# Patient Record
Sex: Male | Born: 1950
Health system: Southern US, Community
[De-identification: ages and names within clinical notes are randomized; demographics above are authoritative.]

## PROBLEM LIST (undated history)

## (undated) DIAGNOSIS — M1990 Unspecified osteoarthritis, unspecified site: Secondary | ICD-10-CM

## (undated) DIAGNOSIS — I471 Supraventricular tachycardia, unspecified: Secondary | ICD-10-CM

## (undated) DIAGNOSIS — L570 Actinic keratosis: Secondary | ICD-10-CM

## (undated) DIAGNOSIS — K219 Gastro-esophageal reflux disease without esophagitis: Secondary | ICD-10-CM

## (undated) DIAGNOSIS — E119 Type 2 diabetes mellitus without complications: Secondary | ICD-10-CM

## (undated) DIAGNOSIS — I2510 Atherosclerotic heart disease of native coronary artery without angina pectoris: Secondary | ICD-10-CM

## (undated) DIAGNOSIS — I499 Cardiac arrhythmia, unspecified: Secondary | ICD-10-CM

## (undated) DIAGNOSIS — D649 Anemia, unspecified: Secondary | ICD-10-CM

## (undated) DIAGNOSIS — E042 Nontoxic multinodular goiter: Secondary | ICD-10-CM

## (undated) DIAGNOSIS — G4730 Sleep apnea, unspecified: Secondary | ICD-10-CM

## (undated) DIAGNOSIS — I00 Rheumatic fever without heart involvement: Secondary | ICD-10-CM

## (undated) DIAGNOSIS — E041 Nontoxic single thyroid nodule: Secondary | ICD-10-CM

## (undated) DIAGNOSIS — T7840XA Allergy, unspecified, initial encounter: Secondary | ICD-10-CM

## (undated) DIAGNOSIS — C4491 Basal cell carcinoma of skin, unspecified: Secondary | ICD-10-CM

## (undated) DIAGNOSIS — T8859XA Other complications of anesthesia, initial encounter: Secondary | ICD-10-CM

## (undated) DIAGNOSIS — K760 Fatty (change of) liver, not elsewhere classified: Secondary | ICD-10-CM

## (undated) DIAGNOSIS — E785 Hyperlipidemia, unspecified: Secondary | ICD-10-CM

## (undated) DIAGNOSIS — T4145XA Adverse effect of unspecified anesthetic, initial encounter: Secondary | ICD-10-CM

## (undated) DIAGNOSIS — K819 Cholecystitis, unspecified: Secondary | ICD-10-CM

## (undated) DIAGNOSIS — C801 Malignant (primary) neoplasm, unspecified: Secondary | ICD-10-CM

## (undated) HISTORY — PX: CHOLECYSTECTOMY: SHX55

## (undated) HISTORY — PX: HERNIA REPAIR: SHX51

## (undated) HISTORY — DX: Actinic keratosis: L57.0

## (undated) HISTORY — DX: Allergy, unspecified, initial encounter: T78.40XA

## (undated) HISTORY — DX: Fatty (change of) liver, not elsewhere classified: K76.0

## (undated) HISTORY — DX: Type 2 diabetes mellitus without complications: E11.9

## (undated) HISTORY — DX: Cholecystitis, unspecified: K81.9

## (undated) HISTORY — DX: Rheumatic fever without heart involvement: I00

## (undated) HISTORY — DX: Atherosclerotic heart disease of native coronary artery without angina pectoris: I25.10

## (undated) HISTORY — DX: Sleep apnea, unspecified: G47.30

## (undated) HISTORY — DX: Gastro-esophageal reflux disease without esophagitis: K21.9

## (undated) HISTORY — DX: Hyperlipidemia, unspecified: E78.5

## (undated) HISTORY — PX: SHOULDER SURGERY: SHX246

## (undated) HISTORY — PX: SPINE SURGERY: SHX786

## (undated) HISTORY — DX: Basal cell carcinoma of skin, unspecified: C44.91

## (undated) HISTORY — DX: Nontoxic multinodular goiter: E04.2

---
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
## 1979-10-27 HISTORY — PX: VASECTOMY: SHX75

## 1995-10-27 DIAGNOSIS — G4730 Sleep apnea, unspecified: Secondary | ICD-10-CM

## 1995-10-27 HISTORY — PX: RHINOPLASTY: SUR1284

## 1995-10-27 HISTORY — DX: Sleep apnea, unspecified: G47.30

## 2013-10-26 HISTORY — PX: LUMBAR FUSION: SHX111

## 2014-10-26 HISTORY — PX: COLONOSCOPY: SHX174

## 2015-05-27 HISTORY — PX: BICEPS TENDON REPAIR: SHX566

## 2015-10-17 LAB — HM COLONOSCOPY

## 2016-05-26 ENCOUNTER — Ambulatory Visit (INDEPENDENT_AMBULATORY_CARE_PROVIDER_SITE_OTHER): Payer: BLUE CROSS/BLUE SHIELD | Admitting: Family Medicine

## 2016-05-26 ENCOUNTER — Encounter: Payer: Self-pay | Admitting: Family Medicine

## 2016-05-26 VITALS — BP 132/79 | HR 64 | Temp 98.7°F | Ht 73.0 in | Wt 227.8 lb

## 2016-05-26 DIAGNOSIS — R7989 Other specified abnormal findings of blood chemistry: Secondary | ICD-10-CM | POA: Diagnosis not present

## 2016-05-26 DIAGNOSIS — Z125 Encounter for screening for malignant neoplasm of prostate: Secondary | ICD-10-CM

## 2016-05-26 DIAGNOSIS — G4733 Obstructive sleep apnea (adult) (pediatric): Secondary | ICD-10-CM

## 2016-05-26 DIAGNOSIS — Z130 Encounter for screening for diseases of the blood and blood-forming organs and certain disorders involving the immune mechanism: Secondary | ICD-10-CM | POA: Diagnosis not present

## 2016-05-26 DIAGNOSIS — E1142 Type 2 diabetes mellitus with diabetic polyneuropathy: Secondary | ICD-10-CM | POA: Diagnosis not present

## 2016-05-26 DIAGNOSIS — E785 Hyperlipidemia, unspecified: Secondary | ICD-10-CM

## 2016-05-26 DIAGNOSIS — I471 Supraventricular tachycardia: Secondary | ICD-10-CM

## 2016-05-26 DIAGNOSIS — K219 Gastro-esophageal reflux disease without esophagitis: Secondary | ICD-10-CM

## 2016-05-26 DIAGNOSIS — K429 Umbilical hernia without obstruction or gangrene: Secondary | ICD-10-CM

## 2016-05-26 NOTE — Patient Instructions (Signed)
Continue your current medications.  Follow up in 6 months.  Take care  Dr. Lacinda Axon  Health Maintenance, Male A healthy lifestyle and preventative care can promote health and wellness.  Maintain regular health, dental, and eye exams.  Eat a healthy diet. Foods like vegetables, fruits, whole grains, low-fat dairy products, and lean protein foods contain the nutrients you need and are low in calories. Decrease your intake of foods high in solid fats, added sugars, and salt. Get information about a proper diet from your health care provider, if necessary.  Regular physical exercise is one of the most important things you can do for your health. Most adults should get at least 150 minutes of moderate-intensity exercise (any activity that increases your heart rate and causes you to sweat) each week. In addition, most adults need muscle-strengthening exercises on 2 or more days a week.   Maintain a healthy weight. The body mass index (BMI) is a screening tool to identify possible weight problems. It provides an estimate of body fat based on height and weight. Your health care provider can find your BMI and can help you achieve or maintain a healthy weight. For males 20 years and older:  A BMI below 18.5 is considered underweight.  A BMI of 18.5 to 24.9 is normal.  A BMI of 25 to 29.9 is considered overweight.  A BMI of 30 and above is considered obese.  Maintain normal blood lipids and cholesterol by exercising and minimizing your intake of saturated fat. Eat a balanced diet with plenty of fruits and vegetables. Blood tests for lipids and cholesterol should begin at age 22 and be repeated every 5 years. If your lipid or cholesterol levels are high, you are over age 87, or you are at high risk for heart disease, you may need your cholesterol levels checked more frequently.Ongoing high lipid and cholesterol levels should be treated with medicines if diet and exercise are not working.  If you  smoke, find out from your health care provider how to quit. If you do not use tobacco, do not start.  Lung cancer screening is recommended for adults aged 72-80 years who are at high risk for developing lung cancer because of a history of smoking. A yearly low-dose CT scan of the lungs is recommended for people who have at least a 30-pack-year history of smoking and are current smokers or have quit within the past 15 years. A pack year of smoking is smoking an average of 1 pack of cigarettes a day for 1 year (for example, a 30-pack-year history of smoking could mean smoking 1 pack a day for 30 years or 2 packs a day for 15 years). Yearly screening should continue until the smoker has stopped smoking for at least 15 years. Yearly screening should be stopped for people who develop a health problem that would prevent them from having lung cancer treatment.  If you choose to drink alcohol, do not have more than 2 drinks per day. One drink is considered to be 12 oz (360 mL) of beer, 5 oz (150 mL) of wine, or 1.5 oz (45 mL) of liquor.  Avoid the use of street drugs. Do not share needles with anyone. Ask for help if you need support or instructions about stopping the use of drugs.  High blood pressure causes heart disease and increases the risk of stroke. High blood pressure is more likely to develop in:  People who have blood pressure in the end of the normal range (  100-139/85-89 mm Hg).  People who are overweight or obese.  People who are African American.  If you are 76-82 years of age, have your blood pressure checked every 3-5 years. If you are 30 years of age or older, have your blood pressure checked every year. You should have your blood pressure measured twice--once when you are at a hospital or clinic, and once when you are not at a hospital or clinic. Record the average of the two measurements. To check your blood pressure when you are not at a hospital or clinic, you can use:  An automated  blood pressure machine at a pharmacy.  A home blood pressure monitor.  If you are 49-84 years old, ask your health care provider if you should take aspirin to prevent heart disease.  Diabetes screening involves taking a blood sample to check your fasting blood sugar level. This should be done once every 3 years after age 42 if you are at a normal weight and without risk factors for diabetes. Testing should be considered at a younger age or be carried out more frequently if you are overweight and have at least 1 risk factor for diabetes.  Colorectal cancer can be detected and often prevented. Most routine colorectal cancer screening begins at the age of 63 and continues through age 37. However, your health care provider may recommend screening at an earlier age if you have risk factors for colon cancer. On a yearly basis, your health care provider may provide home test kits to check for hidden blood in the stool. A small camera at the end of a tube may be used to directly examine the colon (sigmoidoscopy or colonoscopy) to detect the earliest forms of colorectal cancer. Talk to your health care provider about this at age 65 when routine screening begins. A direct exam of the colon should be repeated every 5-10 years through age 63, unless early forms of precancerous polyps or small growths are found.  People who are at an increased risk for hepatitis B should be screened for this virus. You are considered at high risk for hepatitis B if:  You were born in a country where hepatitis B occurs often. Talk with your health care provider about which countries are considered high risk.  Your parents were born in a high-risk country and you have not received a shot to protect against hepatitis B (hepatitis B vaccine).  You have HIV or AIDS.  You use needles to inject street drugs.  You live with, or have sex with, someone who has hepatitis B.  You are a man who has sex with other men (MSM).  You get  hemodialysis treatment.  You take certain medicines for conditions like cancer, organ transplantation, and autoimmune conditions.  Hepatitis C blood testing is recommended for all people born from [birth year] through 1964 and any individual with known risk factors for hepatitis C.  Healthy men should no longer receive prostate-specific antigen (PSA) blood tests as part of routine cancer screening. Talk to your health care provider about prostate cancer screening.  Testicular cancer screening is not recommended for adolescents or adult males who have no symptoms. Screening includes self-exam, a health care provider exam, and other screening tests. Consult with your health care provider about any symptoms you have or any concerns you have about testicular cancer.  Practice safe sex. Use condoms and avoid high-risk sexual practices to reduce the spread of sexually transmitted infections (STIs).  You should be screened for STIs, including  gonorrhea and chlamydia if:  You are sexually active and are younger than 24 years.  You are older than 24 years, and your health care provider tells you that you are at risk for this type of infection.  Your sexual activity has changed since you were last screened, and you are at an increased risk for chlamydia or gonorrhea. Ask your health care provider if you are at risk.  If you are at risk of being infected with HIV, it is recommended that you take a prescription medicine daily to prevent HIV infection. This is called pre-exposure prophylaxis (PrEP). You are considered at risk if:  You are a man who has sex with other men (MSM).  You are a heterosexual man who is sexually active with multiple partners.  You take drugs by injection.  You are sexually active with a partner who has HIV.  Talk with your health care provider about whether you are at high risk of being infected with HIV. If you choose to begin PrEP, you should first be tested for HIV. You should  then be tested every 3 months for as long as you are taking PrEP.  Use sunscreen. Apply sunscreen liberally and repeatedly throughout the day. You should seek shade when your shadow is shorter than you. Protect yourself by wearing long sleeves, pants, a wide-brimmed hat, and sunglasses year round whenever you are outdoors.  Tell your health care provider of new moles or changes in moles, especially if there is a change in shape or color. Also, tell your health care provider if a mole is larger than the size of a pencil eraser.  A one-time screening for abdominal aortic aneurysm (AAA) and surgical repair of large AAAs by ultrasound is recommended for men aged 15-75 years who are current or former smokers.  Stay current with your vaccines (immunizations).   This information is not intended to replace advice given to you by your health care provider. Make sure you discuss any questions you have with your health care provider.   Document Released: 04/09/2008 Document Revised: 11/02/2014 Document Reviewed: 03/09/2011 Elsevier Interactive Patient Education [employer].

## 2016-05-26 NOTE — Progress Notes (Signed)
Pre visit review using our clinic review tool, if applicable. No additional management support is needed unless otherwise documented below in the visit note. 

## 2016-05-27 ENCOUNTER — Encounter: Payer: Self-pay | Admitting: Family Medicine

## 2016-05-27 DIAGNOSIS — G4733 Obstructive sleep apnea (adult) (pediatric): Secondary | ICD-10-CM | POA: Insufficient documentation

## 2016-05-27 DIAGNOSIS — I471 Supraventricular tachycardia, unspecified: Secondary | ICD-10-CM

## 2016-05-27 DIAGNOSIS — K219 Gastro-esophageal reflux disease without esophagitis: Secondary | ICD-10-CM | POA: Insufficient documentation

## 2016-05-27 DIAGNOSIS — E1142 Type 2 diabetes mellitus with diabetic polyneuropathy: Secondary | ICD-10-CM | POA: Insufficient documentation

## 2016-05-27 DIAGNOSIS — K429 Umbilical hernia without obstruction or gangrene: Secondary | ICD-10-CM | POA: Insufficient documentation

## 2016-05-27 HISTORY — DX: Supraventricular tachycardia, unspecified: I47.10

## 2016-05-27 LAB — LIPID PANEL
Cholesterol: 206 mg/dL — ABNORMAL HIGH (ref 0–200)
HDL: 30.5 mg/dL — ABNORMAL LOW (ref >39.00)
NonHDL: 175.95
Total CHOL/HDL Ratio: 7
Triglycerides: 280 mg/dL — ABNORMAL HIGH (ref 0.0–149.0)
VLDL: 56 mg/dL — ABNORMAL HIGH (ref 0.0–40.0)

## 2016-05-27 LAB — COMPREHENSIVE METABOLIC PANEL
ALT: 26 U/L (ref 0–53)
AST: 20 U/L (ref 0–37)
Albumin: 4.3 g/dL (ref 3.5–5.2)
Alkaline Phosphatase: 46 U/L (ref 39–117)
BUN: 15 mg/dL (ref 6–23)
CO2: 28 mEq/L (ref 19–32)
Calcium: 9.7 mg/dL (ref 8.4–10.5)
Chloride: 101 mEq/L (ref 96–112)
Creatinine, Ser: 1.05 mg/dL (ref 0.40–1.50)
GFR: 75.63 mL/min (ref >60.00)
Glucose, Bld: 161 mg/dL — ABNORMAL HIGH (ref 70–99)
Potassium: 4.1 mEq/L (ref 3.5–5.1)
Sodium: 137 mEq/L (ref 135–145)
Total Bilirubin: 0.6 mg/dL (ref 0.2–1.2)
Total Protein: 6.8 g/dL (ref 6.0–8.3)

## 2016-05-27 LAB — CBC
HCT: 44.3 % (ref 39.0–52.0)
Hemoglobin: 14.9 g/dL (ref 13.0–17.0)
MCHC: 33.7 g/dL (ref 30.0–36.0)
MCV: 88.7 fl (ref 78.0–100.0)
Platelets: 209 10*3/uL (ref 150.0–400.0)
RBC: 4.99 Mil/uL (ref 4.22–5.81)
RDW: 14.1 % (ref 11.5–15.5)
WBC: 8.3 10*3/uL (ref 4.0–10.5)

## 2016-05-27 LAB — HEMOGLOBIN A1C: Hgb A1c MFr Bld: 6.7 % — ABNORMAL HIGH (ref 4.6–6.5)

## 2016-05-27 LAB — LDL CHOLESTEROL, DIRECT: Direct LDL: 159 mg/dL

## 2016-05-27 LAB — PSA: PSA: 3.41 ng/mL (ref 0.10–4.00)

## 2016-05-27 NOTE — Assessment & Plan Note (Signed)
Currently not having any issues. Stable on metoprolol. Continue.

## 2016-05-27 NOTE — Assessment & Plan Note (Signed)
Ventral and Umbilical hernias noted on exam. Offered referral to gen surgery for consideration for repair. Patient to discuss with wife and let me know.

## 2016-05-27 NOTE — Assessment & Plan Note (Signed)
Stable per report. A1c today. Continue metformin.

## 2016-05-27 NOTE — Assessment & Plan Note (Signed)
Stable on Nexium.  Continue. 

## 2016-05-27 NOTE — Assessment & Plan Note (Signed)
Stable on Provigil. Continue.

## 2016-05-27 NOTE — Progress Notes (Signed)
Subjective:  Patient ID: Jackson [name], male    DOB: 03-22-1951  Age: 65 y.o. MRN: [id number]  CC: Establish care  HPI Jackson [name] is a 65 y.o. male presents to the clinic today to establish care. Concerns/issues are below.  DM-2  Stable. Most recent A1C (4 months ago) was 7.0.  Hypoglycemia - No.  Medications - Metformin 500 mg BID.  Adverse effects - None.  Compliance - Yes.  Preventative care  Eye exam - Up to date (02/2016).  Foot exam - In need of today.  Last A1C - See above.  Urine microalbumin - Need records.   Hx of SVT  Stable on Metoprolol.   GERD  Stable on Nexium.  OSA  Did not tolerate CPAP.  Currently on Provigil and doing well.  Hernia  Patient reports ventral/umbilical hernia.   Would like me to take a look today.  Considering elective repair.  PMH, Surgical Hx, Family Hx, Social History reviewed and updated as below.  Past Medical History:  Diagnosis Date  . Allergy   . Diabetes mellitus without complication ([place])   . GERD (gastroesophageal reflux disease)   . Hyperlipidemia   . Rheumatic fever    Past Surgical History:  Procedure Laterality Date  . APPENDECTOMY  1965  . BICEPS TENDON REPAIR Right 05/2015  . LUMBAR FUSION  2015   L4/L5  . RHINOPLASTY  1997  . TONSILLECTOMY  1956  . VASECTOMY  1981   Family History  Problem Relation Age of Onset  . Arthritis Mother   . Lung cancer Mother   . Hyperlipidemia Mother   . Diabetes Mother   . Arthritis Father   . Diabetes Father   . Lung disease Father   . Hyperlipidemia Maternal Grandmother   . Alcohol abuse Maternal Grandfather   . Hyperlipidemia Maternal Grandfather   . Drug abuse Cousin    Social History  Substance Use Topics  . Smoking status: Never Smoker  . Smokeless tobacco: Former Systems developer  . Alcohol use Yes   Review of Systems  Respiratory: Positive for cough.   Gastrointestinal:       Hernia.  All other systems reviewed and are negative.  Objective:    Today's Vitals: BP 132/79 (BP Location: Left Arm, Patient Position: Sitting, Cuff Size: Normal)   Pulse 64   Temp 98.7 F (37.1 C) (Oral)   Ht '6\' 1"'  (1.854 m)   Wt 227 lb 12 oz (103.3 kg)   SpO2 95%   BMI 30.05 kg/m   Physical Exam  Constitutional: He is oriented to person, place, and time. He appears well-developed and well-nourished. No distress.  HENT:  Head: Normocephalic and atraumatic.  Nose: Nose normal.  Mouth/Throat: Oropharynx is clear and moist. No oropharyngeal exudate.  Normal TM's bilaterally.   Eyes: Conjunctivae are normal. No scleral icterus.  Neck: Neck supple.  Cardiovascular: Normal rate and regular rhythm.   No murmur heard. Pulmonary/Chest: Effort normal and breath sounds normal. He has no wheezes. He has no rales.  Abdominal: Soft. He exhibits no distension. There is no tenderness.  Ventral and umbilical hernias noted.  Musculoskeletal: Normal range of motion. He exhibits no edema.  Lymphadenopathy:    He has no cervical adenopathy.  Neurological: He is alert and oriented to person, place, and time.  Skin: Skin is warm and dry. No rash noted.  Psychiatric: He has a normal mood and affect.  Vitals reviewed.  Assessment & Plan:   Problem List Items Addressed This Visit  DM type 2 with diabetic peripheral neuropathy (HCC) - Primary    Stable per report. A1c today. Continue metformin.      Relevant Medications   metFORMIN (GLUCOPHAGE) 500 MG tablet   modafinil (PROVIGIL) 200 MG tablet   Other Relevant Orders   Comp Met (CMET)   HgB A1c   Paroxysmal SVT (supraventricular tachycardia) (HCC)    Currently not having any issues. Stable on metoprolol. Continue.      Relevant Medications   metoprolol succinate (TOPROL-XL) 50 MG 24 hr tablet   GERD (gastroesophageal reflux disease)    Stable on Nexium. Continue.       Relevant Medications   esomeprazole (NEXIUM) 40 MG capsule   OSA (obstructive sleep apnea)    Stable on Provigil.  Continue.      Umbilical hernia    Ventral and Umbilical hernias noted on exam. Offered referral to gen surgery for consideration for repair. Patient to discuss with wife and let me know.       Other Visit Diagnoses    Screening for deficiency anemia       Relevant Orders   CBC   Hyperlipidemia       Relevant Medications   metoprolol succinate (TOPROL-XL) 50 MG 24 hr tablet   Other Relevant Orders   Lipid Profile   Screening for prostate cancer       Relevant Orders   PSA      Outpatient Encounter Prescriptions as of 05/26/2016  Medication Sig  . AZELASTINE & FLUTICASONE NA Place 137 mcg into the nose as needed.  . cetirizine (ZYRTEC) 10 MG tablet Take 10 mg by mouth daily.  . Coenzyme Q10 (COQ10) 200 MG CAPS Take 200 mg by mouth.  . esomeprazole (NEXIUM) 40 MG capsule Take by mouth.  . metFORMIN (GLUCOPHAGE) 500 MG tablet Take by mouth.  . metoprolol succinate (TOPROL-XL) 50 MG 24 hr tablet Take by mouth.  . modafinil (PROVIGIL) 200 MG tablet Take 200 mg by mouth as needed.  . Multiple Vitamins-Minerals (MULTIVITAMIN ADULT PO) Take by mouth.  Marland Kitchen OVER THE COUNTER MEDICATION Osteo biflex  . vitamin C (ASCORBIC ACID) 500 MG tablet Take 500 mg by mouth daily.  . vitamin E 100 UNIT capsule Take 500 Units by mouth daily.  . [DISCONTINUED] AZELASTINE HCL OP Apply 137 mcg to eye.   No facility-administered encounter medications on file as of 05/26/2016.     Follow-up: TBD based on Lab results.  [place]

## 2016-06-19 ENCOUNTER — Encounter: Payer: Self-pay | Admitting: Family Medicine

## 2016-06-20 ENCOUNTER — Other Ambulatory Visit: Payer: Self-pay | Admitting: Family Medicine

## 2016-06-20 DIAGNOSIS — K429 Umbilical hernia without obstruction or gangrene: Secondary | ICD-10-CM

## 2016-06-22 ENCOUNTER — Encounter: Payer: Self-pay | Admitting: General Surgery

## 2016-07-06 ENCOUNTER — Other Ambulatory Visit: Payer: Self-pay | Admitting: Family Medicine

## 2016-07-06 ENCOUNTER — Ambulatory Visit (INDEPENDENT_AMBULATORY_CARE_PROVIDER_SITE_OTHER): Payer: BLUE CROSS/BLUE SHIELD | Admitting: General Surgery

## 2016-07-06 ENCOUNTER — Encounter: Payer: Self-pay | Admitting: General Surgery

## 2016-07-06 ENCOUNTER — Encounter: Payer: Self-pay | Admitting: Family Medicine

## 2016-07-06 VITALS — BP 130/72 | HR 74 | Resp 12 | Ht 73.0 in | Wt 234.0 lb

## 2016-07-06 DIAGNOSIS — M6208 Separation of muscle (nontraumatic), other site: Secondary | ICD-10-CM | POA: Insufficient documentation

## 2016-07-06 DIAGNOSIS — K439 Ventral hernia without obstruction or gangrene: Secondary | ICD-10-CM | POA: Insufficient documentation

## 2016-07-06 DIAGNOSIS — K429 Umbilical hernia without obstruction or gangrene: Secondary | ICD-10-CM

## 2016-07-06 MED ORDER — MODAFINIL 200 MG PO TABS: 200.0000 mg | ORAL_TABLET | Freq: Every day | ORAL | 0 refills | Status: DC

## 2016-07-06 NOTE — Patient Instructions (Addendum)
Hernia, Adult A hernia is the bulging of an organ or tissue through a weak spot in the muscles of the abdomen (abdominal wall). Hernias develop most often near the navel or groin. There are many kinds of hernias. Common kinds include:  Femoral hernia. This kind of hernia develops under the groin in the upper thigh area.  Inguinal hernia. This kind of hernia develops in the groin or scrotum.  Umbilical hernia. This kind of hernia develops near the navel.  Hiatal hernia. This kind of hernia causes part of the stomach to be pushed up into the chest.  Incisional hernia. This kind of hernia bulges through a scar from an abdominal surgery. CAUSES This condition may be caused by:  Heavy lifting.  Coughing over a long period of time.  Straining to have a bowel movement.  An incision made during an abdominal surgery.  A birth defect (congenital defect).  Excess weight or obesity.  Smoking.  Poor nutrition.  Cystic fibrosis.  Excess fluid in the abdomen.  Undescended testicles. SYMPTOMS Symptoms of a hernia include:  A lump on the abdomen. This is the first sign of a hernia. The lump may become more obvious with standing, straining, or coughing. It may get bigger over time if it is not treated or if the condition causing it is not treated.  Pain. A hernia is usually painless, but it may become painful over time if treatment is delayed. The pain is usually dull and may get worse with standing or lifting heavy objects. Sometimes a hernia gets tightly squeezed in the weak spot (strangulated) or stuck there (incarcerated) and causes additional symptoms. These symptoms may include:  Vomiting.  Nausea.  Constipation.  Irritability. DIAGNOSIS A hernia may be diagnosed with:  A physical exam. During the exam your health care provider may ask you to cough or to make a specific movement, because a hernia is usually more visible when you move.  Imaging tests. These can  include:  X-rays.  Ultrasound.  CT scan. TREATMENT A hernia that is small and painless may not need to be treated. A hernia that is large or painful may be treated with surgery. Inguinal hernias may be treated with surgery to prevent incarceration or strangulation. Strangulated hernias are always treated with surgery, because lack of blood to the trapped organ or tissue can cause it to die. Surgery to treat a hernia involves pushing the bulge back into place and repairing the weak part of the abdomen. HOME CARE INSTRUCTIONS  Avoid straining.  Do not lift anything heavier than 10 lb (4.5 kg).  Lift with your leg muscles, not your back muscles. This helps avoid strain.  When coughing, try to cough gently.  Prevent constipation. Constipation leads to straining with bowel movements, which can make a hernia worse or cause a hernia repair to break down. You can prevent constipation by:  Eating a high-fiber diet that includes plenty of fruits and vegetables.  Drinking enough fluids to keep your urine clear or pale yellow. Aim to drink 6-8 glasses of water per day.  Using a stool softener as directed by your health care provider.  Lose weight, if you are overweight.  Do not use any tobacco products, including cigarettes, chewing tobacco, or electronic cigarettes. If you need help quitting, ask your health care provider.  Keep all follow-up visits as directed by your health care provider. This is important. Your health care provider may need to monitor your condition. SEEK MEDICAL CARE IF:  You have   swelling, redness, and pain in the affected area.  Your bowel habits change. SEEK IMMEDIATE MEDICAL CARE IF:  You have a fever.  You have abdominal pain that is getting worse.  You feel nauseous or you vomit.  You cannot push the hernia back in place by gently pressing on it while you are lying down.  The hernia:  Changes in shape or size.  Is stuck outside the  abdomen.  Becomes discolored.  Feels hard or tender.   This information is not intended to replace advice given to you by your health care provider. Make sure you discuss any questions you have with your health care provider.   Document Released: 10/12/2005 Document Revised: 11/02/2014 Document Reviewed: 08/22/2014 Elsevier Interactive Patient Education [employer].  The patient is scheduled for surgery at [facility] on 07/30/16. He will pre admit by phone. The patient is aware of date and instructions.

## 2016-07-06 NOTE — Telephone Encounter (Signed)
Historical medication. Pt last seen 05/26/16. please advise?

## 2016-07-06 NOTE — Progress Notes (Signed)
Patient ID: Jackson [name], male   DOB: 1950/10/28, 65 y.o.   MRN: [id number]  Chief Complaint  Patient presents with  . Umbilical Hernia    HPI Jackson [name] is a 65 y.o. male here today for a evaluation of a umbilical hernia. Patient states he noticed this area about five or more years. No pain or change in size. Moves his bowels daily. Wife Elsie Amis present.  HPI  Past Medical History:  Diagnosis Date  . Allergy   . Diabetes mellitus without complication ([place])   . GERD (gastroesophageal reflux disease)   . Hyperlipidemia   . Rheumatic fever   . Sleep apnea 1997   Uses C-Pap machine    Past Surgical History:  Procedure Laterality Date  . APPENDECTOMY  1965  . BICEPS TENDON REPAIR Right 05/2015  . COLONOSCOPY  2016  . LUMBAR FUSION  2015   L4/L5  . RHINOPLASTY  1997  . TONSILLECTOMY  1956  . VASECTOMY  1981    Family History  Problem Relation Age of Onset  . Arthritis Mother   . Lung cancer Mother   . Hyperlipidemia Mother   . Diabetes Mother   . Arthritis Father   . Diabetes Father   . Lung disease Father   . Hyperlipidemia Maternal Grandmother   . Alcohol abuse Maternal Grandfather   . Hyperlipidemia Maternal Grandfather   . Drug abuse Cousin     Social History Social History  Substance Use Topics  . Smoking status: Never Smoker  . Smokeless tobacco: Former Systems developer  . Alcohol use Yes    No Known Allergies  Current Outpatient Prescriptions  Medication Sig Dispense Refill  . AZELASTINE & FLUTICASONE NA Place 137 mcg into the nose as needed.    . cetirizine (ZYRTEC) 10 MG tablet Take 10 mg by mouth daily.    . Coenzyme Q10 (COQ10) 200 MG CAPS Take 200 mg by mouth.    . esomeprazole (NEXIUM) 40 MG capsule Take by mouth.    . metFORMIN (GLUCOPHAGE) 500 MG tablet Take by mouth.    . metoprolol succinate (TOPROL-XL) 50 MG 24 hr tablet Take by mouth.    . modafinil (PROVIGIL) 200 MG tablet Take 200 mg by mouth as needed.    . Multiple Vitamins-Minerals  (MULTIVITAMIN ADULT PO) Take by mouth.    Marland Kitchen OVER THE COUNTER MEDICATION Osteo biflex    . vitamin C (ASCORBIC ACID) 500 MG tablet Take 500 mg by mouth daily.    . vitamin E 100 UNIT capsule Take 500 Units by mouth daily.     No current facility-administered medications for this visit.     Review of Systems Review of Systems  Constitutional: Negative.   Respiratory: Negative.   Cardiovascular: Negative.     Blood pressure 130/72, pulse 74, resp. rate 12, height 6\' 1"  (1.854 m), weight 234 lb (106.1 kg).  Physical Exam Physical Exam  Constitutional: He is oriented to person, place, and time. He appears well-developed and well-nourished.  Eyes: Conjunctivae are normal. No scleral icterus.  Neck: Neck supple.  Cardiovascular: Normal rate, regular rhythm and normal heart sounds.   Pulmonary/Chest: Effort normal and breath sounds normal.  Abdominal: Soft. Normal appearance and bowel sounds are normal. There is no hepatomegaly. There is no tenderness. A hernia ( ) is present.    3 cm soft tissue mass located 10 cm above the umbilicus.   Lymphadenopathy:    He has no cervical adenopathy.  Neurological: He is alert and  oriented to person, place, and time.  Skin: Skin is warm and dry.    Data Reviewed PCP notes of 05/26/2016.  Assessment    Umbilical hernia.  Epigastric hernia versus lipoma.  Diastases recti.    Plan    No indication for repair of diastases.   Hernia precautions and incarceration were discussed with the patient. If they develop symptoms of an incarcerated hernia, they were encouraged to seek prompt medical attention. I have recommended repair of the hernias on an outpatient basis in the near future. The risk of infection was reviewed. The role of prosthetic mesh to minimize the risk of recurrence was reviewed, anticipated only if a significantly larger defect is identified.   The patient is scheduled for surgery at [facility] on 07/30/16. He will pre admit by  phone. The patient is aware of date and instructions.   This information has been scribed by Gaspar Cola CMA.   Robert Bellow 07/06/2016, 4:52 PM

## 2016-07-07 NOTE — Telephone Encounter (Signed)
Faxed

## 2016-07-23 ENCOUNTER — Encounter
Admission: RE | Admit: 2016-07-23 | Discharge: 2016-07-23 | Disposition: A | Discharge status: Home / Self Care | Payer: BLUE CROSS/BLUE SHIELD | Source: Ambulatory Visit | Attending: General Surgery | Admitting: General Surgery

## 2016-07-23 HISTORY — DX: Other complications of anesthesia, initial encounter: T88.59XA

## 2016-07-23 HISTORY — DX: Anemia, unspecified: D64.9

## 2016-07-23 HISTORY — DX: Supraventricular tachycardia: I47.1

## 2016-07-23 HISTORY — DX: Adverse effect of unspecified anesthetic, initial encounter: T41.45XA

## 2016-07-23 HISTORY — DX: Supraventricular tachycardia, unspecified: I47.10

## 2016-07-23 HISTORY — DX: Nontoxic single thyroid nodule: E04.1

## 2016-07-23 HISTORY — DX: Malignant (primary) neoplasm, unspecified: C80.1

## 2016-07-23 HISTORY — DX: Cardiac arrhythmia, unspecified: I49.9

## 2016-07-23 HISTORY — DX: Unspecified osteoarthritis, unspecified site: M19.90

## 2016-07-23 NOTE — Patient Instructions (Signed)
  Your procedure is scheduled on: 07-30-16 (THURSDAY) Report to Same Day Surgery 2nd floor medical mall To find out your arrival time please call [phone] between 1PM - 3PM on 07-29-16 [facility])  Remember: Instructions that are not followed completely may result in serious medical risk, up to and including death, or upon the discretion of your surgeon and anesthesiologist your surgery may need to be rescheduled.    _x___ 1. Do not eat food or drink liquids after midnight. No gum chewing or hard candies.     __x__ 2. No Alcohol for 24 hours before or after surgery.   __x__3. No Smoking for 24 prior to surgery.   ____  4. Bring all medications with you on the day of surgery if instructed.    __x__ 5. Notify your doctor if there is any change in your medical condition     (cold, fever, infections).     Do not wear jewelry, make-up, hairpins, clips or nail polish.  Do not wear lotions, powders, or perfumes. You may wear deodorant.  Do not shave 48 hours prior to surgery. Men may shave face and neck.  Do not bring valuables to the hospital.    [facility] is not responsible for any belongings or valuables.               Contacts, dentures or bridgework may not be worn into surgery.  Leave your suitcase in the car. After surgery it may be brought to your room.  For patients admitted to the hospital, discharge time is determined by your treatment team.   Patients discharged the day of surgery will not be allowed to drive home.    Please read over the following fact sheets that you were given:   [facility] Preparing for Surgery and or MRSA Information   _x___ Take these medicines the morning of surgery with A SIP OF WATER:    1. TOPROL  2. [place]  3.  4.  5.  6.  ____Fleets enema or Magnesium Citrate as directed.   _x___ Use CHG Soap or sage wipes as directed on instruction sheet   ____ Use inhalers on the day of surgery and bring to hospital day of surgery  _X___ Stop  metformin 2 days prior to surgery-LAST DOSE ON Monday, October 2ND    ____ Take 1/2 of usual insulin dose the night before surgery and none on the morning of surgery.   ____ Stop aspirin or coumadin, or plavix  __ Stop Anti-inflammatories such as Advil, Aleve, Ibuprofen, Motrin, Naproxen,          Naprosyn, Goodies powders or aspirin products.Ok to take Tylenol.   _X___ Stop supplements until after surgery-STOP FISH OIL, CINNAMON, OSTEO-BI FLEX, VITAMIN C, E, AND CO Q 10 NOW  _X___ Bring C-Pap to the hospital.

## 2016-07-24 ENCOUNTER — Ambulatory Visit
Admission: RE | Admit: 2016-07-24 | Discharge: 2016-07-24 | Disposition: A | Discharge status: Home / Self Care | Payer: BLUE CROSS/BLUE SHIELD | Source: Ambulatory Visit | Attending: General Surgery | Admitting: General Surgery

## 2016-07-24 DIAGNOSIS — R001 Bradycardia, unspecified: Secondary | ICD-10-CM | POA: Insufficient documentation

## 2016-07-24 DIAGNOSIS — I471 Supraventricular tachycardia: Secondary | ICD-10-CM | POA: Diagnosis not present

## 2016-07-24 DIAGNOSIS — R9431 Abnormal electrocardiogram [ECG] [EKG]: Secondary | ICD-10-CM | POA: Insufficient documentation

## 2016-07-24 DIAGNOSIS — Z01818 Encounter for other preprocedural examination: Secondary | ICD-10-CM | POA: Insufficient documentation

## 2016-07-24 NOTE — Pre-Procedure Instructions (Signed)
CALLED DR Kayleen Memos REGARDING PTS ABNORMAL EKG-INFORMED DR Kayleen Memos THAT PT JUST MOVED FROM [place] AND WAS SEEING CARDIOLOGIST IN [place] FOR HIS SVT-PT HAS NOT ESTABLISHED CARE WITH ANYONE HERE YET AND NOW PT HAS ABNORMAL EKG-INFORMED DR Kayleen Memos THAT PT STATES HE ALWAYS HAS ABNORMAL EKG WITH  INVERTED T WAVES. DR Park Meo PT TO CARDIAC CLEARANCE. CALLED CARYL-LYN AT DR BYRNETT'S OFFICE AND INFORMED HER OF THIS. I TOLD HER THAT SHE WILL HAVE TO SET CLEARANCE UP ON THIS PT SINCE HE HAS NEVER SEEN A CARDIOLOGIST BEFORE.  CARYL-LYN VERBALIZED SHE WOULD.FAXED THIS OVER TO DR BYRNETTS OFFICE.

## 2016-07-24 NOTE — Pre-Procedure Instructions (Signed)
RECEIVED FAX CONFIRMATION THAT CLEARANCE AND EKG WENT THRU TO DR Dwyane Luo OFFICE

## 2016-07-27 ENCOUNTER — Telehealth: Payer: Self-pay | Admitting: General Surgery

## 2016-07-27 NOTE — Telephone Encounter (Signed)
PT CAME INTO [facility] TODAY & STATED HE WANTED TO CANCEL HIS HERNIA SX [facility] FOR Thursday 07-30-16 WITH DR BYRNETT & DIDN'T WANT TO RESCHEDULE AT THIS TIME.

## 2016-07-27 NOTE — Telephone Encounter (Signed)
Patient is getting set up with cardiology as he has a history of SVT. He may reschedule once he has had all his testing and is clear for surgery.

## 2016-07-30 ENCOUNTER — Ambulatory Visit
Admission: RE | Admit: 2016-07-30 | Payer: BLUE CROSS/BLUE SHIELD | Source: Ambulatory Visit | Admitting: General Surgery

## 2016-07-30 ENCOUNTER — Encounter: Admission: RE | Payer: Self-pay | Source: Ambulatory Visit

## 2016-07-30 SURGERY — REPAIR, HERNIA, UMBILICAL, ADULT
Anesthesia: Choice

## 2016-08-03 ENCOUNTER — Encounter: Payer: Self-pay | Admitting: Family Medicine

## 2016-08-04 ENCOUNTER — Encounter: Payer: Self-pay | Admitting: Family Medicine

## 2016-08-04 LAB — HM DIABETES EYE EXAM

## 2016-09-03 ENCOUNTER — Encounter: Payer: Self-pay | Admitting: Family Medicine

## 2016-09-04 ENCOUNTER — Other Ambulatory Visit: Payer: Self-pay | Admitting: Family Medicine

## 2016-09-04 DIAGNOSIS — L821 Other seborrheic keratosis: Secondary | ICD-10-CM

## 2016-09-06 ENCOUNTER — Encounter: Payer: Self-pay | Admitting: Family Medicine

## 2016-09-07 ENCOUNTER — Encounter: Payer: Self-pay | Admitting: Family Medicine

## 2016-09-07 ENCOUNTER — Ambulatory Visit (INDEPENDENT_AMBULATORY_CARE_PROVIDER_SITE_OTHER): Payer: BLUE CROSS/BLUE SHIELD | Admitting: Family Medicine

## 2016-09-07 VITALS — BP 134/88 | HR 62 | Temp 98.3°F | Resp 16 | Wt 236.4 lb

## 2016-09-07 DIAGNOSIS — B9789 Other viral agents as the cause of diseases classified elsewhere: Secondary | ICD-10-CM

## 2016-09-07 DIAGNOSIS — J069 Acute upper respiratory infection, unspecified: Secondary | ICD-10-CM | POA: Insufficient documentation

## 2016-09-07 LAB — POCT RAPID STREP A (OFFICE): Rapid Strep A Screen: NEGATIVE

## 2016-09-07 NOTE — Progress Notes (Signed)
Pre visit review using our clinic review tool, if applicable. No additional management support is needed unless otherwise documented below in the visit note. 

## 2016-09-07 NOTE — Progress Notes (Signed)
   Subjective:  Patient ID: Jackson [name], male    DOB: 09/23/1951  Age: 65 y.o. MRN: [id number]  CC: Cough, congestion, Sore Throat  HPI:  65 year old male presents for an acute visit with the above complaints.  Patient reports that he's not been feeling well since Saturday. He's been experiencing cough, congestion, sore throat, and right ear pain. He took some Mucinex recently without improvement. No associated fevers or chills. No known exacerbating or relieving factors. No other associated symptoms. No other complaints at this time.  Social Hx   Social History   Social History  . Marital status: Married    Spouse name: N/A  . Number of children: N/A  . Years of education: N/A   Social History Main Topics  . Smoking status: Never Smoker  . Smokeless tobacco: Former Systems developer  . Alcohol use Yes     Comment: OCC  . Drug use: No  . Sexual activity: Not Currently   Other Topics Concern  . None   Social History Narrative  . None    Review of Systems  HENT: Positive for congestion, ear pain and sore throat.   Respiratory: Positive for cough.    Objective:  BP 134/88 (BP Location: Right Arm, Patient Position: Sitting, Cuff Size: Normal)   Pulse 62   Temp 98.3 F (36.8 C) (Oral)   Resp 16   Wt 236 lb 6 oz (107.2 kg)   SpO2 96%   BMI 31.19 kg/m   BP/Weight 09/07/2016 07/23/2016 0000000  Systolic BP Q000111Q - [id number]  Diastolic BP 88 - 72  Wt. (Lbs) 236.38 225 234  BMI 31.19 29.69 30.87   Physical Exam  Constitutional: He is oriented to person, place, and time. He appears well-developed. No distress.  HENT:  Oropharynx with mild erythema. Normal TMs bilaterally.  Cardiovascular: Normal rate and regular rhythm.   Pulmonary/Chest: Effort normal and breath sounds normal.  Neurological: He is alert and oriented to person, place, and time.  Psychiatric: He has a normal mood and affect.  Vitals reviewed.  Lab Results  Component Value Date   WBC 8.3 05/26/2016   HGB 14.9  05/26/2016   HCT 44.3 05/26/2016   PLT 209.0 05/26/2016   GLUCOSE 161 (H) 05/26/2016   CHOL 206 (H) 05/26/2016   TRIG 280.0 (H) 05/26/2016   HDL 30.50 (L) 05/26/2016   LDLDIRECT 159.0 05/26/2016   ALT 26 05/26/2016   AST 20 05/26/2016   NA 137 05/26/2016   K 4.1 05/26/2016   CL 101 05/26/2016   CREATININE 1.05 05/26/2016   BUN 15 05/26/2016   CO2 28 05/26/2016   PSA 3.41 05/26/2016   HGBA1C 6.7 (H) 05/26/2016   Assessment & Plan:   Problem List Items Addressed This Visit    Viral URI with cough - Primary    New acute problem. Appears to be viral in origin. No indication for antibiotics at this time. Advised supportive care and over-the-counter Mucinex, Tylenol, Flonase, Delsym.      Relevant Orders   Culture, Group A Strep   POCT rapid strep A (Completed)     Follow-up: PRN  [place]

## 2016-09-07 NOTE — Assessment & Plan Note (Signed)
New acute problem. Appears to be viral in origin. No indication for antibiotics at this time. Advised supportive care and over-the-counter Mucinex, Tylenol, Flonase, Delsym.

## 2016-09-07 NOTE — Patient Instructions (Signed)
This is viral.   Use mucinex, tylenol, flonase.  Rest and hydrate.  Follow up if you fail to improve or worsen.  Take care  Dr. Lacinda Axon

## 2016-09-09 LAB — CULTURE, GROUP A STREP: Organism ID, Bacteria: NORMAL

## 2016-11-26 ENCOUNTER — Encounter: Payer: Self-pay | Admitting: Family Medicine

## 2016-11-26 ENCOUNTER — Ambulatory Visit (INDEPENDENT_AMBULATORY_CARE_PROVIDER_SITE_OTHER): Admitting: Family Medicine

## 2016-11-26 VITALS — BP 126/83 | HR 65 | Temp 98.5°F | Wt 237.8 lb

## 2016-11-26 DIAGNOSIS — E1142 Type 2 diabetes mellitus with diabetic polyneuropathy: Secondary | ICD-10-CM

## 2016-11-26 DIAGNOSIS — E1169 Type 2 diabetes mellitus with other specified complication: Secondary | ICD-10-CM | POA: Insufficient documentation

## 2016-11-26 DIAGNOSIS — G4733 Obstructive sleep apnea (adult) (pediatric): Secondary | ICD-10-CM

## 2016-11-26 DIAGNOSIS — E785 Hyperlipidemia, unspecified: Secondary | ICD-10-CM

## 2016-11-26 LAB — LIPID PANEL
Cholesterol: 210 mg/dL — ABNORMAL HIGH (ref 0–200)
HDL: 27.4 mg/dL — ABNORMAL LOW (ref >39.00)
LDL Cholesterol: 148 mg/dL — ABNORMAL HIGH (ref 0–99)
NonHDL: 182.98
Total CHOL/HDL Ratio: 8
Triglycerides: 173 mg/dL — ABNORMAL HIGH (ref 0.0–149.0)
VLDL: 34.6 mg/dL (ref 0.0–40.0)

## 2016-11-26 LAB — HEMOGLOBIN A1C: Hgb A1c MFr Bld: 7.1 % — ABNORMAL HIGH (ref 4.6–6.5)

## 2016-11-26 MED ORDER — METFORMIN HCL 500 MG PO TABS: 500.0000 mg | ORAL_TABLET | Freq: Two times a day (BID) | ORAL | 3 refills | Status: DC

## 2016-11-26 MED ORDER — MODAFINIL 200 MG PO TABS: 200.0000 mg | ORAL_TABLET | Freq: Every day | ORAL | 0 refills | Status: DC

## 2016-11-26 MED ORDER — AZELASTINE-FLUTICASONE 137-50 MCG/ACT NA SUSP: 1.0000 | Freq: Two times a day (BID) | NASAL | 1 refills | Status: DC

## 2016-11-26 MED ORDER — ESOMEPRAZOLE MAGNESIUM 40 MG PO CPDR: 40.0000 mg | DELAYED_RELEASE_CAPSULE | Freq: Every evening | ORAL | 3 refills | Status: DC

## 2016-11-26 MED ORDER — METOPROLOL SUCCINATE ER 50 MG PO TB24: 50.0000 mg | ORAL_TABLET | Freq: Two times a day (BID) | ORAL | 3 refills | Status: DC

## 2016-11-26 NOTE — Assessment & Plan Note (Signed)
Stable. A1C today. Continue metformin. 

## 2016-11-26 NOTE — Assessment & Plan Note (Signed)
Stable on Provigil. Refilled today.

## 2016-11-26 NOTE — Assessment & Plan Note (Signed)
Uncontrolled. Patient has not tolerate statin. Patient states that he was on Zetia which I prescribed and that he had a repeat lipid panel. I am not aware of this repeat lab study. Will reassess today with lipid panel.

## 2016-11-26 NOTE — Progress Notes (Signed)
Subjective:  Patient ID: Jackson [name], male    DOB: [birth year]  Age: 66 y.o. MRN: [id number]  CC: Follow up  HPI:  66 year old male with DM 2, paroxysmal SVT, GERD, OSA, hyperlipidemia presents for follow-up.  DM-2  Stable.   Sugars well controlled on Metformin.  Hyperlipidemia  Uncontrolled and untreated.   Has not tolerated statin.  Was on Zetia and no longer taking.  OSA  Stable on Provigil.  Social Hx   Social History   Social History  . Marital status: Married    Spouse name: N/A  . Number of children: N/A  . Years of education: N/A   Social History Main Topics  . Smoking status: Never Smoker  . Smokeless tobacco: Former Systems developer  . Alcohol use Yes     Comment: OCC  . Drug use: No  . Sexual activity: Not Currently   Other Topics Concern  . None   Social History Narrative  . None   Review of Systems  Constitutional: Negative.   Respiratory: Negative.   Cardiovascular: Negative.    Objective:  BP 126/83   Pulse 65   Temp 98.5 F (36.9 C) (Oral)   Wt 237 lb 12.8 oz (107.9 kg)   SpO2 93%   BMI 31.37 kg/m   BP/Weight 11/26/2016 09/07/2016 [id number]  Systolic BP 123XX123 Q000111Q -  Diastolic BP 83 88 -  Wt. (Lbs) 237.8 236.38 225  BMI 31.37 31.19 29.69   Physical Exam  Constitutional: He is oriented to person, place, and time. He appears well-developed. No distress.  Cardiovascular: Normal rate and regular rhythm.   Pulmonary/Chest: Effort normal and breath sounds normal.  Neurological: He is alert and oriented to person, place, and time.  Psychiatric: He has a normal mood and affect.  Vitals reviewed.  Lab Results  Component Value Date   WBC 8.3 05/26/2016   HGB 14.9 05/26/2016   HCT 44.3 05/26/2016   PLT 209.0 05/26/2016   GLUCOSE 161 (H) 05/26/2016   CHOL 206 (H) 05/26/2016   TRIG 280.0 (H) 05/26/2016   HDL 30.50 (L) 05/26/2016   LDLDIRECT 159.0 05/26/2016   ALT 26 05/26/2016   AST 20 05/26/2016   NA 137 05/26/2016   K 4.1  05/26/2016   CL 101 05/26/2016   CREATININE 1.05 05/26/2016   BUN 15 05/26/2016   CO2 28 05/26/2016   PSA 3.41 05/26/2016   HGBA1C 6.7 (H) 05/26/2016    Assessment & Plan:   Problem List Items Addressed This Visit    DM type 2 with diabetic peripheral neuropathy ([place]) - Primary    Stable. A1C today. Continue metformin.      Relevant Medications   metFORMIN (GLUCOPHAGE) 500 MG tablet   modafinil (PROVIGIL) 200 MG tablet   Other Relevant Orders   Hemoglobin A1c   OSA (obstructive sleep apnea)    Stable on Provigil. Refilled today.      Hyperlipidemia    Uncontrolled. Patient has not tolerate statin. Patient states that he was on Zetia which I prescribed and that he had a repeat lipid panel. I am not aware of this repeat lab study. Will reassess today with lipid panel.      Relevant Medications   metoprolol succinate (TOPROL-XL) 50 MG 24 hr tablet   Other Relevant Orders   Lipid panel      Meds ordered this encounter  Medications  . Azelastine-Fluticasone 137-50 MCG/ACT SUSP    Sig: Place 1 spray into the nose 2 (two) times daily.  Dispense:  23 g    Refill:  1  . esomeprazole (NEXIUM) 40 MG capsule    Sig: Take 1 capsule (40 mg total) by mouth every evening.    Dispense:  90 capsule    Refill:  3  . metFORMIN (GLUCOPHAGE) 500 MG tablet    Sig: Take 1 tablet (500 mg total) by mouth 2 (two) times daily with a meal.    Dispense:  180 tablet    Refill:  3  . metoprolol succinate (TOPROL-XL) 50 MG 24 hr tablet    Sig: Take 1 tablet (50 mg total) by mouth 2 (two) times daily.    Dispense:  180 tablet    Refill:  3  . modafinil (PROVIGIL) 200 MG tablet    Sig: Take 1 tablet (200 mg total) by mouth daily.    Dispense:  90 tablet    Refill:  0    Follow-up: Return in about 6 months (around 05/26/2017).  [place]

## 2016-11-26 NOTE — Patient Instructions (Signed)
Continue your meds.  Follow up in 6 months.  Take care  Dr. Krystan Northrop  

## 2016-12-03 ENCOUNTER — Telehealth: Payer: Self-pay | Admitting: Family Medicine

## 2016-12-03 NOTE — Telephone Encounter (Signed)
A PA was done for pts nexium and dymista.

## 2016-12-03 NOTE — Telephone Encounter (Signed)
nexium was approved until 10/25/2098.

## 2016-12-06 ENCOUNTER — Encounter: Payer: Self-pay | Admitting: Family Medicine

## 2016-12-09 ENCOUNTER — Encounter: Payer: Self-pay | Admitting: Family Medicine

## 2016-12-09 NOTE — Telephone Encounter (Signed)
Dymista will not be approved via Pa paper request

## 2016-12-10 ENCOUNTER — Telehealth: Payer: Self-pay | Admitting: Family Medicine

## 2016-12-10 ENCOUNTER — Other Ambulatory Visit: Payer: Self-pay | Admitting: Family Medicine

## 2016-12-10 MED ORDER — METFORMIN HCL 1000 MG PO TABS: 1000.0000 mg | ORAL_TABLET | Freq: Two times a day (BID) | ORAL | 0 refills | Status: DC

## 2016-12-10 MED ORDER — METFORMIN HCL 1000 MG PO TABS: 1000.0000 mg | ORAL_TABLET | Freq: Two times a day (BID) | ORAL | 2 refills | Status: DC

## 2016-12-10 NOTE — Telephone Encounter (Signed)
Pt called and medication had to be resent to the corrected pharmacy.

## 2016-12-10 NOTE — Telephone Encounter (Signed)
Pt sent a MyChart message stating that he was okay with increase to 1000 mg BID of metformin. New rx will be sent to reflect this.

## 2016-12-10 NOTE — Telephone Encounter (Signed)
Pt called stating he needs a short order of the metFORMIN (GLUCOPHAGE) 1000 MG tablet until he gets the medication that was sent in today. Pt is out of the medication.  Pharmacy is [id number] - [place], [place]  Call pt @ [phone]. Thank you!

## 2016-12-10 NOTE — Addendum Note (Signed)
Addended by: Carmin Muskrat on: 12/10/2016 01:10 PM   Modules accepted: Orders

## 2016-12-30 ENCOUNTER — Encounter: Payer: Self-pay | Admitting: Family Medicine

## 2016-12-31 ENCOUNTER — Other Ambulatory Visit: Payer: Self-pay | Admitting: Family Medicine

## 2016-12-31 MED ORDER — AZELASTINE HCL 0.1 % NA SOLN: 1.0000 | Freq: Two times a day (BID) | NASAL | 12 refills | Status: DC

## 2016-12-31 MED ORDER — OMEPRAZOLE 40 MG PO CPDR: 40.0000 mg | DELAYED_RELEASE_CAPSULE | Freq: Every day | ORAL | 1 refills | Status: DC

## 2017-01-05 ENCOUNTER — Other Ambulatory Visit: Payer: Self-pay | Admitting: Family Medicine

## 2017-01-05 MED ORDER — OSELTAMIVIR PHOSPHATE 75 MG PO CAPS: 75.0000 mg | ORAL_CAPSULE | Freq: Every day | ORAL | 0 refills | Status: DC

## 2017-05-26 ENCOUNTER — Ambulatory Visit (INDEPENDENT_AMBULATORY_CARE_PROVIDER_SITE_OTHER): Admitting: Family Medicine

## 2017-05-26 ENCOUNTER — Encounter: Payer: Self-pay | Admitting: Family Medicine

## 2017-05-26 VITALS — BP 112/70 | HR 53 | Temp 98.9°F | Resp 12 | Wt 232.1 lb

## 2017-05-26 DIAGNOSIS — Z130 Encounter for screening for diseases of the blood and blood-forming organs and certain disorders involving the immune mechanism: Secondary | ICD-10-CM

## 2017-05-26 DIAGNOSIS — E1142 Type 2 diabetes mellitus with diabetic polyneuropathy: Secondary | ICD-10-CM | POA: Diagnosis not present

## 2017-05-26 DIAGNOSIS — K219 Gastro-esophageal reflux disease without esophagitis: Secondary | ICD-10-CM | POA: Diagnosis not present

## 2017-05-26 DIAGNOSIS — E785 Hyperlipidemia, unspecified: Secondary | ICD-10-CM

## 2017-05-26 DIAGNOSIS — Z125 Encounter for screening for malignant neoplasm of prostate: Secondary | ICD-10-CM | POA: Diagnosis not present

## 2017-05-26 LAB — PSA: PSA: 3.01 ng/mL (ref 0.10–4.00)

## 2017-05-26 LAB — CBC
HCT: 46.6 % (ref 39.0–52.0)
Hemoglobin: 15.6 g/dL (ref 13.0–17.0)
MCHC: 33.5 g/dL (ref 30.0–36.0)
MCV: 91.9 fl (ref 78.0–100.0)
Platelets: 217 10*3/uL (ref 150.0–400.0)
RBC: 5.07 Mil/uL (ref 4.22–5.81)
RDW: 13.6 % (ref 11.5–15.5)
WBC: 7.9 10*3/uL (ref 4.0–10.5)

## 2017-05-26 LAB — COMPREHENSIVE METABOLIC PANEL
ALT: 34 U/L (ref 0–53)
AST: 23 U/L (ref 0–37)
Albumin: 4.3 g/dL (ref 3.5–5.2)
Alkaline Phosphatase: 44 U/L (ref 39–117)
BUN: 16 mg/dL (ref 6–23)
CO2: 29 mEq/L (ref 19–32)
Calcium: 9.4 mg/dL (ref 8.4–10.5)
Chloride: 102 mEq/L (ref 96–112)
Creatinine, Ser: 1.12 mg/dL (ref 0.40–1.50)
GFR: 69.98 mL/min (ref >60.00)
Glucose, Bld: 132 mg/dL — ABNORMAL HIGH (ref 70–99)
Potassium: 4.8 mEq/L (ref 3.5–5.1)
Sodium: 138 mEq/L (ref 135–145)
Total Bilirubin: 0.7 mg/dL (ref 0.2–1.2)
Total Protein: 6.5 g/dL (ref 6.0–8.3)

## 2017-05-26 LAB — POCT URINALYSIS DIPSTICK
Bilirubin, UA: NEGATIVE
Blood, UA: NEGATIVE
Glucose, UA: NEGATIVE
Ketones, UA: NEGATIVE
Leukocytes, UA: NEGATIVE
Nitrite, UA: NEGATIVE
Protein, UA: NEGATIVE
Spec Grav, UA: 1.02 (ref 1.010–1.025)
Urobilinogen, UA: 0.2 E.U./dL
pH, UA: 5.5 (ref 5.0–8.0)

## 2017-05-26 LAB — MICROALBUMIN / CREATININE URINE RATIO
Creatinine,U: 137.5 mg/dL
Microalb Creat Ratio: 0.4 mg/g (ref 0.0–30.0)
Microalb, Ur: 0.6 mg/dL (ref 0.0–1.9)

## 2017-05-26 LAB — HEMOGLOBIN A1C: Hgb A1c MFr Bld: 6.8 % — ABNORMAL HIGH (ref 4.6–6.5)

## 2017-05-26 MED ORDER — OMEPRAZOLE 40 MG PO CPDR: 40.0000 mg | DELAYED_RELEASE_CAPSULE | Freq: Every day | ORAL | 1 refills | Status: DC

## 2017-05-26 MED ORDER — AZELASTINE HCL 0.1 % NA SOLN: 1.0000 | Freq: Two times a day (BID) | NASAL | 12 refills | Status: DC

## 2017-05-26 NOTE — Assessment & Plan Note (Signed)
Stable. Refilling omeprazole.

## 2017-05-26 NOTE — Assessment & Plan Note (Signed)
Uncontrolled. Lipid panel today. Patient cannot tolerate statin. Discussed restarting Zetia. Patient to consider. Patient states that he's not worried about his cholesterol.

## 2017-05-26 NOTE — Progress Notes (Signed)
Subjective:  Patient ID: Jackson [name], male    DOB: Jun 01, 1951  Age: 66 y.o. MRN: [id number]  CC: Follow up  HPI:  66 year old male with OSA, GERD, DM 2, hyperlipidemia, history of paroxysmal SVT presents for follow-up.  DM-2  Stable.  Sugars ranging from 110's - 130, with some outliers.  Compliant with metformin.  No adverse side effects.  Hyperlipidemia  Uncontrolled. Cannot tolerate statin secondary to myalgia.  Needs lipid panel today.  GERD  Stable on PPI. Needs refill on omeprazole.  Social Hx   Social History   Social History  . Marital status: Married    Spouse name: N/A  . Number of children: N/A  . Years of education: N/A   Social History Main Topics  . Smoking status: Never Smoker  . Smokeless tobacco: Former Systems developer  . Alcohol use Yes     Comment: OCC  . Drug use: No  . Sexual activity: Not Currently   Other Topics Concern  . None   Social History Narrative  . None    Review of Systems  Respiratory: Negative.   Cardiovascular: Negative.    Objective:  BP 112/70 (BP Location: Left Arm, Patient Position: Sitting, Cuff Size: Normal)   Pulse (!) 53   Temp 98.9 F (37.2 C) (Oral)   Resp 12   Wt 232 lb 2 oz (105.3 kg)   SpO2 95%   BMI 30.63 kg/m   BP/Weight 05/26/2017 11/26/2016 54/27/0623  Systolic BP 762 831 517  Diastolic BP 70 83 88  Wt. (Lbs) 232.13 237.8 236.38  BMI 30.63 31.37 31.19    Physical Exam  Constitutional: He is oriented to person, place, and time. He appears well-developed. No distress.  Cardiovascular: Normal rate and regular rhythm.   Pulmonary/Chest: Effort normal and breath sounds normal. He has no wheezes. He has no rales.  Abdominal: Soft. He exhibits no distension. There is no tenderness.  Neurological: He is alert and oriented to person, place, and time.  Psychiatric: He has a normal mood and affect.  Vitals reviewed.   Lab Results  Component Value Date   WBC 8.3 05/26/2016   HGB 14.9 05/26/2016   HCT 44.3 05/26/2016   PLT 209.0 05/26/2016   GLUCOSE 161 (H) 05/26/2016   CHOL 210 (H) 11/26/2016   TRIG 173.0 (H) 11/26/2016   HDL 27.40 (L) 11/26/2016   LDLDIRECT 159.0 05/26/2016   LDLCALC 148 (H) 11/26/2016   ALT 26 05/26/2016   AST 20 05/26/2016   NA 137 05/26/2016   K 4.1 05/26/2016   CL 101 05/26/2016   CREATININE 1.05 05/26/2016   BUN 15 05/26/2016   CO2 28 05/26/2016   PSA 3.41 05/26/2016   HGBA1C 7.1 (H) 11/26/2016    Assessment & Plan:   Problem List Items Addressed This Visit      Digestive   GERD (gastroesophageal reflux disease)    Stable. Refilling omeprazole.      Relevant Medications   omeprazole (PRILOSEC) 40 MG capsule     Endocrine   DM type 2 with diabetic peripheral neuropathy ([place]) - Primary    Stable. A1c today. Continue metformin.      Relevant Orders   Hemoglobin A1c   Comprehensive metabolic panel   Microalbumin / creatinine urine ratio   POCT Urinalysis Dipstick (Completed)     Other   Hyperlipidemia    Uncontrolled. Lipid panel today. Patient cannot tolerate statin. Discussed restarting Zetia. Patient to consider. Patient states that he's not worried about his  cholesterol.       Other Visit Diagnoses    Screening for deficiency anemia       Relevant Orders   CBC   Prostate cancer screening       Relevant Orders   PSA      Meds ordered this encounter  Medications  . azelastine (ASTELIN) 0.1 % nasal spray    Sig: Place 1 spray into both nostrils 2 (two) times daily. Use in each nostril as directed    Dispense:  30 mL    Refill:  12  . omeprazole (PRILOSEC) 40 MG capsule    Sig: Take 1 capsule (40 mg total) by mouth daily.    Dispense:  90 capsule    Refill:  1     Follow-up: 6 months  [place]

## 2017-05-26 NOTE — Patient Instructions (Signed)
Continue your meds.  Follow up in 6 months.  Take care  Dr. Murl Zogg  

## 2017-05-26 NOTE — Assessment & Plan Note (Signed)
Stable. A1c today. Continue metformin.

## 2017-05-27 ENCOUNTER — Ambulatory Visit: Admitting: Family Medicine

## 2017-08-20 ENCOUNTER — Other Ambulatory Visit: Payer: Self-pay | Admitting: Family Medicine

## 2017-10-20 LAB — HM DIABETES EYE EXAM

## 2017-10-22 ENCOUNTER — Encounter: Payer: Self-pay | Admitting: Family Medicine

## 2018-01-07 ENCOUNTER — Other Ambulatory Visit: Payer: Self-pay | Admitting: Family Medicine

## 2018-01-10 NOTE — Telephone Encounter (Signed)
23-month refill sent to pharmacy.  Please contact him and get him set up to establish care with Dr. Terese Door if he is willing as I am no longer accepting transfers from Dr. Lacinda Axon.

## 2018-01-10 NOTE — Telephone Encounter (Signed)
Last seen in August by Dr. Lacinda Axon. No future has been made to transfer care since.

## 2018-01-11 ENCOUNTER — Encounter: Payer: Self-pay | Admitting: *Deleted

## 2018-01-11 NOTE — Telephone Encounter (Signed)
Pt has appt on 01/20/18 with Dr. Terese Door

## 2018-01-20 ENCOUNTER — Encounter: Payer: Self-pay | Admitting: Internal Medicine

## 2018-01-20 ENCOUNTER — Ambulatory Visit (INDEPENDENT_AMBULATORY_CARE_PROVIDER_SITE_OTHER): Payer: Medicare Other | Admitting: Internal Medicine

## 2018-01-20 VITALS — BP 126/70 | HR 59 | Temp 98.2°F | Ht 73.0 in | Wt 236.4 lb

## 2018-01-20 DIAGNOSIS — E559 Vitamin D deficiency, unspecified: Secondary | ICD-10-CM

## 2018-01-20 DIAGNOSIS — Z23 Encounter for immunization: Secondary | ICD-10-CM

## 2018-01-20 DIAGNOSIS — G4733 Obstructive sleep apnea (adult) (pediatric): Secondary | ICD-10-CM | POA: Diagnosis not present

## 2018-01-20 DIAGNOSIS — Z136 Encounter for screening for cardiovascular disorders: Secondary | ICD-10-CM | POA: Diagnosis not present

## 2018-01-20 DIAGNOSIS — E119 Type 2 diabetes mellitus without complications: Secondary | ICD-10-CM

## 2018-01-20 DIAGNOSIS — Z13818 Encounter for screening for other digestive system disorders: Secondary | ICD-10-CM | POA: Diagnosis not present

## 2018-01-20 DIAGNOSIS — E041 Nontoxic single thyroid nodule: Secondary | ICD-10-CM | POA: Insufficient documentation

## 2018-01-20 DIAGNOSIS — I471 Supraventricular tachycardia: Secondary | ICD-10-CM

## 2018-01-20 DIAGNOSIS — N400 Enlarged prostate without lower urinary tract symptoms: Secondary | ICD-10-CM | POA: Diagnosis not present

## 2018-01-20 DIAGNOSIS — Z1159 Encounter for screening for other viral diseases: Secondary | ICD-10-CM

## 2018-01-20 DIAGNOSIS — R5383 Other fatigue: Secondary | ICD-10-CM

## 2018-01-20 DIAGNOSIS — E1142 Type 2 diabetes mellitus with diabetic polyneuropathy: Secondary | ICD-10-CM

## 2018-01-20 DIAGNOSIS — R972 Elevated prostate specific antigen [PSA]: Secondary | ICD-10-CM

## 2018-01-20 DIAGNOSIS — Z0184 Encounter for antibody response examination: Secondary | ICD-10-CM | POA: Diagnosis not present

## 2018-01-20 LAB — COMPREHENSIVE METABOLIC PANEL
ALT: 40 U/L (ref 0–53)
AST: 25 U/L (ref 0–37)
Albumin: 4.2 g/dL (ref 3.5–5.2)
Alkaline Phosphatase: 44 U/L (ref 39–117)
BUN: 13 mg/dL (ref 6–23)
CO2: 31 mEq/L (ref 19–32)
Calcium: 9.6 mg/dL (ref 8.4–10.5)
Chloride: 101 mEq/L (ref 96–112)
Creatinine, Ser: 1.1 mg/dL (ref 0.40–1.50)
GFR: 71.31 mL/min (ref >60.00)
Glucose, Bld: 170 mg/dL — ABNORMAL HIGH (ref 70–99)
Potassium: 4.6 mEq/L (ref 3.5–5.1)
Sodium: 139 mEq/L (ref 135–145)
Total Bilirubin: 0.5 mg/dL (ref 0.2–1.2)
Total Protein: 7.2 g/dL (ref 6.0–8.3)

## 2018-01-20 LAB — CBC WITH DIFFERENTIAL/PLATELET
Basophils Absolute: 0 10*3/uL (ref 0.0–0.1)
Basophils Relative: 0.6 % (ref 0.0–3.0)
Eosinophils Absolute: 0.2 10*3/uL (ref 0.0–0.7)
Eosinophils Relative: 2.6 % (ref 0.0–5.0)
HCT: 43.5 % (ref 39.0–52.0)
Hemoglobin: 14.5 g/dL (ref 13.0–17.0)
Lymphocytes Relative: 33.6 % (ref 12.0–46.0)
Lymphs Abs: 2.6 10*3/uL (ref 0.7–4.0)
MCHC: 33.4 g/dL (ref 30.0–36.0)
MCV: 84.8 fl (ref 78.0–100.0)
Monocytes Absolute: 0.5 10*3/uL (ref 0.1–1.0)
Monocytes Relative: 6.7 % (ref 3.0–12.0)
Neutro Abs: 4.3 10*3/uL (ref 1.4–7.7)
Neutrophils Relative %: 56.5 % (ref 43.0–77.0)
Platelets: 228 10*3/uL (ref 150.0–400.0)
RBC: 5.13 Mil/uL (ref 4.22–5.81)
RDW: 13.7 % (ref 11.5–15.5)
WBC: 7.6 10*3/uL (ref 4.0–10.5)

## 2018-01-20 LAB — TSH: TSH: 1.13 u[IU]/mL (ref 0.35–4.50)

## 2018-01-20 LAB — VITAMIN D 25 HYDROXY (VIT D DEFICIENCY, FRACTURES): VITD: 31.33 ng/mL (ref 30.00–100.00)

## 2018-01-20 LAB — T4, FREE: Free T4: 0.66 ng/dL (ref 0.60–1.60)

## 2018-01-20 LAB — HEMOGLOBIN A1C: Hgb A1c MFr Bld: 7.1 % — ABNORMAL HIGH (ref 4.6–6.5)

## 2018-01-20 MED ORDER — METOPROLOL SUCCINATE ER 25 MG PO TB24: 25.0000 mg | ORAL_TABLET | Freq: Every day | ORAL | 3 refills | Status: DC

## 2018-01-20 NOTE — Progress Notes (Signed)
Pre visit review using our clinic review tool, if applicable. No additional management support is needed unless otherwise documented below in the visit note. 

## 2018-01-20 NOTE — Patient Instructions (Addendum)
We will refer you to Urology  Ultrasound of abdomen  Labs today  F/u in 6 weeks  Reduce Metoprolol to 25 mg daily   CT ab/pelvis 2008   Prostatic enlargement with a low density lesion in the right lobe of  the prostate and ultrasound is recommended.There is a probable  hemostasis clip at the tip of the cecum.There is fatty infiltration  of the liver and a questionable small cyst in the right kidney.The  pericardium may be slightly thickened.   Report was faxed to the Emergency Room at the time of interpretation  09/30/2007 at 1835 hours. Fatigue Fatigue is feeling tired all of the time, a lack of energy, or a lack of motivation. Occasional or mild fatigue is often a normal response to activity or life in general. However, long-lasting (chronic) or extreme fatigue may indicate an underlying medical condition. Follow these instructions at home: Watch your fatigue for any changes. The following actions may help to lessen any discomfort you are feeling:  Talk to your health care provider about how much sleep you need each night. Try to get the required amount every night.  Take medicines only as directed by your health care provider.  Eat a healthy and nutritious diet. Ask your health care provider if you need help changing your diet.  Drink enough fluid to keep your urine clear or pale yellow.  Practice ways of relaxing, such as yoga, meditation, massage therapy, or acupuncture.  Exercise regularly.  Change situations that cause you stress. Try to keep your work and personal routine reasonable.  Do not abuse illegal drugs.  Limit alcohol intake to no more than 1 drink per day for nonpregnant women and 2 drinks per day for men. One drink equals 12 ounces of beer, 5 ounces of wine, or 1 ounces of hard liquor.  Take a multivitamin, if directed by your health care provider.  Contact a health care provider if:  Your fatigue does not get better.  You have a fever.  You  have unintentional weight loss or gain.  You have headaches.  You have difficulty: ? Falling asleep. ? Sleeping throughout the night.  You feel angry, guilty, anxious, or sad.  You are unable to have a bowel movement (constipation).  You skin is dry.  Your legs or another part of your body is swollen. Get help right away if:  You feel confused.  Your vision is blurry.  You feel faint or pass out.  You have a severe headache.  You have severe abdominal, pelvic, or back pain.  You have chest pain, shortness of breath, or an irregular or fast heartbeat.  You are unable to urinate or you urinate less than normal.  You develop abnormal bleeding, such as bleeding from the rectum, vagina, nose, lungs, or nipples.  You vomit blood.  You have thoughts about harming yourself or committing suicide.  You are worried that you might harm someone else. This information is not intended to replace advice given to you by your health care provider. Make sure you discuss any questions you have with your health care provider. Document Released: 08/09/2007 Document Revised: 03/19/2016 Document Reviewed: 02/13/2014 Elsevier Interactive Patient Education  [employer].

## 2018-01-21 LAB — HEPATITIS C ANTIBODY
Hepatitis C Ab: NONREACTIVE
SIGNAL TO CUT-OFF: 0.02 (ref <1.00)

## 2018-01-21 LAB — HEPATITIS B SURFACE ANTIBODY, QUANTITATIVE: Hepatitis B-Post: <5 m[IU]/mL — ABNORMAL LOW (ref >=10)

## 2018-01-21 LAB — HEPATITIS B SURFACE ANTIGEN: Hepatitis B Surface Ag: NONREACTIVE

## 2018-01-23 ENCOUNTER — Encounter: Payer: Self-pay | Admitting: Internal Medicine

## 2018-01-23 DIAGNOSIS — E119 Type 2 diabetes mellitus without complications: Secondary | ICD-10-CM | POA: Insufficient documentation

## 2018-01-23 DIAGNOSIS — N400 Enlarged prostate without lower urinary tract symptoms: Secondary | ICD-10-CM | POA: Insufficient documentation

## 2018-01-23 DIAGNOSIS — E118 Type 2 diabetes mellitus with unspecified complications: Secondary | ICD-10-CM | POA: Insufficient documentation

## 2018-01-23 DIAGNOSIS — R5383 Other fatigue: Secondary | ICD-10-CM | POA: Insufficient documentation

## 2018-01-23 NOTE — Progress Notes (Signed)
Chief Complaint  Patient presents with  . Follow-up   Follow up  1. C/o fatigue-"running out of gas". He is sleeing 8-10 hrs at night, he is using bipap x 5 years for OSA, he does report h/o thyroid nodules.  He was on provigil for narcolepsy but is not taking. At one time he was on Adderrall for narcolepsy but not taking b/c wife states if makes him mean and h/o SVT.  Pulse is 59 today on Metoprolol XL 50 mg qd  He also reports one if his siblings had fatigue and had his gallbladder out and several family members have had to have GB removed but not for gallstones per pt   Of note reviewed CT 09/2007 in care everywhere. Prostate enlarged with low density lesion in right low pt unaware of this report   FINDINGS: Scans through the lung bases show the lung bases are clear.The heart may be mildly enlarged and there is slight thickening of the  pericardium.There is fatty infiltration of the liver.The  gallbladder is unremarkable.The spleen is within normal limits and  the pancreas is unremarkable.Adrenal glands are within normal  limits.There is a small low density lesion in the mid left kidney  which is probably a small cyst and ultrasound is recommended for  confirmation.The left kidney is unremarkable.The abdominal aorta  and periaortic structures are within normal limits.The stomach is  distended with fluid.There is no free intraperitoneal air.Small  bowel loops are unremarkable.There is a density near the cecum which may be hemostasis clip from previous surgery.The appendix is not  identified, but there are no inflammatory changes seen.The colon is  unremarkable.   The urinary bladder is unremarkable.The prostate is slightly  enlarged and there are small calcifications in the prostate and there  is a low density lesion in the right side of the prostate which  measures 13.0 mm in diameter.Prostatic ultrasound may be helpful in  further  evaluation.Rectosigmoid is unremarkable.Vascular  structures are unremarkable.   CONCLUSION:  Prostatic enlargement with a low density lesion in the right lobe of  the prostate and ultrasound is recommended.There is a probable  hemostasis clip at the tip of the cecum.There is fatty infiltration  of the liver and a questionable small cyst in the right kidney.The  pericardium may be slightly thickened.   2.h/o SVT since 2008 had episode while riding a bike   3. H/o thyroid nodules per pt  4. DM 2 on Metformin 1000 bid   Review of Systems  Constitutional: Positive for malaise/fatigue. Negative for weight loss.  HENT: Negative for hearing loss.   Eyes: Negative for blurred vision.  Respiratory: Negative for shortness of breath.   Cardiovascular: Negative for chest pain and palpitations.  Gastrointestinal: Negative for abdominal pain.  Genitourinary:       No BPH sx's   Musculoskeletal: Negative for falls.  Skin: Negative for rash.  Neurological: Negative for headaches.  Psychiatric/Behavioral: Negative for depression.   Past Medical History:  Diagnosis Date  . Allergy   . Anemia   . Arthritis   . Cancer (HCC)    BASAL CELL-FACE  . Complication of anesthesia    PT IS A RED HEAD AND STATES IT TAKES ALOT MORE ANESTHESIA TO SEDATE HIM  . Diabetes mellitus without complication ([place])   . Dysrhythmia    SVT  . GERD (gastroesophageal reflux disease)   . Hyperlipidemia   . Rheumatic fever   . Sleep apnea 1997   Uses C-Pap machine  . SVT (  supraventricular tachycardia) ([place])   . Thyroid nodule    Past Surgical History:  Procedure Laterality Date  . APPENDECTOMY  1965  . BICEPS TENDON REPAIR Right 05/2015  . COLONOSCOPY  2016  . LUMBAR FUSION  2015   L4/L5  . RHINOPLASTY  1997  . TONSILLECTOMY  1956  . VASECTOMY  1981   Family History  Problem Relation Age of Onset  . Arthritis Mother   . Lung cancer Mother   . Hyperlipidemia Mother   . Diabetes Mother   .  Arthritis Father   . Diabetes Father   . Lung disease Father   . Hyperlipidemia Maternal Grandmother   . Alcohol abuse Maternal Grandfather   . Hyperlipidemia Maternal Grandfather   . Drug abuse Cousin    Social History   Socioeconomic History  . Marital status: Married    Spouse name: Not on file  . Number of children: Not on file  . Years of education: Not on file  . Highest education level: Not on file  Occupational History  . Not on file  Social Needs  . Financial resource strain: Not on file  . Food insecurity:    Worry: Not on file    Inability: Not on file  . Transportation needs:    Medical: Not on file    Non-medical: Not on file  Tobacco Use  . Smoking status: Never Smoker  . Smokeless tobacco: Former Network engineer and Sexual Activity  . Alcohol use: Yes    Comment: OCC  . Drug use: No  . Sexual activity: Not Currently  Lifestyle  . Physical activity:    Days per week: Not on file    Minutes per session: Not on file  . Stress: Not on file  Relationships  . Social connections:    Talks on phone: Not on file    Gets together: Not on file    Attends religious service: Not on file    Active member of club or organization: Not on file    Attends meetings of clubs or organizations: Not on file    Relationship status: Not on file  . Intimate partner violence:    Fear of current or ex partner: Not on file    Emotionally abused: Not on file    Physically abused: Not on file    Forced sexual activity: Not on file  Other Topics Concern  . Not on file  Social History Narrative   Married    Former Nature conservation officer    Current Meds  Medication Sig  . azelastine (ASTELIN) 0.1 % nasal spray Place 1 spray into both nostrils 2 (two) times daily. Use in each nostril as directed  . cetirizine (ZYRTEC) 10 MG tablet Take 10 mg by mouth at bedtime.   . Cinnamon 500 MG capsule Take 500 mg by mouth daily.  . Coenzyme Q10 (COQ10) 200 MG CAPS Take 200 mg by mouth.  Marland Kitchen  guaiFENesin-codeine 100-10 MG/5ML syrup Take by mouth.  . metFORMIN (GLUCOPHAGE) 1000 MG tablet Take 1 tablet (1,000 mg total) by mouth 2 (two) times daily with a meal. .  . metoprolol succinate (TOPROL-XL) 25 MG 24 hr tablet Take 1 tablet (25 mg total) by mouth daily. Take with or immediately following a meal.  . Multiple Vitamins-Minerals (MULTIVITAMIN ADULT PO) Take by mouth.  . Omega-3 Fatty Acids (FISH OIL) 1200 MG CPDR Take 1 tablet by mouth daily.  Marland Kitchen omeprazole (PRILOSEC) 40 MG capsule Take 1 capsule (40 mg total)  by mouth daily.  Marland Kitchen OVER THE COUNTER MEDICATION Osteo biflex  . vitamin C (ASCORBIC ACID) 500 MG tablet Take 500 mg by mouth daily.  . vitamin E 100 UNIT capsule Take 500 Units by mouth daily.  . [DISCONTINUED] metoprolol succinate (TOPROL-XL) 50 MG 24 hr tablet TAKE 1 TABLET TWICE A DAY  . [DISCONTINUED] modafinil (PROVIGIL) 200 MG tablet Take 1 tablet (200 mg total) by mouth daily.   Allergies  Allergen Reactions  . Statins Other (See Comments)    Myalgias   Recent Results (from the past 2160 hour(s))  Comprehensive metabolic panel     Status: Abnormal   Collection Time: 01/20/18  2:36 PM  Result Value Ref Range   Sodium 139 135 - 145 mEq/L   Potassium 4.6 3.5 - 5.1 mEq/L   Chloride 101 96 - 112 mEq/L   CO2 31 19 - 32 mEq/L   Glucose, Bld 170 (H) 70 - 99 mg/dL   BUN 13 6 - 23 mg/dL   Creatinine, Ser 1.10 0.40 - 1.50 mg/dL   Total Bilirubin 0.5 0.2 - 1.2 mg/dL   Alkaline Phosphatase 44 39 - 117 U/L   AST 25 0 - 37 U/L   ALT 40 0 - 53 U/L   Total Protein 7.2 6.0 - 8.3 g/dL   Albumin 4.2 3.5 - 5.2 g/dL   Calcium 9.6 8.4 - 10.5 mg/dL   GFR 71.31 >60.00 mL/min  CBC with Differential/Platelet     Status: None   Collection Time: 01/20/18  2:36 PM  Result Value Ref Range   WBC 7.6 4.0 - 10.5 K/uL   RBC 5.13 4.22 - 5.81 Mil/uL   Hemoglobin 14.5 13.0 - 17.0 g/dL   HCT 43.5 39.0 - 52.0 %   MCV 84.8 78.0 - 100.0 fl   MCHC 33.4 30.0 - 36.0 g/dL   RDW 13.7 11.5 - 15.5  %   Platelets 228.0 150.0 - 400.0 K/uL   Neutrophils Relative % 56.5 43.0 - 77.0 %   Lymphocytes Relative 33.6 12.0 - 46.0 %   Monocytes Relative 6.7 3.0 - 12.0 %   Eosinophils Relative 2.6 0.0 - 5.0 %   Basophils Relative 0.6 0.0 - 3.0 %   Neutro Abs 4.3 1.4 - 7.7 K/uL   Lymphs Abs 2.6 0.7 - 4.0 K/uL   Monocytes Absolute 0.5 0.1 - 1.0 K/uL   Eosinophils Absolute 0.2 0.0 - 0.7 K/uL   Basophils Absolute 0.0 0.0 - 0.1 K/uL  T4, free     Status: None   Collection Time: 01/20/18  2:36 PM  Result Value Ref Range   Free T4 0.66 0.60 - 1.60 ng/dL    Comment: Specimens from patients who are undergoing biotin therapy and /or ingesting biotin supplements may contain high levels of biotin.  The higher biotin concentration in these specimens interferes with this Free T4 assay.  Specimens that contain high levels  of biotin may cause false high results for this Free T4 assay.  Please interpret results in light of the total clinical presentation of the patient.    TSH     Status: None   Collection Time: 01/20/18  2:36 PM  Result Value Ref Range   TSH 1.13 0.35 - 4.50 uIU/mL  Vitamin D (25 hydroxy)     Status: None   Collection Time: 01/20/18  2:36 PM  Result Value Ref Range   VITD 31.33 30.00 - 100.00 ng/mL  Hemoglobin A1c     Status: Abnormal   Collection  Time: 01/20/18  2:36 PM  Result Value Ref Range   Hgb A1c MFr Bld 7.1 (H) 4.6 - 6.5 %    Comment: Glycemic Control Guidelines for People with Diabetes:Non Diabetic:  <6%Goal of Therapy: <7%Additional Action Suggested:  >8%   Hepatitis C antibody     Status: None   Collection Time: 01/20/18  2:36 PM  Result Value Ref Range   Hepatitis C Ab NON-REACTIVE NON-REACTI   SIGNAL TO CUT-OFF 0.02 <1.00    Comment: . HCV antibody was non-reactive. There is no laboratory  evidence of HCV infection. . In most cases, no further action is required. However, if recent HCV exposure is suspected, a test for HCV RNA (test code [phone]) is  suggested. . For additional information please refer to http://education.[facility].com/faq/FAQ22v1 (This link is being provided for informational/ educational purposes only.) .   Hepatitis B surface antibody     Status: Abnormal   Collection Time: 01/20/18  2:36 PM  Result Value Ref Range   Hepatitis B-Post <5 (L) > OR = 10 mIU/mL    Comment: . Patient does not have immunity to hepatitis B virus. . For additional information, please refer to http://education.[facility].com/faq/FAQ105 (This link is being provided for informational/ educational purposes only).   Hepatitis B surface antigen     Status: None   Collection Time: 01/20/18  2:36 PM  Result Value Ref Range   Hepatitis B Surface Ag NON-REACTIVE NON-REACTI   Objective  Body mass index is 31.19 kg/m. Wt Readings from Last 3 Encounters:  01/20/18 236 lb 6.4 oz (107.2 kg)  05/26/17 232 lb 2 oz (105.3 kg)  11/26/16 237 lb 12.8 oz (107.9 kg)   Temp Readings from Last 3 Encounters:  01/20/18 98.2 F (36.8 C) (Oral)  05/26/17 98.9 F (37.2 C) (Oral)  11/26/16 98.5 F (36.9 C) (Oral)   BP Readings from Last 3 Encounters:  01/20/18 126/70  05/26/17 112/70  11/26/16 126/83   Pulse Readings from Last 3 Encounters:  01/20/18 (!) 59  05/26/17 (!) 53  11/26/16 65    Physical Exam  Constitutional: He is oriented to person, place, and time and well-developed, well-nourished, and in no distress. Vital signs are normal.  HENT:  Head: Normocephalic and atraumatic.  Mouth/Throat: Oropharynx is clear and moist and mucous membranes are normal.  Eyes: Pupils are equal, round, and reactive to light. Conjunctivae are normal.  Cardiovascular: Regular rhythm and normal heart sounds. Bradycardia present.  Pulmonary/Chest: Effort normal and breath sounds normal.  Abdominal: Soft. Bowel sounds are normal. There is no tenderness.  Neurological: He is alert and oriented to person, place, and time. Gait normal. Gait  normal.  Skin: Skin is warm, dry and intact.  Psychiatric: Mood, memory, affect and judgment normal.  Nursing note and vitals reviewed.   Assessment   1. Fatigue  2. BPH with low density right lobe lesion in prostate noted 09/2007 CT report. PSA had 3.01 05/26/17  3. H/o thyroid nodules  4. H/o SVT now bradycardic  5. OSA on bipap ,h/o narcolepsy  6. DM 2 A1C 7.1 01/20/18  7. HM Plan  1.  F/u in 4-6 weeks  Check CMET, CBC, TSH, T4, A1C, vit D, hep B/C today  Check US abdomen consider repeat CT chest in future  Referred to urology to w/u prostate cancer see #2  2. See HPI this had never been worked up  Referred to urology to further w/u pt not aware of report form 09/2017 reviewed with him today  3. Check TSH, T4 today  Consider thyroid US in future  4. Reduce toprol XL from 50 to 25 qd  5. Off Adderral and Provigil currently for narcolepsy  Cont bipap on x 5 years consider repeat sleep study in future  6. On metformin 1000 mg bid  Check lipid, UA, urine protein in future (urine labs due 05/2018)  Had eye exam AE 11/2017 need to get records  Need to do foot exam in future  Not on statin, allergic to statin  Not on ACEI/ARB consider in future per DM guidelines  7.  Had flu shot 07/2017  Consider Tdap in future  Given prevnar today will need pna 23 in 1 year  Consider shingrix in future   Check hep B (rec hep B vaccine/hep C neg) PSA 05/26/17 3.01  Need to get colonoscopy records fro m2016 last PCP Dr. [facility] [place] per pt no h/o polyps.   US abdomen screen AAA as well as eval GB FH GB dz not for gallstones  ?etiology   Provider: Dr. Olivia Mackie [name]

## 2018-01-24 ENCOUNTER — Other Ambulatory Visit: Payer: Self-pay | Admitting: Internal Medicine

## 2018-01-24 DIAGNOSIS — E119 Type 2 diabetes mellitus without complications: Secondary | ICD-10-CM

## 2018-01-24 MED ORDER — SITAGLIPTIN PHOSPHATE 50 MG PO TABS: 50.0000 mg | ORAL_TABLET | Freq: Every day | ORAL | 3 refills | Status: DC

## 2018-01-27 ENCOUNTER — Ambulatory Visit
Admission: RE | Admit: 2018-01-27 | Discharge: 2018-01-27 | Disposition: A | Discharge status: Home / Self Care | Payer: Medicare Other | Source: Ambulatory Visit | Attending: Internal Medicine | Admitting: Internal Medicine

## 2018-01-27 DIAGNOSIS — N281 Cyst of kidney, acquired: Secondary | ICD-10-CM | POA: Diagnosis not present

## 2018-01-27 DIAGNOSIS — K760 Fatty (change of) liver, not elsewhere classified: Secondary | ICD-10-CM | POA: Diagnosis not present

## 2018-01-27 DIAGNOSIS — Z136 Encounter for screening for cardiovascular disorders: Secondary | ICD-10-CM | POA: Insufficient documentation

## 2018-01-27 DIAGNOSIS — R5383 Other fatigue: Secondary | ICD-10-CM | POA: Diagnosis not present

## 2018-01-27 IMAGING — US US ABDOMEN COMPLETE
1 series · 13 of 25 positions shown · non-contrast
Comparison: No prior.

CLINICAL DATA: Fatigue.  Diabetes.  Abdominal pain.

EXAM:
ABDOMEN ULTRASOUND COMPLETE

[Series 1: us abdomen complete · 13 of 106 slices shown]
[im 1/106]
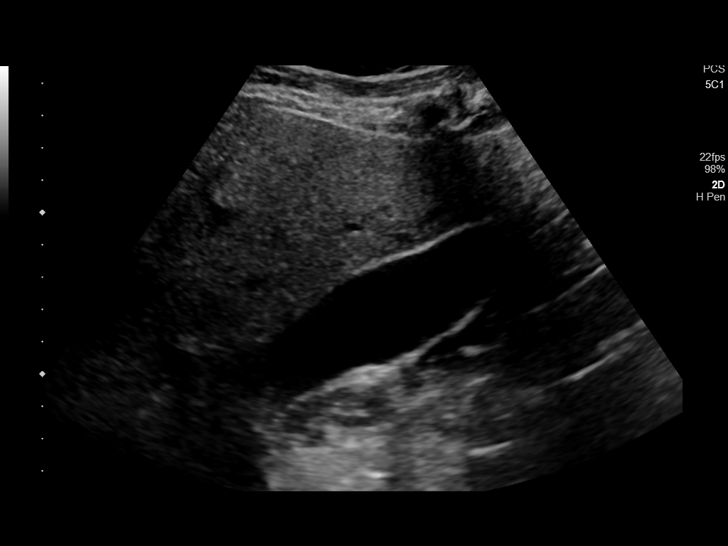
[im 9/106]
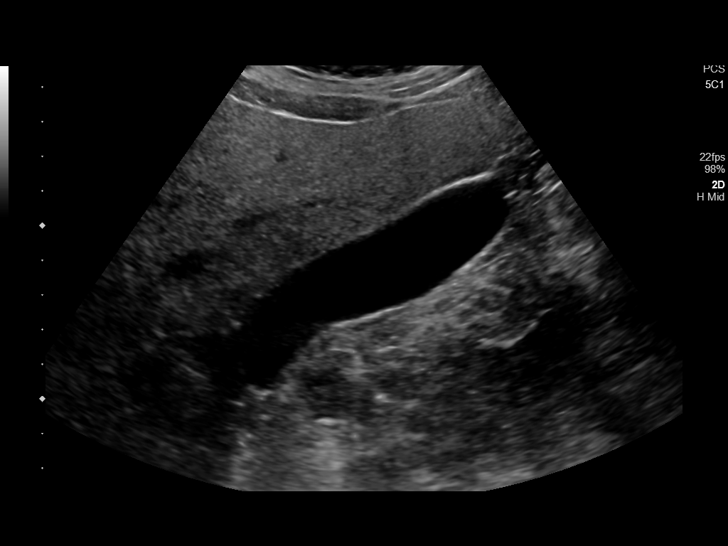
[im 18/106]
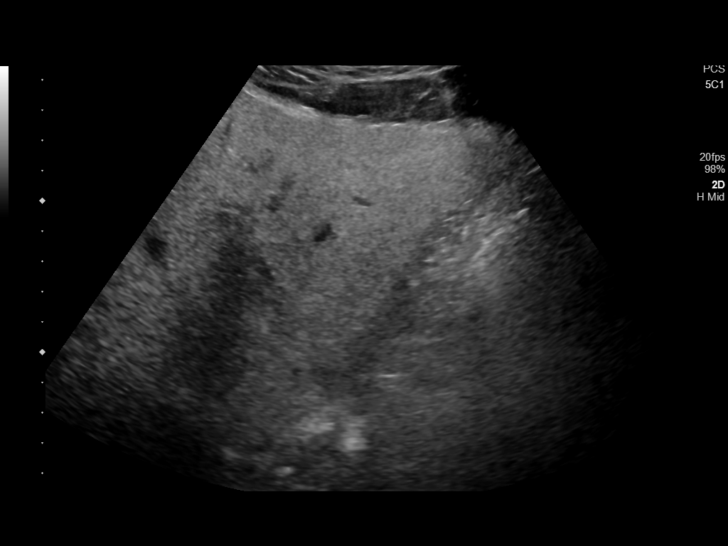
[im 27/106]
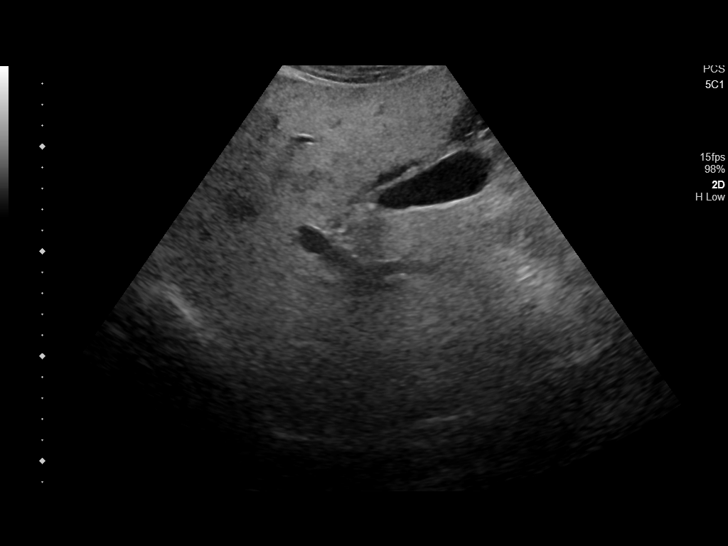
[im 36/106]
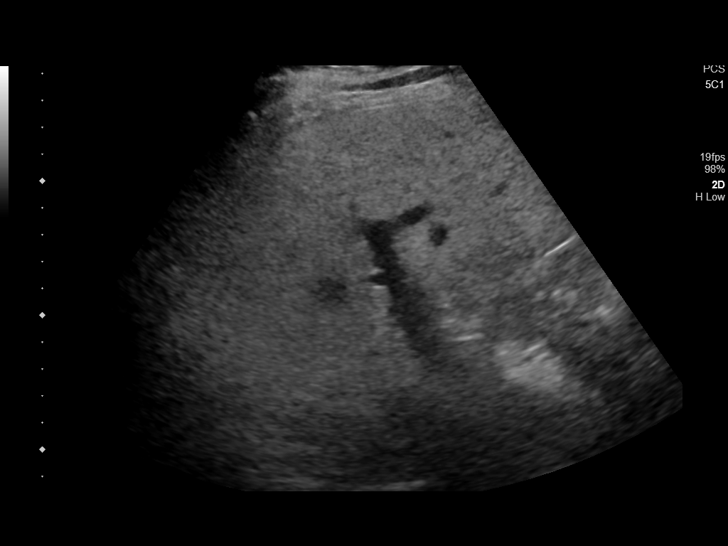
[im 44/106]
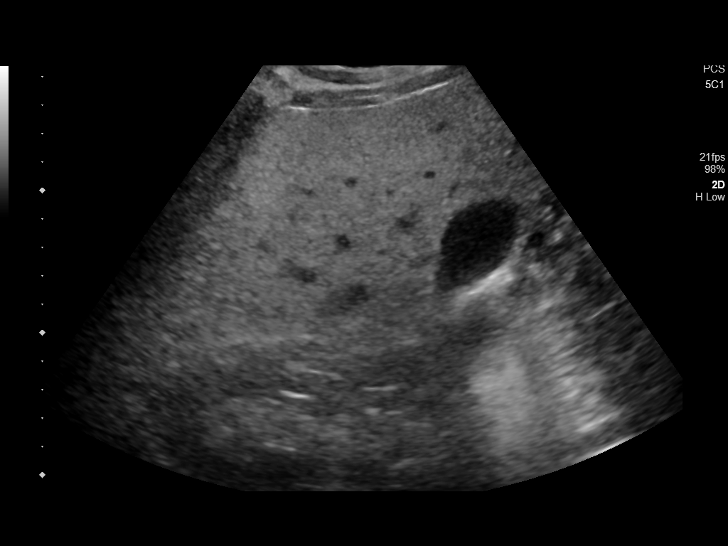
[im 53/106]
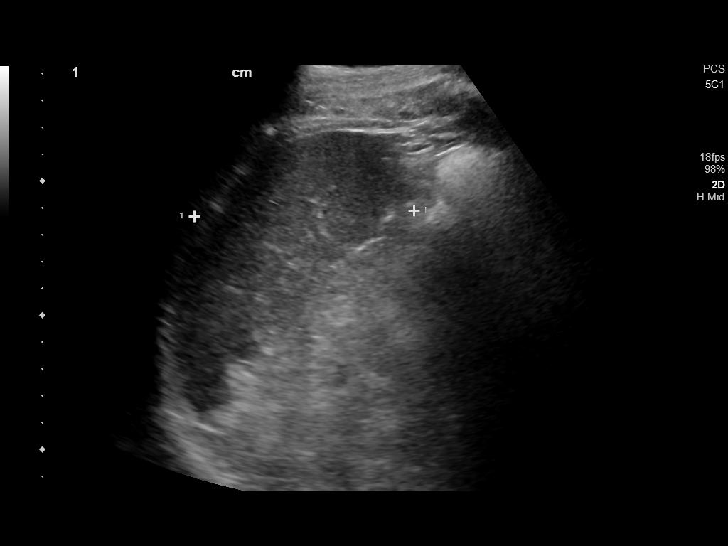
[im 62/106]
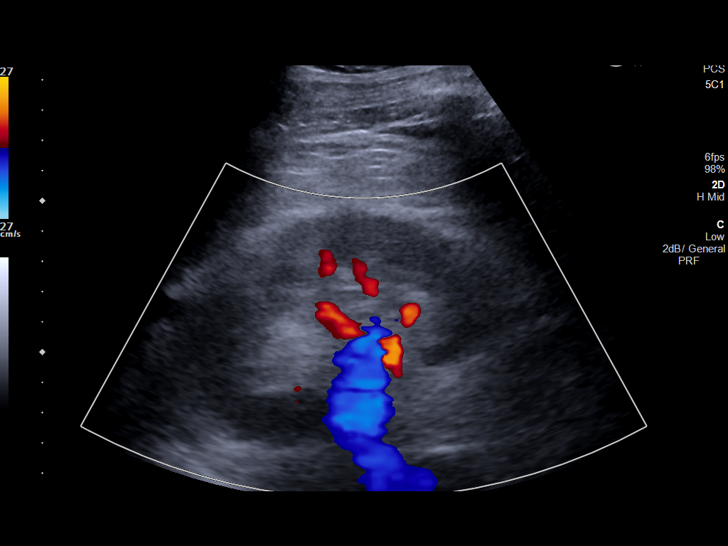
[im 71/106]
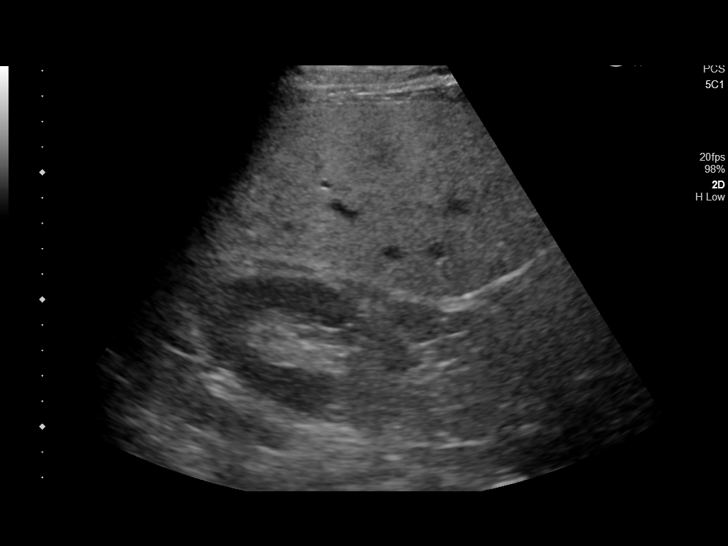
[im 79/106]
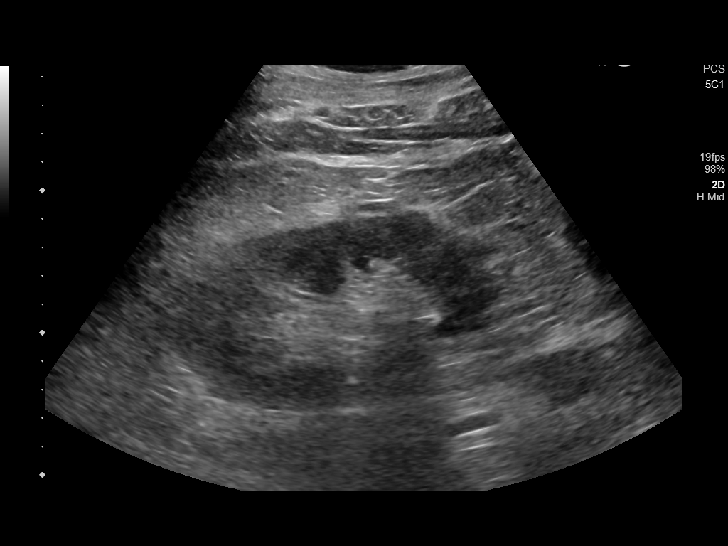
[im 88/106]
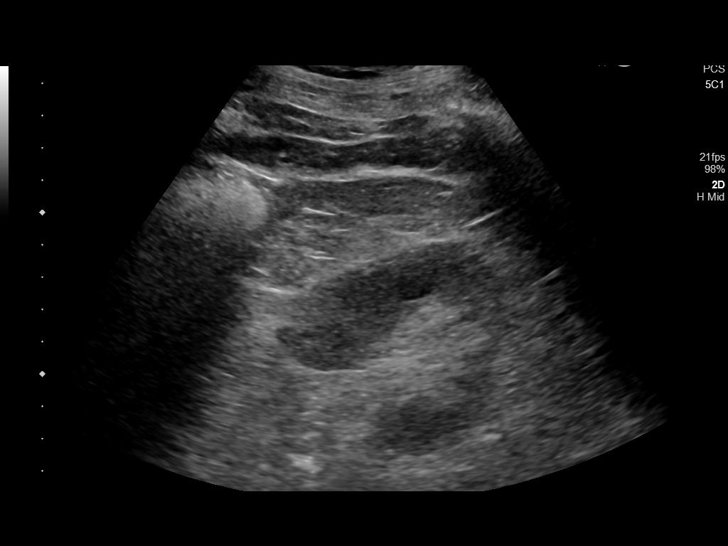
[im 97/106]
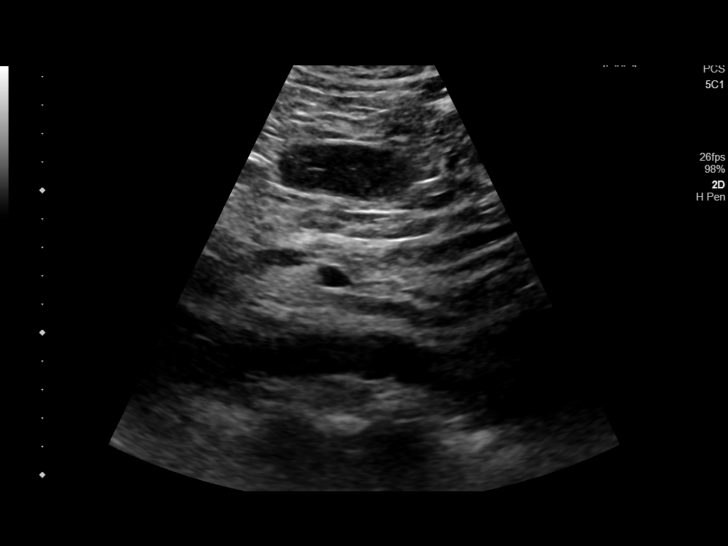
[im 106/106]
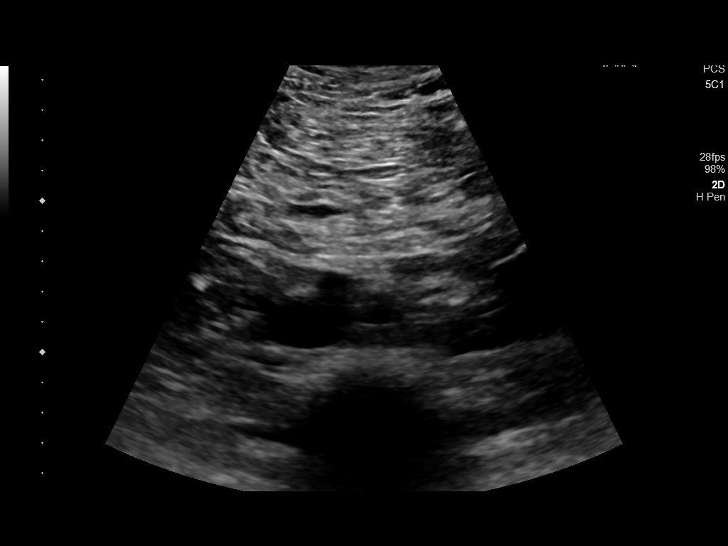

[13 of 25 positions shown; findings below may reference images not displayed]

FINDINGS: Gallbladder: No gallstones or wall thickening visualized. No
sonographic Murphy sign noted by sonographer.

Common bile duct: Diameter: 2.7 mm

Liver: Increased echogenicity consistent fatty infiltration and/or
hepatocellular disease. Area decrease attenuation noted adjacent to
the gallbladder fossa most likely focal fatty sparing. Portal vein
is patent on color Doppler imaging with normal direction of blood
flow towards the liver.

IVC: No abnormality visualized.

Pancreas: Poorly visualized due to overlying bowel gas.

Spleen: Size and appearance within normal limits.

Right Kidney: Length: 11.5 cm. Echogenicity within normal limits.
2.4 cm simple cyst noted in the right kidney. No hydronephrosis
visualized.

Left Kidney: Length: 12.9 cm. Echogenicity within normal limits.
cm simple cyst noted in the left kidney. No hydronephrosis
visualized.

Abdominal aorta: No aneurysm visualized.

Other findings: None.
IMPRESSION: 1. No acute abnormality identified. No gallstones or biliary
distention.

2. Fatty infiltration of the liver with focal fatty sparing in the
gallbladder fossa.

2.  Simple renal cysts both kidneys.

## 2018-02-06 IMAGING — US US THYROID
1 series · 12 of 25 positions shown · non-contrast
Comparison: CT from same day

CLINICAL DATA: Nodule noted on CT chest

EXAM:
THYROID ULTRASOUND
TECHNIQUE: Ultrasound examination of the thyroid gland and adjacent soft
tissues was performed.

[Series 1: us thyroid · 0.07mm/px · 12 of 62 slices shown]
[im 3/62]
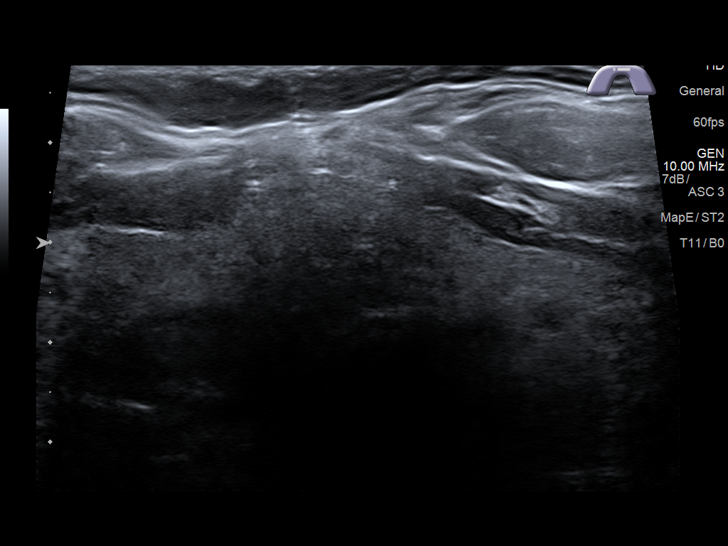
[im 8/62]
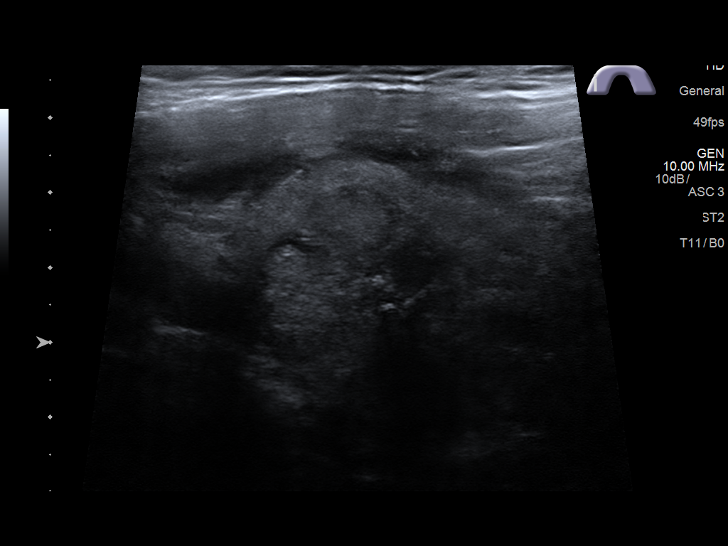
[im 13/62]
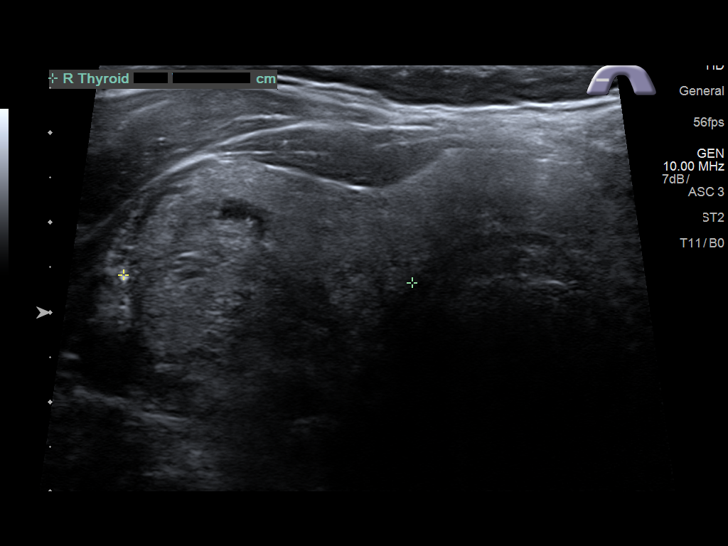
[im 18/62]
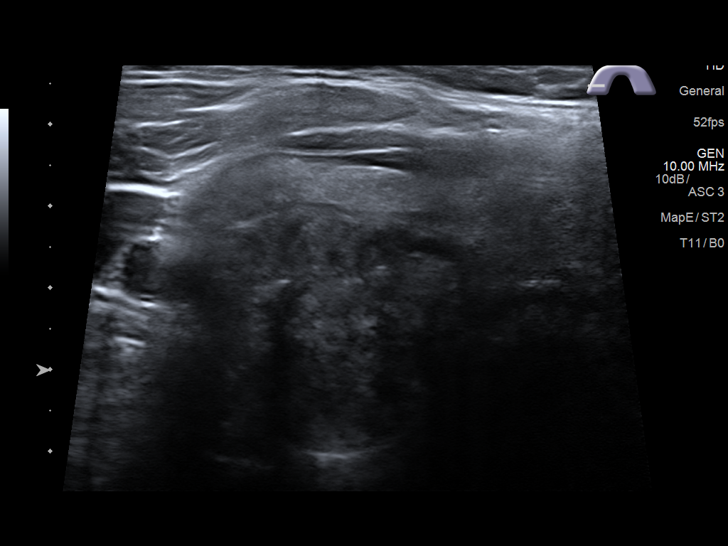
[im 23/62]
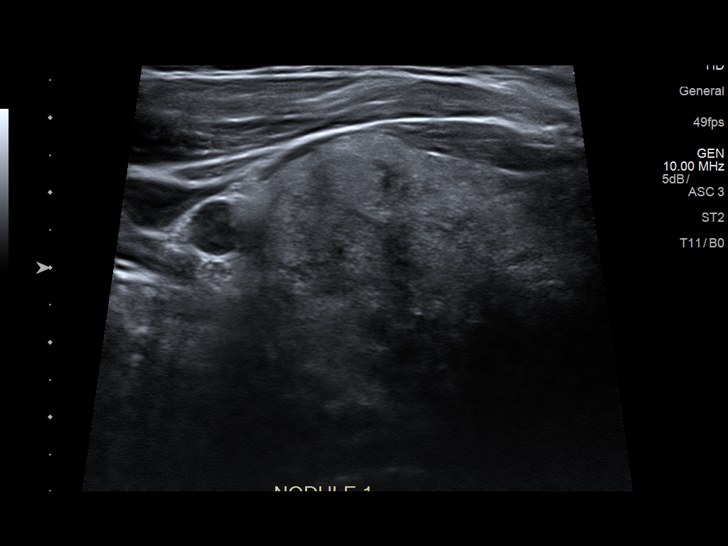
[im 28/62]
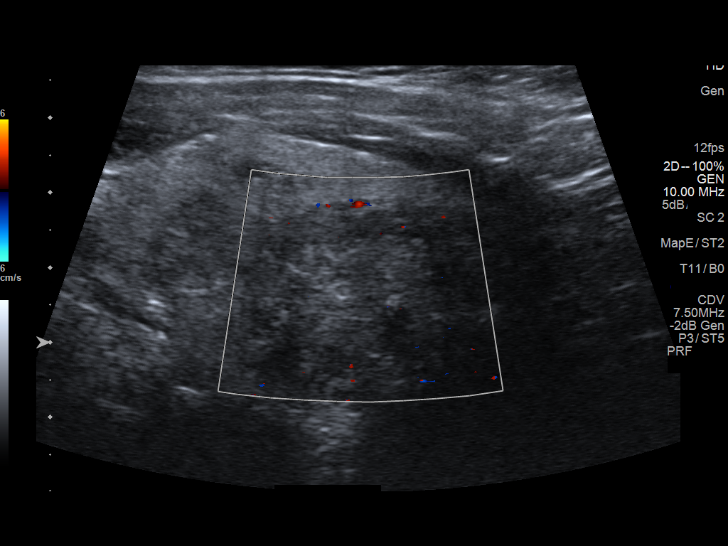
[im 34/62]
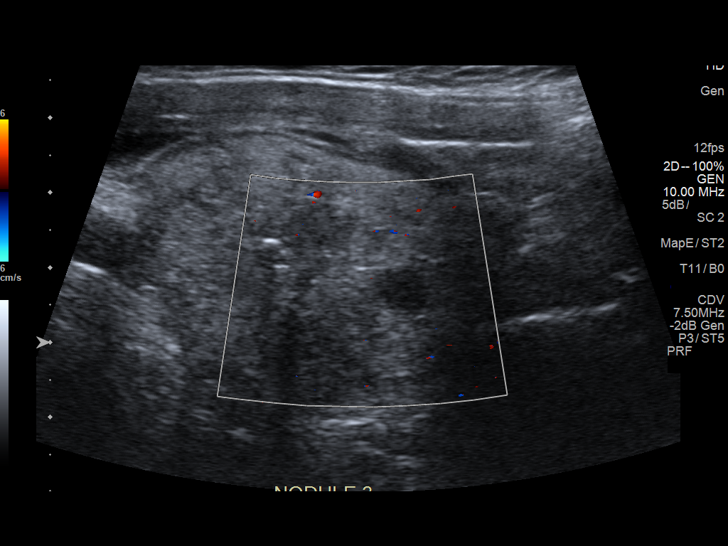
[im 39/62]
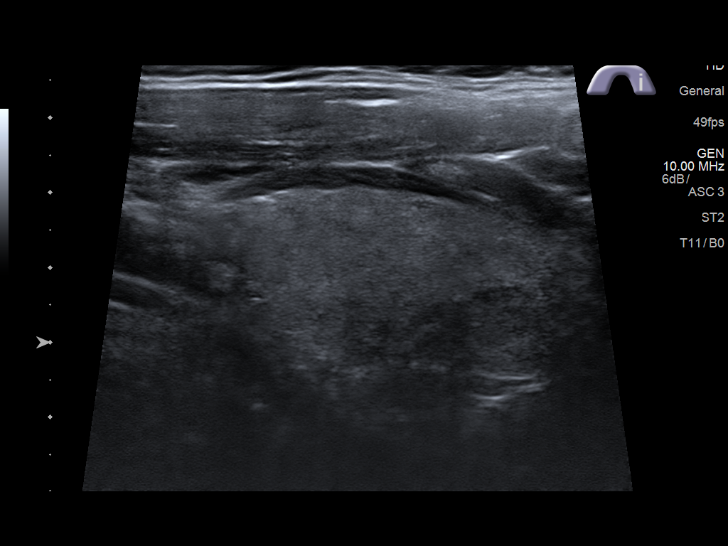
[im 44/62]
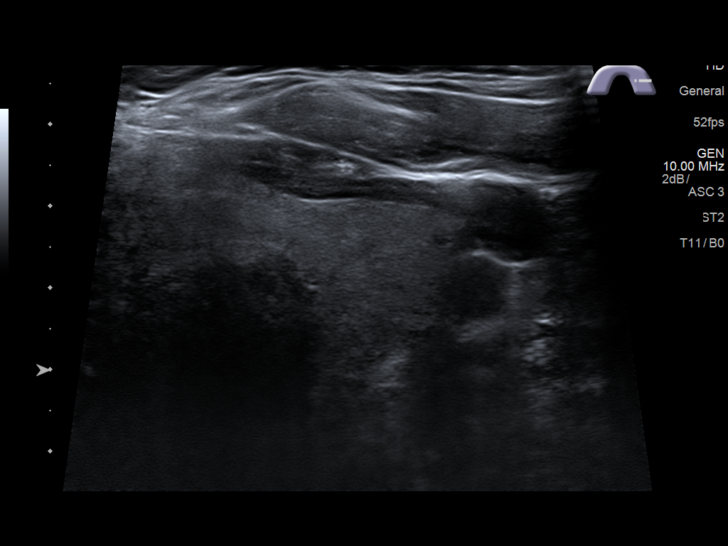
[im 49/62]
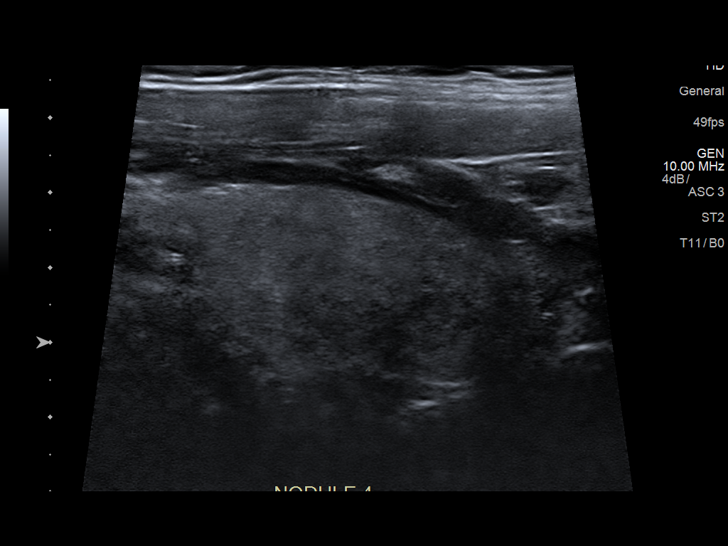
[im 54/62]
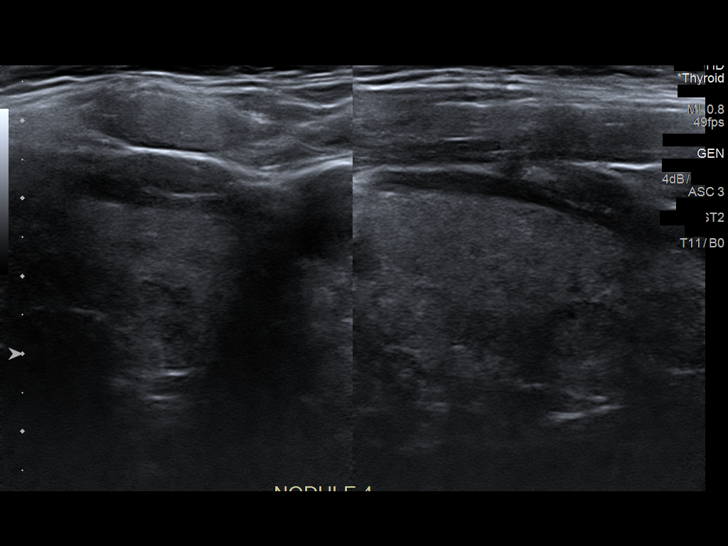
[im 59/62]
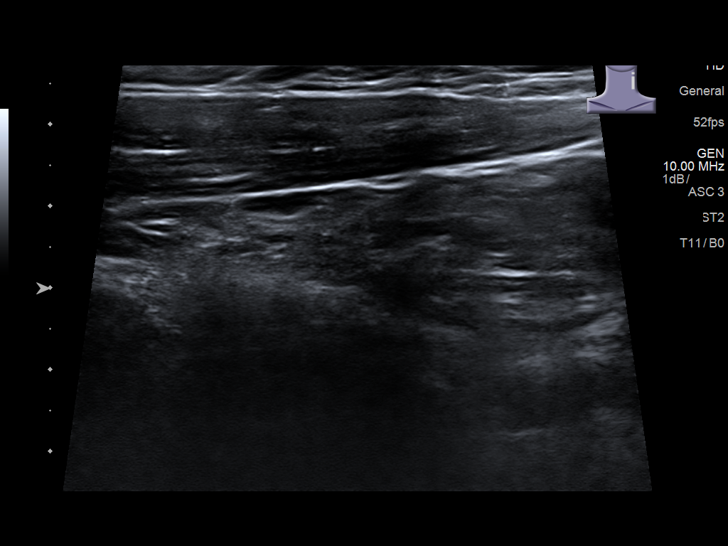

[12 of 25 positions shown; findings below may reference images not displayed]

FINDINGS: Parenchymal Echotexture: Mildly heterogenous

Isthmus: 0.6 cm thickness

Right lobe: 5.6 x 3.5 x 3.2 cm

Left lobe: 4.6 x 2.9 x 2.2 cm

_________________________________________________________

Estimated total number of nodules >/= 1 cm: 4

Number of spongiform nodules >/=  2 cm not described below (TR1): 0

Number of mixed cystic and solid nodules >/= 1.5 cm not described
below (TR2): 0

_________________________________________________________

Nodule # 1:

Location: Right; Superior

Maximum size: 2.2 cm; Other 2 dimensions: 2 x 1.9 cm

Composition: solid/almost completely solid (2)

Echogenicity: isoechoic (1)

Shape: not taller-than-wide (0)

Margins: ill-defined (0)

Echogenic foci: none (0)

ACR TI-RADS total points: 3.

ACR TI-RADS risk category: TR3 (3 points).

ACR TI-RADS recommendations:

*Given size (>/= 1.5 - 2.4 cm) and appearance, a follow-up
ultrasound in 1 year should be considered based on TI-RADS criteria.

_________________________________________________________

Nodule # 2:

Location: Right; Mid

Maximum size: 2.8 cm; Other 2 dimensions: 2.8 x 2.3 cm

Composition: solid/almost completely solid (2)

Echogenicity: hypoechoic (2)

Shape: not taller-than-wide (0)

Margins: ill-defined (0)

Echogenic foci: none (0)

ACR TI-RADS total points: 4.

ACR TI-RADS risk category: TR4 (4-6 points).

ACR TI-RADS recommendations:

**Given size (>/= 1.5 cm) and appearance, fine needle aspiration of
this moderately suspicious nodule should be considered based on
TI-RADS criteria.

_________________________________________________________

Nodule # 3:

Location: Right; Inferior

Maximum size: 2.5 cm; Other 2 dimensions: 2.5 x 2.2 cm

Composition: solid/almost completely solid (2)

Echogenicity: isoechoic (1)

Shape: not taller-than-wide (0)

Margins: ill-defined (0)

Echogenic foci: none (0)

ACR TI-RADS total points: 3.

ACR TI-RADS risk category: TR3 (3 points).

ACR TI-RADS recommendations:

**Given size (>/= 2.5 cm) and appearance, fine needle aspiration of
this mildly suspicious nodule should be considered based on TI-RADS
criteria.

_________________________________________________________

Nodule # 4:

Location: Left; Inferior

Maximum size: 1.4 cm; Other 2 dimensions: 1.2 x 1.2 cm

Composition: solid/almost completely solid (2)

Echogenicity: hypoechoic (2)

Shape: not taller-than-wide (0)

Margins: ill-defined (0)

Echogenic foci: none (0)

ACR TI-RADS total points: 4.

ACR TI-RADS risk category: TR4 (4-6 points).

ACR TI-RADS recommendations:

*Given size (>/= 1 - 1.4 cm) and appearance, a follow-up ultrasound
in 1 year should be considered based on TI-RADS criteria.

_________________________________________________________
IMPRESSION: 1. Mild thyromegaly with bilateral nodules.
2. Recommend FNA biopsy of suspicious 2.5 cm inferior right AND
moderately suspicious 2.8 cm mid right nodules.
3. Recommend annual/biennial ultrasound follow-up of additional
lesions as above until stability x5 years confirmed.

The above is in keeping with the ACR TI-RADS recommendations - [HOSPITAL] [76];[DATE].

## 2018-02-17 ENCOUNTER — Encounter: Payer: Self-pay | Admitting: Urology

## 2018-02-17 ENCOUNTER — Ambulatory Visit (INDEPENDENT_AMBULATORY_CARE_PROVIDER_SITE_OTHER): Payer: Medicare Other | Admitting: Urology

## 2018-02-17 VITALS — BP 112/69 | HR 65 | Resp 16 | Ht 73.0 in | Wt 232.7 lb

## 2018-02-17 DIAGNOSIS — N400 Enlarged prostate without lower urinary tract symptoms: Secondary | ICD-10-CM | POA: Diagnosis not present

## 2018-02-17 DIAGNOSIS — R972 Elevated prostate specific antigen [PSA]: Secondary | ICD-10-CM | POA: Diagnosis not present

## 2018-02-17 NOTE — Progress Notes (Signed)
02/17/2018 2:08 PM   Jackson [name] Jun 10, 1951 [id number]  Referring provider: [name], [name], MD [place], [place] [address]  Chief Complaint  Patient presents with  . Benign Prostatic Hypertrophy    HPI: Jackson [name] is a 67 year old male referred for evaluation of prostate enlargement with an abnormality noted on a CT of the abdomen pelvis in 2008.  This scan was performed for abdominal pain.  The prostate was noted to be slightly enlarged with small calcifications and a low-density lesion in the right prostate measuring 13 mm.  The radiologist recommended a prostate ultrasound for further evaluation.  He has occasional urgency and nocturia x1.  His voiding symptoms are not bothersome.  Denies dysuria or gross hematuria.  A PSA performed August 2018 was 3.01.  A previous PSA in 2017 was 3.41.  An abdominal ultrasound performed in early April 2019 showed no hydronephrosis, renal masses and small bilateral simple renal cyst were noted.   PMH: Past Medical History:  Diagnosis Date  . Allergy   . Anemia   . Arthritis   . Cancer (HCC)    BASAL CELL-FACE  . Complication of anesthesia    PT IS A RED HEAD AND STATES IT TAKES ALOT MORE ANESTHESIA TO SEDATE HIM  . Diabetes mellitus without complication ([place])   . Dysrhythmia    SVT  . GERD (gastroesophageal reflux disease)   . Hyperlipidemia   . Rheumatic fever   . Sleep apnea 1997   Uses C-Pap machine  . SVT (supraventricular tachycardia) ([place])   . Thyroid nodule     Surgical History: Past Surgical History:  Procedure Laterality Date  . APPENDECTOMY  1965  . BICEPS TENDON REPAIR Right 05/2015  . COLONOSCOPY  2016  . LUMBAR FUSION  2015   L4/L5  . RHINOPLASTY  1997  . TONSILLECTOMY  1956  . VASECTOMY  1981    Home Medications:  Allergies as of 02/17/2018      Reactions   Statins Other (See Comments)   Myalgias      Medication List        Accurate as of 02/17/18  2:08 PM. Always use  your most recent med list.          azelastine 0.1 % nasal spray Commonly known as:  ASTELIN Place 1 spray into both nostrils 2 (two) times daily. Use in each nostril as directed   cetirizine 10 MG tablet Commonly known as:  ZYRTEC Take 10 mg by mouth at bedtime.   Cinnamon 500 MG capsule Take 500 mg by mouth daily.   CoQ10 200 MG Caps Take 200 mg by mouth.   Fish Oil 1200 MG Cpdr Take 1 tablet by mouth daily.   metFORMIN 1000 MG tablet Commonly known as:  GLUCOPHAGE Take 1 tablet (1,000 mg total) by mouth 2 (two) times daily with a meal. .   metoprolol succinate 25 MG 24 hr tablet Commonly known as:  TOPROL-XL Take 1 tablet (25 mg total) by mouth daily. Take with or immediately following a meal.   MULTIVITAMIN ADULT PO Take by mouth.   omeprazole 40 MG capsule Commonly known as:  PRILOSEC Take 1 capsule (40 mg total) by mouth daily.   OVER THE COUNTER MEDICATION Osteo biflex   sitaGLIPtin 50 MG tablet Commonly known as:  JANUVIA Take 1 tablet (50 mg total) by mouth daily.   vitamin C 500 MG tablet Commonly known as:  ASCORBIC ACID Take 500 mg by mouth daily.  vitamin E 100 UNIT capsule Take 500 Units by mouth daily.       Allergies:  Allergies  Allergen Reactions  . Statins Other (See Comments)    Myalgias    Family History: Family History  Problem Relation Age of Onset  . Arthritis Mother   . Lung cancer Mother   . Hyperlipidemia Mother   . Diabetes Mother   . Arthritis Father   . Diabetes Father   . Lung disease Father   . Hyperlipidemia Maternal Grandmother   . Alcohol abuse Maternal Grandfather   . Hyperlipidemia Maternal Grandfather   . Drug abuse Cousin     Social History:  reports that he has never smoked. He has quit using smokeless tobacco. He reports that he drinks alcohol. He reports that he does not use drugs.  ROS: UROLOGY Frequent Urination?: No Hard to postpone urination?: Yes Burning/pain with urination?: No Get up  at night to urinate?: Yes Leakage of urine?: No Urine stream starts and stops?: No Trouble starting stream?: No Do you have to strain to urinate?: No Blood in urine?: No Urinary tract infection?: No Sexually transmitted disease?: No Injury to kidneys or bladder?: No Painful intercourse?: No Weak stream?: No Erection problems?: No Penile pain?: No  Gastrointestinal Nausea?: No Vomiting?: No Indigestion/heartburn?: No Diarrhea?: No Constipation?: No  Constitutional Fever: No Night sweats?: No Weight loss?: No Fatigue?: Yes  Skin Skin rash/lesions?: No Itching?: No  Eyes Blurred vision?: No Double vision?: No  Ears/Nose/Throat Sore throat?: No Sinus problems?: Yes  Hematologic/Lymphatic Swollen glands?: No Easy bruising?: No  Cardiovascular Leg swelling?: No Chest pain?: No  Respiratory Cough?: Yes Shortness of breath?: No  Endocrine Excessive thirst?: No  Musculoskeletal Back pain?: Yes Joint pain?: Yes  Neurological Headaches?: No Dizziness?: No  Psychologic Depression?: No Anxiety?: No  Physical Exam: BP 112/69   Pulse 65   Resp 16   Ht 6\' 1"  (1.854 m)   Wt 232 lb 11.2 oz (105.6 kg)   SpO2 98%   BMI 30.70 kg/m   Constitutional:  Alert and oriented, No acute distress. HEENT: [place] AT, moist mucus membranes.  Trachea midline, no masses. Cardiovascular: No clubbing, cyanosis, or edema. Respiratory: Normal respiratory effort, no increased work of breathing. GI: Abdomen is soft, nontender, nondistended, no abdominal masses GU: No CVA tenderness.  Prostate 50 g, smooth without nodules Lymph: No cervical or inguinal lymphadenopathy. Skin: No rashes, bruises or suspicious lesions. Neurologic: Grossly intact, no focal deficits, moving all 4 extremities. Psychiatric: Normal mood and affect.  Laboratory Data: Lab Results  Component Value Date   WBC 7.6 01/20/2018   HGB 14.5 01/20/2018   HCT 43.5 01/20/2018   MCV 84.8 01/20/2018   PLT  228.0 01/20/2018    Lab Results  Component Value Date   CREATININE 1.10 01/20/2018    Lab Results  Component Value Date   PSA 3.01 05/26/2017   PSA 3.41 05/26/2016    Lab Results  Component Value Date   HGBA1C 7.1 (H) 01/20/2018    Urinalysis    Component Value Date/Time   BILIRUBINUR negative 05/26/2017 1427   PROTEINUR negative 05/26/2017 1427   UROBILINOGEN 0.2 05/26/2017 1427   NITRITE negative 05/26/2017 1427   LEUKOCYTESUR Negative 05/26/2017 1427     Assessment & Plan:   67 year old male with a benign DRE and normal/stable PSA.  The CT finding is nonspecific.  An ultrasound of the prostate would not provide any additional information.  No further evaluation of this finding is  indicated as he has a benign DRE and normal PSA.  Recommend continuing annual PSA and follow-up as needed for any significant change.  Return if symptoms worsen or fail to improve.    Abbie Sons, [place] [address] [place], [place] [address] [phone]

## 2018-02-18 LAB — URINALYSIS, COMPLETE
Bilirubin, UA: NEGATIVE
Glucose, UA: NEGATIVE
Ketones, UA: NEGATIVE
Leukocytes, UA: NEGATIVE
Nitrite, UA: NEGATIVE
Protein, UA: NEGATIVE
RBC, UA: NEGATIVE
Specific Gravity, UA: 1.02 (ref 1.005–1.030)
Urobilinogen, Ur: 0.2 mg/dL (ref 0.2–1.0)
pH, UA: 6.5 (ref 5.0–7.5)

## 2018-03-03 ENCOUNTER — Ambulatory Visit (INDEPENDENT_AMBULATORY_CARE_PROVIDER_SITE_OTHER): Payer: Medicare Other | Admitting: Internal Medicine

## 2018-03-03 ENCOUNTER — Encounter: Payer: Self-pay | Admitting: Internal Medicine

## 2018-03-03 VITALS — BP 120/70 | HR 67 | Temp 98.3°F | Ht 73.0 in | Wt 232.4 lb

## 2018-03-03 DIAGNOSIS — N400 Enlarged prostate without lower urinary tract symptoms: Secondary | ICD-10-CM

## 2018-03-03 DIAGNOSIS — E041 Nontoxic single thyroid nodule: Secondary | ICD-10-CM | POA: Diagnosis not present

## 2018-03-03 DIAGNOSIS — R5383 Other fatigue: Secondary | ICD-10-CM | POA: Diagnosis not present

## 2018-03-03 DIAGNOSIS — G4733 Obstructive sleep apnea (adult) (pediatric): Secondary | ICD-10-CM

## 2018-03-03 DIAGNOSIS — R001 Bradycardia, unspecified: Secondary | ICD-10-CM

## 2018-03-03 DIAGNOSIS — Z0184 Encounter for antibody response examination: Secondary | ICD-10-CM

## 2018-03-03 DIAGNOSIS — Z23 Encounter for immunization: Secondary | ICD-10-CM | POA: Diagnosis not present

## 2018-03-03 DIAGNOSIS — K760 Fatty (change of) liver, not elsewhere classified: Secondary | ICD-10-CM

## 2018-03-03 DIAGNOSIS — R0789 Other chest pain: Secondary | ICD-10-CM

## 2018-03-03 DIAGNOSIS — Z125 Encounter for screening for malignant neoplasm of prostate: Secondary | ICD-10-CM

## 2018-03-03 DIAGNOSIS — I471 Supraventricular tachycardia: Secondary | ICD-10-CM

## 2018-03-03 DIAGNOSIS — Z1159 Encounter for screening for other viral diseases: Secondary | ICD-10-CM | POA: Diagnosis not present

## 2018-03-03 DIAGNOSIS — E119 Type 2 diabetes mellitus without complications: Secondary | ICD-10-CM

## 2018-03-03 HISTORY — DX: Bradycardia, unspecified: R00.1

## 2018-03-03 NOTE — Progress Notes (Signed)
Pre visit review using our clinic review tool, if applicable. No additional management support is needed unless otherwise documented below in the visit note. 

## 2018-03-03 NOTE — Progress Notes (Addendum)
Chief Complaint  Patient presents with  . Follow-up   F/u  1. Fatigue somewhat improved since reduced BB but still present. He did see urology Dr. Bernardo Heater who rec f/u PSA and f/u prn  2. [place] + fatty liver  3. H/o thyroid nodules will f/u on this  4. Bradycardia HR improved since reduced BB toprol 50 to 25 mg qd max HR with exercise biking has been 130    Review of Systems  Constitutional: Positive for malaise/fatigue.  HENT: Negative for hearing loss.   Eyes: Negative for blurred vision.  Respiratory: Negative for shortness of breath.   Cardiovascular: Negative for chest pain.       Loletha Grayer improved   Gastrointestinal: Negative for abdominal pain.  Musculoskeletal: Negative for falls.  Skin: Negative for rash.  Neurological: Negative for headaches.  Psychiatric/Behavioral: Negative for depression.   Past Medical History:  Diagnosis Date  . Allergy   . Anemia   . Arthritis   . Cancer (HCC)    BASAL CELL-FACE  . Complication of anesthesia    PT IS A RED HEAD AND STATES IT TAKES ALOT MORE ANESTHESIA TO SEDATE HIM  . Diabetes mellitus without complication ([place])   . Dysrhythmia    SVT  . Fatty liver   . GERD (gastroesophageal reflux disease)   . Hyperlipidemia   . Multiple thyroid nodules   . Rheumatic fever   . Sleep apnea 1997   Uses C-Pap machine  . SVT (supraventricular tachycardia) ([place])   . Thyroid nodule    Past Surgical History:  Procedure Laterality Date  . APPENDECTOMY  1965  . BICEPS TENDON REPAIR Right 05/2015  . COLONOSCOPY  2016  . LUMBAR FUSION  2015   L4/L5  . RHINOPLASTY  1997  . TONSILLECTOMY  1956  . VASECTOMY  1981   Family History  Problem Relation Age of Onset  . Arthritis Mother   . Lung cancer Mother   . Hyperlipidemia Mother   . Diabetes Mother   . Arthritis Father   . Diabetes Father   . Lung disease Father   . Hyperlipidemia Maternal Grandmother   . Alcohol abuse Maternal Grandfather   . Hyperlipidemia Maternal Grandfather   .  Drug abuse Cousin    Social History   Socioeconomic History  . Marital status: Married    Spouse name: Not on file  . Number of children: Not on file  . Years of education: Not on file  . Highest education level: Not on file  Occupational History  . Not on file  Social Needs  . Financial resource strain: Not on file  . Food insecurity:    Worry: Not on file    Inability: Not on file  . Transportation needs:    Medical: Not on file    Non-medical: Not on file  Tobacco Use  . Smoking status: Never Smoker  . Smokeless tobacco: Former Network engineer and Sexual Activity  . Alcohol use: Yes    Comment: OCC  . Drug use: No  . Sexual activity: Not Currently  Lifestyle  . Physical activity:    Days per week: Not on file    Minutes per session: Not on file  . Stress: Not on file  Relationships  . Social connections:    Talks on phone: Not on file    Gets together: Not on file    Attends religious service: Not on file    Active member of club or organization: Not on file  Attends meetings of clubs or organizations: Not on file    Relationship status: Not on file  . Intimate partner violence:    Fear of current or ex partner: Not on file    Emotionally abused: Not on file    Physically abused: Not on file    Forced sexual activity: Not on file  Other Topics Concern  . Not on file  Social History Narrative   Married    Former Nature conservation officer    Current Meds  Medication Sig  . azelastine (ASTELIN) 0.1 % nasal spray Place 1 spray into both nostrils 2 (two) times daily. Use in each nostril as directed  . cetirizine (ZYRTEC) 10 MG tablet Take 10 mg by mouth at bedtime.   . Cinnamon 500 MG capsule Take 500 mg by mouth daily.  . Coenzyme Q10 (COQ10) 200 MG CAPS Take 200 mg by mouth.  . metFORMIN (GLUCOPHAGE) 1000 MG tablet Take 1 tablet (1,000 mg total) by mouth 2 (two) times daily with a meal. .  . metoprolol succinate (TOPROL-XL) 25 MG 24 hr tablet Take 1 tablet (25 mg total) by  mouth daily. Take with or immediately following a meal.  . Multiple Vitamins-Minerals (MULTIVITAMIN ADULT PO) Take by mouth.  . Omega-3 Fatty Acids (FISH OIL) 1200 MG CPDR Take 1 tablet by mouth daily.  Marland Kitchen omeprazole (PRILOSEC) 40 MG capsule Take 1 capsule (40 mg total) by mouth daily.  Marland Kitchen OVER THE COUNTER MEDICATION Osteo biflex  . sitaGLIPtin (JANUVIA) 50 MG tablet Take 1 tablet (50 mg total) by mouth daily.  . vitamin C (ASCORBIC ACID) 500 MG tablet Take 500 mg by mouth daily.  . vitamin E 100 UNIT capsule Take 500 Units by mouth daily.   Allergies  Allergen Reactions  . Statins Other (See Comments)    Myalgias   Recent Results (from the past 2160 hour(s))  Comprehensive metabolic panel     Status: Abnormal   Collection Time: 01/20/18  2:36 PM  Result Value Ref Range   Sodium 139 135 - 145 mEq/L   Potassium 4.6 3.5 - 5.1 mEq/L   Chloride 101 96 - 112 mEq/L   CO2 31 19 - 32 mEq/L   Glucose, Bld 170 (H) 70 - 99 mg/dL   BUN 13 6 - 23 mg/dL   Creatinine, Ser 1.10 0.40 - 1.50 mg/dL   Total Bilirubin 0.5 0.2 - 1.2 mg/dL   Alkaline Phosphatase 44 39 - 117 U/L   AST 25 0 - 37 U/L   ALT 40 0 - 53 U/L   Total Protein 7.2 6.0 - 8.3 g/dL   Albumin 4.2 3.5 - 5.2 g/dL   Calcium 9.6 8.4 - 10.5 mg/dL   GFR 71.31 >60.00 mL/min  CBC with Differential/Platelet     Status: None   Collection Time: 01/20/18  2:36 PM  Result Value Ref Range   WBC 7.6 4.0 - 10.5 K/uL   RBC 5.13 4.22 - 5.81 Mil/uL   Hemoglobin 14.5 13.0 - 17.0 g/dL   HCT 43.5 39.0 - 52.0 %   MCV 84.8 78.0 - 100.0 fl   MCHC 33.4 30.0 - 36.0 g/dL   RDW 13.7 11.5 - 15.5 %   Platelets 228.0 150.0 - 400.0 K/uL   Neutrophils Relative % 56.5 43.0 - 77.0 %   Lymphocytes Relative 33.6 12.0 - 46.0 %   Monocytes Relative 6.7 3.0 - 12.0 %   Eosinophils Relative 2.6 0.0 - 5.0 %   Basophils Relative 0.6 0.0 - 3.0 %  Neutro Abs 4.3 1.4 - 7.7 K/uL   Lymphs Abs 2.6 0.7 - 4.0 K/uL   Monocytes Absolute 0.5 0.1 - 1.0 K/uL   Eosinophils  Absolute 0.2 0.0 - 0.7 K/uL   Basophils Absolute 0.0 0.0 - 0.1 K/uL  T4, free     Status: None   Collection Time: 01/20/18  2:36 PM  Result Value Ref Range   Free T4 0.66 0.60 - 1.60 ng/dL    Comment: Specimens from patients who are undergoing biotin therapy and /or ingesting biotin supplements may contain high levels of biotin.  The higher biotin concentration in these specimens interferes with this Free T4 assay.  Specimens that contain high levels  of biotin may cause false high results for this Free T4 assay.  Please interpret results in light of the total clinical presentation of the patient.    TSH     Status: None   Collection Time: 01/20/18  2:36 PM  Result Value Ref Range   TSH 1.13 0.35 - 4.50 uIU/mL  Vitamin D (25 hydroxy)     Status: None   Collection Time: 01/20/18  2:36 PM  Result Value Ref Range   VITD 31.33 30.00 - 100.00 ng/mL  Hemoglobin A1c     Status: Abnormal   Collection Time: 01/20/18  2:36 PM  Result Value Ref Range   Hgb A1c MFr Bld 7.1 (H) 4.6 - 6.5 %    Comment: Glycemic Control Guidelines for People with Diabetes:Non Diabetic:  <6%Goal of Therapy: <7%Additional Action Suggested:  >8%   Hepatitis C antibody     Status: None   Collection Time: 01/20/18  2:36 PM  Result Value Ref Range   Hepatitis C Ab NON-REACTIVE NON-REACTI   SIGNAL TO CUT-OFF 0.02 <1.00    Comment: . HCV antibody was non-reactive. There is no laboratory  evidence of HCV infection. . In most cases, no further action is required. However, if recent HCV exposure is suspected, a test for HCV RNA (test code [id number]) is suggested. . For additional information please refer to http://education.[facility].com/faq/FAQ22v1 (This link is being provided for informational/ educational purposes only.) .   Hepatitis B surface antibody     Status: Abnormal   Collection Time: 01/20/18  2:36 PM  Result Value Ref Range   Hepatitis B-Post <5 (L) > OR = 10 mIU/mL    Comment: . Patient does not  have immunity to hepatitis B virus. . For additional information, please refer to http://education.[facility].com/faq/FAQ105 (This link is being provided for informational/ educational purposes only).   Hepatitis B surface antigen     Status: None   Collection Time: 01/20/18  2:36 PM  Result Value Ref Range   Hepatitis B Surface Ag NON-REACTIVE NON-REACTI  Urinalysis, Complete     Status: None   Collection Time: 02/17/18  1:27 PM  Result Value Ref Range   Specific Gravity, UA 1.020 1.005 - 1.030   pH, UA 6.5 5.0 - 7.5   Color, UA Yellow Yellow   Appearance Ur Clear Clear   Leukocytes, UA Negative Negative   Protein, UA Negative Negative/Trace   Glucose, UA Negative Negative   Ketones, UA Negative Negative   RBC, UA Negative Negative   Bilirubin, UA Negative Negative   Urobilinogen, Ur 0.2 0.2 - 1.0 mg/dL   Nitrite, UA Negative Negative   Objective  Body mass index is 30.66 kg/m. Wt Readings from Last 3 Encounters:  03/03/18 232 lb 6.4 oz (105.4 kg)  02/17/18 232 lb 11.2 oz (105.6 kg)  01/20/18 236 lb 6.4 oz (107.2 kg)   Temp Readings from Last 3 Encounters:  03/03/18 98.3 F (36.8 C) (Oral)  01/20/18 98.2 F (36.8 C) (Oral)  05/26/17 98.9 F (37.2 C) (Oral)   BP Readings from Last 3 Encounters:  03/03/18 120/70  02/17/18 112/69  01/20/18 126/70   Pulse Readings from Last 3 Encounters:  03/03/18 67  02/17/18 65  01/20/18 (!) 59    Physical Exam  Constitutional: He is oriented to person, place, and time. Vital signs are normal. He appears well-developed and well-nourished. He is cooperative.  HENT:  Head: Normocephalic and atraumatic.  Mouth/Throat: Oropharynx is clear and moist and mucous membranes are normal.  Eyes: Pupils are equal, round, and reactive to light. Conjunctivae are normal.  Cardiovascular: Normal rate, regular rhythm and normal heart sounds.  Pulmonary/Chest: Effort normal and breath sounds normal.  Neurological: He is alert and  oriented to person, place, and time. Gait normal.  Skin: Skin is warm, dry and intact.  Psychiatric: He has a normal mood and affect. His speech is normal and behavior is normal. Judgment and thought content normal. Cognition and memory are normal.  Nursing note and vitals reviewed.   Assessment   1. Fatigue  2. Fatty liver  3. DM 2  4. H/o thyroid nodules  5. Bradycardia improved, h/o SVT   6. HM Plan   1.  Consider repeat sleep study in future  Repeat labs upcoming 05/2018   Further w/u with CT chest and thyroid US  Saw urology Dr. Bernardo Heater who rec f/u prn and noted BPH 2.  Given twinrix today will need another in 1 and 6 months  3.  Cont meds  Repeat labs 06/20/18 fasting  Cant tolerate statin 4. Thyroid US to f/u  5. Tolerating reduced BB 25 XL toprol  6.  Had flu shot 07/2017  Consider Tdap in future given Rx today  Had prevnar will need pna 23 in 1 year  Consider shingrix in future  Given twinrix today will need another dose in 1 month then 6 months   rec hep A/ hep B (rec hep B vaccine/hep C neg) PSA 05/26/17 3.01 repeat in 05/2018  Need to get colonoscopy records fro m2016 last PCP Dr. [facility] [place] per pt no h/o polyps.  Derm f/u next week on  [facility]   Reviewed records Dr. Eliezer Champagne  pna 23 given 2013  Zoster 03/13/15  Colonoscopy 10/17/15  Normal fair prep  CXR 06/21/15 negative  H/o low ferritin 10.2 12/30/15 and iron def anemia  PSA 3.75 12/30/15, 3.60 09/23/15      Provider: Dr. Olivia Mackie [name]-Internal Medicine

## 2018-03-03 NOTE — Patient Instructions (Addendum)
Schedule Hep A/B vaccine in 1 month and 6 months from today   Fatty Liver Fatty liver, also called hepatic steatosis or steatohepatitis, is a condition in which too much fat has built up in your liver cells. The liver removes harmful substances from your bloodstream. It produces fluids your body needs. It also helps your body use and store energy from the food you eat. In many cases, fatty liver does not cause symptoms or problems. It is often diagnosed when tests are being done for other reasons. However, over time, fatty liver can cause inflammation that may lead to more serious liver problems, such as scarring of the liver (cirrhosis). What are the causes? Causes of fatty liver may include:  Drinking too much alcohol.  Poor nutrition.  Obesity.  Cushing syndrome.  Diabetes.  Hyperlipidemia.  Pregnancy.  Certain drugs.  Poisons.  Some viral infections.  What increases the risk? You may be more likely to develop fatty liver if you:  Abuse alcohol.  Are pregnant.  Are overweight.  Have diabetes.  Have hepatitis.  Have a high triglyceride level.  What are the signs or symptoms? Fatty liver often does not cause any symptoms. In cases where symptoms develop, they can include:  Fatigue.  Weakness.  Weight loss.  Confusion.  Abdominal pain.  Yellowing of your skin and the white parts of your eyes (jaundice).  Nausea and vomiting.  How is this diagnosed? Fatty liver may be diagnosed by:  Physical exam and medical history.  Blood tests.  Imaging tests, such as an ultrasound, CT scan, or MRI.  Liver biopsy. A small sample of liver tissue is removed using a needle. The sample is then looked at under a microscope.  How is this treated? Fatty liver is often caused by other health conditions. Treatment for fatty liver may involve medicines and lifestyle changes to manage conditions such as:  Alcoholism.  High cholesterol.  Diabetes.  Being  overweight or obese.  Follow these instructions at home:  Eat a healthy diet as directed by your health care provider.  Exercise regularly. This can help you lose weight and control your cholesterol and diabetes. Talk to your health care provider about an exercise plan and which activities are best for you.  Do not drink alcohol.  Take medicines only as directed by your health care provider. Contact a health care provider if: You have difficulty controlling your:  Blood sugar.  Cholesterol.  Alcohol consumption.  Get help right away if:  You have abdominal pain.  You have jaundice.  You have nausea and vomiting. This information is not intended to replace advice given to you by your health care provider. Make sure you discuss any questions you have with your health care provider. Document Released: 11/27/2005 Document Revised: 03/19/2016 Document Reviewed: 02/21/2014 Elsevier Interactive Patient Education  2018 Elsevier Inc.   Benign Prostatic Hyperplasia Benign prostatic hyperplasia (BPH) is an enlarged prostate gland that is caused by the normal aging process and not by cancer. The prostate is a walnut-sized gland that is involved in the production of semen. It is located in front of the rectum and below the bladder. The bladder stores urine and the urethra is the tube that carries the urine out of the body. The prostate may get bigger as a man gets older. An enlarged prostate can press on the urethra. This can make it harder to pass urine. The build-up of urine in the bladder can cause infection. Back pressure and infection may progress  to bladder damage and kidney (renal) failure. What are the causes? This condition is part of a normal aging process. However, not all men develop problems from this condition. If the prostate enlarges away from the urethra, urine flow will not be blocked. If it enlarges toward the urethra and compresses it, there will be problems passing  urine. What increases the risk? This condition is more likely to develop in men over the age of 19 years. What are the signs or symptoms? Symptoms of this condition include:  Getting up often during the night to urinate.  Needing to urinate frequently during the day.  Difficulty starting urine flow.  Decrease in size and strength of your urine stream.  Leaking (dribbling) after urinating.  Inability to pass urine. This needs immediate treatment.  Inability to completely empty your bladder.  Pain when you pass urine. This is more common if there is also an infection.  Urinary tract infection (UTI).  How is this diagnosed? This condition is diagnosed based on your medical history, a physical exam, and your symptoms. Tests will also be done, such as:  A post-void bladder scan. This measures any amount of urine that may remain in your bladder after you finish urinating.  A digital rectal exam. In a rectal exam, your health care provider checks your prostate by putting a lubricated, gloved finger into your rectum to feel the back of your prostate gland. This exam detects the size of your gland and any abnormal lumps or growths.  An exam of your urine (urinalysis).  A prostate specific antigen (PSA) screening. This is a blood test used to screen for prostate cancer.  An ultrasound. This test uses sound waves to electronically produce a picture of your prostate gland.  Your health care provider may refer you to a specialist in kidney and prostate diseases (urologist). How is this treated? Once symptoms begin, your health care provider will monitor your condition (active surveillance or watchful waiting). Treatment for this condition will depend on the severity of your condition. Treatment may include:  Observation and yearly exams. This may be the only treatment needed if your condition and symptoms are mild.  Medicines to relieve your symptoms, including: ? Medicines to shrink  the prostate. ? Medicines to relax the muscle of the prostate.  Surgery in severe cases. Surgery may include: ? Prostatectomy. In this procedure, the prostate tissue is removed completely through an open incision or with a laparascope or robotics. ? Transurethral resection of the prostate (TURP). In this procedure, a tool is inserted through the opening at the tip of the penis (urethra). It is used to cut away tissue of the inner core of the prostate. The pieces are removed through the same opening of the penis. This removes the blockage. ? Transurethral incision (TUIP). In this procedure, small cuts are made in the prostate. This lessens the prostate's pressure on the urethra. ? Transurethral microwave thermotherapy (TUMT). This procedure uses microwaves to create heat. The heat destroys and removes a small amount of prostate tissue. ? Transurethral needle ablation (TUNA). This procedure uses radio frequencies to destroy and remove a small amount of prostate tissue. ? Interstitial laser coagulation ([place]). This procedure uses a laser to destroy and remove a small amount of prostate tissue. ? Transurethral electrovaporization (TUVP). This procedure uses electrodes to destroy and remove a small amount of prostate tissue. ? Prostatic urethral lift. This procedure inserts an implant to push the lobes of the prostate away from the urethra.  Follow these instructions at home:  Take over-the-counter and prescription medicines only as told by your health care provider.  Monitor your symptoms for any changes. Contact your health care provider with any changes.  Avoid drinking large amounts of liquid before going to bed or out in public.  Avoid or reduce how much caffeine or alcohol you drink.  Give yourself time when you urinate.  Keep all follow-up visits as told by your health care provider. This is important. Contact a health care provider if:  You have unexplained back pain.  Your symptoms  do not get better with treatment.  You develop side effects from the medicine you are taking.  Your urine becomes very dark or has a bad smell.  Your lower abdomen becomes distended and you have trouble passing your urine. Get help right away if:  You have a fever or chills.  You suddenly cannot urinate.  You feel lightheaded, or very dizzy, or you faint.  There are large amounts of blood or clots in the urine.  Your urinary problems become hard to manage.  You develop moderate to severe low back or flank pain. The flank is the side of your body between the ribs and the hip. These symptoms may represent a serious problem that is an emergency. Do not wait to see if the symptoms will go away. Get medical help right away. Call your local emergency services (911 in the [place] Do not drive yourself to the hospital. Summary  Benign prostatic hyperplasia (BPH) is an enlarged prostate that is caused by the normal aging process and not by cancer.  An enlarged prostate can press on the urethra. This can make it hard to pass urine.  This condition is part of a normal aging process and is more likely to develop in men over the age of 16 years.  Get help right away if you suddenly cannot urinate. This information is not intended to replace advice given to you by your health care provider. Make sure you discuss any questions you have with your health care provider. Document Released: 10/12/2005 Document Revised: 11/16/2016 Document Reviewed: 11/16/2016 Elsevier Interactive Patient Education  2018 [place] Vaccine (Diphtheria, Tetanus, and Pertussis): What You Need to Know 1. Why get vaccinated? Diphtheria, tetanus, and pertussis are serious diseases caused by bacteria. Diphtheria and pertussis are spread from person to person. Tetanus enters the body through cuts or wounds. DIPHTHERIA causes a thick covering in the back of the throat.  It can lead to breathing problems,  paralysis, heart failure, and even death.  TETANUS (Lockjaw) causes painful tightening of the muscles, usually all over the body.  It can lead to "locking" of the jaw so the victim cannot open his mouth or swallow. Tetanus leads to death in up to 2 out of 10 cases.  PERTUSSIS (Whooping Cough) causes coughing spells so bad that it is hard for infants to eat, drink, or breathe. These spells can last for weeks.  It can lead to pneumonia, seizures (jerking and staring spells), brain damage, and death.  Diphtheria, tetanus, and pertussis vaccine (DTaP) can help prevent these diseases. Most children who are vaccinated with DTaP will be protected throughout childhood. Many more children would get these diseases if we stopped vaccinating. DTaP is a safer version of an older vaccine called DTP. DTP is no longer used in the [place]. 2. Who should get DTaP vaccine and when? Children should get 5 doses of DTaP vaccine, one dose at  each of the following ages:  2 months  4 months  6 months  15-18 months  4-6 years  DTaP may be given at the same time as other vaccines. 3. Some children should not get DTaP vaccine or should wait  Children with minor illnesses, such as a cold, may be vaccinated. But children who are moderately or severely ill should usually wait until they recover before getting DTaP vaccine.  Any child who had a life-threatening allergic reaction after a dose of DTaP should not get another dose.  Any child who suffered a brain or nervous system disease within 7 days after a dose of DTaP should not get another dose.  Talk with your doctor if your child: ? had a seizure or collapsed after a dose of DTaP, ? cried non-stop for 3 hours or more after a dose of DTaP, ? had a fever over 105F after a dose of DTaP. Ask your doctor for more information. Some of these children should not get another dose of pertussis vaccine, but may get a vaccine without pertussis, called DT. 4.  Older children and adults DTaP is not licensed for adolescents, adults, or children 92 years of age and older. But older people still need protection. A vaccine called Tdap is similar to DTaP. A single dose of Tdap is recommended for people 11 through 66 years of age. Another vaccine, called Td, protects against tetanus and diphtheria, but not pertussis. It is recommended every 10 years. There are separate Vaccine Information Statements for these vaccines. 5. What are the risks from DTaP vaccine? Getting diphtheria, tetanus, or pertussis disease is much riskier than getting DTaP vaccine. However, a vaccine, like any medicine, is capable of causing serious problems, such as severe allergic reactions. The risk of DTaP vaccine causing serious harm, or death, is extremely small. Mild problems (common)  Fever (up to about 1 child in 4)  Redness or swelling where the shot was given (up to about 1 child in 4)  Soreness or tenderness where the shot was given (up to about 1 child in 4) These problems occur more often after the 4th and 5th doses of the DTaP series than after earlier doses. Sometimes the 4th or 5th dose of DTaP vaccine is followed by swelling of the entire arm or leg in which the shot was given, lasting 1-7 days (up to about 1 child in 29). Other mild problems include:  Fussiness (up to about 1 child in 3)  Tiredness or poor appetite (up to about 1 child in 10)  Vomiting (up to about 1 child in 71) These problems generally occur 1-3 days after the shot. Moderate problems (uncommon)  Seizure (jerking or staring) (about 1 child out of 14,000)  Non-stop crying, for 3 hours or more (up to about 1 child out of 1,000)  High fever, over 105F (about 1 child out of 16,000) Severe problems (very rare)  Serious allergic reaction (less than 1 out of a million doses)  Several other severe problems have been reported after DTaP vaccine. These include: ? Long-term seizures, coma, or lowered  consciousness ? Permanent brain damage. These are so rare it is hard to tell if they are caused by the vaccine. Controlling fever is especially important for children who have had seizures, for any reason. It is also important if another family member has had seizures. You can reduce fever and pain by giving your child an aspirin-free pain reliever when the shot is given, and for the  next 24 hours, following the package instructions. 6. What if there is a serious reaction? What should I look for? Look for anything that concerns you, such as signs of a severe allergic reaction, very high fever, or behavior changes. Signs of a severe allergic reaction can include hives, swelling of the face and throat, difficulty breathing, a fast heartbeat, dizziness, and weakness. These would start a few minutes to a few hours after the vaccination. What should I do?  If you think it is a severe allergic reaction or other emergency that can't wait, call 9-1-1 or get the person to the nearest hospital. Otherwise, call your doctor.  Afterward, the reaction should be reported to the Vaccine Adverse Event Reporting System (VAERS). Your doctor might file this report, or you can do it yourself through the VAERS web site at www.vaers.SamedayNews.es, or by calling 407-568-5800. ? VAERS is only for reporting reactions. They do not give medical advice. 7. The National Vaccine Injury Compensation Program The [employer] Vaccine Injury Compensation Program (VICP) is a federal program that was created to compensate people who may have been injured by certain vaccines. Persons who believe they may have been injured by a vaccine can learn about the program and about filing a claim by calling 858 083 2255 or visiting the [place] website at GoldCloset.com.ee. 8. How can I learn more?  Ask your doctor.  Call your local or state health department.  Contact the Centers for Disease Control and Prevention (CDC): ? Call  (801)272-8697 (1-800-CDC-INFO) or ? Visit CDC's website at http://hunter.com/ CDC DTaP Vaccine (Diphtheria, Tetanus, and Pertussis) VIS (03/11/06) This information is not intended to replace advice given to you by your health care provider. Make sure you discuss any questions you have with your health care provider. Document Released: 08/09/2006 Document Revised: 07/02/2016 Document Reviewed: 07/02/2016 Elsevier Interactive Patient Education  2017 [employer].  Hepatitis A; Hepatitis B Vaccine injection What is this medicine? HEPATITIS A VACCINE; HEPATITIS B VACCINE (hep uh TAHY tis A vak SEEN; hep uh TAHY tis B vak SEEN) is a vaccine to protect from an infection with the hepatitis A and B virus. This vaccine does not contain the live viruses. It will not cause a hepatitis infection. This medicine may be used for other purposes; ask your health care provider or pharmacist if you have questions. COMMON BRAND NAME(S): Twinrix What should I tell my health care provider before I take this medicine? They need to know if you have any of these conditions: -bleeding disorder -fever or infection -heart disease -immune system problems -an unusual or allergic reaction to hepatitis A or B vaccine, neomycin, yeast, thimerosal, other medicines, foods, dyes, or preservatives -pregnant or trying to get pregnant -breast-feeding How should I use this medicine? This vaccine is for injection into a muscle. It is given by a health care professional. A copy of Vaccine Information Statements will be given before each vaccination. Read this sheet carefully each time. The sheet may change frequently. Talk to your pediatrician regarding the use of this medicine in children. Special care may be needed. Overdosage: If you think you have taken too much of this medicine contact a poison control center or emergency room at once. NOTE: This medicine is only for you. Do not share this medicine with others. What if I  miss a dose? It is important not to miss your dose. Call your doctor or health care professional if you are unable to keep an appointment. What may interact with this medicine? -medicines that  suppress your immune function like adalimumab, anakinra, infliximab -medicines to treat cancer -steroid medicines like prednisone or cortisone This list may not describe all possible interactions. Give your health care provider a list of all the medicines, herbs, non-prescription drugs, or dietary supplements you use. Also tell them if you smoke, drink alcohol, or use illegal drugs. Some items may interact with your medicine. What should I watch for while using this medicine? See your health care provider for all shots of this vaccine as directed. You must have 3 to 4 shots of this vaccine for protection from hepatitis A and B infection. Tell your doctor right away if you have any serious or unusual side effects after getting this vaccine. What side effects may I notice from receiving this medicine? Side effects that you should report to your doctor or health care professional as soon as possible: -allergic reactions like skin rash, itching or hives, swelling of the face, lips, or tongue -breathing problems -confused, irritated -fast, irregular heartbeat -flu-like syndrome -numb, tingling pain -seizures Side effects that usually do not require medical attention (report to your doctor or health care professional if they continue or are bothersome): -diarrhea -fever -headache -loss of appetite -muscle pain -nausea -pain, redness, swelling, or irritation at site where injected -tiredness This list may not describe all possible side effects. Call your doctor for medical advice about side effects. You may report side effects to FDA at 1-800-FDA-1088. Where should I keep my medicine? This drug is given in a hospital or clinic and will not be stored at home. NOTE: This sheet is a summary. It may not  cover all possible information. If you have questions about this medicine, talk to your doctor, pharmacist, or health care provider.  2018 Elsevier/Gold Standard (2008-02-24 15:21:37)

## 2018-03-08 ENCOUNTER — Encounter: Payer: Self-pay | Admitting: Internal Medicine

## 2018-03-08 ENCOUNTER — Encounter (INDEPENDENT_AMBULATORY_CARE_PROVIDER_SITE_OTHER): Payer: Self-pay

## 2018-03-16 DIAGNOSIS — D229 Melanocytic nevi, unspecified: Secondary | ICD-10-CM | POA: Diagnosis not present

## 2018-03-16 DIAGNOSIS — D225 Melanocytic nevi of trunk: Secondary | ICD-10-CM | POA: Diagnosis not present

## 2018-03-16 DIAGNOSIS — Z1283 Encounter for screening for malignant neoplasm of skin: Secondary | ICD-10-CM | POA: Diagnosis not present

## 2018-03-16 DIAGNOSIS — L812 Freckles: Secondary | ICD-10-CM | POA: Diagnosis not present

## 2018-03-16 DIAGNOSIS — L304 Erythema intertrigo: Secondary | ICD-10-CM | POA: Diagnosis not present

## 2018-03-16 DIAGNOSIS — D1800 Hemangioma unspecified site: Secondary | ICD-10-CM | POA: Diagnosis not present

## 2018-03-16 DIAGNOSIS — L820 Inflamed seborrheic keratosis: Secondary | ICD-10-CM | POA: Diagnosis not present

## 2018-03-16 DIAGNOSIS — D485 Neoplasm of uncertain behavior of skin: Secondary | ICD-10-CM | POA: Diagnosis not present

## 2018-03-16 DIAGNOSIS — L570 Actinic keratosis: Secondary | ICD-10-CM | POA: Diagnosis not present

## 2018-03-16 DIAGNOSIS — D219 Benign neoplasm of connective and other soft tissue, unspecified: Secondary | ICD-10-CM | POA: Diagnosis not present

## 2018-03-16 DIAGNOSIS — L821 Other seborrheic keratosis: Secondary | ICD-10-CM | POA: Diagnosis not present

## 2018-03-16 DIAGNOSIS — L219 Seborrheic dermatitis, unspecified: Secondary | ICD-10-CM | POA: Diagnosis not present

## 2018-03-23 ENCOUNTER — Other Ambulatory Visit: Payer: Self-pay | Admitting: Family Medicine

## 2018-03-24 NOTE — Telephone Encounter (Signed)
Patient was last seen by Dr. Aundra Dubin on 03-03-18 this was last ordered on 06-12-17 by Dr. Lacinda Axon. Can this be refilled

## 2018-03-30 ENCOUNTER — Other Ambulatory Visit: Payer: Self-pay | Admitting: Internal Medicine

## 2018-03-30 DIAGNOSIS — K219 Gastro-esophageal reflux disease without esophagitis: Secondary | ICD-10-CM

## 2018-03-30 MED ORDER — OMEPRAZOLE 40 MG PO CPDR: 40.0000 mg | DELAYED_RELEASE_CAPSULE | Freq: Every day | ORAL | 3 refills | Status: DC

## 2018-03-31 ENCOUNTER — Ambulatory Visit (INDEPENDENT_AMBULATORY_CARE_PROVIDER_SITE_OTHER): Payer: Medicare Other

## 2018-03-31 DIAGNOSIS — Z23 Encounter for immunization: Secondary | ICD-10-CM | POA: Diagnosis not present

## 2018-03-31 NOTE — Progress Notes (Signed)
Twinrix injection given in left deltoid. Patient tolerated well.

## 2018-04-02 ENCOUNTER — Encounter: Payer: Self-pay | Admitting: Internal Medicine

## 2018-04-06 ENCOUNTER — Ambulatory Visit
Admission: RE | Admit: 2018-04-06 | Discharge: 2018-04-06 | Disposition: A | Discharge status: Home / Self Care | Payer: Medicare Other | Source: Ambulatory Visit | Attending: Internal Medicine | Admitting: Internal Medicine

## 2018-04-06 DIAGNOSIS — R5383 Other fatigue: Secondary | ICD-10-CM | POA: Diagnosis not present

## 2018-04-06 DIAGNOSIS — R0789 Other chest pain: Secondary | ICD-10-CM | POA: Insufficient documentation

## 2018-04-06 DIAGNOSIS — E041 Nontoxic single thyroid nodule: Secondary | ICD-10-CM | POA: Insufficient documentation

## 2018-04-06 DIAGNOSIS — E042 Nontoxic multinodular goiter: Secondary | ICD-10-CM | POA: Diagnosis not present

## 2018-04-06 IMAGING — CT CT CHEST W/O CM
2 of 4 series · 15 of 36 positions shown, 18 images · non-contrast
Comparison: None.

CLINICAL DATA: Worsening fatigue for the past 2-3 months. Chest
wall pain.

EXAM:
CT CHEST WITHOUT CONTRAST
TECHNIQUE: Multidetector CT imaging of the chest was performed following the
standard protocol without IV contrast.

[Series 2: chest · axial · 0.73mm/px · z∈[-1240,-938]mm · 12 of 179 slices shown, 15 images (1 of 2)]
[im 14/179  mediastinal]
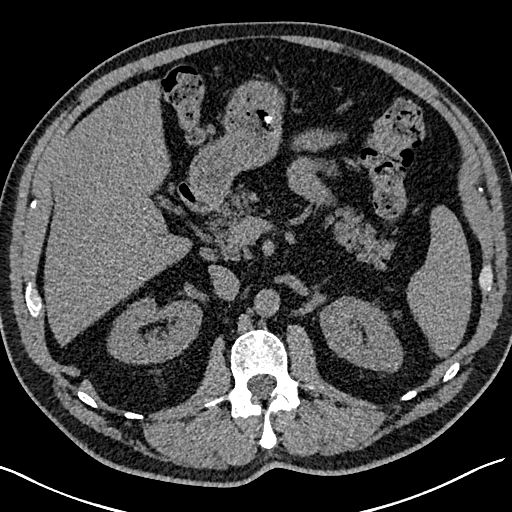
[im 14/179  lung]
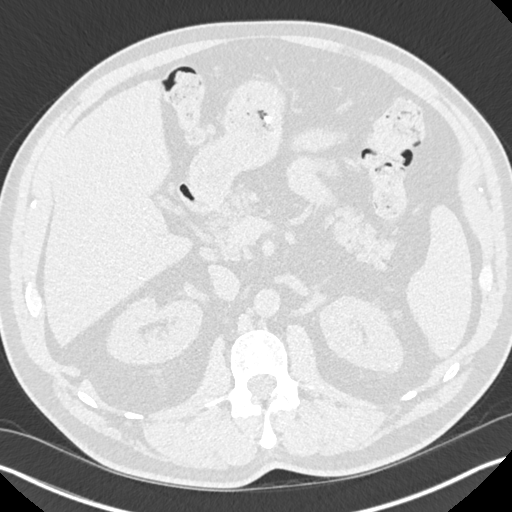
[im 28/179  lung]
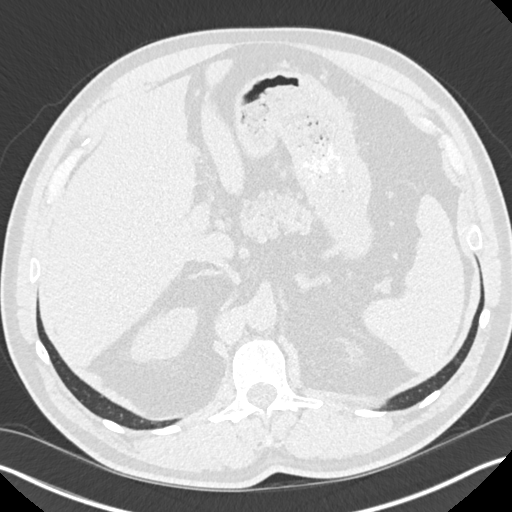
[im 42/179  lung]
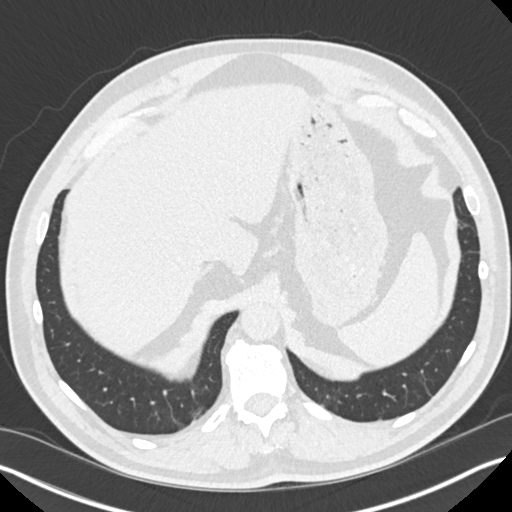
[im 55/179  lung]
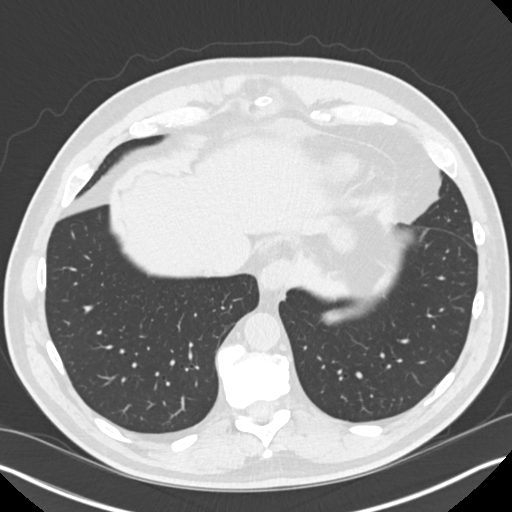
[im 69/179  mediastinal]
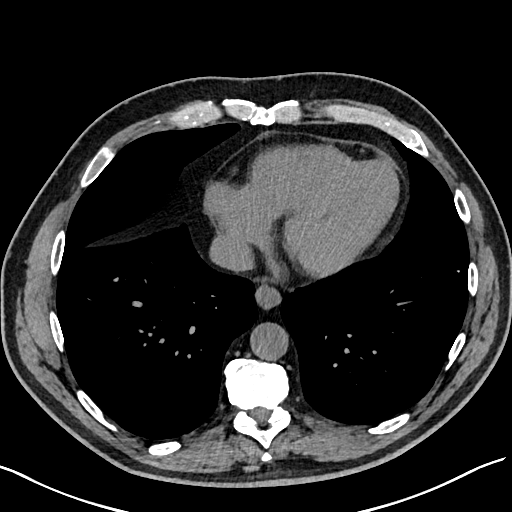
[im 69/179  lung]
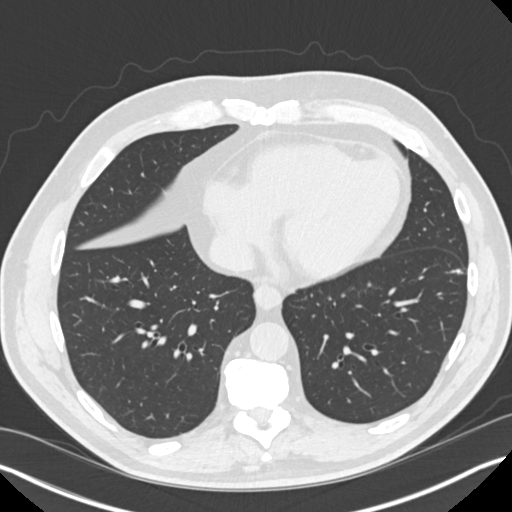
[im 83/179  lung]
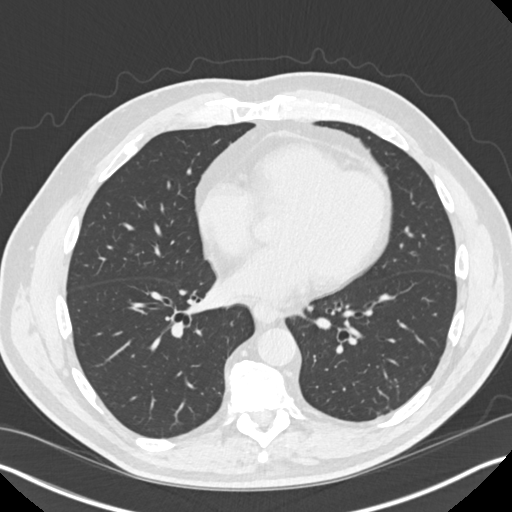
[im 96/179  lung]
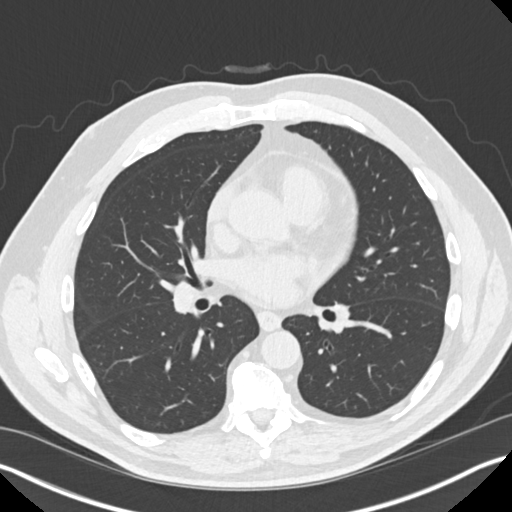
[im 110/179  lung]
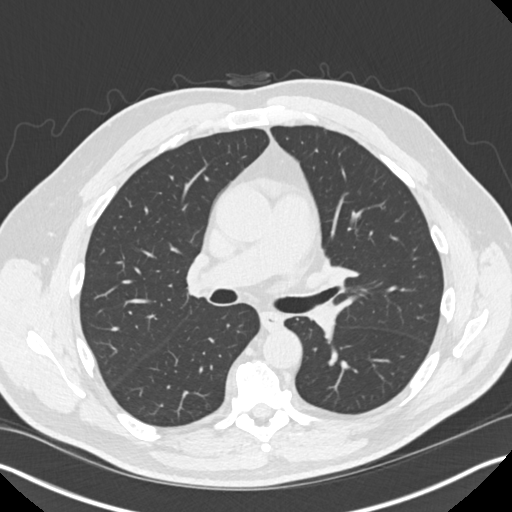
[im 124/179  mediastinal]
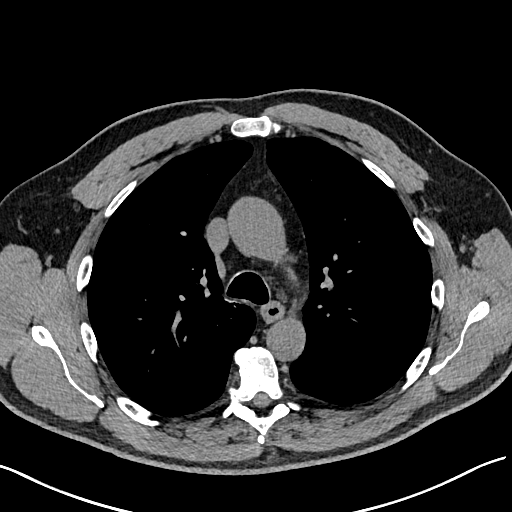
[im 124/179  lung]
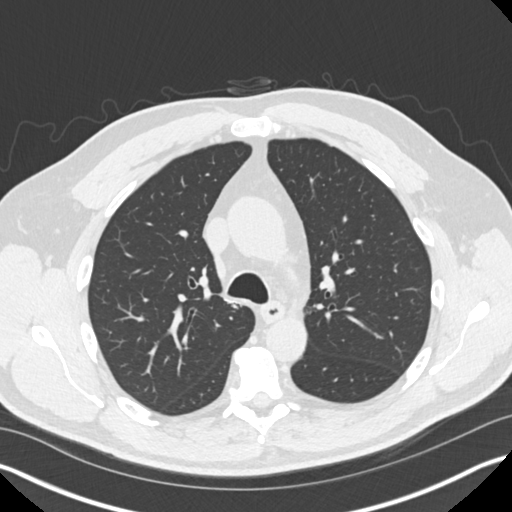
[im 137/179  lung]
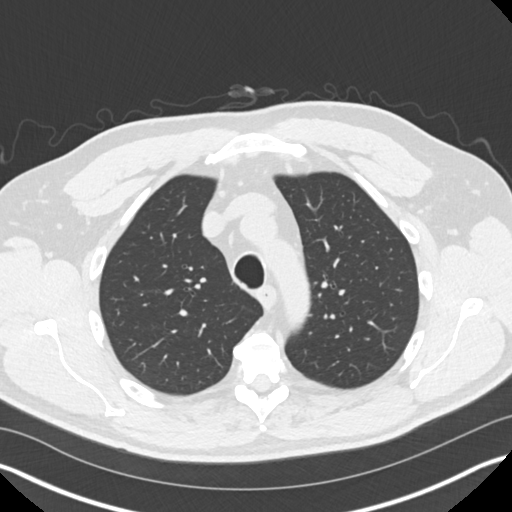
[im 151/179  lung]
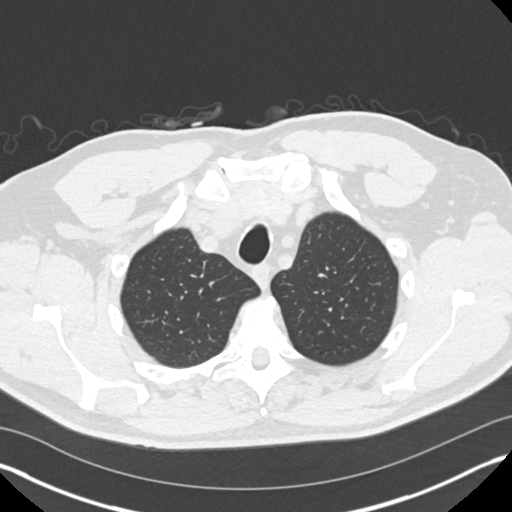
[im 165/179  lung]
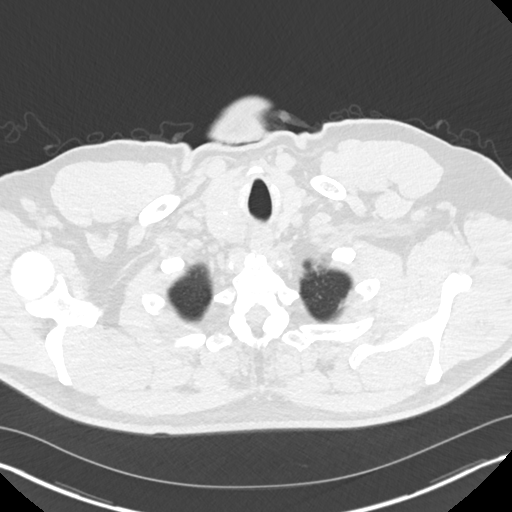

[Series 5: chest · coronal · 0.70mm/px · 3 of 175 slices shown (2 of 2)]
[im 35/175  lung]
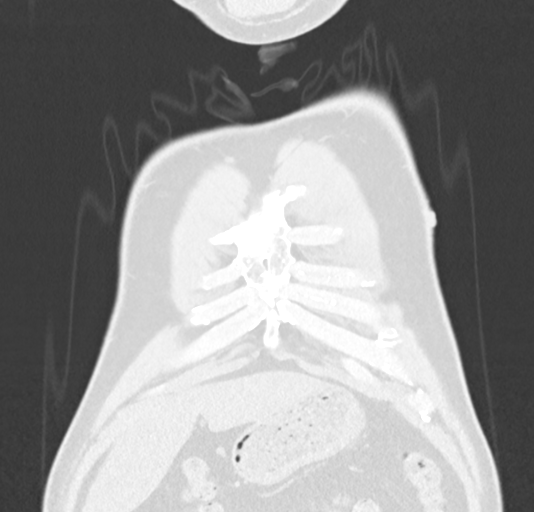
[im 70/175  lung]
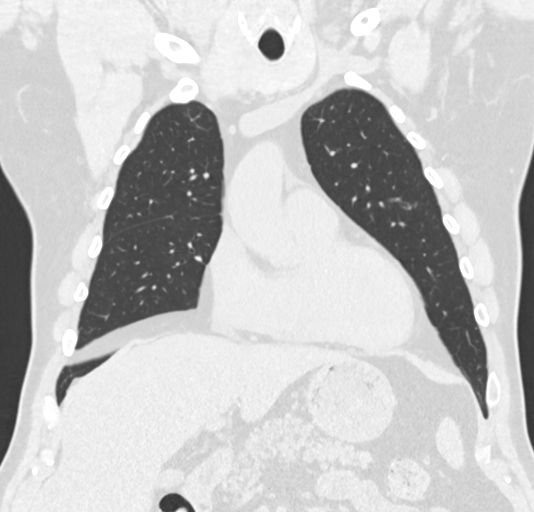
[im 105/175  lung]
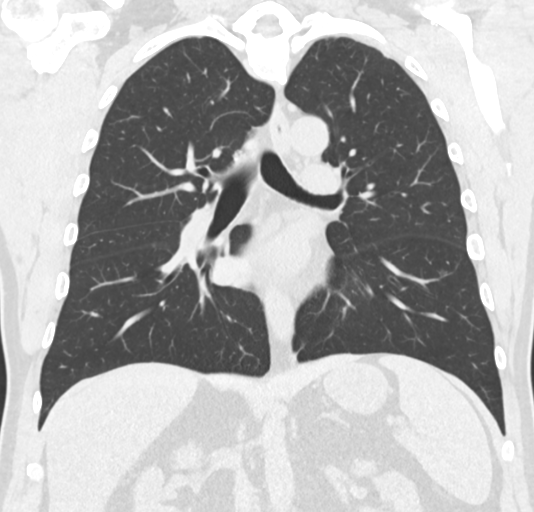

[15 of 36 positions shown; findings below may reference images not displayed]

FINDINGS: Cardiovascular: Normal heart size. Trace pericardial
thickening/fluid. Normal caliber thoracic aorta. Minimal
atherosclerotic calcification of the aortic valve.

Mediastinum/Nodes: No enlarged mediastinal or axillary lymph nodes.
Enlarged, nodular right thyroid lobe with coarse calcifications. The
trachea and esophagus demonstrate no significant findings.

Lungs/Pleura: No focal consolidation, pleural effusion, or
pneumothorax. Small focal areas of scarring in the lingula and left
lower lobe. No suspicious pulmonary nodule.

Upper Abdomen: No acute abnormality. 2.0 cm simple cyst in the
midpole of the right kidney. Subcentimeter lesions in the upper
poles of both kidneys are too small to characterize.

Musculoskeletal: No chest wall mass or suspicious bone lesions
identified.
IMPRESSION: 1.  No acute intrathoracic process.
2. Enlarged, nodular right thyroid lobe. Please see thyroid
ultrasound from same day for further evaluation.

## 2018-04-07 ENCOUNTER — Telehealth: Payer: Self-pay

## 2018-04-07 NOTE — Telephone Encounter (Signed)
-----   Message from Romana Juniper, RN sent at 04/07/2018  3:53 PM EDT ----- Patient notified of [place] results- he is fine with the referral. He will be out of town next week- but will return the following weekend.

## 2018-04-07 NOTE — Telephone Encounter (Signed)
Patient notified of lab results and would also like the referral to endocrine.

## 2018-04-08 ENCOUNTER — Other Ambulatory Visit: Payer: Self-pay | Admitting: Family Medicine

## 2018-04-08 ENCOUNTER — Other Ambulatory Visit: Payer: Self-pay | Admitting: Internal Medicine

## 2018-04-08 DIAGNOSIS — E041 Nontoxic single thyroid nodule: Secondary | ICD-10-CM

## 2018-04-12 MED ORDER — OMEPRAZOLE 40 MG PO CPDR: 40.0000 mg | DELAYED_RELEASE_CAPSULE | Freq: Every day | ORAL | 3 refills | Status: DC

## 2018-04-12 MED ORDER — METFORMIN HCL 1000 MG PO TABS: 1000.0000 mg | ORAL_TABLET | Freq: Two times a day (BID) | ORAL | 3 refills | Status: DC

## 2018-04-12 NOTE — Telephone Encounter (Signed)
Okay to refill? Last refilled in 2/18 by Dr. Lacinda Axon for #[id number] with no refills. Next appt in July.

## 2018-04-21 DIAGNOSIS — E042 Nontoxic multinodular goiter: Secondary | ICD-10-CM | POA: Insufficient documentation

## 2018-05-17 DIAGNOSIS — E042 Nontoxic multinodular goiter: Secondary | ICD-10-CM | POA: Diagnosis not present

## 2018-06-16 DIAGNOSIS — L821 Other seborrheic keratosis: Secondary | ICD-10-CM | POA: Diagnosis not present

## 2018-06-16 DIAGNOSIS — L304 Erythema intertrigo: Secondary | ICD-10-CM | POA: Diagnosis not present

## 2018-06-16 DIAGNOSIS — L219 Seborrheic dermatitis, unspecified: Secondary | ICD-10-CM | POA: Diagnosis not present

## 2018-06-16 DIAGNOSIS — L739 Follicular disorder, unspecified: Secondary | ICD-10-CM | POA: Diagnosis not present

## 2018-06-16 DIAGNOSIS — L820 Inflamed seborrheic keratosis: Secondary | ICD-10-CM | POA: Diagnosis not present

## 2018-06-16 DIAGNOSIS — L570 Actinic keratosis: Secondary | ICD-10-CM | POA: Diagnosis not present

## 2018-06-20 ENCOUNTER — Other Ambulatory Visit (INDEPENDENT_AMBULATORY_CARE_PROVIDER_SITE_OTHER): Payer: Medicare Other

## 2018-06-20 DIAGNOSIS — E119 Type 2 diabetes mellitus without complications: Secondary | ICD-10-CM | POA: Diagnosis not present

## 2018-06-20 DIAGNOSIS — Z125 Encounter for screening for malignant neoplasm of prostate: Secondary | ICD-10-CM

## 2018-06-20 DIAGNOSIS — Z1159 Encounter for screening for other viral diseases: Secondary | ICD-10-CM

## 2018-06-20 DIAGNOSIS — N400 Enlarged prostate without lower urinary tract symptoms: Secondary | ICD-10-CM

## 2018-06-20 DIAGNOSIS — Z0184 Encounter for antibody response examination: Secondary | ICD-10-CM | POA: Diagnosis not present

## 2018-06-20 DIAGNOSIS — R5383 Other fatigue: Secondary | ICD-10-CM

## 2018-06-20 LAB — CBC WITH DIFFERENTIAL/PLATELET
Basophils Absolute: 0 10*3/uL (ref 0.0–0.1)
Basophils Relative: 0.6 % (ref 0.0–3.0)
Eosinophils Absolute: 0.3 10*3/uL (ref 0.0–0.7)
Eosinophils Relative: 3.2 % (ref 0.0–5.0)
HCT: 40.9 % (ref 39.0–52.0)
Hemoglobin: 13.3 g/dL (ref 13.0–17.0)
Lymphocytes Relative: 30 % (ref 12.0–46.0)
Lymphs Abs: 2.4 10*3/uL (ref 0.7–4.0)
MCHC: 32.4 g/dL (ref 30.0–36.0)
MCV: 80.7 fl (ref 78.0–100.0)
Monocytes Absolute: 0.6 10*3/uL (ref 0.1–1.0)
Monocytes Relative: 7.1 % (ref 3.0–12.0)
Neutro Abs: 4.8 10*3/uL (ref 1.4–7.7)
Neutrophils Relative %: 59.1 % (ref 43.0–77.0)
Platelets: 236 10*3/uL (ref 150.0–400.0)
RBC: 5.07 Mil/uL (ref 4.22–5.81)
RDW: 16.9 % — ABNORMAL HIGH (ref 11.5–15.5)
WBC: 8.1 10*3/uL (ref 4.0–10.5)

## 2018-06-20 LAB — COMPREHENSIVE METABOLIC PANEL
ALT: 24 U/L (ref 0–53)
AST: 18 U/L (ref 0–37)
Albumin: 4.2 g/dL (ref 3.5–5.2)
Alkaline Phosphatase: 43 U/L (ref 39–117)
BUN: 16 mg/dL (ref 6–23)
CO2: 29 mEq/L (ref 19–32)
Calcium: 9.8 mg/dL (ref 8.4–10.5)
Chloride: 102 mEq/L (ref 96–112)
Creatinine, Ser: 1.23 mg/dL (ref 0.40–1.50)
GFR: 62.6 mL/min (ref >60.00)
Glucose, Bld: 121 mg/dL — ABNORMAL HIGH (ref 70–99)
Potassium: 4.6 mEq/L (ref 3.5–5.1)
Sodium: 138 mEq/L (ref 135–145)
Total Bilirubin: 0.4 mg/dL (ref 0.2–1.2)
Total Protein: 6.6 g/dL (ref 6.0–8.3)

## 2018-06-20 LAB — LIPID PANEL
Cholesterol: 208 mg/dL — ABNORMAL HIGH (ref 0–200)
HDL: 29.2 mg/dL — ABNORMAL LOW (ref >39.00)
NonHDL: 178.98
Total CHOL/HDL Ratio: 7
Triglycerides: 258 mg/dL — ABNORMAL HIGH (ref 0.0–149.0)
VLDL: 51.6 mg/dL — ABNORMAL HIGH (ref 0.0–40.0)

## 2018-06-20 LAB — LDL CHOLESTEROL, DIRECT: Direct LDL: 153 mg/dL

## 2018-06-20 LAB — MICROALBUMIN / CREATININE URINE RATIO
Creatinine,U: 82.9 mg/dL
Microalb Creat Ratio: 0.8 mg/g (ref 0.0–30.0)
Microalb, Ur: <0.7 mg/dL (ref 0.0–1.9)

## 2018-06-20 LAB — PSA: PSA: 2.92 ng/mL (ref 0.10–4.00)

## 2018-06-20 LAB — HEMOGLOBIN A1C: Hgb A1c MFr Bld: 7 % — ABNORMAL HIGH (ref 4.6–6.5)

## 2018-06-21 LAB — MEASLES/MUMPS/RUBELLA IMMUNITY
Mumps IgG: >300 AU/mL
Rubella: 17.7 index
Rubeola IgG: >300 AU/mL

## 2018-07-04 ENCOUNTER — Encounter: Payer: Self-pay | Admitting: Internal Medicine

## 2018-07-04 ENCOUNTER — Ambulatory Visit (INDEPENDENT_AMBULATORY_CARE_PROVIDER_SITE_OTHER): Payer: Medicare Other | Admitting: Internal Medicine

## 2018-07-04 VITALS — BP 126/70 | HR 68 | Temp 98.5°F | Ht 73.0 in | Wt 234.0 lb

## 2018-07-04 DIAGNOSIS — Z23 Encounter for immunization: Secondary | ICD-10-CM | POA: Diagnosis not present

## 2018-07-04 DIAGNOSIS — E1142 Type 2 diabetes mellitus with diabetic polyneuropathy: Secondary | ICD-10-CM

## 2018-07-04 DIAGNOSIS — R5383 Other fatigue: Secondary | ICD-10-CM

## 2018-07-04 DIAGNOSIS — I471 Supraventricular tachycardia: Secondary | ICD-10-CM | POA: Diagnosis not present

## 2018-07-04 DIAGNOSIS — J309 Allergic rhinitis, unspecified: Secondary | ICD-10-CM | POA: Diagnosis not present

## 2018-07-04 DIAGNOSIS — E785 Hyperlipidemia, unspecified: Secondary | ICD-10-CM | POA: Diagnosis not present

## 2018-07-04 DIAGNOSIS — E291 Testicular hypofunction: Secondary | ICD-10-CM

## 2018-07-04 MED ORDER — AZELASTINE HCL 0.1 % NA SOLN: 1.0000 | Freq: Two times a day (BID) | NASAL | 3 refills | Status: DC

## 2018-07-04 NOTE — Patient Instructions (Addendum)
Consider shingrix vaccine x 2 doses at your pharmacy Dr. Rockey Situ cardiology to disc. Cholesterol  Twinrix is due 09/01/18  If will sch lab visit testosterone if normal consider Narcolepsy or repeat sleep study  F/u in 3-4 months sooner if needed   Cholesterol Cholesterol is a white, waxy, fat-like substance that is needed by the human body in small amounts. The liver makes all the cholesterol we need. Cholesterol is carried from the liver by the blood through the blood vessels. Deposits of cholesterol (plaques) may build up on blood vessel (artery) walls. Plaques make the arteries narrower and stiffer. Cholesterol plaques increase the risk for heart attack and stroke. You cannot feel your cholesterol level even if it is very high. The only way to know that it is high is to have a blood test. Once you know your cholesterol levels, you should keep a record of the test results. Work with your health care provider to keep your levels in the desired range. What do the results mean?  Total cholesterol is a rough measure of all the cholesterol in your blood.  LDL (low-density lipoprotein) is the "bad" cholesterol. This is the type that causes plaque to build up on the artery walls. You want this level to be low.  HDL (high-density lipoprotein) is the "good" cholesterol because it cleans the arteries and carries the LDL away. You want this level to be high.  Triglycerides are fat that the body can either burn for energy or store. High levels are closely linked to heart disease. What are the desired levels of cholesterol?  Total cholesterol below 200.  LDL below 100 for people who are at risk, below 70 for people at very high risk.  HDL above 40 is good. A level of 60 or higher is considered to be protective against heart disease.  Triglycerides below 150. How can I lower my cholesterol? Diet Follow your diet program as told by your health care provider.  Choose fish or white meat chicken and  [place], roasted or baked. Limit fatty cuts of red meat, fried foods, and processed meats, such as sausage and lunch meats.  Eat lots of fresh fruits and vegetables.  Choose whole grains, beans, pasta, potatoes, and cereals.  Choose olive oil, corn oil, or canola oil, and use only small amounts.  Avoid butter, mayonnaise, shortening, or palm kernel oils.  Avoid foods with trans fats.  Drink skim or nonfat milk and eat low-fat or nonfat yogurt and cheeses. Avoid whole milk, cream, ice cream, egg yolks, and full-fat cheeses.  Healthier desserts include angel food cake, ginger snaps, animal crackers, hard candy, popsicles, and low-fat or nonfat frozen yogurt. Avoid pastries, cakes, pies, and cookies.  Exercise  Follow your exercise program as told by your health care provider. A regular program: ? Helps to decrease LDL and raise HDL. ? Helps with weight control.  Do things that increase your activity level, such as gardening, walking, and taking the stairs.  Ask your health care provider about ways that you can be more active in your daily life.  Medicine  Take over-the-counter and prescription medicines only as told by your health care provider. ? Medicine may be prescribed by your health care provider to help lower cholesterol and decrease the risk for heart disease. This is usually done if diet and exercise have failed to bring down cholesterol levels. ? If you have several risk factors, you may need medicine even if your levels are normal.  This information is  not intended to replace advice given to you by your health care provider. Make sure you discuss any questions you have with your health care provider. Document Released: 07/07/2001 Document Revised: 05/09/2016 Document Reviewed: 04/11/2016 Elsevier Interactive Patient Education  [employer].

## 2018-07-04 NOTE — Progress Notes (Signed)
Pre visit review using our clinic review tool, if applicable. No additional management support is needed unless otherwise documented below in the visit note. 

## 2018-07-04 NOTE — Progress Notes (Addendum)
Chief Complaint  Patient presents with  . Follow-up   F/u  1. Still c/o fatigue his daughter has narcolepsy and he used to be on provigil and adderrall. Also OSA on cpap auto but no sleep study since 1997  2. DM 2 A1C 7.0 on metformin  3. HLD declines statin and was on zetia in the past but not effective will refer to cardiology to disc Repatha 4. H/o tachycardia SVT on BB controlled   Review of Systems  Constitutional: Positive for malaise/fatigue. Negative for weight loss.  HENT: Negative for hearing loss.   Eyes: Negative for blurred vision.  Respiratory: Negative for shortness of breath.   Cardiovascular: Negative for chest pain and palpitations.  Gastrointestinal: Negative for abdominal pain and blood in stool.  Musculoskeletal: Negative for falls.  Skin: Negative for rash.  Neurological: Negative for headaches.  Psychiatric/Behavioral: Negative for depression.   Past Medical History:  Diagnosis Date  . Allergy   . Anemia   . Arthritis   . CAD (coronary artery disease)   . Cancer (HCC)    BASAL CELL-FACE  . Complication of anesthesia    PT IS A RED HEAD AND STATES IT TAKES ALOT MORE ANESTHESIA TO SEDATE HIM  . Diabetes mellitus without complication ([place])   . Dysrhythmia    SVT  . Fatty liver   . GERD (gastroesophageal reflux disease)   . Hyperlipidemia   . Multiple thyroid nodules   . Rheumatic fever   . Sleep apnea 1997   Uses C-Pap machine  . SVT (supraventricular tachycardia) ([place])   . Thyroid nodule    Past Surgical History:  Procedure Laterality Date  . APPENDECTOMY  1965  . BICEPS TENDON REPAIR Right 05/2015  . COLONOSCOPY  2016  . LUMBAR FUSION  2015   L4/L5  . RHINOPLASTY  1997  . TONSILLECTOMY  1956  . VASECTOMY  1981   Family History  Problem Relation Age of Onset  . Arthritis Mother   . Lung cancer Mother   . Hyperlipidemia Mother   . Diabetes Mother   . Arthritis Father   . Diabetes Father   . Lung disease Father   . Hyperlipidemia  Maternal Grandmother   . Alcohol abuse Maternal Grandfather   . Hyperlipidemia Maternal Grandfather   . Drug abuse Cousin    Social History   Socioeconomic History  . Marital status: Married    Spouse name: Not on file  . Number of children: Not on file  . Years of education: Not on file  . Highest education level: Not on file  Occupational History  . Not on file  Social Needs  . Financial resource strain: Not on file  . Food insecurity:    Worry: Not on file    Inability: Not on file  . Transportation needs:    Medical: Not on file    Non-medical: Not on file  Tobacco Use  . Smoking status: Never Smoker  . Smokeless tobacco: Former Network engineer and Sexual Activity  . Alcohol use: Yes    Comment: OCC  . Drug use: No  . Sexual activity: Not Currently  Lifestyle  . Physical activity:    Days per week: Not on file    Minutes per session: Not on file  . Stress: Not on file  Relationships  . Social connections:    Talks on phone: Not on file    Gets together: Not on file    Attends religious service: Not on file  Active member of club or organization: Not on file    Attends meetings of clubs or organizations: Not on file    Relationship status: Not on file  . Intimate partner violence:    Fear of current or ex partner: Not on file    Emotionally abused: Not on file    Physically abused: Not on file    Forced sexual activity: Not on file  Other Topics Concern  . Not on file  Social History Narrative   Married    Former Nature conservation officer    Current Meds  Medication Sig  . azelastine (ASTELIN) 0.1 % nasal spray Place 1 spray into both nostrils 2 (two) times daily. Use in each nostril as directed  . cetirizine (ZYRTEC) 10 MG tablet Take 10 mg by mouth at bedtime.   . Cinnamon 500 MG capsule Take 500 mg by mouth daily.  . Coenzyme Q10 (COQ10) 200 MG CAPS Take 200 mg by mouth.  . metFORMIN (GLUCOPHAGE) 1000 MG tablet Take 1 tablet (1,000 mg total) by mouth 2 (two) times  daily with a meal. .  . metoprolol succinate (TOPROL-XL) 25 MG 24 hr tablet Take 1 tablet (25 mg total) by mouth daily. Take with or immediately following a meal.  . Multiple Vitamins-Minerals (MULTIVITAMIN ADULT PO) Take by mouth.  . Omega-3 Fatty Acids (FISH OIL) 1200 MG CPDR Take 1 tablet by mouth daily.  Marland Kitchen omeprazole (PRILOSEC) 40 MG capsule Take 1 capsule (40 mg total) by mouth daily.  Marland Kitchen OVER THE COUNTER MEDICATION Osteo biflex  . sitaGLIPtin (JANUVIA) 50 MG tablet Take 1 tablet (50 mg total) by mouth daily.  . vitamin C (ASCORBIC ACID) 500 MG tablet Take 500 mg by mouth daily.  . vitamin E 100 UNIT capsule Take 500 Units by mouth daily.  . [DISCONTINUED] azelastine (ASTELIN) 0.1 % nasal spray Place 1 spray into both nostrils 2 (two) times daily. Use in each nostril as directed   Allergies  Allergen Reactions  . Statins Other (See Comments)    Myalgias   Recent Results (from the past 2160 hour(s))  Measles/Mumps/Rubella Immunity     Status: None   Collection Time: 06/20/18  7:57 AM  Result Value Ref Range   [place] IgG >300.00 AU/mL    Comment: AU/mL            Interpretation -----            -------------- <25.00           Negative 25.00-29.99      Equivocal >29.99           Positive . A positive result indicates that the patient has antibody to measles virus. It does not differentiate  between an active or past infection. The clinical  diagnosis must be interpreted in conjunction with  clinical signs and symptoms of the patient.    Mumps IgG >300.00 AU/mL    Comment:  AU/mL           Interpretation -------         ---------------- <9.00             Negative 9.00-10.99        Equivocal >10.99            Positive A positive result indicates that the patient has  antibody to mumps virus. It does not differentiate between an  active or past infection. The clinical diagnosis must be interpreted in conjunction with clinical signs and symptoms of the patient. Marland Kitchen  Rubella 17.70 index    Comment:     Index            Interpretation     -----            --------------       <0.90            Not consistent with Immunity     0.90-0.99        Equivocal     > or = 1.00      Consistent with Immunity  . The presence of rubella IgG antibody suggests  immunization or past or current infection with rubella virus.   PSA     Status: None   Collection Time: 06/20/18  7:57 AM  Result Value Ref Range   PSA 2.92 0.10 - 4.00 ng/mL    Comment: Test performed using Access Hybritech PSA Assay, a parmagnetic partical, chemiluminecent immunoassay.  Urine Microalbumin w/creat. ratio     Status: None   Collection Time: 06/20/18  7:57 AM  Result Value Ref Range   Microalb, Ur <0.7 0.0 - 1.9 mg/dL   Creatinine,U 82.9 mg/dL   Microalb Creat Ratio 0.8 0.0 - 30.0 mg/g  Lipid panel     Status: Abnormal   Collection Time: 06/20/18  7:57 AM  Result Value Ref Range   Cholesterol 208 (H) 0 - 200 mg/dL    Comment: ATP III Classification       Desirable:  < 200 mg/dL               Borderline High:  200 - 239 mg/dL          High:  > = 240 mg/dL   Triglycerides 258.0 (H) 0.0 - 149.0 mg/dL    Comment: Normal:  <150 mg/dLBorderline High:  150 - 199 mg/dL   HDL 29.20 (L) >39.00 mg/dL   VLDL 51.6 (H) 0.0 - 40.0 mg/dL   Total CHOL/HDL Ratio 7     Comment:                Men          Women1/2 Average Risk     3.4          3.3Average Risk          5.0          4.42X Average Risk          9.6          7.13X Average Risk          15.0          11.0                       NonHDL 178.98     Comment: NOTE:  Non-HDL goal should be 30 mg/dL higher than patient's LDL goal (i.e. LDL goal of < 70 mg/dL, would have non-HDL goal of < 100 mg/dL)  Hemoglobin A1c     Status: Abnormal   Collection Time: 06/20/18  7:57 AM  Result Value Ref Range   Hgb A1c MFr Bld 7.0 (H) 4.6 - 6.5 %    Comment: Glycemic Control Guidelines for People with Diabetes:Non Diabetic:  <6%Goal of Therapy: <7%Additional  Action Suggested:  >8%   CBC with Differential/Platelet     Status: Abnormal   Collection Time: 06/20/18  7:57 AM  Result Value Ref Range   WBC 8.1 4.0 - 10.5 K/uL   RBC 5.07 4.22 - 5.81  Mil/uL   Hemoglobin 13.3 13.0 - 17.0 g/dL   HCT 40.9 39.0 - 52.0 %   MCV 80.7 78.0 - 100.0 fl   MCHC 32.4 30.0 - 36.0 g/dL   RDW 16.9 (H) 11.5 - 15.5 %   Platelets 236.0 150.0 - 400.0 K/uL   Neutrophils Relative % 59.1 43.0 - 77.0 %   Lymphocytes Relative 30.0 12.0 - 46.0 %   Monocytes Relative 7.1 3.0 - 12.0 %   Eosinophils Relative 3.2 0.0 - 5.0 %   Basophils Relative 0.6 0.0 - 3.0 %   Neutro Abs 4.8 1.4 - 7.7 K/uL   Lymphs Abs 2.4 0.7 - 4.0 K/uL   Monocytes Absolute 0.6 0.1 - 1.0 K/uL   Eosinophils Absolute 0.3 0.0 - 0.7 K/uL   Basophils Absolute 0.0 0.0 - 0.1 K/uL  Comprehensive metabolic panel     Status: Abnormal   Collection Time: 06/20/18  7:57 AM  Result Value Ref Range   Sodium 138 135 - 145 mEq/L   Potassium 4.6 3.5 - 5.1 mEq/L   Chloride 102 96 - 112 mEq/L   CO2 29 19 - 32 mEq/L   Glucose, Bld 121 (H) 70 - 99 mg/dL   BUN 16 6 - 23 mg/dL   Creatinine, Ser 1.23 0.40 - 1.50 mg/dL   Total Bilirubin 0.4 0.2 - 1.2 mg/dL   Alkaline Phosphatase 43 39 - 117 U/L   AST 18 0 - 37 U/L   ALT 24 0 - 53 U/L   Total Protein 6.6 6.0 - 8.3 g/dL   Albumin 4.2 3.5 - 5.2 g/dL   Calcium 9.8 8.4 - 10.5 mg/dL   GFR 62.60 >60.00 mL/min  LDL cholesterol, direct     Status: None   Collection Time: 06/20/18  7:57 AM  Result Value Ref Range   Direct LDL 153.0 mg/dL    Comment: Optimal:  <100 mg/dLNear or Above Optimal:  100-129 mg/dLBorderline High:  130-159 mg/dLHigh:  160-189 mg/dLVery High:  >190 mg/dL   Objective  Body mass index is 30.87 kg/m. Wt Readings from Last 3 Encounters:  07/04/18 234 lb (106.1 kg)  03/03/18 232 lb 6.4 oz (105.4 kg)  02/17/18 232 lb 11.2 oz (105.6 kg)   Temp Readings from Last 3 Encounters:  07/04/18 98.5 F (36.9 C) (Oral)  03/03/18 98.3 F (36.8 C) (Oral)   01/20/18 98.2 F (36.8 C) (Oral)   BP Readings from Last 3 Encounters:  07/04/18 126/70  03/03/18 120/70  02/17/18 112/69   Pulse Readings from Last 3 Encounters:  07/04/18 68  03/03/18 67  02/17/18 65    Physical Exam  Constitutional: He is oriented to person, place, and time. Vital signs are normal. He appears well-developed and well-nourished. He is cooperative.  HENT:  Head: Normocephalic and atraumatic.  Mouth/Throat: Oropharynx is clear and moist and mucous membranes are normal.  Eyes: Pupils are equal, round, and reactive to light. Conjunctivae are normal.  Cardiovascular: Normal rate, regular rhythm and normal heart sounds.  Pulmonary/Chest: Effort normal and breath sounds normal.  Neurological: He is alert and oriented to person, place, and time. Gait normal.  Skin: Skin is warm, dry and intact.  Psychiatric: He has a normal mood and affect. His speech is normal and behavior is normal. Judgment and thought content normal. Cognition and memory are normal.  Nursing note and vitals reviewed.   Assessment   1. Fatigue ddx narcolepsy, hypogonadism, osa on cpap not correct settings  2. DM 2 A1C 7.0 3. HLD  4. H/o SVT and bradycardia HR normal today 68 h/o mild atherosclerosis aortic valve CT chest 04/06/18  5. HM Plan   1. Check testosterone if low urology f/u  Consider repeat sleep study and provigil  2. Cont meds consider low dose ACEI/ARB in future  Declines statin refer cards repatha consider  Foot exam at f/u  Eye exam had 10/20/17 neg  3. See above  4. Refer to cards Dr. Rockey Situ to f/u appears controlled  5.  Had flu shot today Tdap utd Had prevnar will need pna 23 in 1 year 12/2018 Consider shingrix in future  twinrix 2/3 last due 08/2018  hep C neg PSA 2.92 06/20/18 f/u urology Dr. Bernardo Heater PSA stable  Derm Dr. Laurence Ferrari LN2 arms, scalp f/u in 6 months from 05/2018  Colonoscopy 10/17/15 normal fair prep CT chest due 6/12020  Reviewed records Dr. Eliezer Champagne  pna  23 given 2013  Zoster 03/13/15  Colonoscopy 10/17/15  Normal fair prep  CXR 06/21/15 negative  H/o low ferritin 10.2 12/30/15 and iron def anemia  PSA 3.75 12/30/15, 3.60 09/23/15   Sleep study 09/14/18 sleep med mild OSA total AHI 10 per hr, cpap titration 9 cm h20 but turned down to 8  -rec wt loss , nasal congestion, CPAP at 8 cm h20 he also had high periodic limb movement index with only few arousals ?RLS +/- insomnia   -CPAP 8 cm H20 humidified, [place] Full face mask large measures to maintain airway address nasal congestion   Provider: Dr. Olivia Mackie [name]-Internal Medicine

## 2018-07-05 ENCOUNTER — Other Ambulatory Visit: Payer: Medicare Other

## 2018-07-05 DIAGNOSIS — E291 Testicular hypofunction: Secondary | ICD-10-CM | POA: Diagnosis not present

## 2018-07-06 LAB — TESTOSTERONE TOTAL,FREE,BIO, MALES
Albumin: 4.1 g/dL (ref 3.6–5.1)
Sex Hormone Binding: 33 nmol/L (ref 22–77)
Testosterone, Bioavailable: 108.1 ng/dL — ABNORMAL LOW (ref >=110.0)
Testosterone, Free: 57.4 pg/mL (ref 46.0–224.0)
Testosterone: 424 ng/dL (ref 250–827)

## 2018-07-07 ENCOUNTER — Other Ambulatory Visit: Payer: Self-pay | Admitting: Internal Medicine

## 2018-07-07 DIAGNOSIS — G4733 Obstructive sleep apnea (adult) (pediatric): Secondary | ICD-10-CM

## 2018-07-07 DIAGNOSIS — G4719 Other hypersomnia: Secondary | ICD-10-CM

## 2018-07-07 DIAGNOSIS — Z9989 Dependence on other enabling machines and devices: Secondary | ICD-10-CM

## 2018-08-11 ENCOUNTER — Telehealth: Payer: Self-pay | Admitting: Internal Medicine

## 2018-08-11 NOTE — Telephone Encounter (Signed)
Copied from [facility] [id number]. Topic: Appointment Scheduling - Scheduling Inquiry for Clinic >> Aug 11, 2018 12:34 PM Cecelia Byars, [place] wrote: Reason for CRM: Patient called to schedule his AWV  please call him at  [phone] ,

## 2018-08-22 ENCOUNTER — Encounter: Payer: Self-pay | Admitting: Internal Medicine

## 2018-08-23 NOTE — Telephone Encounter (Signed)
Scheduled pt for 11/7 for AWV with RN.

## 2018-08-24 ENCOUNTER — Ambulatory Visit: Payer: Medicare Other | Attending: Neurology

## 2018-08-24 DIAGNOSIS — Z9989 Dependence on other enabling machines and devices: Secondary | ICD-10-CM | POA: Diagnosis not present

## 2018-08-24 DIAGNOSIS — F5101 Primary insomnia: Secondary | ICD-10-CM | POA: Diagnosis not present

## 2018-08-24 DIAGNOSIS — G4733 Obstructive sleep apnea (adult) (pediatric): Secondary | ICD-10-CM | POA: Insufficient documentation

## 2018-08-24 DIAGNOSIS — G4719 Other hypersomnia: Secondary | ICD-10-CM | POA: Insufficient documentation

## 2018-09-01 ENCOUNTER — Ambulatory Visit: Payer: Medicare Other

## 2018-09-01 ENCOUNTER — Ambulatory Visit (INDEPENDENT_AMBULATORY_CARE_PROVIDER_SITE_OTHER): Payer: Medicare Other

## 2018-09-01 ENCOUNTER — Other Ambulatory Visit: Payer: Self-pay | Admitting: Internal Medicine

## 2018-09-01 VITALS — BP 130/70 | HR 69 | Temp 98.6°F | Resp 15 | Ht 72.5 in | Wt 227.1 lb

## 2018-09-01 DIAGNOSIS — Z0000 Encounter for general adult medical examination without abnormal findings: Secondary | ICD-10-CM | POA: Diagnosis not present

## 2018-09-01 DIAGNOSIS — Z23 Encounter for immunization: Secondary | ICD-10-CM | POA: Diagnosis not present

## 2018-09-01 DIAGNOSIS — E119 Type 2 diabetes mellitus without complications: Secondary | ICD-10-CM

## 2018-09-01 NOTE — Progress Notes (Signed)
Agree with note   tMS 

## 2018-09-01 NOTE — Patient Instructions (Addendum)
  [name] , Thank you for taking time to come for your Medicare Wellness Visit. I appreciate your ongoing commitment to your health goals. Please review the following plan we discussed and let me know if I can assist you in the future.   Follow up as needed.    Bring a copy of your [place] and/or Living Will to be scanned into chart upon completion.  Have a great day!  These are the goals we discussed: Goals    . Increase physical activity     Weight 190lb [employer] senior program       This is a list of the screening recommended for you and due dates:  Health Maintenance  Topic Date Due  . Complete foot exam   05/26/2017  . Eye exam for diabetics  10/20/2018  . Hemoglobin A1C  12/21/2018  . Pneumonia vaccines (2 of 2 - PPSV23) 01/21/2019  . Urine Protein Check  06/21/2019  . Colon Cancer Screening  10/26/2025  . Tetanus Vaccine  03/21/2028  . Flu Shot  Completed

## 2018-09-01 NOTE — Progress Notes (Signed)
Subjective:   Jackson [name] is a 67 y.o. male who presents for an Initial Medicare Annual Wellness Visit.  Review of Systems  No ROS.  Medicare Wellness Visit. Additional risk factors are reflected in the social history.  Cardiac Risk Factors include: advanced age (>77men, >76 women);hypertension;male gender;diabetes mellitus;obesity (BMI >30kg/m2)    Objective:    Today's Vitals   09/01/18 0938  BP: 130/70  Pulse: 69  Resp: 15  Temp: 98.6 F (37 C)  TempSrc: Oral  SpO2: 95%  Weight: 227 lb 1.9 oz (103 kg)  Height: 6' 0.5" (1.842 m)   Body mass index is 30.38 kg/m.  Advanced Directives 09/01/2018  Does Patient Have a Medical Advance Directive? No  Would patient like information on creating a medical advance directive? No - Patient declined    Current Medications (verified) Outpatient Encounter Medications as of 09/01/2018  Medication Sig  . azelastine (ASTELIN) 0.1 % nasal spray Place 1 spray into both nostrils 2 (two) times daily. Use in each nostril as directed  . cetirizine (ZYRTEC) 10 MG tablet Take 10 mg by mouth at bedtime.   . Cinnamon 500 MG capsule Take 500 mg by mouth daily.  . Coenzyme Q10 (COQ10) 200 MG CAPS Take 200 mg by mouth.  . metFORMIN (GLUCOPHAGE) 1000 MG tablet Take 1 tablet (1,000 mg total) by mouth 2 (two) times daily with a meal. .  . metoprolol succinate (TOPROL-XL) 25 MG 24 hr tablet Take 1 tablet (25 mg total) by mouth daily. Take with or immediately following a meal.  . Multiple Vitamins-Minerals (MULTIVITAMIN ADULT PO) Take by mouth.  . Omega-3 Fatty Acids (FISH OIL) 1200 MG CPDR Take 1 tablet by mouth daily.  Marland Kitchen omeprazole (PRILOSEC) 40 MG capsule Take 1 capsule (40 mg total) by mouth daily.  Marland Kitchen OVER THE COUNTER MEDICATION Osteo biflex  . sitaGLIPtin (JANUVIA) 50 MG tablet Take 1 tablet (50 mg total) by mouth daily.  . vitamin C (ASCORBIC ACID) 500 MG tablet Take 500 mg by mouth daily.  . vitamin E 100 UNIT capsule Take 500 Units by mouth  daily.   No facility-administered encounter medications on file as of 09/01/2018.     Allergies (verified) Statins   History: Past Medical History:  Diagnosis Date  . Allergy   . Anemia   . Arthritis   . CAD (coronary artery disease)   . Cancer (HCC)    BASAL CELL-FACE  . Complication of anesthesia    PT IS A RED HEAD AND STATES IT TAKES ALOT MORE ANESTHESIA TO SEDATE HIM  . Diabetes mellitus without complication ([place])   . Dysrhythmia    SVT  . Fatty liver   . GERD (gastroesophageal reflux disease)   . Hyperlipidemia   . Multiple thyroid nodules   . Rheumatic fever   . Sleep apnea 1997   Uses C-Pap machine  . SVT (supraventricular tachycardia) ([place])   . Thyroid nodule    Past Surgical History:  Procedure Laterality Date  . APPENDECTOMY  1965  . BICEPS TENDON REPAIR Right 05/2015  . COLONOSCOPY  2016  . LUMBAR FUSION  2015   L4/L5  . RHINOPLASTY  1997  . TONSILLECTOMY  1956  . VASECTOMY  1981   Family History  Problem Relation Age of Onset  . Arthritis Mother   . Lung cancer Mother   . Hyperlipidemia Mother   . Diabetes Mother   . Arthritis Father   . Diabetes Father   . Lung disease Father   .  Hyperlipidemia Maternal Grandmother   . Alcohol abuse Maternal Grandfather   . Hyperlipidemia Maternal Grandfather   . Drug abuse Cousin    Social History   Socioeconomic History  . Marital status: Married    Spouse name: Not on file  . Number of children: Not on file  . Years of education: Not on file  . Highest education level: Not on file  Occupational History  . Not on file  Social Needs  . Financial resource strain: Not hard at all  . Food insecurity:    Worry: Never true    Inability: Never true  . Transportation needs:    Medical: No    Non-medical: No  Tobacco Use  . Smoking status: Never Smoker  . Smokeless tobacco: Former Network engineer and Sexual Activity  . Alcohol use: Yes    Comment: OCC  . Drug use: No  . Sexual activity: Not  Currently  Lifestyle  . Physical activity:    Days per week: 3 days    Minutes per session: Not on file  . Stress: Not at all  Relationships  . Social connections:    Talks on phone: Not on file    Gets together: Not on file    Attends religious service: Not on file    Active member of club or organization: Not on file    Attends meetings of clubs or organizations: Not on file    Relationship status: Married  Other Topics Concern  . Not on file  Social History Narrative   Married    Former Nature conservation officer    Tobacco Counseling Counseling given: Not Answered   Clinical Intake:  Pre-visit preparation completed: Yes  Pain : No/denies pain     Nutritional Status: BMI > 30  Obese Diabetes: Yes  How often do you need to have someone help you when you read instructions, pamphlets, or other written materials from your doctor or pharmacy?: 1 - Never  Interpreter Needed?: No     Activities of Daily Living In your present state of health, do you have any difficulty performing the following activities: 09/01/2018  Hearing? N  Vision? N  Difficulty concentrating or making decisions? N  Walking or climbing stairs? N  Dressing or bathing? N  Doing errands, shopping? N  Preparing Food and eating ? N  Using the Toilet? N  In the past six months, have you accidently leaked urine? N  Do you have problems with loss of bowel control? N  Managing your Medications? N  Managing your Finances? N  Housekeeping or managing your Housekeeping? N  Some recent data might be hidden     Immunizations and Health Maintenance Immunization History  Administered Date(s) Administered  . Hep A / Hep B 03/03/2018, 03/31/2018, 09/01/2018  . Influenza, High Dose Seasonal PF 07/04/2018  . Influenza,inj,Quad PF,6+ Mos 07/20/2017  . Influenza-Unspecified 07/04/2016  . Pneumococcal Conjugate-13 01/20/2018  . Tdap 03/21/2018   Health Maintenance Due  Topic Date Due  . FOOT EXAM  05/26/2017     Patient Care Team: [name], [name], MD as PCP - General (Internal Medicine) Coral Spikes, DO as Consulting Physician (Family Medicine) Bary Castilla Forest Gleason, MD (General Surgery)  Indicate any recent Medical Services you may have received from other than [facility] providers in the past year (date may be approximate).    Assessment:   This is a routine wellness examination for Marjorie. The goal of the wellness visit is to assist the patient how to  close the gaps in care and create a preventative care plan for the patient.   The roster of all physicians providing medical care to patient is listed in the Snapshot section of the chart.  Osteoporosis risk reviewed.    Safety issues reviewed; Smoke and carbon monoxide detectors in the home. Firearm safety discussed. Wears seatbelts when driving or riding with others. No violence in the home.  They do not have excessive sun exposure.  Discussed the need for sun protection: hats, long sleeves and the use of sunscreen if there is significant sun exposure.  Patient is alert, normal appearance, oriented to person/place/and time. Correctly identified the president of the [place] and recalls of 2/3 words.Performs simple calculations and can read correct time from watch face.  Displays appropriate judgement.  No new identified risk were noted.  No failures at ADL's or IADL's.    BMI- discussed the importance of a healthy diet, water intake and the benefits of aerobic exercise. He tries to have a healthy diet.  Requests referral to diabetic nutritional; deferred to pcp for follow up. Plans to increase his activity, has adequate water intake.   Dental- every 6 months.  Eye- Visual acuity assessed. Wears corrective lenses.  Sleep patterns- Sleeps 8 hours at night.  Wakes feeling rested. CPAP in use.  Hep A/B 3 of 3 administered L deltid, tolerated well.  No verbal complaint during or after administration.  Educational material  provided.  Patient Concerns: Diabetic nutrition referral request.  Deferred to pcp for follow up.  Hearing/Vision screen  Visual Acuity Screening   Right eye Left eye Both eyes  Without correction:   20/25  With correction:   20/20  Comments: Follow[facility] by [facility] Wears corrective lenses   Hearing Screening Comments: Patient is able to hear conversational tones without difficulty.  No issues reported.   Dietary issues and exercise activities discussed: Current Exercise Habits: Home exercise routine, Type of exercise: walking;calisthenics, Intensity: Mild  Goals    . Increase physical activity     Weight 190lb [employer] senior program      Depression Screen PHQ 2/9 Scores 09/01/2018 03/03/2018 01/20/2018 05/26/2016  PHQ - 2 Score 0 0 0 0    Fall Risk Fall Risk  09/01/2018 03/03/2018 01/20/2018 05/26/2016  Falls in the past year? 0 No No Yes  Number falls in past yr: - - - 1  Injury with Fall? - - - Yes   Cognitive Function: MMSE - Mini Mental State Exam 09/01/2018  Orientation to time 5  Orientation to Place 5  Registration 3  Attention/ Calculation 5  Recall 3  Language- name 2 objects 2  Language- repeat 1  Language- follow 3 step command 3  Language- read & follow direction 1  Write a sentence 1  Copy design 1  Total score 30       Screening Tests Health Maintenance  Topic Date Due  . FOOT EXAM  05/26/2017  . OPHTHALMOLOGY EXAM  10/20/2018  . HEMOGLOBIN A1C  12/21/2018  . PNA vac Low Risk Adult (2 of 2 - PPSV23) 01/21/2019  . URINE MICROALBUMIN  06/21/2019  . COLONOSCOPY  10/26/2025  . TETANUS/TDAP  03/21/2028  . INFLUENZA VACCINE  Completed      Plan:    End of life planning; Advance aging; Advanced directives discussed. Copy of current HCPOA/Living Will requested upon completion.    I have personally reviewed and noted the following in the patient's chart:   . Medical and social  history . Use of alcohol, tobacco or illicit drugs  . Current  medications and supplements . Functional ability and status . Nutritional status . Physical activity . Advanced directives . List of other physicians . Hospitalizations, surgeries, and ER visits in previous 12 months . Vitals . Screenings to include cognitive, depression, and falls . Referrals and appointments  In addition, I have reviewed and discussed with patient certain preventive protocols, quality metrics, and best practice recommendations. A written personalized care plan for preventive services as well as general preventive health recommendations were provided to patient.     Varney Biles, LPN   16/10/958

## 2018-09-12 NOTE — Progress Notes (Signed)
Cardiology Office Note  Date:  09/13/2018   ID:  Jackson [name], DOB June 12, 1951, MRN [id number]  PCP:  [name], [name], MD   Chief Complaint  Patient presents with  . New Patient (Initial Visit)    Hyperlipidemia. Medications reviewed verbally.     HPI:  [name] is a 67 year old gentleman with past medical history of Diabetes Hyperlipidemia SVT Who presents by referral from Dr. Olivia Mackie [name] for management of hyperlipidemia, carotid risk factors  He reports having prior history of SVT  last in 2010, several episodes Several episodes while riding a bike Rate 195, would not break, he broke on its own after 1 or 2 hours started on metoprolol 100 Has not had further episodes since that time, recently weaned down to metoprolol 25 Denies any breakthrough   labs discussed with him  LDL 153 Total cholesterol 208 Triglycerides 258 Hemoglobin A1c 7.0  CT scan chest June 2019 no significant aortic atherosclerosis or coronary calcification Images pulled up in the office  lipitor for 8-9 years Joint and muscle weakness  EKG personally reviewed by myself on todays visit Shows normal sinus rhythm rate 61 bpm no significant ST or T wave changes   PMH:   has a past medical history of Allergy, Anemia, Arthritis, CAD (coronary artery disease), Cancer ([place]), Complication of anesthesia, Diabetes mellitus without complication ([place]), Dysrhythmia, Fatty liver, GERD (gastroesophageal reflux disease), Hyperlipidemia, Multiple thyroid nodules, Rheumatic fever, Sleep apnea (1997), SVT (supraventricular tachycardia) ([place]), and Thyroid nodule.  PSH:    Past Surgical History:  Procedure Laterality Date  . APPENDECTOMY  1965  . BICEPS TENDON REPAIR Right 05/2015  . COLONOSCOPY  2016  . LUMBAR FUSION  2015   L4/L5  . RHINOPLASTY  1997  . TONSILLECTOMY  1956  . VASECTOMY  1981    Current Outpatient Medications  Medication Sig Dispense Refill  . azelastine  (ASTELIN) 0.1 % nasal spray Place 1 spray into both nostrils 2 (two) times daily. Use in each nostril as directed 90 mL 3  . cetirizine (ZYRTEC) 10 MG tablet Take 10 mg by mouth at bedtime.     . Cinnamon 500 MG capsule Take 500 mg by mouth daily.    . Coenzyme Q10 (COQ10) 200 MG CAPS Take 200 mg by mouth.    . metFORMIN (GLUCOPHAGE) 1000 MG tablet Take 1 tablet (1,000 mg total) by mouth 2 (two) times daily with a meal. . 180 tablet 3  . metoprolol succinate (TOPROL-XL) 25 MG 24 hr tablet Take 1 tablet (25 mg total) by mouth daily. Take with or immediately following a meal. 90 tablet 3  . Multiple Vitamins-Minerals (MULTIVITAMIN ADULT PO) Take by mouth.    . Omega-3 Fatty Acids (FISH OIL) 1200 MG CPDR Take 1 tablet by mouth daily.    Marland Kitchen omeprazole (PRILOSEC) 40 MG capsule Take 1 capsule (40 mg total) by mouth daily. 90 capsule 3  . OVER THE COUNTER MEDICATION Osteo biflex    . sitaGLIPtin (JANUVIA) 50 MG tablet Take 1 tablet (50 mg total) by mouth daily. 90 tablet 3  . vitamin C (ASCORBIC ACID) 500 MG tablet Take 500 mg by mouth daily.    . vitamin E 100 UNIT capsule Take 500 Units by mouth daily.     No current facility-administered medications for this visit.      Allergies:   Statins   Social History:  The patient  reports that he has never smoked. He has quit using smokeless tobacco. He reports  that he drinks alcohol. He reports that he does not use drugs.   Family History:   family history includes Alcohol abuse in his maternal grandfather; Arthritis in his father and mother; Diabetes in his father and mother; Drug abuse in his cousin; Hyperlipidemia in his maternal grandfather, maternal grandmother, and mother; Lung cancer in his mother; Lung disease in his father.    Review of Systems: Review of Systems  Constitutional: Negative.   Respiratory: Negative.   Cardiovascular: Negative.   Gastrointestinal: Negative.   Musculoskeletal: Negative.   Neurological: Negative.    Psychiatric/Behavioral: Negative.   All other systems reviewed and are negative.    PHYSICAL EXAM: VS:  BP 126/64 (BP Location: Right Arm, Patient Position: Sitting, Cuff Size: Normal)   Pulse 61   Ht 6\' 1"  (1.854 m)   Wt 225 lb (102.1 kg)   BMI 29.69 kg/m  , BMI Body mass index is 29.69 kg/m. GEN: Well nourished, well developed, in no acute distress  HEENT: normal  Neck: no JVD, carotid bruits, or masses Cardiac: RRR; no murmurs, rubs, or gallops,no edema  Respiratory:  clear to auscultation bilaterally, normal work of breathing GI: soft, nontender, nondistended, + BS MS: no deformity or atrophy  Skin: warm and dry, no rash Neuro:  Strength and sensation are intact Psych: euthymic mood, full affect   Recent Labs: 01/20/2018: TSH 1.13 06/20/2018: ALT 24; BUN 16; Creatinine, Ser 1.23; Hemoglobin 13.3; Platelets 236.0; Potassium 4.6; Sodium 138    Lipid Panel Lab Results  Component Value Date   CHOL 208 (H) 06/20/2018   HDL 29.20 (L) 06/20/2018   LDLCALC 148 (H) 11/26/2016   TRIG 258.0 (H) 06/20/2018      Wt Readings from Last 3 Encounters:  09/13/18 225 lb (102.1 kg)  09/01/18 227 lb 1.9 oz (103 kg)  07/04/18 234 lb (106.1 kg)       ASSESSMENT AND PLAN:  Paroxysmal SVT (supraventricular tachycardia) ([place]) - Plan: EKG 12-Lead No recent episodes of SVT, tolerating metoprolol 25 Discussed various maneuvers such as carotid sinus massage, Valsalva Discussed ablation for recurrent episodes Discussed long term monitors if he has recurrent arrhythmia Discussed calling EMTs and adenosine  Type 2 diabetes mellitus without complication, without long-term current use of insulin ([place]) - Plan: EKG 12-Lead We have encouraged continued exercise, careful diet management in an effort to lose weight. We did discuss that this is a risk factor for cardiovascular disease  Mixed hyperlipidemia Numbers are average,  He prefers not to be on medications, previous Lipitor 89 years  but had myalgias Was worried about muscle weakness Options for lipid management include Zetia, perhaps Crestor 5 mg 2 or 3 days/week He will think about this option, again no significant coronary calcification or aortic atherosclerosis noted  Disposition:   F/U as needed   Total encounter time more than 60 minutes  Greater than 50% was spent in counseling and coordination of care with the patient    Orders Placed This Encounter  Procedures  . EKG 12-Lead     Signed, Esmond Plants, M.D., Ph.D. 09/13/2018  [place], [place]

## 2018-09-13 ENCOUNTER — Encounter: Payer: Self-pay | Admitting: Cardiovascular Disease

## 2018-09-13 ENCOUNTER — Ambulatory Visit (INDEPENDENT_AMBULATORY_CARE_PROVIDER_SITE_OTHER): Payer: Medicare Other | Admitting: Cardiovascular Disease

## 2018-09-13 VITALS — BP 126/64 | HR 61 | Ht 73.0 in | Wt 225.0 lb

## 2018-09-13 DIAGNOSIS — E782 Mixed hyperlipidemia: Secondary | ICD-10-CM | POA: Diagnosis not present

## 2018-09-13 DIAGNOSIS — I471 Supraventricular tachycardia: Secondary | ICD-10-CM

## 2018-09-13 DIAGNOSIS — E119 Type 2 diabetes mellitus without complications: Secondary | ICD-10-CM | POA: Diagnosis not present

## 2018-09-13 NOTE — Patient Instructions (Signed)
Medication Instructions:   Consider zetia 10 mg once a day for cholesterol You could add crestor 5 mg twice a week  If you need a refill on your cardiac medications before your next appointment, please call your pharmacy.    Lab work: No new labs needed   If you have labs (blood work) drawn today and your tests are completely normal, you will receive your results only by: Marland Kitchen MyChart Message (if you have MyChart) OR . A paper copy in the mail If you have any lab test that is abnormal or we need to change your treatment, we will call you to review the results.   Testing/Procedures: No new testing needed   Follow-Up: At [facility], you and your health needs are our priority.  As part of our continuing mission to provide you with exceptional heart care, we have created designated Provider Care Teams.  These Care Teams include your primary Cardiologist (physician) and Advanced Practice Providers (APPs -  Physician Assistants and Nurse Practitioners) who all work together to provide you with the care you need, when you need it.  . You will need a follow up appointment as needed  . Providers on your designated Care Team:   . Murray Hodgkins, NP . Christell Faith, PA-C . Marrianne Mood, PA-C  Any Other Special Instructions Will Be Listed Below (If Applicable).  For educational health videos Log in to : [email or web address] Or : [email or web address], password : [email or web address]

## 2018-09-14 ENCOUNTER — Ambulatory Visit: Payer: Medicare Other | Attending: Neurology

## 2018-09-14 DIAGNOSIS — G4733 Obstructive sleep apnea (adult) (pediatric): Secondary | ICD-10-CM | POA: Insufficient documentation

## 2018-09-14 DIAGNOSIS — F5101 Primary insomnia: Secondary | ICD-10-CM | POA: Insufficient documentation

## 2018-09-20 ENCOUNTER — Telehealth: Payer: Self-pay | Admitting: Internal Medicine

## 2018-09-20 NOTE — Telephone Encounter (Signed)
Error   TMS 

## 2018-09-20 NOTE — Telephone Encounter (Signed)
Please order  CPAP 8 cm H20 humidified, [place] Full face mask large  Sleep study 09/14/18 mild sleep apnea  Inform pt

## 2018-09-21 NOTE — Telephone Encounter (Signed)
Patient was informed and information of results via mychart.

## 2018-10-03 ENCOUNTER — Encounter: Payer: Medicare Other | Attending: Internal Medicine | Admitting: *Deleted

## 2018-10-03 ENCOUNTER — Encounter: Payer: Self-pay | Admitting: *Deleted

## 2018-10-03 VITALS — BP 122/74 | Ht 73.0 in | Wt 228.7 lb

## 2018-10-03 DIAGNOSIS — E119 Type 2 diabetes mellitus without complications: Secondary | ICD-10-CM

## 2018-10-03 NOTE — Progress Notes (Signed)
Diabetes Self-Management Education  Visit Type: First/Initial  Appt. Start Time: 1330 Appt. End Time: 1430  10/03/2018  [name], identified by name and date of birth, is a 67 y.o. male with a diagnosis of Diabetes: Type 2.   ASSESSMENT  Blood pressure 122/74, height 6\' 1"  (1.854 m), weight 228 lb 11.2 oz (103.7 kg). Body mass index is 30.17 kg/m.  Diabetes Self-Management Education - 10/03/18 1458      Visit Information   Visit Type  First/Initial      Initial Visit   Diabetes Type  Type 2    Are you currently following a meal plan?  Yes    What type of meal plan do you follow?  "cut out sugar"    Are you taking your medications as prescribed?  Yes    Date Diagnosed  5 years ago      Health Coping   How would you rate your overall health?  Good      Psychosocial Assessment   Patient Belief/Attitude about Diabetes  Motivated to manage diabetes   "not much impact physically"   Self-care barriers  None    Self-management support  Doctor's office;Family    Other persons present  Spouse/SO    Patient Concerns  Nutrition/Meal planning;Glycemic Control;Medication;Monitoring;Weight Control    Special Needs  None    Preferred Learning Style  Visual    Learning Readiness  Change in progress    How often do you need to have someone help you when you read instructions, pamphlets, or other written materials from your doctor or pharmacy?  1 - Never    What is the last grade level you completed in school?  MBA      Pre-Education Assessment   Patient understands the diabetes disease and treatment process.  Needs Review    Patient understands incorporating nutritional management into lifestyle.  Needs Review    Patient undertands incorporating physical activity into lifestyle.  Needs Instruction    Patient understands using medications safely.  Needs Review    Patient understands monitoring blood glucose, interpreting and using results  Needs Review    Patient understands  prevention, detection, and treatment of acute complications.  Needs Instruction    Patient understands prevention, detection, and treatment of chronic complications.  Needs Review    Patient understands how to develop strategies to address psychosocial issues.  Needs Instruction    Patient understands how to develop strategies to promote health/change behavior.  Needs Instruction      Complications   Last HgB A1C per patient/outside source  7 %   06/20/18   Fasting Blood glucose range (mg/dL)  70-129   Pt reports most FBG's 95-115 mg/dL.    Postprandial Blood glucose range (mg/dL)  130-179   Pt reports pp's usually 140's mg/dL with occasional reading as high as 190 mg/dL due to food choices.    Have you had a dilated eye exam in the past 12 months?  Yes    Have you had a dental exam in the past 12 months?  Yes    Are you checking your feet?  Yes    How many days per week are you checking your feet?  7      Dietary Intake   Breakfast  fiber cereal and fruit    Lunch  hog dogs, fast food, chicken sandwich or chicken tenders, [employer], Chipotle    Snack (afternoon)  occasional snack of sausage or cheese stick, chips  Dinner  beef, chicken, fish, pork chops with 1 starch (potatoes or rice or peas or beans) occasional pasta with green beans, brussel sprouts, asparagus, broccoli    Beverage(s)  water, coffee sweeetned with Agave       Exercise   Exercise Type  ADL's      Patient Education   Previous Diabetes Education  No    Disease state   Definition of diabetes, type 1 and 2, and the diagnosis of diabetes;Factors that contribute to the development of diabetes    Nutrition management   Role of diet in the treatment of diabetes and the relationship between the three main macronutrients and blood glucose level;Reviewed blood glucose goals for pre and post meals and how to evaluate the patients' food intake on their blood glucose level.    Physical activity and exercise   Role of exercise on  diabetes management, blood pressure control and cardiac health.    Medications  Reviewed patients medication for diabetes, action, purpose, timing of dose and side effects.    Monitoring  Purpose and frequency of SMBG.;Taught/discussed recording of test results and interpretation of SMBG.;Identified appropriate SMBG and/or A1C goals.    Chronic complications  Relationship between chronic complications and blood glucose control    Psychosocial adjustment  Identified and addressed patients feelings and concerns about diabetes      Individualized Goals (developed by patient)   Reducing Risk  Improve blood sugars Decrease medications Prevent diabetes complications Lose weight Become more fit     Outcomes   Expected Outcomes  Demonstrated interest in learning. Expect positive outcomes    Future DMSE  4-6 wks       Individualized Plan for Diabetes Self-Management Training:   Learning Objective:  Patient will have a greater understanding of diabetes self-management. Patient education plan is to attend individual and/or group sessions per assessed needs and concerns.   Plan:   Patient Instructions  Check blood sugars 1-2 x day before breakfast and 2 hrs after one meal  every day Bring blood sugar records to the next class Exercise: Begin walking/biking  for  15  minutes  3  days a week and gradually increase to 150 minutes/week Eat 3 meals day, 1-2 snacks a day Space meals 4-6 hours apart   Expected Outcomes:  Demonstrated interest in learning. Expect positive outcomes  Education material provided: General Meal Planning Guidelines Simple Meal Plan  If problems or questions, patient to contact team via:  Johny Drilling, [place], [place], CDE [phone]  Future DSME appointment: 4-6 wks  November 10, 2017 for Diabetes Class 1

## 2018-10-03 NOTE — Patient Instructions (Signed)
Check blood sugars 1-2 x day before breakfast and 2 hrs after one meal  every day Bring blood sugar records to the next class  Exercise: Begin walking/biking  for  15  minutes  3  days a week and gradually increase to 150 minutes/week  Eat 3 meals day, 1-2 snacks a day Space meals 4-6 hours apart  Return for classes on:

## 2018-11-03 ENCOUNTER — Ambulatory Visit: Payer: Medicare Other | Admitting: Internal Medicine

## 2018-11-03 DIAGNOSIS — L570 Actinic keratosis: Secondary | ICD-10-CM | POA: Diagnosis not present

## 2018-11-03 DIAGNOSIS — L821 Other seborrheic keratosis: Secondary | ICD-10-CM | POA: Diagnosis not present

## 2018-11-03 DIAGNOSIS — L219 Seborrheic dermatitis, unspecified: Secondary | ICD-10-CM | POA: Diagnosis not present

## 2018-11-03 DIAGNOSIS — L814 Other melanin hyperpigmentation: Secondary | ICD-10-CM | POA: Diagnosis not present

## 2018-11-08 DIAGNOSIS — H6123 Impacted cerumen, bilateral: Secondary | ICD-10-CM | POA: Diagnosis not present

## 2018-11-10 ENCOUNTER — Encounter: Payer: Medicare Other | Attending: Internal Medicine | Admitting: Dietician

## 2018-11-10 ENCOUNTER — Encounter: Payer: Self-pay | Admitting: Dietician

## 2018-11-10 VITALS — Wt 226.4 lb

## 2018-11-10 DIAGNOSIS — E119 Type 2 diabetes mellitus without complications: Secondary | ICD-10-CM

## 2018-11-10 NOTE — Progress Notes (Signed)

## 2018-11-17 ENCOUNTER — Encounter: Payer: Medicare Other | Admitting: *Deleted

## 2018-11-17 ENCOUNTER — Encounter: Payer: Self-pay | Admitting: *Deleted

## 2018-11-17 VITALS — Wt 227.2 lb

## 2018-11-17 DIAGNOSIS — E119 Type 2 diabetes mellitus without complications: Secondary | ICD-10-CM

## 2018-11-17 NOTE — Progress Notes (Signed)

## 2018-11-21 DIAGNOSIS — L570 Actinic keratosis: Secondary | ICD-10-CM | POA: Diagnosis not present

## 2018-11-22 ENCOUNTER — Ambulatory Visit (INDEPENDENT_AMBULATORY_CARE_PROVIDER_SITE_OTHER): Payer: Medicare Other | Admitting: Internal Medicine

## 2018-11-22 VITALS — BP 112/72 | HR 57 | Temp 98.2°F | Ht 73.0 in | Wt 225.8 lb

## 2018-11-22 DIAGNOSIS — E041 Nontoxic single thyroid nodule: Secondary | ICD-10-CM

## 2018-11-22 DIAGNOSIS — E1142 Type 2 diabetes mellitus with diabetic polyneuropathy: Secondary | ICD-10-CM | POA: Diagnosis not present

## 2018-11-22 DIAGNOSIS — E782 Mixed hyperlipidemia: Secondary | ICD-10-CM | POA: Diagnosis not present

## 2018-11-22 DIAGNOSIS — L570 Actinic keratosis: Secondary | ICD-10-CM

## 2018-11-22 DIAGNOSIS — E611 Iron deficiency: Secondary | ICD-10-CM

## 2018-11-22 DIAGNOSIS — Z1159 Encounter for screening for other viral diseases: Secondary | ICD-10-CM | POA: Diagnosis not present

## 2018-11-22 DIAGNOSIS — R42 Dizziness and giddiness: Secondary | ICD-10-CM | POA: Diagnosis not present

## 2018-11-22 DIAGNOSIS — H6690 Otitis media, unspecified, unspecified ear: Secondary | ICD-10-CM | POA: Diagnosis not present

## 2018-11-22 DIAGNOSIS — K760 Fatty (change of) liver, not elsewhere classified: Secondary | ICD-10-CM

## 2018-11-22 DIAGNOSIS — E119 Type 2 diabetes mellitus without complications: Secondary | ICD-10-CM | POA: Diagnosis not present

## 2018-11-22 MED ORDER — CIPROFLOXACIN-DEXAMETHASONE 0.3-0.1 % OT SUSP: 4.0000 [drp] | Freq: Two times a day (BID) | OTIC | 0 refills | Status: DC

## 2018-11-22 MED ORDER — SITAGLIPTIN PHOSPHATE 50 MG PO TABS: 50.0000 mg | ORAL_TABLET | Freq: Every day | ORAL | 3 refills | Status: DC

## 2018-11-22 NOTE — Patient Instructions (Addendum)
Try Meclizine if needed call back or mychart in 1-2 weeks   Dizziness Dizziness is a common problem. It is a feeling of unsteadiness or light-headedness. You may feel like you are about to faint. Dizziness can lead to injury if you stumble or fall. Anyone can become dizzy, but dizziness is more common in older adults. This condition can be caused by a number of things, including medicines, dehydration, or illness. Follow these instructions at home: Eating and drinking  Drink enough fluid to keep your urine clear or pale yellow. This helps to keep you from becoming dehydrated. Try to drink more clear fluids, such as water.  Do not drink alcohol.  Limit your caffeine intake if told to do so by your health care provider. Check ingredients and nutrition facts to see if a food or beverage contains caffeine.  Limit your salt (sodium) intake if told to do so by your health care provider. Check ingredients and nutrition facts to see if a food or beverage contains sodium. Activity  Avoid making quick movements. ? Rise slowly from chairs and steady yourself until you feel okay. ? In the morning, first sit up on the side of the bed. When you feel okay, stand slowly while you hold onto something until you know that your balance is fine.  If you need to stand in one place for a long time, move your legs often. Tighten and relax the muscles in your legs while you are standing.  Do not drive or use heavy machinery if you feel dizzy.  Avoid bending down if you feel dizzy. Place items in your home so that they are easy for you to reach without leaning over. Lifestyle  Do not use any products that contain nicotine or tobacco, such as cigarettes and e-cigarettes. If you need help quitting, ask your health care provider.  Try to reduce your stress level by using methods such as yoga or meditation. Talk with your health care provider if you need help to manage your stress. General instructions  Watch your  dizziness for any changes.  Take over-the-counter and prescription medicines only as told by your health care provider. Talk with your health care provider if you think that your dizziness is caused by a medicine that you are taking.  Tell a friend or a family member that you are feeling dizzy. If he or she notices any changes in your behavior, have this person call your health care provider.  Keep all follow-up visits as told by your health care provider. This is important. Contact a health care provider if:  Your dizziness does not go away.  Your dizziness or light-headedness gets worse.  You feel nauseous.  You have reduced hearing.  You have new symptoms.  You are unsteady on your feet or you feel like the room is spinning. Get help right away if:  You vomit or have diarrhea and are unable to eat or drink anything.  You have problems talking, walking, swallowing, or using your arms, hands, or legs.  You feel generally weak.  You are not thinking clearly or you have trouble forming sentences. It may take a friend or family member to notice this.  You have chest pain, abdominal pain, shortness of breath, or sweating.  Your vision changes.  You have any bleeding.  You have a severe headache.  You have neck pain or a stiff neck.  You have a fever. These symptoms may represent a serious problem that is an emergency. Do  not wait to see if the symptoms will go away. Get medical help right away. Call your local emergency services (911 in the [place] Do not drive yourself to the hospital. Summary  Dizziness is a feeling of unsteadiness or light-headedness. This condition can be caused by a number of things, including medicines, dehydration, or illness.  Anyone can become dizzy, but dizziness is more common in older adults.  Drink enough fluid to keep your urine clear or pale yellow. Do not drink alcohol.  Avoid making quick movements if you feel dizzy. Monitor your  dizziness for any changes. This information is not intended to replace advice given to you by your health care provider. Make sure you discuss any questions you have with your health care provider. Document Released: 04/07/2001 Document Revised: 11/14/2016 Document Reviewed: 11/14/2016 Elsevier Interactive Patient Education  2019 [employer].  Vertigo Vertigo is the feeling that you or your surroundings are moving when they are not. Vertigo can be dangerous if it occurs while you are doing something that could endanger you or others, such as driving. What are the causes? This condition is caused by a disturbance in the signals that are sent by your body's sensory systems to your brain. Different causes of a disturbance can lead to vertigo, including:  Infections, especially in the inner ear.  A bad reaction to a drug, or misuse of alcohol and medicines.  Withdrawal from drugs or alcohol.  Quickly changing positions, as when lying down or rolling over in bed.  Migraine headaches.  Decreased blood flow to the brain.  Decreased blood pressure.  Increased pressure in the brain from a head or neck injury, stroke, infection, tumor, or bleeding.  Central nervous system disorders. What are the signs or symptoms? Symptoms of this condition usually occur when you move your head or your eyes in different directions. Symptoms may start suddenly, and they usually last for less than a minute. Symptoms may include:  Loss of balance and falling.  Feeling like you are spinning or moving.  Feeling like your surroundings are spinning or moving.  Nausea and vomiting.  Blurred vision or double vision.  Difficulty hearing.  Slurred speech.  Dizziness.  Involuntary eye movement (nystagmus). Symptoms can be mild and cause only slight annoyance, or they can be severe and interfere with daily life. Episodes of vertigo may return (recur) over time, and they are often triggered by certain  movements. Symptoms may improve over time. How is this diagnosed? This condition may be diagnosed based on medical history and the quality of your nystagmus. Your health care provider may test your eye movements by asking you to quickly change positions to trigger the nystagmus. This may be called the Dix-Hallpike test, head thrust test, or roll test. You may be referred to a health care provider who specializes in ear, nose, and throat (ENT) problems (otolaryngologist) or a provider who specializes in disorders of the central nervous system (neurologist). You may have additional testing, including:  A physical exam.  Blood tests.  MRI.  A CT scan.  An electrocardiogram (ECG). This records electrical activity in your heart.  An electroencephalogram (EEG). This records electrical activity in your brain.  Hearing tests. How is this treated? Treatment for this condition depends on the cause and the severity of the symptoms. Treatment options include:  Medicines to treat nausea or vertigo. These are usually used for severe cases. Some medicines that are used to treat other conditions may also reduce or eliminate vertigo symptoms.  These include: ? Medicines that control allergies (antihistamines). ? Medicines that control seizures (anticonvulsants). ? Medicines that relieve depression (antidepressants). ? Medicines that relieve anxiety (sedatives).  Head movements to adjust your inner ear back to normal. If your vertigo is caused by an ear problem, your health care provider may recommend certain movements to correct the problem.  Surgery. This is rare. Follow these instructions at home: Safety  Move slowly.Avoid sudden body or head movements.  Avoid driving.  Avoid operating heavy machinery.  Avoid doing any tasks that would cause danger to you or others if you would have a vertigo episode during the task.  If you have trouble walking or keeping your balance, try using a cane for  stability. If you feel dizzy or unstable, sit down right away.  Return to your normal activities as told by your health care provider. Ask your health care provider what activities are safe for you. General instructions  Take over-the-counter and prescription medicines only as told by your health care provider.  Avoid certain positions or movements as told by your health care provider.  Drink enough fluid to keep your urine clear or pale yellow.  Keep all follow-up visits as told by your health care provider. This is important. Contact a health care provider if:  Your medicines do not relieve your vertigo or they make it worse.  You have a fever.  Your condition gets worse or you develop new symptoms.  Your family or friends notice any behavioral changes.  Your nausea or vomiting gets worse.  You have numbness or a "pins and needles" sensation in part of your body. Get help right away if:  You have difficulty moving or speaking.  You are always dizzy.  You faint.  You develop severe headaches.  You have weakness in your hands, arms, or legs.  You have changes in your hearing or vision.  You develop a stiff neck.  You develop sensitivity to light. This information is not intended to replace advice given to you by your health care provider. Make sure you discuss any questions you have with your health care provider. Document Released: 07/22/2005 Document Revised: 03/25/2016 Document Reviewed: 02/04/2015 Elsevier Interactive Patient Education  2019 [employer].

## 2018-11-22 NOTE — Progress Notes (Signed)
Chief Complaint  Patient presents with  . Follow-up   F/u  1. DM 2 on januvia 50 mg qd doing well and metformin  2. 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 Laurence Ferrari did UV tx to arms h/o BCC to forehead  3. C/o right ear pain and he has been flushing it out with otc flushes and ear fullness with feeling vertigo and off balance 1 week ago while at the dentist and getting up off the chair. No nausea. He has meclizine at home which he has not tried but will try due the room spinning.  4. H/o rhinoplasty in 1997 and has trouble breathing out of right nose at times.  5. HLD zetia has not helped in the past and intolerable to statins.    Review of Systems  Constitutional: Negative for weight loss.  HENT: Positive for ear pain. Negative for hearing loss and tinnitus.   Eyes: Negative for blurred vision.  Respiratory: Negative for shortness of breath.   Cardiovascular: Negative for chest pain and palpitations.  Gastrointestinal: Negative for abdominal pain.  Musculoskeletal: Negative for falls.  Skin: Negative for rash.  Neurological:       +vertigo    Psychiatric/Behavioral: Negative for depression.   Past Medical History:  Diagnosis Date  . Allergy   . Anemia   . Arthritis   . CAD (coronary artery disease)   . Cancer (HCC)    BASAL CELL-FACE  . Complication of anesthesia    PT IS A RED HEAD AND STATES IT TAKES ALOT MORE ANESTHESIA TO SEDATE HIM  . Diabetes mellitus without complication ([place])   . Dysrhythmia    SVT  . Fatty liver   . GERD (gastroesophageal reflux disease)   . Hyperlipidemia   . Multiple thyroid nodules   . Rheumatic fever   . Sleep apnea 1997   Uses C-Pap machine  . SVT (supraventricular tachycardia) ([place])   . Thyroid nodule    Past Surgical History:  Procedure Laterality Date  . APPENDECTOMY  1965  . BICEPS TENDON REPAIR Right 05/2015  . COLONOSCOPY  2016  . LUMBAR FUSION  2015   L4/L5  . RHINOPLASTY  1997  . TONSILLECTOMY  1956  . VASECTOMY  1981    Family History  Problem Relation Age of Onset  . Arthritis Mother   . Lung cancer Mother   . Hyperlipidemia Mother   . Diabetes Mother   . Arthritis Father   . Diabetes Father   . Lung disease Father   . Hyperlipidemia Maternal Grandmother   . Alcohol abuse Maternal Grandfather   . Hyperlipidemia Maternal Grandfather   . Drug abuse Cousin    Social History   Socioeconomic History  . Marital status: Married    Spouse name: Not on file  . Number of children: Not on file  . Years of education: Not on file  . Highest education level: Not on file  Occupational History  . Not on file  Social Needs  . Financial resource strain: Not hard at all  . Food insecurity:    Worry: Never true    Inability: Never true  . Transportation needs:    Medical: No    Non-medical: No  Tobacco Use  . Smoking status: Never Smoker  . Smokeless tobacco: Former Systems developer    Types: Snuff  Substance and Sexual Activity  . Alcohol use: Yes    Alcohol/week: 1.0 - 2.0 standard drinks    Types: 1 - 2 Cans of beer  per week    Comment: OCC  . Drug use: No  . Sexual activity: Not Currently  Lifestyle  . Physical activity:    Days per week: 3 days    Minutes per session: Not on file  . Stress: Not at all  Relationships  . Social connections:    Talks on phone: Not on file    Gets together: Not on file    Attends religious service: Not on file    Active member of club or organization: Not on file    Attends meetings of clubs or organizations: Not on file    Relationship status: Married  . Intimate partner violence:    Fear of current or ex partner: No    Emotionally abused: No    Physically abused: No    Forced sexual activity: No  Other Topics Concern  . Not on file  Social History Narrative   Married    Former Nature conservation officer    Current Meds  Medication Sig  . azelastine (ASTELIN) 0.1 % nasal spray Place 1 spray into both nostrils 2 (two) times daily. Use in each nostril as directed  .  cetirizine (ZYRTEC) 10 MG tablet Take 10 mg by mouth at bedtime.   . Cinnamon 500 MG capsule Take 500 mg by mouth daily.  . Coenzyme Q10 (COQ10) 200 MG CAPS Take 200 mg by mouth daily.   Marland Kitchen ketoconazole (NIZORAL) 2 % shampoo   . metFORMIN (GLUCOPHAGE) 1000 MG tablet Take 1 tablet (1,000 mg total) by mouth 2 (two) times daily with a meal. . (Patient taking differently: Take 1,000 mg by mouth at bedtime. Marland Kitchen)  . metoprolol succinate (TOPROL-XL) 25 MG 24 hr tablet Take 1 tablet (25 mg total) by mouth daily. Take with or immediately following a meal.  . Multiple Vitamins-Minerals (MULTIVITAMIN ADULT PO) Take 1 tablet by mouth daily.   . Omega-3 Fatty Acids (FISH OIL) 1200 MG CPDR Take 1 tablet by mouth daily.  Marland Kitchen omeprazole (PRILOSEC) 40 MG capsule Take 1 capsule (40 mg total) by mouth daily.  Marland Kitchen OVER THE COUNTER MEDICATION Take 1 tablet by mouth daily. Osteo biflex   . sitaGLIPtin (JANUVIA) 50 MG tablet Take 1 tablet (50 mg total) by mouth daily.  . vitamin C (ASCORBIC ACID) 500 MG tablet Take 1,000 mg by mouth daily.   . vitamin E 100 UNIT capsule Take 400 Units by mouth daily.    Allergies  Allergen Reactions  . Statins Other (See Comments)    Myalgias   No results found for this or any previous visit (from the past 2160 hour(s)). Objective  Body mass index is 29.79 kg/m. Wt Readings from Last 3 Encounters:  11/22/18 225 lb 12.8 oz (102.4 kg)  11/17/18 227 lb 3.2 oz (103.1 kg)  11/10/18 226 lb 6.4 oz (102.7 kg)   Temp Readings from Last 3 Encounters:  11/22/18 98.2 F (36.8 C) (Oral)  09/01/18 98.6 F (37 C) (Oral)  07/04/18 98.5 F (36.9 C) (Oral)   BP Readings from Last 3 Encounters:  11/22/18 112/72  10/03/18 122/74  09/13/18 126/64   Pulse Readings from Last 3 Encounters:  11/22/18 (!) 57  09/13/18 61  09/01/18 69    Physical Exam Vitals signs and nursing note reviewed.  Constitutional:      Appearance: Normal appearance. He is well-developed, well-groomed and  overweight.  HENT:     Head: Normocephalic and atraumatic.     Nose: Nose normal.     Mouth/Throat:  Mouth: Mucous membranes are moist.     Pharynx: Oropharynx is clear.  Eyes:     Conjunctiva/sclera: Conjunctivae normal.     Pupils: Pupils are equal, round, and reactive to light.  Cardiovascular:     Rate and Rhythm: Normal rate and regular rhythm.     Heart sounds: Normal heart sounds.  Pulmonary:     Effort: Pulmonary effort is normal.     Breath sounds: Normal breath sounds.  Skin:    General: Skin is warm and dry.  Neurological:     General: No focal deficit present.     Mental Status: He is alert and oriented to person, place, and time. Mental status is at baseline.     Gait: Gait normal.  Psychiatric:        Attention and Perception: Attention and perception normal.        Mood and Affect: Mood and affect normal.        Speech: Speech normal.        Behavior: Behavior normal. Behavior is cooperative.        Thought Content: Thought content normal.        Cognition and Memory: Cognition and memory normal.        Judgment: Judgment normal.     Assessment   1. DM 2 A1C 7.0 urine protein had 06/20/18 2. AK changes to b/l forearms  3. Vertigo with right ear sxs and redness ? Om vs labrinthitis  4. Rhinoplasty unable to smell right nose  5. HLD  6. HM Plan   1. Cont meds  Monofilament test b/l feet today normal Eye exam due if not had  2. F/u derm Dr. Laurence Ferrari  3. ciprodex b/l ears  Prn meclizine  If not better rec ENT vs MRI  4.  Consider ENT in future  5. Fasting labs upcoming  6.  Flu shot utd  Tdap utd Had prevnar will need pna 23 in 1 year3/2020 do at f/u  Consider shingrix in futuregiven Rx today twinrix 3/3 MMR immune PSA had 06/20/18 normal 2.92 PSA 3.75 12/30/15, 3.60 09/23/15 Zoster had 03/13/15   hep C neg PSA 2.92 06/20/18 f/u urology Dr. Bernardo Heater PSA stable  Derm Dr. Laurence Ferrari LN2 arms, scalp f/u in 6 months from 05/2018  Colonoscopy 10/17/15 normal  fair prep CT chest due 04/07/19  Sleep study 09/14/18 sleep med mild OSA total AHI 10 per hr, cpap titration 9 cm h20 but turned down to 8  -rec wt loss , nasal congestion, CPAP at 8 cm h20 he also had high periodic limb movement index with only few arousals ?RLS +/- insomnia   -CPAP 8 cm H20 humidified, [place] Full face mask large measures to maintain airway address nasal congestion     Provider: Dr. Olivia Mackie [name]

## 2018-11-22 NOTE — Progress Notes (Signed)
Pre visit review using our clinic review tool, if applicable. No additional management support is needed unless otherwise documented below in the visit note. 

## 2018-11-23 ENCOUNTER — Encounter: Payer: Self-pay | Admitting: Internal Medicine

## 2018-11-23 DIAGNOSIS — L570 Actinic keratosis: Secondary | ICD-10-CM | POA: Insufficient documentation

## 2018-11-23 DIAGNOSIS — R42 Dizziness and giddiness: Secondary | ICD-10-CM | POA: Insufficient documentation

## 2018-11-24 ENCOUNTER — Encounter: Payer: Self-pay | Admitting: Dietician

## 2018-11-24 ENCOUNTER — Encounter: Payer: Medicare Other | Admitting: Dietician

## 2018-11-24 VITALS — BP 118/72 | Wt 227.0 lb

## 2018-11-24 DIAGNOSIS — E119 Type 2 diabetes mellitus without complications: Secondary | ICD-10-CM | POA: Diagnosis not present

## 2018-11-24 NOTE — Progress Notes (Signed)
Appt. Start Time: 9:00am Appt. End Time: 12:00  Class 3 Diabetes Overview - identify functions of pancreas and insulin; define insulin deficiency vs insulin resistance  Medications - state name, dose, timing of currently prescribed medications; describe types of medications available for diabetes  Psychosocial - identify DM as a source of stress; state the effects of stress on BG control; verbalize appropriate stress management techniques; identify personal stress issues   Nutritional Management - use food labels to identify serving size, content of carbohydrate, fiber, protein, fat, saturated fat and sodium; recognize food sources of fat, saturated fat, trans fat, and sodium, and verbalize goals for intake; describe healthful, appropriate food choices when dining out   Exercise - state a plan for personal exercise; verbalize contraindications for exercise  Self-Monitoring - state importance of SMBG; use SMBG results to effectively manage diabetes; identify importance of regular HbA1C testing and goals for results  Acute Complications - recognize hyperglycemia and hypoglycemia with causes and effects; identify blood glucose results as high, low or in control; list steps in treating and preventing high and low blood glucose  Chronic Complications - state importance of daily self-foot exams; explain appropriate eye and dental care  Lifestyle Changes/Goals & Health/Community Resources - set goals for proper diabetes care; state need for and frequency of healthcare follow-up; describe appropriate community resources for good health (ADA, web sites, apps)   Teaching Materials Used: Class 3 Slide Packet Diabetes Stress Test Stress Management Tools Stress Poem Goal Setting Worksheet Website/App List    

## 2018-11-29 ENCOUNTER — Encounter: Payer: Self-pay | Admitting: *Deleted

## 2018-12-21 ENCOUNTER — Other Ambulatory Visit (INDEPENDENT_AMBULATORY_CARE_PROVIDER_SITE_OTHER): Payer: Medicare Other

## 2018-12-21 DIAGNOSIS — E611 Iron deficiency: Secondary | ICD-10-CM | POA: Diagnosis not present

## 2018-12-21 DIAGNOSIS — E1142 Type 2 diabetes mellitus with diabetic polyneuropathy: Secondary | ICD-10-CM

## 2018-12-21 DIAGNOSIS — E782 Mixed hyperlipidemia: Secondary | ICD-10-CM | POA: Diagnosis not present

## 2018-12-21 DIAGNOSIS — E041 Nontoxic single thyroid nodule: Secondary | ICD-10-CM

## 2018-12-21 DIAGNOSIS — Z1159 Encounter for screening for other viral diseases: Secondary | ICD-10-CM

## 2018-12-21 DIAGNOSIS — K760 Fatty (change of) liver, not elsewhere classified: Secondary | ICD-10-CM

## 2018-12-21 LAB — COMPREHENSIVE METABOLIC PANEL
ALT: 20 U/L (ref 0–53)
AST: 18 U/L (ref 0–37)
Albumin: 4.5 g/dL (ref 3.5–5.2)
Alkaline Phosphatase: 50 U/L (ref 39–117)
BUN: 14 mg/dL (ref 6–23)
CO2: 29 mEq/L (ref 19–32)
Calcium: 9.2 mg/dL (ref 8.4–10.5)
Chloride: 103 mEq/L (ref 96–112)
Creatinine, Ser: 1.18 mg/dL (ref 0.40–1.50)
GFR: 61.69 mL/min (ref >60.00)
Glucose, Bld: 105 mg/dL — ABNORMAL HIGH (ref 70–99)
Potassium: 4.6 mEq/L (ref 3.5–5.1)
Sodium: 140 mEq/L (ref 135–145)
Total Bilirubin: 0.4 mg/dL (ref 0.2–1.2)
Total Protein: 6.9 g/dL (ref 6.0–8.3)

## 2018-12-21 LAB — CBC WITH DIFFERENTIAL/PLATELET
Basophils Absolute: 0.1 10*3/uL (ref 0.0–0.1)
Basophils Relative: 0.8 % (ref 0.0–3.0)
Eosinophils Absolute: 0.3 10*3/uL (ref 0.0–0.7)
Eosinophils Relative: 3.2 % (ref 0.0–5.0)
HCT: 39.6 % (ref 39.0–52.0)
Hemoglobin: 12.8 g/dL — ABNORMAL LOW (ref 13.0–17.0)
Lymphocytes Relative: 27.2 % (ref 12.0–46.0)
Lymphs Abs: 2.3 10*3/uL (ref 0.7–4.0)
MCHC: 32.2 g/dL (ref 30.0–36.0)
MCV: 76 fl — ABNORMAL LOW (ref 78.0–100.0)
Monocytes Absolute: 0.5 10*3/uL (ref 0.1–1.0)
Monocytes Relative: 6.2 % (ref 3.0–12.0)
Neutro Abs: 5.3 10*3/uL (ref 1.4–7.7)
Neutrophils Relative %: 62.6 % (ref 43.0–77.0)
Platelets: 256 10*3/uL (ref 150.0–400.0)
RBC: 5.22 Mil/uL (ref 4.22–5.81)
RDW: 15.8 % — ABNORMAL HIGH (ref 11.5–15.5)
WBC: 8.4 10*3/uL (ref 4.0–10.5)

## 2018-12-21 LAB — LIPID PANEL
Cholesterol: 193 mg/dL (ref 0–200)
HDL: 31.7 mg/dL — ABNORMAL LOW (ref >39.00)
LDL Cholesterol: 135 mg/dL — ABNORMAL HIGH (ref 0–99)
NonHDL: 160.94
Total CHOL/HDL Ratio: 6
Triglycerides: 128 mg/dL (ref 0.0–149.0)
VLDL: 25.6 mg/dL (ref 0.0–40.0)

## 2018-12-21 LAB — HEMOGLOBIN A1C: Hgb A1c MFr Bld: 6.4 % (ref 4.6–6.5)

## 2018-12-21 NOTE — Addendum Note (Signed)
Addended by: Leeanne Rio on: 12/21/2018 09:54 AM   Modules accepted: Orders

## 2018-12-22 LAB — URINALYSIS, ROUTINE W REFLEX MICROSCOPIC
Bilirubin, UA: NEGATIVE
Glucose, UA: NEGATIVE
Ketones, UA: NEGATIVE
Leukocytes, UA: NEGATIVE
Nitrite, UA: NEGATIVE
Protein, UA: NEGATIVE
RBC, UA: NEGATIVE
Specific Gravity, UA: 1.012 (ref 1.005–1.030)
Urobilinogen, Ur: 0.2 mg/dL (ref 0.2–1.0)
pH, UA: 6 (ref 5.0–7.5)

## 2018-12-22 LAB — HEPATITIS A ANTIBODY, TOTAL: Hepatitis A AB,Total: REACTIVE — AB

## 2018-12-22 LAB — IRON,TIBC AND FERRITIN PANEL
%SAT: 5 % (calc) — ABNORMAL LOW (ref 20–48)
Ferritin: 9 ng/mL — ABNORMAL LOW (ref 24–380)
Iron: 25 ug/dL — ABNORMAL LOW (ref 50–180)
TIBC: 458 mcg/dL (calc) — ABNORMAL HIGH (ref 250–425)

## 2018-12-22 LAB — TSH: TSH: 1.81 u[IU]/mL (ref 0.35–4.50)

## 2018-12-22 LAB — HEPATITIS B SURFACE ANTIBODY, QUANTITATIVE: Hep B S AB Quant (Post): >1000 m[IU]/mL (ref >=10)

## 2018-12-23 ENCOUNTER — Encounter: Payer: Self-pay | Admitting: Internal Medicine

## 2019-02-02 DIAGNOSIS — L570 Actinic keratosis: Secondary | ICD-10-CM | POA: Diagnosis not present

## 2019-02-02 DIAGNOSIS — L814 Other melanin hyperpigmentation: Secondary | ICD-10-CM | POA: Diagnosis not present

## 2019-02-02 DIAGNOSIS — C4362 Malignant melanoma of left upper limb, including shoulder: Secondary | ICD-10-CM | POA: Diagnosis not present

## 2019-02-02 DIAGNOSIS — Z1283 Encounter for screening for malignant neoplasm of skin: Secondary | ICD-10-CM | POA: Diagnosis not present

## 2019-02-02 DIAGNOSIS — L820 Inflamed seborrheic keratosis: Secondary | ICD-10-CM | POA: Diagnosis not present

## 2019-02-02 DIAGNOSIS — D485 Neoplasm of uncertain behavior of skin: Secondary | ICD-10-CM | POA: Diagnosis not present

## 2019-02-02 DIAGNOSIS — C439 Malignant melanoma of skin, unspecified: Secondary | ICD-10-CM

## 2019-02-02 DIAGNOSIS — L821 Other seborrheic keratosis: Secondary | ICD-10-CM | POA: Diagnosis not present

## 2019-02-02 HISTORY — DX: Malignant melanoma of skin, unspecified: C43.9

## 2019-02-17 ENCOUNTER — Encounter: Payer: Self-pay | Admitting: Internal Medicine

## 2019-02-21 ENCOUNTER — Encounter: Payer: Self-pay | Admitting: Internal Medicine

## 2019-02-21 ENCOUNTER — Ambulatory Visit (INDEPENDENT_AMBULATORY_CARE_PROVIDER_SITE_OTHER): Payer: Medicare Other | Admitting: Internal Medicine

## 2019-02-21 DIAGNOSIS — E119 Type 2 diabetes mellitus without complications: Secondary | ICD-10-CM | POA: Diagnosis not present

## 2019-02-21 DIAGNOSIS — E1142 Type 2 diabetes mellitus with diabetic polyneuropathy: Secondary | ICD-10-CM

## 2019-02-21 DIAGNOSIS — Z125 Encounter for screening for malignant neoplasm of prostate: Secondary | ICD-10-CM

## 2019-02-21 DIAGNOSIS — I471 Supraventricular tachycardia: Secondary | ICD-10-CM | POA: Diagnosis not present

## 2019-02-21 DIAGNOSIS — N400 Enlarged prostate without lower urinary tract symptoms: Secondary | ICD-10-CM | POA: Diagnosis not present

## 2019-02-21 DIAGNOSIS — E041 Nontoxic single thyroid nodule: Secondary | ICD-10-CM | POA: Diagnosis not present

## 2019-02-21 DIAGNOSIS — E559 Vitamin D deficiency, unspecified: Secondary | ICD-10-CM

## 2019-02-21 DIAGNOSIS — E611 Iron deficiency: Secondary | ICD-10-CM | POA: Diagnosis not present

## 2019-02-21 DIAGNOSIS — E785 Hyperlipidemia, unspecified: Secondary | ICD-10-CM

## 2019-02-21 DIAGNOSIS — C439 Malignant melanoma of skin, unspecified: Secondary | ICD-10-CM | POA: Insufficient documentation

## 2019-02-21 MED ORDER — METOPROLOL SUCCINATE ER 25 MG PO TB24: 25.0000 mg | ORAL_TABLET | Freq: Every day | ORAL | 3 refills | Status: DC

## 2019-02-21 NOTE — Progress Notes (Signed)
Virtual Visit via Video Note  I connected with Jackson [name]  on 02/21/19 at  2:45 PM EDT by a video enabled telemedicine application and verified that I am speaking with the correct person using two identifiers.  Location patient: home Location provider:work Persons participating in the virtual visit: patient, provider  I discussed the limitations of evaluation and management by telemedicine and the availability of in person appointments. The patient expressed understanding and agreed to proceed.   HPI: 1. DM 2 A1C  Was 6.4 12/21/2018 declines statin and zetia in the past didn't help cholesterol  2 melanoma arm pending excision with Dr. Laurence Ferrari upcoming no FH MM this lesion was getting larger and darker  3. Iron def he reports he was giving blood freq has stopped for now and started iron supplement  4. Weight down to 218 trying goal 190-195 enjoys riding his bike    ROS: See pertinent positives and negatives per HPI.  Past Medical History:  Diagnosis Date  . Allergy   . Anemia   . Arthritis   . CAD (coronary artery disease)   . Cancer (HCC)    BASAL CELL-FACE  . Complication of anesthesia    PT IS A RED HEAD AND STATES IT TAKES ALOT MORE ANESTHESIA TO SEDATE HIM  . Diabetes mellitus without complication ([place])   . Dysrhythmia    SVT  . Fatty liver   . GERD (gastroesophageal reflux disease)   . Hyperlipidemia   . Multiple thyroid nodules   . Rheumatic fever   . Sleep apnea 1997   Uses C-Pap machine  . SVT (supraventricular tachycardia) ([place])   . Thyroid nodule     Past Surgical History:  Procedure Laterality Date  . APPENDECTOMY  1965  . BICEPS TENDON REPAIR Right 05/2015  . COLONOSCOPY  2016  . LUMBAR FUSION  2015   L4/L5  . RHINOPLASTY  1997  . TONSILLECTOMY  1956  . VASECTOMY  1981    Family History  Problem Relation Age of Onset  . Arthritis Mother   . Lung cancer Mother   . Hyperlipidemia Mother   . Diabetes Mother   . Arthritis Father   . Diabetes Father    . Lung disease Father   . Hyperlipidemia Maternal Grandmother   . Alcohol abuse Maternal Grandfather   . Hyperlipidemia Maternal Grandfather   . Drug abuse Cousin     SOCIAL HX: 2 daughters, married    Current Outpatient Medications:  .  azelastine (ASTELIN) 0.1 % nasal spray, Place 1 spray into both nostrils 2 (two) times daily. Use in each nostril as directed, Disp: 90 mL, Rfl: 3 .  cetirizine (ZYRTEC) 10 MG tablet, Take 10 mg by mouth at bedtime. , Disp: , Rfl:  .  Cinnamon 500 MG capsule, Take 500 mg by mouth daily., Disp: , Rfl:  .  ciprofloxacin-dexamethasone (CIPRODEX) OTIC suspension, Place 4 drops into both ears 2 (two) times daily. X 1 week, Disp: 7.5 mL, Rfl: 0 .  Coenzyme Q10 (COQ10) 200 MG CAPS, Take 200 mg by mouth daily. , Disp: , Rfl:  .  ketoconazole (NIZORAL) 2 % shampoo, , Disp: , Rfl:  .  metFORMIN (GLUCOPHAGE) 1000 MG tablet, Take 1 tablet (1,000 mg total) by mouth 2 (two) times daily with a meal. . (Patient taking differently: Take 1,000 mg by mouth at bedtime. Marland Kitchen), Disp: 180 tablet, Rfl: 3 .  metoprolol succinate (TOPROL-XL) 25 MG 24 hr tablet, Take 1 tablet (25 mg total) by mouth  daily. Take with or immediately following a meal., Disp: 90 tablet, Rfl: 3 .  Multiple Vitamins-Minerals (MULTIVITAMIN ADULT PO), Take 1 tablet by mouth daily. , Disp: , Rfl:  .  Omega-3 Fatty Acids (FISH OIL) 1200 MG CPDR, Take 1 tablet by mouth daily., Disp: , Rfl:  .  omeprazole (PRILOSEC) 40 MG capsule, Take 1 capsule (40 mg total) by mouth daily., Disp: 90 capsule, Rfl: 3 .  OVER THE COUNTER MEDICATION, Take 1 tablet by mouth daily. Osteo biflex , Disp: , Rfl:  .  sitaGLIPtin (JANUVIA) 50 MG tablet, Take 1 tablet (50 mg total) by mouth daily., Disp: 90 tablet, Rfl: 3 .  vitamin C (ASCORBIC ACID) 500 MG tablet, Take 1,000 mg by mouth daily. , Disp: , Rfl:  .  vitamin E 100 UNIT capsule, Take 400 Units by mouth daily. , Disp: , Rfl:   EXAM:  VITALS per patient if  applicable:  GENERAL: alert, oriented, appears well and in no acute distress  HEENT: atraumatic, conjunttiva clear, no obvious abnormalities on inspection of external nose and ears  NECK: normal movements of the head and neck  LUNGS: on inspection no signs of respiratory distress, breathing rate appears normal, no obvious gross SOB, gasping or wheezing  CV: no obvious cyanosis  MS: moves all visible extremities without noticeable abnormality  PSYCH/NEURO: pleasant and cooperative, no obvious depression or anxiety, speech and thought processing grossly intact  ASSESSMENT AND PLAN:  Discussed the following assessment and plan:  Type 2 diabetes mellitus without complication, without long-term current use of insulin ([place]) -sch fasting labs upcoming 06/21/2019   Melanoma of skin ([place]) get Dr Laurence Ferrari to fax report to me pending excision   Thyroid nodule F/u thyroid nodules 04/20/2019 [facility]   HLD -declines statin and zetia   Iron def  -repeat iron levels  If abnormal consider referral to GI   HM sch fasting labs  Flu shot utd  Tdap utd Had prevnar will need pna 23 in 1 year3/2020 do at f/u  Consider shingrix in futuregiven Rx today twinrix 3/3 immune MMR immune PSA had 06/20/18 normal 2.92 PSA 3.75 12/30/15, 3.60 09/23/15 Zoster had 03/13/15   hep C neg PSA2.92 06/20/18 f/u urology Dr. Bernardo Heater PSA stable DermDr. Laurence Ferrari LN2 arms, scalp f/u in 6 months from 05/2018  Colonoscopy 10/17/15 normal fair prep CT chest due 04/07/19  Sleep study 09/14/18 sleep med mild OSA total AHI 10 per hr, cpap titration 9 cm h20 but turned down to 8  -rec wt loss , nasal congestion, CPAP at 8 cm h20 he also had high periodic limb movement index with only few arousals ?RLS +/- insomnia   -CPAP 8 cm H20 humidified, [place] Full face mask large measures to maintain airway address nasal congestion      I discussed the assessment and treatment plan with the patient. The patient was  provided an opportunity to ask questions and all were answered. The patient agreed with the plan and demonstrated an understanding of the instructions.   The patient was advised to call back or seek an in-person evaluation if the symptoms worsen or if the condition fails to improve as anticipated.  Time spent 15 minutes  Delorise Jackson, MD

## 2019-02-22 DIAGNOSIS — D0362 Melanoma in situ of left upper limb, including shoulder: Secondary | ICD-10-CM | POA: Diagnosis not present

## 2019-02-22 DIAGNOSIS — C4362 Malignant melanoma of left upper limb, including shoulder: Secondary | ICD-10-CM | POA: Diagnosis not present

## 2019-03-02 ENCOUNTER — Encounter: Payer: Self-pay | Admitting: Internal Medicine

## 2019-03-02 DIAGNOSIS — C4491 Basal cell carcinoma of skin, unspecified: Secondary | ICD-10-CM | POA: Insufficient documentation

## 2019-03-22 ENCOUNTER — Other Ambulatory Visit: Payer: Self-pay | Admitting: Internal Medicine

## 2019-03-22 DIAGNOSIS — E119 Type 2 diabetes mellitus without complications: Secondary | ICD-10-CM

## 2019-03-22 MED ORDER — OMEPRAZOLE 40 MG PO CPDR: 40.0000 mg | DELAYED_RELEASE_CAPSULE | Freq: Every day | ORAL | 3 refills | Status: DC

## 2019-03-22 MED ORDER — METFORMIN HCL 1000 MG PO TABS: 1000.0000 mg | ORAL_TABLET | Freq: Two times a day (BID) | ORAL | 3 refills | Status: DC

## 2019-04-20 DIAGNOSIS — E042 Nontoxic multinodular goiter: Secondary | ICD-10-CM | POA: Diagnosis not present

## 2019-04-26 ENCOUNTER — Telehealth: Payer: Self-pay | Admitting: Internal Medicine

## 2019-04-26 ENCOUNTER — Encounter: Payer: Self-pay | Admitting: Internal Medicine

## 2019-04-26 NOTE — Telephone Encounter (Signed)
Subject: Non-Urgent Medical Question             I pulled a muscle or something in my back about 10 days ago, which does not appear to be improving despite heating pads and cold packs.     SCHEDULE A VIRTUAL OR TELEPHONE VISIT 04/27/2019   THANKS Tms

## 2019-04-27 ENCOUNTER — Other Ambulatory Visit: Payer: Self-pay

## 2019-04-27 ENCOUNTER — Ambulatory Visit (INDEPENDENT_AMBULATORY_CARE_PROVIDER_SITE_OTHER): Payer: Medicare Other | Admitting: Internal Medicine

## 2019-04-27 DIAGNOSIS — M545 Low back pain, unspecified: Secondary | ICD-10-CM

## 2019-04-27 DIAGNOSIS — M5416 Radiculopathy, lumbar region: Secondary | ICD-10-CM

## 2019-04-27 DIAGNOSIS — E042 Nontoxic multinodular goiter: Secondary | ICD-10-CM | POA: Diagnosis not present

## 2019-04-27 MED ORDER — TRAMADOL HCL 50 MG PO TABS: 50.0000 mg | ORAL_TABLET | Freq: Two times a day (BID) | ORAL | 0 refills | Status: AC | PRN

## 2019-04-27 MED ORDER — CYCLOBENZAPRINE HCL 5 MG PO TABS: 5.0000 mg | ORAL_TABLET | Freq: Every evening | ORAL | 2 refills | Status: DC | PRN

## 2019-04-27 NOTE — Progress Notes (Addendum)
Telephone Note  I connected with Jackson [name]   on 04/27/19 at 11:00 AM EDT by a telephone and verified that I am speaking with the correct person using two identifiers.  Location patient: home Location provider:work  Persons participating in the virtual visit: patient, provider  I discussed the limitations of evaluation and management by telemedicine and the availability of in person appointments. The patient expressed understanding and agreed to proceed.   HPI: Acute low back pain 7-8/10 which is above L4/5 but moves up but not constant. If he moves the wrong way worse and radiates to his right hip like a muscle spasm. MRI in 2014 lumbar was abnormal with degenerative changes and disc issues so he had titanium cage and removal of L4/5 discs. Trigger to back pain was a sneeze. He has tried heat, ice, Advil w/o relief. He does not have pain with sitting/or standing. He also has tried back stretches w/o relief.    ROS: See pertinent positives and negatives per HPI.  Past Medical History:  Diagnosis Date  . Allergy   . Anemia   . Arthritis   . CAD (coronary artery disease)   . Cancer (HCC)    BASAL CELL-FACE  . Complication of anesthesia    PT IS A RED HEAD AND STATES IT TAKES ALOT MORE ANESTHESIA TO SEDATE HIM  . Diabetes mellitus without complication ([place])   . Dysrhythmia    SVT  . Fatty liver   . GERD (gastroesophageal reflux disease)   . Hyperlipidemia   . Multiple thyroid nodules   . Rheumatic fever   . Sleep apnea 1997   Uses C-Pap machine  . SVT (supraventricular tachycardia) ([place])   . Thyroid nodule     Past Surgical History:  Procedure Laterality Date  . APPENDECTOMY  1965  . BICEPS TENDON REPAIR Right 05/2015  . COLONOSCOPY  2016  . LUMBAR FUSION  2015   L4/L5  . RHINOPLASTY  1997  . TONSILLECTOMY  1956  . VASECTOMY  1981    Family History  Problem Relation Age of Onset  . Arthritis Mother   . Lung cancer Mother   . Hyperlipidemia Mother   . Diabetes  Mother   . Arthritis Father   . Diabetes Father   . Lung disease Father   . Hyperlipidemia Maternal Grandmother   . Alcohol abuse Maternal Grandfather   . Hyperlipidemia Maternal Grandfather   . Drug abuse Cousin     SOCIAL HX: married with kids    Current Outpatient Medications:  .  azelastine (ASTELIN) 0.1 % nasal spray, Place 1 spray into both nostrils 2 (two) times daily. Use in each nostril as directed, Disp: 90 mL, Rfl: 3 .  cetirizine (ZYRTEC) 10 MG tablet, Take 10 mg by mouth at bedtime. , Disp: , Rfl:  .  Cinnamon 500 MG capsule, Take 500 mg by mouth daily., Disp: , Rfl:  .  Coenzyme Q10 (COQ10) 200 MG CAPS, Take 200 mg by mouth daily. , Disp: , Rfl:  .  ketoconazole (NIZORAL) 2 % shampoo, , Disp: , Rfl:  .  metFORMIN (GLUCOPHAGE) 1000 MG tablet, Take 1 tablet (1,000 mg total) by mouth 2 (two) times daily with a meal. ., Disp: 180 tablet, Rfl: 3 .  metoprolol succinate (TOPROL-XL) 25 MG 24 hr tablet, Take 1 tablet (25 mg total) by mouth daily. Take with or immediately following a meal., Disp: 90 tablet, Rfl: 3 .  Multiple Vitamins-Minerals (MULTIVITAMIN ADULT PO), Take 1 tablet by mouth  daily. , Disp: , Rfl:  .  Omega-3 Fatty Acids (FISH OIL) 1200 MG CPDR, Take 1 tablet by mouth daily., Disp: , Rfl:  .  omeprazole (PRILOSEC) 40 MG capsule, Take 1 capsule (40 mg total) by mouth daily., Disp: 90 capsule, Rfl: 3 .  OVER THE COUNTER MEDICATION, Take 1 tablet by mouth daily. Osteo biflex , Disp: , Rfl:  .  sitaGLIPtin (JANUVIA) 50 MG tablet, Take 1 tablet (50 mg total) by mouth daily., Disp: 90 tablet, Rfl: 3 .  vitamin C (ASCORBIC ACID) 500 MG tablet, Take 1,000 mg by mouth daily. , Disp: , Rfl:  .  vitamin E 100 UNIT capsule, Take 400 Units by mouth daily. , Disp: , Rfl:  .  cyclobenzaprine (FLEXERIL) 5 MG tablet, Take 1-2 tablets (5-10 mg total) by mouth at bedtime as needed for muscle spasms., Disp: 60 tablet, Rfl: 2 .  traMADol (ULTRAM) 50 MG tablet, Take 1 tablet (50 mg total)  by mouth every 12 (twelve) hours as needed for up to 5 days., Disp: 10 tablet, Rfl: 0  EXAM:  VITALS per patient if applicable:  GENERAL: alert, oriented, appears well and in no acute distress  PSYCH/NEURO: pleasant and cooperative, no obvious depression or anxiety, speech and thought processing grossly intact  ASSESSMENT AND PLAN:  Discussed the following assessment and plan:  Low back pain/lumbar radiculopathy Reviewed your MRI low back from 2014 =arthritis and spinal stenosis pressing on a nerve root  Impression:   1.L4-5 trace retrolisthesis, mild degenerative changes and a moderate-sized right lateral recess disc extrusion cause mild central canal stenosis and compression of the right L5 nerve root.Previous right hemilaminotomy with a moderate amount of  inflammatory and/or granulation material surrounding the disc herniation and right L5 nerve root.   2.L5-S1 multifactorial moderate right neural foraminal stenosis.Moderate left-sided facet degenerative changes.    Plan  -will try flexeril 5-10 mg qhs prn and Tramadol 50 mg bid prn  Pt wants to hold on MRI and NS consult but if pain continues will do both in 2 weeks    I discussed the assessment and treatment plan with the patient. The patient was provided an opportunity to ask questions and all were answered. The patient agreed with the plan and demonstrated an understanding of the instructions.   The patient was advised to call back or seek an in-person evaluation if the symptoms worsen or if the condition fails to improve as anticipated.  Time spent 15 minutes  Delorise Jackson, MD

## 2019-05-02 ENCOUNTER — Other Ambulatory Visit: Payer: Self-pay | Admitting: Internal Medicine

## 2019-05-02 ENCOUNTER — Encounter: Payer: Self-pay | Admitting: Internal Medicine

## 2019-05-02 DIAGNOSIS — G8929 Other chronic pain: Secondary | ICD-10-CM

## 2019-05-02 DIAGNOSIS — M5416 Radiculopathy, lumbar region: Secondary | ICD-10-CM

## 2019-05-02 DIAGNOSIS — M48061 Spinal stenosis, lumbar region without neurogenic claudication: Secondary | ICD-10-CM

## 2019-05-02 DIAGNOSIS — M549 Dorsalgia, unspecified: Secondary | ICD-10-CM

## 2019-05-02 DIAGNOSIS — Z9889 Other specified postprocedural states: Secondary | ICD-10-CM

## 2019-05-02 DIAGNOSIS — M545 Low back pain, unspecified: Secondary | ICD-10-CM

## 2019-05-09 DIAGNOSIS — M545 Low back pain: Secondary | ICD-10-CM | POA: Diagnosis not present

## 2019-05-09 DIAGNOSIS — M5416 Radiculopathy, lumbar region: Secondary | ICD-10-CM | POA: Diagnosis not present

## 2019-05-09 DIAGNOSIS — M542 Cervicalgia: Secondary | ICD-10-CM | POA: Diagnosis not present

## 2019-05-14 ENCOUNTER — Ambulatory Visit: Payer: Medicare Other

## 2019-05-23 ENCOUNTER — Encounter: Payer: Self-pay | Admitting: Internal Medicine

## 2019-05-23 DIAGNOSIS — G8929 Other chronic pain: Secondary | ICD-10-CM | POA: Diagnosis not present

## 2019-05-23 DIAGNOSIS — M5441 Lumbago with sciatica, right side: Secondary | ICD-10-CM | POA: Diagnosis not present

## 2019-05-24 DIAGNOSIS — Z85820 Personal history of malignant melanoma of skin: Secondary | ICD-10-CM | POA: Diagnosis not present

## 2019-05-24 DIAGNOSIS — D225 Melanocytic nevi of trunk: Secondary | ICD-10-CM | POA: Diagnosis not present

## 2019-05-24 DIAGNOSIS — Z85828 Personal history of other malignant neoplasm of skin: Secondary | ICD-10-CM | POA: Diagnosis not present

## 2019-05-24 DIAGNOSIS — D485 Neoplasm of uncertain behavior of skin: Secondary | ICD-10-CM | POA: Diagnosis not present

## 2019-05-24 DIAGNOSIS — Z1283 Encounter for screening for malignant neoplasm of skin: Secondary | ICD-10-CM | POA: Diagnosis not present

## 2019-05-24 DIAGNOSIS — L820 Inflamed seborrheic keratosis: Secondary | ICD-10-CM | POA: Diagnosis not present

## 2019-05-24 DIAGNOSIS — L570 Actinic keratosis: Secondary | ICD-10-CM | POA: Diagnosis not present

## 2019-05-30 DIAGNOSIS — M5441 Lumbago with sciatica, right side: Secondary | ICD-10-CM | POA: Diagnosis not present

## 2019-05-30 DIAGNOSIS — G8929 Other chronic pain: Secondary | ICD-10-CM | POA: Diagnosis not present

## 2019-06-01 ENCOUNTER — Other Ambulatory Visit: Payer: Self-pay

## 2019-06-01 ENCOUNTER — Other Ambulatory Visit (INDEPENDENT_AMBULATORY_CARE_PROVIDER_SITE_OTHER): Payer: Medicare Other

## 2019-06-01 DIAGNOSIS — E611 Iron deficiency: Secondary | ICD-10-CM | POA: Diagnosis not present

## 2019-06-01 DIAGNOSIS — E119 Type 2 diabetes mellitus without complications: Secondary | ICD-10-CM | POA: Diagnosis not present

## 2019-06-01 DIAGNOSIS — E559 Vitamin D deficiency, unspecified: Secondary | ICD-10-CM | POA: Diagnosis not present

## 2019-06-01 DIAGNOSIS — N400 Enlarged prostate without lower urinary tract symptoms: Secondary | ICD-10-CM

## 2019-06-01 DIAGNOSIS — Z125 Encounter for screening for malignant neoplasm of prostate: Secondary | ICD-10-CM | POA: Diagnosis not present

## 2019-06-01 LAB — VITAMIN D 25 HYDROXY (VIT D DEFICIENCY, FRACTURES): VITD: 35.27 ng/mL (ref 30.00–100.00)

## 2019-06-01 LAB — CBC WITH DIFFERENTIAL/PLATELET
Basophils Absolute: 0 10*3/uL (ref 0.0–0.1)
Basophils Relative: 0.5 % (ref 0.0–3.0)
Eosinophils Absolute: 0.3 10*3/uL (ref 0.0–0.7)
Eosinophils Relative: 3.5 % (ref 0.0–5.0)
HCT: 46.9 % (ref 39.0–52.0)
Hemoglobin: 15.7 g/dL (ref 13.0–17.0)
Lymphocytes Relative: 34.5 % (ref 12.0–46.0)
Lymphs Abs: 2.7 10*3/uL (ref 0.7–4.0)
MCHC: 33.5 g/dL (ref 30.0–36.0)
MCV: 89.9 fl (ref 78.0–100.0)
Monocytes Absolute: 0.6 10*3/uL (ref 0.1–1.0)
Monocytes Relative: 7.5 % (ref 3.0–12.0)
Neutro Abs: 4.3 10*3/uL (ref 1.4–7.7)
Neutrophils Relative %: 54 % (ref 43.0–77.0)
Platelets: 216 10*3/uL (ref 150.0–400.0)
RBC: 5.22 Mil/uL (ref 4.22–5.81)
RDW: 14.6 % (ref 11.5–15.5)
WBC: 7.9 10*3/uL (ref 4.0–10.5)

## 2019-06-01 LAB — COMPREHENSIVE METABOLIC PANEL
ALT: 28 U/L (ref 0–53)
AST: 19 U/L (ref 0–37)
Albumin: 4.5 g/dL (ref 3.5–5.2)
Alkaline Phosphatase: 43 U/L (ref 39–117)
BUN: 20 mg/dL (ref 6–23)
CO2: 30 mEq/L (ref 19–32)
Calcium: 9.8 mg/dL (ref 8.4–10.5)
Chloride: 101 mEq/L (ref 96–112)
Creatinine, Ser: 1.36 mg/dL (ref 0.40–1.50)
GFR: 52.3 mL/min — ABNORMAL LOW (ref >60.00)
Glucose, Bld: 120 mg/dL — ABNORMAL HIGH (ref 70–99)
Potassium: 4.2 mEq/L (ref 3.5–5.1)
Sodium: 139 mEq/L (ref 135–145)
Total Bilirubin: 0.6 mg/dL (ref 0.2–1.2)
Total Protein: 6.8 g/dL (ref 6.0–8.3)

## 2019-06-01 LAB — LIPID PANEL
Cholesterol: 225 mg/dL — ABNORMAL HIGH (ref 0–200)
HDL: 27.7 mg/dL — ABNORMAL LOW (ref >39.00)
NonHDL: 197.5
Total CHOL/HDL Ratio: 8
Triglycerides: 249 mg/dL — ABNORMAL HIGH (ref 0.0–149.0)
VLDL: 49.8 mg/dL — ABNORMAL HIGH (ref 0.0–40.0)

## 2019-06-01 LAB — HEMOGLOBIN A1C: Hgb A1c MFr Bld: 6.6 % — ABNORMAL HIGH (ref 4.6–6.5)

## 2019-06-01 LAB — PSA, MEDICARE: PSA: 4.93 ng/ml — ABNORMAL HIGH (ref 0.10–4.00)

## 2019-06-01 LAB — LDL CHOLESTEROL, DIRECT: Direct LDL: 159 mg/dL

## 2019-06-02 LAB — IRON,TIBC AND FERRITIN PANEL
%SAT: 25 % (calc) (ref 20–48)
Ferritin: 66 ng/mL (ref 24–380)
Iron: 87 ug/dL (ref 50–180)
TIBC: 354 mcg/dL (calc) (ref 250–425)

## 2019-06-06 ENCOUNTER — Ambulatory Visit: Payer: Medicare Other | Admitting: Internal Medicine

## 2019-06-08 DIAGNOSIS — M5441 Lumbago with sciatica, right side: Secondary | ICD-10-CM | POA: Diagnosis not present

## 2019-06-08 DIAGNOSIS — G8929 Other chronic pain: Secondary | ICD-10-CM | POA: Diagnosis not present

## 2019-06-13 ENCOUNTER — Ambulatory Visit (INDEPENDENT_AMBULATORY_CARE_PROVIDER_SITE_OTHER): Payer: Medicare Other | Admitting: Internal Medicine

## 2019-06-13 DIAGNOSIS — R972 Elevated prostate specific antigen [PSA]: Secondary | ICD-10-CM

## 2019-06-13 DIAGNOSIS — E1142 Type 2 diabetes mellitus with diabetic polyneuropathy: Secondary | ICD-10-CM | POA: Diagnosis not present

## 2019-06-13 DIAGNOSIS — M545 Low back pain, unspecified: Secondary | ICD-10-CM

## 2019-06-13 DIAGNOSIS — E785 Hyperlipidemia, unspecified: Secondary | ICD-10-CM

## 2019-06-13 DIAGNOSIS — M5441 Lumbago with sciatica, right side: Secondary | ICD-10-CM | POA: Diagnosis not present

## 2019-06-13 DIAGNOSIS — E041 Nontoxic single thyroid nodule: Secondary | ICD-10-CM

## 2019-06-13 DIAGNOSIS — D509 Iron deficiency anemia, unspecified: Secondary | ICD-10-CM | POA: Insufficient documentation

## 2019-06-13 DIAGNOSIS — G8929 Other chronic pain: Secondary | ICD-10-CM | POA: Diagnosis not present

## 2019-06-13 HISTORY — DX: Elevated prostate specific antigen (PSA): R97.20

## 2019-06-13 NOTE — Progress Notes (Signed)
Telephone Note  I connected with Jackson [name]   on 06/13/19 at  4:00 PM EDT telephone and verified that I am speaking with the correct person using two identifiers.  Location patient: home Location provider:work or home office Persons participating in the virtual visit: patient, provider  I discussed the limitations of evaluation and management by telemedicine and the availability of in person appointments. The patient expressed understanding and agreed to proceed.   HPI: 1. Elevated PSA >4 with h/o BPH will refer to Dr. Bernardo Heater for f/u  2. Low back pain better after seeing Dr. Lacinda Axon and doing PT no need for MRI low back now  3. Iron def anemia resolved he is taking iron daily  4. Thyroid nodules bx 04/2019 negative and f/u due in 1 year Dr. Jenetta Downer connell  5. hld declines statin or zetia or repatha for now     ROS: See pertinent positives and negatives per HPI.  Past Medical History:  Diagnosis Date  . Allergy   . Anemia   . Arthritis   . CAD (coronary artery disease)   . Cancer (HCC)    BASAL CELL-FACE  . Complication of anesthesia    PT IS A RED HEAD AND STATES IT TAKES ALOT MORE ANESTHESIA TO SEDATE HIM  . Diabetes mellitus without complication ([place])   . Dysrhythmia    SVT  . Fatty liver   . GERD (gastroesophageal reflux disease)   . Hyperlipidemia   . Multiple thyroid nodules   . Rheumatic fever   . Sleep apnea 1997   Uses C-Pap machine  . SVT (supraventricular tachycardia) ([place])   . Thyroid nodule     Past Surgical History:  Procedure Laterality Date  . APPENDECTOMY  1965  . BICEPS TENDON REPAIR Right 05/2015  . COLONOSCOPY  2016  . LUMBAR FUSION  2015   L4/L5  . RHINOPLASTY  1997  . TONSILLECTOMY  1956  . VASECTOMY  1981    Family History  Problem Relation Age of Onset  . Arthritis Mother   . Lung cancer Mother   . Hyperlipidemia Mother   . Diabetes Mother   . Arthritis Father   . Diabetes Father   . Lung disease Father   . Hyperlipidemia Maternal  Grandmother   . Alcohol abuse Maternal Grandfather   . Hyperlipidemia Maternal Grandfather   . Drug abuse Cousin     SOCIAL HX: married    Current Outpatient Medications:  .  cholecalciferol (VITAMIN D3) 25 MCG (1000 UT) tablet, Take 1,000 Units by mouth daily., Disp: , Rfl:  .  azelastine (ASTELIN) 0.1 % nasal spray, Place 1 spray into both nostrils 2 (two) times daily. Use in each nostril as directed, Disp: 90 mL, Rfl: 3 .  cetirizine (ZYRTEC) 10 MG tablet, Take 10 mg by mouth at bedtime. , Disp: , Rfl:  .  Cinnamon 500 MG capsule, Take 500 mg by mouth daily., Disp: , Rfl:  .  Coenzyme Q10 (COQ10) 200 MG CAPS, Take 200 mg by mouth daily. , Disp: , Rfl:  .  cyclobenzaprine (FLEXERIL) 5 MG tablet, Take 1-2 tablets (5-10 mg total) by mouth at bedtime as needed for muscle spasms., Disp: 60 tablet, Rfl: 2 .  ketoconazole (NIZORAL) 2 % shampoo, , Disp: , Rfl:  .  metFORMIN (GLUCOPHAGE) 1000 MG tablet, Take 1 tablet (1,000 mg total) by mouth 2 (two) times daily with a meal. ., Disp: 180 tablet, Rfl: 3 .  metoprolol succinate (TOPROL-XL) 25 MG 24 hr  tablet, Take 1 tablet (25 mg total) by mouth daily. Take with or immediately following a meal., Disp: 90 tablet, Rfl: 3 .  Multiple Vitamins-Minerals (MULTIVITAMIN ADULT PO), Take 1 tablet by mouth daily. , Disp: , Rfl:  .  Omega-3 Fatty Acids (FISH OIL) 1200 MG CPDR, Take 1 tablet by mouth daily., Disp: , Rfl:  .  omeprazole (PRILOSEC) 40 MG capsule, Take 1 capsule (40 mg total) by mouth daily., Disp: 90 capsule, Rfl: 3 .  OVER THE COUNTER MEDICATION, Take 1 tablet by mouth daily. Osteo biflex , Disp: , Rfl:  .  sitaGLIPtin (JANUVIA) 50 MG tablet, Take 1 tablet (50 mg total) by mouth daily., Disp: 90 tablet, Rfl: 3 .  vitamin C (ASCORBIC ACID) 500 MG tablet, Take 1,000 mg by mouth daily. , Disp: , Rfl:  .  vitamin E 100 UNIT capsule, Take 400 Units by mouth daily. , Disp: , Rfl:   EXAM:  VITALS per patient if applicable:  GENERAL: alert,  oriented, appears well and in no acute distress  PSYCH/NEURO: pleasant and cooperative, no obvious depression or anxiety, speech and thought processing grossly intact  ASSESSMENT AND PLAN:  Discussed the following assessment and plan:  Elevated PSA - Plan: Ambulatory referral to Urology Dr. Laurette Schimke   DM type 2 with diabetic peripheral neuropathy ([place]) - Plan: Comprehensive metabolic panel, Lipid panel, Hemoglobin A1c, Microalbumin / creatinine urine ratio -cont meds rec healthy diet and exercise   Hyperlipidemia, unspecified hyperlipidemia type - Plan: Lipid panel Declines statin, zetia disc repatha pt declines   Thyroid nodule - Plan: bx negative 04/2019 [facility] f/u in 1 year   Iron deficiency anemia, unspecified iron deficiency anemia type - Plan: CBC w/Diff  Midline low back pain, unspecified chronicity, unspecified whether sciatica present - Plan: hold on MRI low back for now   [facility] sch fasting labs 11/2019  Flu shot utd Tdap utd Had prevnar will need pna 23 in 1 year3/2020do at f/uin person   Consider shingrix in futuregiven Rx prev and he has  twinrix3/3 immune MMR immune PSA had 06/20/18 normal 2.92PSA 3.75 12/30/15, 3.60 09/23/15 Zoster had 03/13/15  hep C neg PSA4.93 06/01/19  2.92 06/20/18 f/u urology Dr. Bernardo Heater PSA stable -referred urology   DermDr. Laurence Ferrari LN2 arms, scalp due 08/2019 with h/o melanoma   Colonoscopy 10/17/15 normal fair prep consider repeat in 5 years   CT chest had 04/07/19  Sleep study 09/14/18 sleep med mild OSA total AHI 10 per hr, cpap titration 9 cm h20 but turned down to 8  -rec wt loss , nasal congestion, CPAP at 8 cm h20 he also had high periodic limb movement index with only few arousals ?RLS +/- insomnia   -CPAP 8 cm H20 humidified, [place] Full face mask large measures to maintain airway address nasal congestion    I discussed the assessment and treatment plan with the patient. The patient was provided an  opportunity to ask questions and all were answered. The patient agreed with the plan and demonstrated an understanding of the instructions.   The patient was advised to call back or seek an in-person evaluation if the symptoms worsen or if the condition fails to improve as anticipated.  Time spent 20 minutes  Delorise Jackson, MD

## 2019-06-14 ENCOUNTER — Other Ambulatory Visit: Payer: Self-pay

## 2019-06-16 DIAGNOSIS — M5441 Lumbago with sciatica, right side: Secondary | ICD-10-CM | POA: Diagnosis not present

## 2019-06-16 DIAGNOSIS — G8929 Other chronic pain: Secondary | ICD-10-CM | POA: Diagnosis not present

## 2019-06-19 DIAGNOSIS — M5441 Lumbago with sciatica, right side: Secondary | ICD-10-CM | POA: Diagnosis not present

## 2019-06-19 DIAGNOSIS — G8929 Other chronic pain: Secondary | ICD-10-CM | POA: Diagnosis not present

## 2019-06-21 DIAGNOSIS — M5441 Lumbago with sciatica, right side: Secondary | ICD-10-CM | POA: Diagnosis not present

## 2019-06-21 DIAGNOSIS — G8929 Other chronic pain: Secondary | ICD-10-CM | POA: Diagnosis not present

## 2019-06-26 DIAGNOSIS — M5441 Lumbago with sciatica, right side: Secondary | ICD-10-CM | POA: Diagnosis not present

## 2019-06-26 DIAGNOSIS — G8929 Other chronic pain: Secondary | ICD-10-CM | POA: Diagnosis not present

## 2019-06-30 DIAGNOSIS — M5441 Lumbago with sciatica, right side: Secondary | ICD-10-CM | POA: Diagnosis not present

## 2019-06-30 DIAGNOSIS — G8929 Other chronic pain: Secondary | ICD-10-CM | POA: Diagnosis not present

## 2019-07-10 ENCOUNTER — Other Ambulatory Visit: Payer: Self-pay

## 2019-07-10 ENCOUNTER — Encounter: Payer: Self-pay | Admitting: Urology

## 2019-07-10 ENCOUNTER — Ambulatory Visit (INDEPENDENT_AMBULATORY_CARE_PROVIDER_SITE_OTHER): Payer: Medicare Other | Admitting: Urology

## 2019-07-10 VITALS — BP 144/83 | HR 66 | Ht 73.0 in | Wt 230.0 lb

## 2019-07-10 DIAGNOSIS — N400 Enlarged prostate without lower urinary tract symptoms: Secondary | ICD-10-CM

## 2019-07-10 DIAGNOSIS — R972 Elevated prostate specific antigen [PSA]: Secondary | ICD-10-CM | POA: Diagnosis not present

## 2019-07-10 DIAGNOSIS — G8929 Other chronic pain: Secondary | ICD-10-CM | POA: Diagnosis not present

## 2019-07-10 DIAGNOSIS — M5441 Lumbago with sciatica, right side: Secondary | ICD-10-CM | POA: Diagnosis not present

## 2019-07-10 NOTE — Patient Instructions (Signed)
Prostate Cancer Screening  The prostate is a walnut-sized gland that is located below the bladder and in front of the rectum in males. The function of the prostate (prostate gland) is to add fluid to semen during ejaculation. Prostate cancer is the second most common type of cancer in men. A screening test for cancer is a test that is done before cancer symptoms start. Screening can help to identify cancer at an early stage, when the cancer can be treated more easily. The recommended prostate cancer screening test is a blood test called the prostate-specific antigen (PSA) test. PSA is a protein that is made in the prostate. As you age, your prostate naturally produces more PSA. Abnormally high PSA levels may be caused by:  Prostate cancer.  An enlarged prostate that is not caused by cancer (benign prostatic hyperplasia, BPH). This condition is very common in older men.  A prostate gland infection (prostatitis).  Medicines to assist with hair growth, such as finasteride. Depending on the PSA results, you may need more tests, such as:  A physical exam to check the size of your prostate gland.  Blood and imaging tests.  A procedure to remove tissue samples from your prostate gland for testing (biopsy). Who should have screening? Screening recommendations vary based on age.  If you are younger than age 40, screening is not recommended.  If you are age 67-54 and you have no risk factors, screening is not recommended.  If you are younger than age 67, ask your health care provider if you need screening if you have one of these risk factors: ? Being of African-American descent. ? Having a family history of prostate cancer.  If you are age 67-69, talk with your health care provider about your need for screening and how often screening should be done.  If you are older than age 67, screening is not recommended. This is because the risks that screening can cause are greater than the benefits  that it may provide (risks outweigh the benefits). If you are at high risk for prostate cancer, your health care provider may recommend that you have screenings more often or start screening at a younger age. You may be at high risk if you:  Are older than age 67.  Are African-American.  Have a father, brother, or uncle who has been diagnosed with prostate cancer. The risk may be higher if your family member's cancer occurred at an early age. What are the benefits of screening? There is a small chance that screening may lower your risk of dying from prostate cancer. The chance is small because prostate cancer is typically a slow-growing cancer, and most men with prostate cancer die from a different cause. What are the risks of screening? The main risk of prostate cancer screening is diagnosing and treating prostate cancer that would never have caused any symptoms or problems (overdiagnosis and overtreatment). PSA screening cannot tell you if your PSA is high due to cancer or a different cause. A prostate biopsy is the only procedure to diagnose prostate cancer. Even the results of a biopsy may not tell you if your cancer needs to be treated. Slow-growing prostate cancer may not need any treatment other than monitoring, so diagnosing and treating it may cause unnecessary stress or other side effects. A prostate biopsy may also cause:  Infection or fever.  A false negative. This is a result that shows that you do not have prostate cancer when you actually do have prostate cancer. Questions   to ask your health care provider  When should I start prostate cancer screening?  What is my risk for prostate cancer?  How often do I need screening?  What type of screening tests do I need?  How do I get my test results?  What do my results mean?  Do I need treatment? Contact a health care provider if:  You have difficulty urinating.  You have pain when you urinate or ejaculate.  You have  blood in your urine or semen.  You have pain in your back or in the area of your prostate.  You have trouble getting or maintaining an erection (erectile dysfunction, ED). Summary  Prostate cancer is a common type of cancer in men. The prostate (prostate gland) is located below the bladder and in front of the rectum. This gland adds fluid to semen during ejaculation.  Prostate cancer screening may identify cancer at an early stage, when the cancer can be treated more easily.  The prostate-specific antigen (PSA) test is the recommended screening test for prostate cancer.  Discuss the risks and benefits of prostate cancer screening with your health care provider. If you are age 67 or older, screening is likely to lead to more risks than benefits (risks outweigh the benefits). This information is not intended to replace advice given to you by your health care provider. Make sure you discuss any questions you have with your health care provider. Document Released: 07/23/2017 Document Revised: 09/24/2017 Document Reviewed: 07/23/2017 Elsevier Patient Education  2020 [employer].   Transrectal Ultrasound-Guided Prostate Biopsy This is a procedure to take samples of tissue from your prostate. Ultrasound images are used to guide the procedure. It is usually done to check for prostate cancer. What happens before the procedure? Staying hydrated Follow instructions about liquids. These may include:  Up to 2 hours before the procedure - you may drink clear liquids. This includes water, clear fruit juice, black coffee, and plain tea.  Eating and drinking restrictions Follow instructions about eating and drinking. These may include:  8 hours before the procedure - stop eating heavy meals or foods. This includes meat, fried foods, or fatty foods.  6 hours before the procedure - stop eating light meals or foods. This includes toast or cereal.  6 hours before the procedure - stop drinking milk or  drinks that have milk.  2 hours before the procedure - stop drinking clear liquids. Medicines Ask your doctor about:  Changing or stopping your normal medicines. This is important if you take diabetes medicines or blood thinners.  Taking over-the-counter medicines, vitamins, herbs, and supplements.  Taking medicines such as aspirin and ibuprofen. These medicines can thin your blood. Do not take these medicines unless your doctor tells you to take them. General instructions  You may be given antibiotic medicine. If so, take it as told by your doctor.  Liquid will be used to clear waste from your butt (enema).  You may have a blood sample taken.  You may have a pee (urine) sample taken.  Plan to have someone take you home after your procedure. What happens during the procedure?   To lower your risk of infection: ? Your health care team will wash or sanitize their hands. ? Hair may be removed from the area. ? Your skin will be cleaned with soap. ? You will be given antibiotics.  An IV will be placed into one of your veins.  You will be given one or both of these: ?  A medicine to help you relax. ? A medicine to numb the area.  You will lie on your left side. Your knees will be bent.  A probe with gel on it will be placed in your butt. Pictures will be taken of your prostate and the area around it.  Medicine will be used to numb your prostate.  A needle will be placed in your butt and moved to your prostate.  Prostate tissue will be removed.  The samples will be sent to a lab. The procedure may vary. What happens after the procedure?  You will be watched until the medicines you were given have worn off.  You may have some pain in your butt. You will be given medicine for it. Summary  This procedure is usually done to check for prostate cancer.  Before the procedure, ask your doctor about changing or stopping your medicines.  You may have some pain in your butt.  You will be given medicine for it.  Plan to have someone take you home after the procedure. This information is not intended to replace advice given to you by your health care provider. Make sure you discuss any questions you have with your health care provider. Document Released: 09/30/2009 Document Revised: 02/01/2019 Document Reviewed: 01/08/2017 Elsevier Patient Education  [employer].   Transrectal Ultrasound-Guided Prostate Biopsy, Care After This sheet gives you information about how to care for yourself after your procedure. Your health care provider may also give you more specific instructions. If you have problems or questions, contact your health care provider. What can I expect after the procedure? After the procedure, it is common to have:  Pain and discomfort near your rectum, especially while sitting.  Pink-colored urine due to small amounts of blood in your urine.  A burning feeling while urinating.  Blood in your stool (feces) or bleeding from your rectum.  Blood in your semen. Follow these instructions at home: Medicines  Take over-the-counter and prescription medicines only as told by your health care provider.  If you were prescribed antibiotic medicine, take it as told by your health care provider. Do not stop taking the antibiotic even if you start to feel better. Activity  Do not drive for 24 hours if you were given a medicine to help you relax (sedative) during your procedure.  Return to your normal activities as told by your health care provider. Ask your health care provider what activities are safe for you.  Ask your health care provider when it is okay for you to resume sexual activity.  Do not lift anything that is heavier than 10 lb (4.5 kg), or the limit that you are told, until your health care provider says that it is safe. General instructions   Drink enough water to keep your urine pale yellow.  Watch your urine, stool, and semen for  new or increased bleeding.  Keep all follow-up visits as told by your health care provider. This is important. Contact a health care provider if:  You have any of the following: ? Blood clots in your urine or stool. ? Blood in your urine more than 2 weeks after the procedure. ? Blood in your semen more than 2 months after the procedure. ? New or increased bleeding in your urine, stool, or semen. ? Severe pain in your abdomen.  Your urine smells bad or unusual.  You have trouble urinating.  Your lower abdomen feels firm.  You have problems getting an erection.  You  have nausea or you vomit. Get help right away if:  You have a fever or chills. This could be a sign of infection.  You have bright red urine.  You have severe pain that does not get better with medicine.  You cannot urinate. Summary  After this procedure, it is common to have pain and discomfort around your rectum, especially while sitting.  You may have blood in your urine and stool after the procedure.  It is common to have blood in your semen for 1-2 months after this procedure.  If you were prescribed antibiotic medicine, take it as told by your health care provider. Do not stop taking the antibiotic even if you start to feel better.  Get help right away if you have a fever or chills. This could be a sign of infection. This information is not intended to replace advice given to you by your health care provider. Make sure you discuss any questions you have with your health care provider. Document Released: 04/05/2017 Document Revised: 02/01/2019 Document Reviewed: 04/05/2017 Elsevier Patient Education  2020 [employer].

## 2019-07-10 NOTE — Progress Notes (Signed)
   07/10/2019 3:05 PM   Jackson [name] 09/21/1951 [id number]  Reason for visit: Elevated PSA  HPI: I saw Jackson [name] in urology clinic for follow-up of elevated PSA.  He was previously seen in April 2019 by Dr. Bernardo Heater for BPH with very mild symptoms.  He was recently found to have a mildly elevated PSA of 4.93 on 06/01/2019.  This was elevated from his baseline of approximately 3 over the last 3 years.  He has never undergone prostate biopsy before.  He denies any family history of prostate cancer.  He denies any significant urinary symptoms of weak stream, urgency, frequency, or feeling of incomplete emptying.  He denies any gross hematuria or dysuria.  Past surgical history is notable for an open appendectomy in 1964.  He denies any erectile dysfunction.   ROS: Please see flowsheet from today's date for complete review of systems.  Physical Exam: BP (!) 144/83   Pulse 66   Ht 6\' 1"  (1.854 m)   Wt 230 lb (104.3 kg)   BMI 30.34 kg/m    Constitutional:  Alert and oriented, No acute distress. Respiratory: Normal respiratory effort, no increased work of breathing. GI: Abdomen is soft, nontender, nondistended, no abdominal masses DRE: 50 g, smooth, no nodules or masses Skin: No rashes, bruises or suspicious lesions. Neurologic: Grossly intact, no focal deficits, moving all 4 extremities. Psychiatric: Normal mood and affect  Laboratory Data:  PSA history 06/01/2019: 4.93 05/2018: 2.92 05/2017: 3 05/2016: 3.4  Assessment & Plan:   In summary, the patient is a healthy 68 year old male with a mildly elevated isolated PSA of 4.93 with no family history of prostate cancer, and benign DRE.  We reviewed the implications of an elevated PSA and the uncertainty surrounding it. In general, a man's PSA increases with age and is produced by both normal and cancerous prostate tissue. The differential diagnosis for elevated PSA includes BPH, prostate cancer, infection, recent  intercourse/ejaculation, recent urethroscopic manipulation (foley placement/cystoscopy) or trauma, and prostatitis.   Management of an elevated PSA can include observation or prostate biopsy and we discussed this in detail. Our goal is to detect clinically significant prostate cancers, and manage with either active surveillance, surgery, or radiation for localized disease. Risks of prostate biopsy include bleeding, infection (including life threatening sepsis), pain, and lower urinary symptoms. Hematuria, hematospermia, and blood in the stool are all common after biopsy and can persist up to 4 weeks.   PSA with free/total ratio today, call with results Pursue prostate biopsy if PSA remains elevated  A total of 15 minutes were spent face-to-face with the patient, greater than 50% was spent in patient education, counseling, and coordination of care regarding PSA screening and biopsy.  Billey Co, [facility] [address] [place], [place] [address] [phone]

## 2019-07-11 ENCOUNTER — Telehealth: Payer: Self-pay

## 2019-07-11 DIAGNOSIS — R972 Elevated prostate specific antigen [PSA]: Secondary | ICD-10-CM

## 2019-07-11 LAB — PSA, TOTAL AND FREE
PSA, Free Pct: 17.4 %
PSA, Free: 0.75 ng/mL
Prostate Specific Ag, Serum: 4.3 ng/mL — ABNORMAL HIGH (ref 0.0–4.0)

## 2019-07-11 NOTE — Telephone Encounter (Signed)
-----   Message from Billey Co, MD sent at 07/11/2019  8:05 AM EDT ----- PSA came down to 4.3 from 4.9, would recommend repeat PSA in 6 weeks to trend the PSA, would only perform biopsy if PSA remained >4 at that time. Will call with his 6 week PSA results   Thanks Nickolas Madrid, MD 07/11/2019

## 2019-07-11 NOTE — Telephone Encounter (Signed)
Called pt no answer. Left detailed message per DPR for pt informing him of the information below. Pt advised to call office back to schedule lab appt. Lab ordered.

## 2019-07-13 DIAGNOSIS — G8929 Other chronic pain: Secondary | ICD-10-CM | POA: Diagnosis not present

## 2019-07-13 DIAGNOSIS — M5441 Lumbago with sciatica, right side: Secondary | ICD-10-CM | POA: Diagnosis not present

## 2019-07-24 DIAGNOSIS — G8929 Other chronic pain: Secondary | ICD-10-CM | POA: Diagnosis not present

## 2019-07-24 DIAGNOSIS — M5441 Lumbago with sciatica, right side: Secondary | ICD-10-CM | POA: Diagnosis not present

## 2019-07-30 DIAGNOSIS — Z23 Encounter for immunization: Secondary | ICD-10-CM | POA: Diagnosis not present

## 2019-08-24 DIAGNOSIS — L821 Other seborrheic keratosis: Secondary | ICD-10-CM | POA: Diagnosis not present

## 2019-08-24 DIAGNOSIS — L570 Actinic keratosis: Secondary | ICD-10-CM | POA: Diagnosis not present

## 2019-08-24 DIAGNOSIS — D485 Neoplasm of uncertain behavior of skin: Secondary | ICD-10-CM | POA: Diagnosis not present

## 2019-08-24 DIAGNOSIS — Z1283 Encounter for screening for malignant neoplasm of skin: Secondary | ICD-10-CM | POA: Diagnosis not present

## 2019-08-24 DIAGNOSIS — L304 Erythema intertrigo: Secondary | ICD-10-CM | POA: Diagnosis not present

## 2019-08-24 DIAGNOSIS — L814 Other melanin hyperpigmentation: Secondary | ICD-10-CM | POA: Diagnosis not present

## 2019-08-24 DIAGNOSIS — L820 Inflamed seborrheic keratosis: Secondary | ICD-10-CM | POA: Diagnosis not present

## 2019-08-24 DIAGNOSIS — Z86007 Personal history of in-situ neoplasm of skin: Secondary | ICD-10-CM | POA: Diagnosis not present

## 2019-08-24 DIAGNOSIS — D225 Melanocytic nevi of trunk: Secondary | ICD-10-CM | POA: Diagnosis not present

## 2019-09-07 ENCOUNTER — Ambulatory Visit (INDEPENDENT_AMBULATORY_CARE_PROVIDER_SITE_OTHER): Payer: Medicare Other

## 2019-09-07 ENCOUNTER — Ambulatory Visit: Payer: Medicare Other | Admitting: Internal Medicine

## 2019-09-07 ENCOUNTER — Other Ambulatory Visit: Payer: Self-pay

## 2019-09-07 DIAGNOSIS — Z0000 Encounter for general adult medical examination without abnormal findings: Secondary | ICD-10-CM | POA: Diagnosis not present

## 2019-09-07 NOTE — Patient Instructions (Addendum)
  Jackson [name] , Thank you for taking time to come for your Medicare Wellness Visit. I appreciate your ongoing commitment to your health goals. Please review the following plan we discussed and let me know if I can assist you in the future.   These are the goals we discussed: Goals      Patient Stated   . Increase physical activity (pt-stated)     Be more consistent with walking and bike riding while continuing to lose weight.       This is a list of the screening recommended for you and due dates:  Health Maintenance  Topic Date Due  . Eye exam for diabetics  10/20/2018  . Pneumonia vaccines (2 of 2 - PPSV23) 01/21/2019  . Urine Protein Check  06/21/2019  . Complete foot exam   11/23/2019  . Hemoglobin A1C  12/02/2019  . Colon Cancer Screening  10/26/2025  . Tetanus Vaccine  03/21/2028  . Flu Shot  Completed

## 2019-09-07 NOTE — Progress Notes (Signed)
Subjective:   Jackson [name] is a 68 y.o. male who presents for Medicare Annual/Subsequent preventive examination.  Review of Systems:  No ROS.  Medicare Wellness Virtual Visit.  Visual/audio telehealth visit, UTA vital signs.   See social history for additional risk factors.   Cardiac Risk Factors include: advanced age (>29men, >34 women);diabetes mellitus;hypertension;male gender     Objective:    Vitals: There were no vitals taken for this visit.  There is no height or weight on file to calculate BMI.  Advanced Directives 09/07/2019 10/03/2018 09/01/2018  Does Patient Have a Medical Advance Directive? No No No  Does patient want to make changes to medical advance directive? No - Patient declined - -  Would patient like information on creating a medical advance directive? - No - Patient declined No - Patient declined    Tobacco Social History   Tobacco Use  Smoking Status Never Smoker  Smokeless Tobacco Former Systems developer   Types: Snuff     Counseling given: Not Answered   Clinical Intake:  Pre-visit preparation completed: Yes        Diabetes: Yes(Followed by pcp)  How often do you need to have someone help you when you read instructions, pamphlets, or other written materials from your doctor or pharmacy?: 1 - Never  Interpreter Needed?: No     Past Medical History:  Diagnosis Date   Allergy    Anemia    Arthritis    CAD (coronary artery disease)    Cancer ([place])    BASAL CELL-FACE   Complication of anesthesia    PT IS A RED HEAD AND STATES IT TAKES ALOT MORE ANESTHESIA TO SEDATE HIM   Diabetes mellitus without complication ([place])    Dysrhythmia    SVT   Fatty liver    GERD (gastroesophageal reflux disease)    Hyperlipidemia    Multiple thyroid nodules    Rheumatic fever    Sleep apnea 1997   Uses C-Pap machine   SVT (supraventricular tachycardia) ([place])    Thyroid nodule    Past Surgical History:  Procedure Laterality Date    APPENDECTOMY  1965   BICEPS TENDON REPAIR Right 05/2015   COLONOSCOPY  2016   LUMBAR FUSION  2015   L4/L5   RHINOPLASTY  1997   TONSILLECTOMY  1956   VASECTOMY  1981   Family History  Problem Relation Age of Onset   Arthritis Mother    Lung cancer Mother    Hyperlipidemia Mother    Diabetes Mother    Arthritis Father    Diabetes Father    Lung disease Father    Hyperlipidemia Maternal Grandmother    Alcohol abuse Maternal Grandfather    Hyperlipidemia Maternal Grandfather    Drug abuse Cousin    Social History   Socioeconomic History   Marital status: Married    Spouse name: Not on file   Number of children: Not on file   Years of education: Not on file   Highest education level: Not on file  Occupational History   Not on file  Social Needs   Financial resource strain: Not hard at all   Food insecurity    Worry: Never true    Inability: Never true   Transportation needs    Medical: No    Non-medical: No  Tobacco Use   Smoking status: Never Smoker   Smokeless tobacco: Former Systems developer    Types: Snuff  Substance and Sexual Activity   Alcohol use: Yes  Alcohol/week: 1.0 - 2.0 standard drinks    Types: 1 - 2 Cans of beer per week    Comment: OCC   Drug use: No   Sexual activity: Not Currently  Lifestyle   Physical activity    Days per week: 3 days    Minutes per session: Not on file   Stress: Not at all  Relationships   Social connections    Talks on phone: Not on file    Gets together: Not on file    Attends religious service: Not on file    Active member of club or organization: Not on file    Attends meetings of clubs or organizations: Not on file    Relationship status: Married  Other Topics Concern   Not on file  Social History Narrative   Married    Former Nature conservation officer    2 daughters    Outpatient Encounter Medications as of 09/07/2019  Medication Sig   azelastine (ASTELIN) 0.1 % nasal spray Place 1 spray into  both nostrils 2 (two) times daily. Use in each nostril as directed   cetirizine (ZYRTEC) 10 MG tablet Take 10 mg by mouth at bedtime.    cholecalciferol (VITAMIN D3) 25 MCG (1000 UT) tablet Take 1,000 Units by mouth daily.   Cinnamon 500 MG capsule Take 500 mg by mouth daily.   Coenzyme Q10 (COQ10) 200 MG CAPS Take 200 mg by mouth daily.    ketoconazole (NIZORAL) 2 % shampoo    metoprolol succinate (TOPROL-XL) 25 MG 24 hr tablet Take 1 tablet (25 mg total) by mouth daily. Take with or immediately following a meal.   Multiple Vitamins-Minerals (MULTIVITAMIN ADULT PO) Take 1 tablet by mouth daily.    Omega-3 Fatty Acids (FISH OIL) 1200 MG CPDR Take 1 tablet by mouth daily.   omeprazole (PRILOSEC) 40 MG capsule Take 1 capsule (40 mg total) by mouth daily.   OVER THE COUNTER MEDICATION Take 1 tablet by mouth daily. Osteo biflex    vitamin C (ASCORBIC ACID) 500 MG tablet Take 1,000 mg by mouth daily.    vitamin E 100 UNIT capsule Take 400 Units by mouth daily.    metFORMIN (GLUCOPHAGE) 1000 MG tablet Take 1 tablet (1,000 mg total) by mouth 2 (two) times daily with a meal. . (Patient not taking: Reported on 09/07/2019)   sitaGLIPtin (JANUVIA) 50 MG tablet Take 1 tablet (50 mg total) by mouth daily. (Patient not taking: Reported on 09/07/2019)   No facility-administered encounter medications on file as of 09/07/2019.     Activities of Daily Living In your present state of health, do you have any difficulty performing the following activities: 09/07/2019  Hearing? N  Vision? N  Difficulty concentrating or making decisions? N  Walking or climbing stairs? N  Dressing or bathing? N  Doing errands, shopping? N  Preparing Food and eating ? N  Using the Toilet? N  In the past six months, have you accidently leaked urine? N  Do you have problems with loss of bowel control? N  Managing your Medications? N  Managing your Finances? N  Housekeeping or managing your Housekeeping? N  Some  recent data might be hidden    Patient Care Team: [name], [name], MD as PCP - General (Internal Medicine) Coral Spikes, DO as Consulting Physician (Family Medicine) Bary Castilla Forest Gleason, MD (General Surgery)   Assessment:   This is a routine wellness examination for Jackson [name].  Nurse connected with patient 09/07/19 at  9:00 AM  EST by a telephone enabled telemedicine application and verified that I am speaking with the correct person using two identifiers. Patient stated full name and DOB. Patient gave permission to continue with virtual visit. Patient's location was at home and Nurse's location was at [place] office.   Health Maintenance Due: -PNA - discussed; to be completed with doctor in visit or local pharmacy.  -Eye Exam- scheduled 09/08/19 Update all pending maintenance due as appropriate.   See completed HM at the end of note.   Eye: Visual acuity not assessed. Virtual visit. Wears corrective lenses. Followed by their ophthalmologist.  Retinopathy- none reported  Dental: Visits every 6 months.    Hearing: Demonstrates normal hearing during visit.  Safety:  Patient feels safe at home- yes Patient does have smoke detectors at home- yes Patient does wear sunscreen or protective clothing when in direct sunlight - yes Patient does wear seat belt when in a moving vehicle - yes Patient drives- yes Adequate lighting in walkways free from debris- yes Grab bars and handrails used as appropriate- yes Ambulates with no assistive device Cell phone on person when ambulating outside of the home- yes  Social: Alcohol intake - yes      Smoking history- former    Smokers in home? none Illicit drug use? none  Depression: PHQ 2 &9 complete. See screening below. Denies irritability, anhedonia, sadness/tearfullness.    Falls: See screening below.    Medication: Taking as directed and without issues.   Covid-19: Precautions and sickness symptoms discussed. Wears mask,  social distancing, hand hygiene as appropriate.   Activities of Daily Living Patient denies needing assistance with: household chores, feeding themselves, getting from bed to chair, getting to the toilet, bathing/showering, dressing, managing money, or preparing meals.    Memory: Patient is alert. Patient denies difficulty focusing or concentrating. Correctly identified the president of the [place], season and recall. Patient likes to read, works at the garden station, builds weather stations, [employer], complete puzzles for brain stimulation.   BMI- discussed the importance of a healthy diet, water intake and the benefits of aerobic exercise.  Educational material provided.  Physical activity- walking, bike riding, no routine. Post physical therapy back exercises.  Diet:  Low carb Water: good intake Caffeine: 1 cup of coffee, 1 red bull  Other Providers Patient Care Team: [name], [name], MD as PCP - General (Internal Medicine) Coral Spikes, DO as Consulting Physician (Family Medicine) Bary Castilla Forest Gleason, MD (General Surgery) Exercise Activities and Dietary recommendations Current Exercise Habits: Home exercise routine, Type of exercise: walking;calisthenics, Intensity: Mild  Goals      Patient Stated    Increase physical activity (pt-stated)     Be more consistent with walking and bike riding while continuing to lose weight.       Fall Risk Fall Risk  09/07/2019 04/27/2019 11/24/2018 11/17/2018 11/10/2018  Falls in the past year? 0 0 (No Data) 0 (No Data)  Comment - - no falls since previous visit - no falls since previous visit  Number falls in past yr: - - - - -  Injury with Fall? - - - - -   Timed Get Up and Go Performed: no, virtual visit  Depression Screen PHQ 2/9 Scores 09/07/2019 04/27/2019 10/03/2018 09/01/2018  PHQ - 2 Score 0 0 0 0    Cognitive Function MMSE - Mini Mental State Exam 09/01/2018  Orientation to time 5  Orientation to Place 5    Registration 3  Attention/ Calculation 5  Recall 3  Language- name 2 objects 2  Language- repeat 1  Language- follow 3 step command 3  Language- read & follow direction 1  Write a sentence 1  Copy design 1  Total score 30     6CIT Screen 09/07/2019  What Year? 0 points  What month? 0 points  What time? 0 points  Count back from 20 0 points  Months in reverse 0 points  Repeat phrase 0 points  Total Score 0    Immunization History  Administered Date(s) Administered   Hep A / Hep B 03/03/2018, 03/31/2018, 09/01/2018   Influenza, High Dose Seasonal PF 07/04/2018   Influenza,inj,Quad PF,6+ Mos 07/20/2017   Influenza-Unspecified 07/04/2016   Pneumococcal Conjugate-13 01/20/2018   Tdap 03/21/2018   Screening Tests Health Maintenance  Topic Date Due   OPHTHALMOLOGY EXAM  10/20/2018   PNA vac Low Risk Adult (2 of 2 - PPSV23) 01/21/2019   URINE MICROALBUMIN  06/21/2019   FOOT EXAM  11/23/2019   HEMOGLOBIN A1C  12/02/2019   COLONOSCOPY  10/26/2025   TETANUS/TDAP  03/21/2028   INFLUENZA VACCINE  Completed       Plan:   Keep all routine maintenance appointments.   Next scheduled lab 12/11/18  Follow up 2/23 /20  Medicare Attestation I have personally reviewed: The patient's medical and social history Their use of alcohol, tobacco or illicit drugs Their current medications and supplements The patient's functional ability including ADLs,fall risks, home safety risks, cognitive, and hearing and visual impairment Diet and physical activities Evidence for depression   In addition, I have reviewed and discussed with patient certain preventive protocols, quality metrics, and best practice recommendations. A written personalized care plan for preventive services as well as general preventive health recommendations were provided to patient via mail.     Varney Biles, LPN  [phone]

## 2019-11-01 ENCOUNTER — Other Ambulatory Visit: Payer: Self-pay | Admitting: Internal Medicine

## 2019-11-01 DIAGNOSIS — J309 Allergic rhinitis, unspecified: Secondary | ICD-10-CM

## 2019-11-01 MED ORDER — AZELASTINE HCL 0.1 % NA SOLN: 1.0000 | Freq: Two times a day (BID) | NASAL | 3 refills | Status: DC

## 2019-11-22 DIAGNOSIS — D485 Neoplasm of uncertain behavior of skin: Secondary | ICD-10-CM | POA: Diagnosis not present

## 2019-11-22 DIAGNOSIS — L821 Other seborrheic keratosis: Secondary | ICD-10-CM | POA: Diagnosis not present

## 2019-11-22 DIAGNOSIS — B079 Viral wart, unspecified: Secondary | ICD-10-CM | POA: Diagnosis not present

## 2019-11-22 DIAGNOSIS — D1800 Hemangioma unspecified site: Secondary | ICD-10-CM | POA: Diagnosis not present

## 2019-11-22 DIAGNOSIS — L918 Other hypertrophic disorders of the skin: Secondary | ICD-10-CM | POA: Diagnosis not present

## 2019-11-22 DIAGNOSIS — Z1283 Encounter for screening for malignant neoplasm of skin: Secondary | ICD-10-CM | POA: Diagnosis not present

## 2019-11-22 DIAGNOSIS — Z86007 Personal history of in-situ neoplasm of skin: Secondary | ICD-10-CM | POA: Diagnosis not present

## 2019-11-22 DIAGNOSIS — L814 Other melanin hyperpigmentation: Secondary | ICD-10-CM | POA: Diagnosis not present

## 2019-11-22 DIAGNOSIS — L570 Actinic keratosis: Secondary | ICD-10-CM | POA: Diagnosis not present

## 2019-11-22 DIAGNOSIS — L738 Other specified follicular disorders: Secondary | ICD-10-CM | POA: Diagnosis not present

## 2019-11-22 DIAGNOSIS — L820 Inflamed seborrheic keratosis: Secondary | ICD-10-CM | POA: Diagnosis not present

## 2019-11-22 DIAGNOSIS — L219 Seborrheic dermatitis, unspecified: Secondary | ICD-10-CM | POA: Diagnosis not present

## 2019-11-22 DIAGNOSIS — L578 Other skin changes due to chronic exposure to nonionizing radiation: Secondary | ICD-10-CM | POA: Diagnosis not present

## 2019-12-05 ENCOUNTER — Other Ambulatory Visit: Payer: Self-pay

## 2019-12-05 ENCOUNTER — Encounter: Payer: Self-pay | Admitting: Internal Medicine

## 2019-12-05 ENCOUNTER — Ambulatory Visit
Admission: EM | Admit: 2019-12-05 | Discharge: 2019-12-05 | Disposition: A | Discharge status: Home / Self Care | Payer: Medicare Other | Attending: Emergency Medicine | Admitting: Emergency Medicine

## 2019-12-05 ENCOUNTER — Encounter: Payer: Self-pay | Admitting: Emergency Medicine

## 2019-12-05 DIAGNOSIS — R197 Diarrhea, unspecified: Secondary | ICD-10-CM | POA: Diagnosis not present

## 2019-12-05 DIAGNOSIS — R519 Headache, unspecified: Secondary | ICD-10-CM

## 2019-12-05 DIAGNOSIS — B349 Viral infection, unspecified: Secondary | ICD-10-CM | POA: Diagnosis not present

## 2019-12-05 DIAGNOSIS — R05 Cough: Secondary | ICD-10-CM | POA: Diagnosis not present

## 2019-12-05 LAB — POC SARS CORONAVIRUS 2 AG -  ED: SARS Coronavirus 2 Ag: NEGATIVE

## 2019-12-05 MED ORDER — BENZONATATE 100 MG PO CAPS: 100.0000 mg | ORAL_CAPSULE | Freq: Three times a day (TID) | ORAL | 0 refills | Status: DC | PRN

## 2019-12-05 NOTE — ED Provider Notes (Signed)
[facility]    CSN: [id number] Arrival date & time: 12/05/19  1607      History   Chief Complaint Chief Complaint  Patient presents with  . Cough  . Headache  . Generalized Body Aches    HPI Jackson [name] is a 69 y.o. male.   Patient presents with cough, headache, body aches x2 days.  He reports his cough is occasionally productive of a small amount of green phlegm.  He also reports diarrhea initially but this has resolved.  He denies fever, chills, sore throat, shortness of breath, vomiting, rash, or other symptoms.  He has been treating his symptoms at home with OTC Delsym without relief.    The history is provided by the patient.    Past Medical History:  Diagnosis Date  . Allergy   . Anemia   . Arthritis   . CAD (coronary artery disease)   . Cancer ([facility])    BASAL CELL-FACE  . Complication of anesthesia    PT IS A RED HEAD AND STATES IT TAKES ALOT MORE ANESTHESIA TO SEDATE HIM  . Diabetes mellitus without complication ([place])   . Dysrhythmia    SVT  . Fatty liver   . GERD (gastroesophageal reflux disease)   . Hyperlipidemia   . Multiple thyroid nodules   . Rheumatic fever   . Sleep apnea 1997   Uses C-Pap machine  . SVT (supraventricular tachycardia) ([place])   . Thyroid nodule     Patient Active Problem List   Diagnosis Date Noted  . Elevated PSA 06/13/2019  . Iron deficiency anemia 06/13/2019  . Basal cell carcinoma of skin 03/02/2019  . Melanoma of skin ([place]) 02/21/2019  . AK (actinic keratosis) 11/23/2018  . Vertigo 11/23/2018  . Allergic rhinitis 07/04/2018  . Fatty liver 03/03/2018  . Bradycardia 03/03/2018  . Fatigue 01/23/2018  . Benign prostatic hyperplasia without lower urinary tract symptoms 01/23/2018  . Type 2 diabetes mellitus without complication, without long-term current use of insulin ([place]) 01/23/2018  . Thyroid nodule 01/20/2018  . Hyperlipidemia 11/26/2016  . DM type 2 with diabetic peripheral neuropathy ([place]) 05/27/2016  .  Paroxysmal SVT (supraventricular tachycardia) ([place]) 05/27/2016  . GERD (gastroesophageal reflux disease) 05/27/2016  . OSA (obstructive sleep apnea) 05/27/2016    Past Surgical History:  Procedure Laterality Date  . APPENDECTOMY  1965  . BICEPS TENDON REPAIR Right 05/2015  . COLONOSCOPY  2016  . LUMBAR FUSION  2015   L4/L5  . RHINOPLASTY  1997  . TONSILLECTOMY  1956  . VASECTOMY  1981       Home Medications    Prior to Admission medications   Medication Sig Start Date End Date Taking? Authorizing Provider  azelastine (ASTELIN) 0.1 % nasal spray Place 1 spray into both nostrils 2 (two) times daily. Use in each nostril as directed 11/01/19  Yes [name], [name], MD  cetirizine (ZYRTEC) 10 MG tablet Take 10 mg by mouth at bedtime.    Yes [provider]  cholecalciferol (VITAMIN D3) 25 MCG (1000 UT) tablet Take 1,000 Units by mouth daily.   Yes [provider]  Coenzyme Q10 (COQ10) 200 MG CAPS Take 200 mg by mouth daily.    Yes [provider]  ketoconazole (NIZORAL) 2 % shampoo  10/13/18  Yes [provider]  metFORMIN (GLUCOPHAGE) 1000 MG tablet Take 1 tablet (1,000 mg total) by mouth 2 (two) times daily with a meal. . 03/22/19  Yes [name], [name], MD  metoprolol succinate (TOPROL-XL) 25 MG 24 hr tablet Take 1 tablet (25 mg total) by mouth daily. Take with or immediately following a meal. 02/21/19  Yes [name], [name], MD  Multiple Vitamins-Minerals (MULTIVITAMIN ADULT PO) Take 1 tablet by mouth daily.    Yes [provider]  Omega-3 Fatty Acids (FISH OIL) 1200 MG CPDR Take 1 tablet by mouth daily.   Yes [provider]  omeprazole (PRILOSEC) 40 MG capsule Take 1 capsule (40 mg total) by mouth daily. 03/22/19  Yes [name], [name], MD  OVER THE COUNTER MEDICATION Take 1 tablet by mouth daily. Osteo biflex    Yes [provider]  vitamin C (ASCORBIC ACID) 500 MG tablet Take 1,000 mg by mouth  daily.    Yes [provider]  vitamin E 100 UNIT capsule Take 400 Units by mouth daily.    Yes [provider]  benzonatate (TESSALON) 100 MG capsule Take 1 capsule (100 mg total) by mouth 3 (three) times daily as needed for cough. 12/05/19   Sharion Balloon, NP  Cinnamon 500 MG capsule Take 500 mg by mouth daily.    [provider]  sitaGLIPtin (JANUVIA) 50 MG tablet Take 1 tablet (50 mg total) by mouth daily. Patient not taking: Reported on 09/07/2019 11/22/18   [name], [name], MD    Family History Family History  Problem Relation Age of Onset  . Arthritis Mother   . Lung cancer Mother   . Hyperlipidemia Mother   . Diabetes Mother   . Arthritis Father   . Diabetes Father   . Lung disease Father   . Hyperlipidemia Maternal Grandmother   . Alcohol abuse Maternal Grandfather   . Hyperlipidemia Maternal Grandfather   . Drug abuse Cousin     Social History Social History   Tobacco Use  . Smoking status: Never Smoker  . Smokeless tobacco: Former Systems developer    Types: Snuff  Substance Use Topics  . Alcohol use: Yes    Alcohol/week: 1.0 - 2.0 standard drinks    Types: 1 - 2 Cans of beer per week    Comment: OCC  . Drug use: No     Allergies   Statins   Review of Systems Review of Systems  Constitutional: Negative for chills and fever.  HENT: Negative for congestion, ear pain, rhinorrhea and sore throat.   Eyes: Negative for pain and visual disturbance.  Respiratory: Positive for cough. Negative for shortness of breath.   Cardiovascular: Negative for chest pain and palpitations.  Gastrointestinal: Positive for diarrhea. Negative for abdominal pain, nausea and vomiting.  Genitourinary: Negative for dysuria and hematuria.  Musculoskeletal: Negative for arthralgias and back pain.  Skin: Negative for color change and rash.  Neurological: Positive for headaches. Negative for seizures and syncope.  All other systems reviewed and are  negative.    Physical Exam Triage Vital Signs ED Triage Vitals  Enc Vitals Group     BP 12/05/19 1611 135/82     Pulse Rate 12/05/19 1611 65     Resp 12/05/19 1611 18     Temp 12/05/19 1611 98.5 F (36.9 C)     Temp Source 12/05/19 1611 Oral     SpO2 12/05/19 1611 96 %     Weight 12/05/19 1612 230 lb (104.3 kg)     Height 12/05/19 1612 6\' 1"  (1.854 m)     Head Circumference --      Peak Flow --      Pain Score  12/05/19 1612 3     Pain Loc --      Pain Edu? --      Excl. in [place]? --    No data found.  Updated Vital Signs BP 135/82 (BP Location: Left Arm)   Pulse 65   Temp 98.5 F (36.9 C) (Oral)   Resp 18   Ht 6\' 1"  (1.854 m)   Wt 230 lb (104.3 kg)   SpO2 96%   BMI 30.34 kg/m   Visual Acuity Right Eye Distance:   Left Eye Distance:   Bilateral Distance:    Right Eye Near:   Left Eye Near:    Bilateral Near:     Physical Exam Vitals and nursing note reviewed.  Constitutional:      General: He is not in acute distress.    Appearance: He is well-developed. He is not ill-appearing.  HENT:     Head: Normocephalic and atraumatic.     Right Ear: Tympanic membrane normal.     Left Ear: Tympanic membrane normal.     Nose: Nose normal.     Mouth/Throat:     Mouth: Mucous membranes are moist.     Pharynx: Oropharynx is clear.  Eyes:     Conjunctiva/sclera: Conjunctivae normal.  Cardiovascular:     Rate and Rhythm: Normal rate and regular rhythm.     Heart sounds: No murmur.  Pulmonary:     Effort: Pulmonary effort is normal. No respiratory distress.     Breath sounds: Normal breath sounds. No wheezing or rhonchi.  Abdominal:     General: Bowel sounds are normal.     Palpations: Abdomen is soft.     Tenderness: There is no abdominal tenderness. There is no guarding or rebound.  Musculoskeletal:     Cervical back: Neck supple.  Skin:    General: Skin is warm and dry.     Findings: No rash.  Neurological:     General: No focal deficit present.     Mental  Status: He is alert and oriented to person, place, and time.  Psychiatric:        Mood and Affect: Mood normal.        Behavior: Behavior normal.      UC Treatments / Results  Labs (all labs ordered are listed, but only abnormal results are displayed) Labs Reviewed  NOVEL CORONAVIRUS, NAA  POC SARS CORONAVIRUS 2 AG -  ED    EKG   Radiology No results found.  Procedures Procedures (including critical care time)  Medications Ordered in UC Medications - No data to display  Initial Impression / Assessment and Plan / UC Course  I have reviewed the triage vital signs and the nursing notes.  Pertinent labs & imaging results that were available during my care of the patient were reviewed by me and considered in my medical decision making (see chart for details).   Oral illness.  POC Covid negative; PCR pending.  Treating with Tessalon Perles as needed for cough.  Instructed patient he also can take Tylenol or ibuprofen as needed for fever or discomfort.  Instructed patient to self quarantine until the test result is back.  Instructed patient to go to the emergency department if he develops high fever, shortness of breath, severe diarrhea, or other concerning symptoms.  Patient agrees with plan of care.     Final Clinical Impressions(s) / UC Diagnoses   Final diagnoses:  Viral illness     Discharge Instructions  Take the [facility] as needed for your cough.  Additionally, take Tylenol or ibuprofen as needed for fever or discomfort.    Your rapid COVID test is negative; the send-out test is pending.  You should self quarantine until your test result is back and is negative.    Go to the emergency department if you develop high fever, shortness of breath, severe diarrhea, or other concerning symptoms.       ED Prescriptions    Medication Sig Dispense Auth. Provider   benzonatate (TESSALON) 100 MG capsule Take 1 capsule (100 mg total) by mouth 3 (three) times  daily as needed for cough. 21 capsule Sharion Balloon, NP     PDMP not reviewed this encounter.   Sharion Balloon, NP 12/05/19 [phone]

## 2019-12-05 NOTE — ED Triage Notes (Signed)
Patient in today c/o cough, headache and some body aches x2 days. Patient states he had diarrhea 2 days ago, but has since resolved. Patient denies fever. Patient has taken OTC Delsym and cough drops without relief.

## 2019-12-05 NOTE — Discharge Instructions (Signed)
Take the [facility] as needed for your cough.  Additionally, take Tylenol or ibuprofen as needed for fever or discomfort.    Your rapid COVID test is negative; the send-out test is pending.  You should self quarantine until your test result is back and is negative.    Go to the emergency department if you develop high fever, shortness of breath, severe diarrhea, or other concerning symptoms.

## 2019-12-06 LAB — NOVEL CORONAVIRUS, NAA: SARS-CoV-2, NAA: NOT DETECTED

## 2019-12-12 ENCOUNTER — Other Ambulatory Visit (INDEPENDENT_AMBULATORY_CARE_PROVIDER_SITE_OTHER): Payer: Medicare Other

## 2019-12-12 ENCOUNTER — Other Ambulatory Visit: Payer: Self-pay

## 2019-12-12 DIAGNOSIS — E785 Hyperlipidemia, unspecified: Secondary | ICD-10-CM

## 2019-12-12 DIAGNOSIS — D509 Iron deficiency anemia, unspecified: Secondary | ICD-10-CM | POA: Diagnosis not present

## 2019-12-12 DIAGNOSIS — E1142 Type 2 diabetes mellitus with diabetic polyneuropathy: Secondary | ICD-10-CM | POA: Diagnosis not present

## 2019-12-12 LAB — COMPREHENSIVE METABOLIC PANEL
ALT: 43 U/L (ref 0–53)
AST: 24 U/L (ref 0–37)
Albumin: 4.2 g/dL (ref 3.5–5.2)
Alkaline Phosphatase: 42 U/L (ref 39–117)
BUN: 19 mg/dL (ref 6–23)
CO2: 29 mEq/L (ref 19–32)
Calcium: 9.2 mg/dL (ref 8.4–10.5)
Chloride: 101 mEq/L (ref 96–112)
Creatinine, Ser: 1.23 mg/dL (ref 0.40–1.50)
GFR: 58.63 mL/min — ABNORMAL LOW (ref >60.00)
Glucose, Bld: 140 mg/dL — ABNORMAL HIGH (ref 70–99)
Potassium: 4.3 mEq/L (ref 3.5–5.1)
Sodium: 137 mEq/L (ref 135–145)
Total Bilirubin: 0.5 mg/dL (ref 0.2–1.2)
Total Protein: 6.8 g/dL (ref 6.0–8.3)

## 2019-12-12 LAB — CBC WITH DIFFERENTIAL/PLATELET
Basophils Absolute: 0.1 10*3/uL (ref 0.0–0.1)
Basophils Relative: 0.8 % (ref 0.0–3.0)
Eosinophils Absolute: 0.3 10*3/uL (ref 0.0–0.7)
Eosinophils Relative: 3.5 % (ref 0.0–5.0)
HCT: 45.3 % (ref 39.0–52.0)
Hemoglobin: 15.4 g/dL (ref 13.0–17.0)
Lymphocytes Relative: 34.8 % (ref 12.0–46.0)
Lymphs Abs: 3 10*3/uL (ref 0.7–4.0)
MCHC: 34.1 g/dL (ref 30.0–36.0)
MCV: 90.8 fl (ref 78.0–100.0)
Monocytes Absolute: 0.6 10*3/uL (ref 0.1–1.0)
Monocytes Relative: 6.6 % (ref 3.0–12.0)
Neutro Abs: 4.7 10*3/uL (ref 1.4–7.7)
Neutrophils Relative %: 54.3 % (ref 43.0–77.0)
Platelets: 214 10*3/uL (ref 150.0–400.0)
RBC: 4.99 Mil/uL (ref 4.22–5.81)
RDW: 13.7 % (ref 11.5–15.5)
WBC: 8.7 10*3/uL (ref 4.0–10.5)

## 2019-12-12 LAB — MICROALBUMIN / CREATININE URINE RATIO
Creatinine,U: 160 mg/dL
Microalb Creat Ratio: 0.6 mg/g (ref 0.0–30.0)
Microalb, Ur: 0.9 mg/dL (ref 0.0–1.9)

## 2019-12-12 LAB — HEMOGLOBIN A1C: Hgb A1c MFr Bld: 7.4 % — ABNORMAL HIGH (ref 4.6–6.5)

## 2019-12-12 LAB — LIPID PANEL
Cholesterol: 196 mg/dL (ref 0–200)
HDL: 29.7 mg/dL — ABNORMAL LOW (ref >39.00)
NonHDL: 166.16
Total CHOL/HDL Ratio: 7
Triglycerides: 205 mg/dL — ABNORMAL HIGH (ref 0.0–149.0)
VLDL: 41 mg/dL — ABNORMAL HIGH (ref 0.0–40.0)

## 2019-12-12 LAB — LDL CHOLESTEROL, DIRECT: Direct LDL: 144 mg/dL

## 2019-12-17 ENCOUNTER — Encounter: Payer: Self-pay | Admitting: Internal Medicine

## 2019-12-18 DIAGNOSIS — E119 Type 2 diabetes mellitus without complications: Secondary | ICD-10-CM | POA: Diagnosis not present

## 2019-12-18 LAB — HM DIABETES EYE EXAM

## 2019-12-19 ENCOUNTER — Encounter: Payer: Self-pay | Admitting: Internal Medicine

## 2019-12-19 ENCOUNTER — Ambulatory Visit (INDEPENDENT_AMBULATORY_CARE_PROVIDER_SITE_OTHER): Payer: Medicare Other | Admitting: Internal Medicine

## 2019-12-19 ENCOUNTER — Other Ambulatory Visit: Payer: Self-pay

## 2019-12-19 VITALS — BP 126/78 | Ht 73.0 in | Wt 230.0 lb

## 2019-12-19 DIAGNOSIS — I2510 Atherosclerotic heart disease of native coronary artery without angina pectoris: Secondary | ICD-10-CM

## 2019-12-19 DIAGNOSIS — G8929 Other chronic pain: Secondary | ICD-10-CM | POA: Diagnosis not present

## 2019-12-19 DIAGNOSIS — E785 Hyperlipidemia, unspecified: Secondary | ICD-10-CM

## 2019-12-19 DIAGNOSIS — M549 Dorsalgia, unspecified: Secondary | ICD-10-CM

## 2019-12-19 DIAGNOSIS — R05 Cough: Secondary | ICD-10-CM | POA: Diagnosis not present

## 2019-12-19 DIAGNOSIS — R059 Cough, unspecified: Secondary | ICD-10-CM

## 2019-12-19 DIAGNOSIS — E1165 Type 2 diabetes mellitus with hyperglycemia: Secondary | ICD-10-CM

## 2019-12-19 DIAGNOSIS — M545 Low back pain, unspecified: Secondary | ICD-10-CM

## 2019-12-19 DIAGNOSIS — R972 Elevated prostate specific antigen [PSA]: Secondary | ICD-10-CM | POA: Diagnosis not present

## 2019-12-19 MED ORDER — PEN NEEDLES 30G X 8 MM MISC: 1.0000 | 11 refills | Status: DC

## 2019-12-19 MED ORDER — TRULICITY 0.75 MG/0.5ML ~~LOC~~ SOAJ: 0.7500 mg | SUBCUTANEOUS | 2 refills | Status: DC

## 2019-12-19 NOTE — Patient Instructions (Addendum)
Low Back Sprain or Strain Rehab Ask your health care provider which exercises are safe for you. Do exercises exactly as told by your health care provider and adjust them as directed. It is normal to feel mild stretching, pulling, tightness, or discomfort as you do these exercises. Stop right away if you feel sudden pain or your pain gets worse. Do not begin these exercises until told by your health care provider. Stretching and range-of-motion exercises These exercises warm up your muscles and joints and improve the movement and flexibility of your back. These exercises also help to relieve pain, numbness, and tingling. Lumbar rotation  1. Lie on your back on a firm surface and bend your knees. 2. Straighten your arms out to your sides so each arm forms a 90-degree angle (right angle) with a side of your body. 3. Slowly move (rotate) both of your knees to one side of your body until you feel a stretch in your lower back (lumbar). Try not to let your shoulders lift off the floor. 4. Hold this position for __________ seconds. 5. Tense your abdominal muscles and slowly move your knees back to the starting position. 6. Repeat this exercise on the other side of your body. Repeat __________ times. Complete this exercise __________ times a day. Single knee to chest  1. Lie on your back on a firm surface with both legs straight. 2. Bend one of your knees. Use your hands to move your knee up toward your chest until you feel a gentle stretch in your lower back and buttock. ? Hold your leg in this position by holding on to the front of your knee. ? Keep your other leg as straight as possible. 3. Hold this position for __________ seconds. 4. Slowly return to the starting position. 5. Repeat with your other leg. Repeat __________ times. Complete this exercise __________ times a day. Prone extension on elbows  1. Lie on your abdomen on a firm surface (prone position). 2. Prop yourself up on your  elbows. 3. Use your arms to help lift your chest up until you feel a gentle stretch in your abdomen and your lower back. ? This will place some of your body weight on your elbows. If this is uncomfortable, try stacking pillows under your chest. ? Your hips should stay down, against the surface that you are lying on. Keep your hip and back muscles relaxed. 4. Hold this position for __________ seconds. 5. Slowly relax your upper body and return to the starting position. Repeat __________ times. Complete this exercise __________ times a day. Strengthening exercises These exercises build strength and endurance in your back. Endurance is the ability to use your muscles for a long time, even after they get tired. Pelvic tilt This exercise strengthens the muscles that lie deep in the abdomen. 1. Lie on your back on a firm surface. Bend your knees and keep your feet flat on the floor. 2. Tense your abdominal muscles. Tip your pelvis up toward the ceiling and flatten your lower back into the floor. ? To help with this exercise, you may place a small towel under your lower back and try to push your back into the towel. 3. Hold this position for __________ seconds. 4. Let your muscles relax completely before you repeat this exercise. Repeat __________ times. Complete this exercise __________ times a day. Alternating arm and leg raises  1. Get on your hands and knees on a firm surface. If you are on a hard floor, you   may want to use padding, such as an exercise mat, to cushion your knees. 2. Line up your arms and legs. Your hands should be directly below your shoulders, and your knees should be directly below your hips. 3. Lift your left leg behind you. At the same time, raise your right arm and straighten it in front of you. ? Do not lift your leg higher than your hip. ? Do not lift your arm higher than your shoulder. ? Keep your abdominal and back muscles tight. ? Keep your hips facing the  ground. ? Do not arch your back. ? Keep your balance carefully, and do not hold your breath. 4. Hold this position for __________ seconds. 5. Slowly return to the starting position. 6. Repeat with your right leg and your left arm. Repeat __________ times. Complete this exercise __________ times a day. Abdominal set with straight leg raise  1. Lie on your back on a firm surface. 2. Bend one of your knees and keep your other leg straight. 3. Tense your abdominal muscles and lift your straight leg up, 4-6 inches (10-15 cm) off the ground. 4. Keep your abdominal muscles tight and hold this position for __________ seconds. ? Do not hold your breath. ? Do not arch your back. Keep it flat against the ground. 5. Keep your abdominal muscles tense as you slowly lower your leg back to the starting position. 6. Repeat with your other leg. Repeat __________ times. Complete this exercise __________ times a day. Single leg lower with bent knees 1. Lie on your back on a firm surface. 2. Tense your abdominal muscles and lift your feet off the floor, one foot at a time, so your knees and hips are bent in 90-degree angles (right angles). ? Your knees should be over your hips and your lower legs should be parallel to the floor. 3. Keeping your abdominal muscles tense and your knee bent, slowly lower one of your legs so your toe touches the ground. 4. Lift your leg back up to return to the starting position. ? Do not hold your breath. ? Do not let your back arch. Keep your back flat against the ground. 5. Repeat with your other leg. Repeat __________ times. Complete this exercise __________ times a day. Posture and body mechanics Good posture and healthy body mechanics can help to relieve stress in your body's tissues and joints. Body mechanics refers to the movements and positions of your body while you do your daily activities. Posture is part of body mechanics. Good posture means:  Your spine is in its  natural S-curve position (neutral).  Your shoulders are pulled back slightly.  Your head is not tipped forward. Follow these guidelines to improve your posture and body mechanics in your everyday activities. Standing   When standing, keep your spine neutral and your feet about hip width apart. Keep a slight bend in your knees. Your ears, shoulders, and hips should line up.  When you do a task in which you stand in one place for a long time, place one foot up on a stable object that is 2-4 inches (5-10 cm) high, such as a footstool. This helps keep your spine neutral. Sitting   When sitting, keep your spine neutral and keep your feet flat on the floor. Use a footrest, if necessary, and keep your thighs parallel to the floor. Avoid rounding your shoulders, and avoid tilting your head forward.  When working at a desk or a computer, keep your desk   at a height where your hands are slightly lower than your elbows. Slide your chair under your desk so you are close enough to maintain good posture.  When working at a computer, place your monitor at a height where you are looking straight ahead and you do not have to tilt your head forward or downward to look at the screen. Resting  When lying down and resting, avoid positions that are most painful for you.  If you have pain with activities such as sitting, bending, stooping, or squatting, lie in a position in which your body does not bend very much. For example, avoid curling up on your side with your arms and knees near your chest (fetal position).  If you have pain with activities such as standing for a long time or reaching with your arms, lie with your spine in a neutral position and bend your knees slightly. Try the following positions: ? Lying on your side with a pillow between your knees. ? Lying on your back with a pillow under your knees. Lifting   When lifting objects, keep your feet at least shoulder width apart and tighten your  abdominal muscles.  Bend your knees and hips and keep your spine neutral. It is important to lift using the strength of your legs, not your back. Do not lock your knees straight out.  Always ask for help to lift heavy or awkward objects. This information is not intended to replace advice given to you by your health care provider. Make sure you discuss any questions you have with your health care provider. Document Revised: 02/03/2019 Document Reviewed: 11/03/2018 Else[employer] Patient Education  2020 [employer]  Back Exercises The following exercises strengthen the muscles that help to support the trunk and back. They also help to keep the lower back flexible. Doing these exercises can help to prevent back pain or lessen existing pain.  If you have back pain or discomfort, try doing these exercises 2-3 times each day or as told by your health care provider.  As your pain improves, do them once each day, but increase the number of times that you repeat the steps for each exercise (do more repetitions).  To prevent the recurrence of back pain, continue to do these exercises once each day or as told by your health care provider. Do exercises exactly as told by your health care provider and adjust them as directed. It is normal to feel mild stretching, pulling, tightness, or discomfort as you do these exercises, but you should stop right away if you feel sudden pain or your pain gets worse. Exercises Single knee to chest Repeat these steps 3-5 times for each leg: 1. Lie on your back on a firm bed or the floor with your legs extended. 2. Bring one knee to your chest. Your other leg should stay extended and in contact with the floor. 3. Hold your knee in place by grabbing your knee or thigh with both hands and hold. 4. Pull on your knee until you feel a gentle stretch in your lower back or buttocks. 5. Hold the stretch for 10-30 seconds. 6. Slowly release and straighten your leg. Pelvic  tilt Repeat these steps 5-10 times: 1. Lie on your back on a firm bed or the floor with your legs extended. 2. Bend your knees so they are pointing toward the ceiling and your feet are flat on the floor. 3. Tighten your lower abdominal muscles to press your lower back against the floor. This   motion will tilt your pelvis so your tailbone points up toward the ceiling instead of pointing to your feet or the floor. 4. With gentle tension and even breathing, hold this position for 5-10 seconds. Cat-cow Repeat these steps until your lower back becomes more flexible: 1. Get into a hands-and-knees position on a firm surface. Keep your hands under your shoulders, and keep your knees under your hips. You may place padding under your knees for comfort. 2. Let your head hang down toward your chest. Contract your abdominal muscles and point your tailbone toward the floor so your lower back becomes rounded like the back of a cat. 3. Hold this position for 5 seconds. 4. Slowly lift your head, let your abdominal muscles relax and point your tailbone up toward the ceiling so your back forms a sagging arch like the back of a cow. 5. Hold this position for 5 seconds.  Press-ups Repeat these steps 5-10 times: 1. Lie on your abdomen (face-down) on the floor. 2. Place your palms near your head, about shoulder-width apart. 3. Keeping your back as relaxed as possible and keeping your hips on the floor, slowly straighten your arms to raise the top half of your body and lift your shoulders. Do not use your back muscles to raise your upper torso. You may adjust the placement of your hands to make yourself more comfortable. 4. Hold this position for 5 seconds while you keep your back relaxed. 5. Slowly return to lying flat on the floor.  Bridges Repeat these steps 10 times: 1. Lie on your back on a firm surface. 2. Bend your knees so they are pointing toward the ceiling and your feet are flat on the floor. Your arms  should be flat at your sides, next to your body. 3. Tighten your buttocks muscles and lift your buttocks off the floor until your waist is at almost the same height as your knees. You should feel the muscles working in your buttocks and the back of your thighs. If you do not feel these muscles, slide your feet 1-2 inches farther away from your buttocks. 4. Hold this position for 3-5 seconds. 5. Slowly lower your hips to the starting position, and allow your buttocks muscles to relax completely. If this exercise is too easy, try doing it with your arms crossed over your chest. Abdominal crunches Repeat these steps 5-10 times: 1. Lie on your back on a firm bed or the floor with your legs extended. 2. Bend your knees so they are pointing toward the ceiling and your feet are flat on the floor. 3. Cross your arms over your chest. 4. Tip your chin slightly toward your chest without bending your neck. 5. Tighten your abdominal muscles and slowly raise your trunk (torso) high enough to lift your shoulder blades a tiny bit off the floor. Avoid raising your torso higher than that because it can put too much stress on your low back and does not help to strengthen your abdominal muscles. 6. Slowly return to your starting position. Back lifts Repeat these steps 5-10 times: 1. Lie on your abdomen (face-down) with your arms at your sides, and rest your forehead on the floor. 2. Tighten the muscles in your legs and your buttocks. 3. Slowly lift your chest off the floor while you keep your hips pressed to the floor. Keep the back of your head in line with the curve in your back. Your eyes should be looking at the floor. 4. Hold this position   for 3-5 seconds. 5. Slowly return to your starting position. Contact a health care provider if:  Your back pain or discomfort gets much worse when you do an exercise.  Your worsening back pain or discomfort does not lessen within 2 hours after you exercise. If you have  any of these problems, stop doing these exercises right away. Do not do them again unless your health care provider says that you can. Get help right away if:  You develop sudden, severe back pain. If this happens, stop doing the exercises right away. Do not do them again unless your health care provider says that you can. This information is not intended to replace advice given to you by your health care provider. Make sure you discuss any questions you have with your health care provider. Document Revised: 02/16/2019 Document Reviewed: 07/14/2018 Elsevier Patient Education  [place] injection What is this medicine? EVOLOCUMAB (e voe LOK ue mab) is known as a PCSK9 inhibitor. It is used to lower the level of cholesterol in the blood. It may be used alone or in combination with other cholesterol-lowering drugs. This drug may also be used to reduce the risk of heart attack, stroke, and certain types of heart surgery in patients with heart disease. This medicine may be used for other purposes; ask your health care provider or pharmacist if you have questions. COMMON BRAND NAME(S): Repatha What should I tell my health care provider before I take this medicine? They need to know if you have any of these conditions:  an unusual or allergic reaction to evolocumab, other medicines, latex, foods, dyes, or preservatives  pregnant or trying to get pregnant  breast-feeding How should I use this medicine? This medicine is for injection under the skin. You will be taught how to prepare and give this medicine. Use exactly as directed. Take your medicine at regular intervals. Do not take your medicine more often than directed. It is important that you put your used needles and syringes in a special sharps container. Do not put them in a trash can. If you do not have a sharps container, call your pharmacist or health care provider to get one. Talk to your pediatrician regarding the use  of this medicine in children. While this drug may be prescribed for children as young as 13 years for selected conditions, precautions do apply. Overdosage: If you think you have taken too much of this medicine contact a poison control center or emergency room at once. NOTE: This medicine is only for you. Do not share this medicine with others. What if I miss a dose? If you miss a dose, take it as soon as you can if there are more than 7 days until the next scheduled dose, or skip the missed dose and take the next dose according to your original schedule. Do not take double or extra doses. What may interact with this medicine? Interactions are not expected. This list may not describe all possible interactions. Give your health care provider a list of all the medicines, herbs, non-prescription drugs, or dietary supplements you use. Also tell them if you smoke, drink alcohol, or use illegal drugs. Some items may interact with your medicine. What should I watch for while using this medicine? Visit your health care provider for regular checks on your progress. Tell your health care provider if your symptoms do not start to get better or if they get worse. You may need blood work done while you are  taking this drug. Do not wear the on-body infuser during an MRI. What side effects may I notice from receiving this medicine? Side effects that you should report to your doctor or health care professional as soon as possible:  allergic reactions like skin rash, itching or hives, swelling of the face, lips, or tongue  signs and symptoms of high blood sugar such as dizziness; dry mouth; dry skin; fruity breath; nausea; stomach pain; increased hunger or thirst; increased urination  signs and symptoms of infection like fever or chills; cough; sore throat; pain or trouble passing urine Side effects that usually do not require medical attention (report to your doctor or health care professional if they continue  or are bothersome):  diarrhea  nausea  muscle pain  pain, redness, or irritation at site where injected This list may not describe all possible side effects. Call your doctor for medical advice about side effects. You may report side effects to FDA at 1-800-FDA-1088. Where should I keep my medicine? Keep out of the reach of children. You will be instructed on how to store this medicine. Throw away any unused medicine after the expiration date on the label. NOTE: This sheet is a summary. It may not cover all possible information. If you have questions about this medicine, talk to your doctor, pharmacist, or health care provider.  2020 Elsevier/Gold Standard (2019-08-15 16:22:29)   Semaglutide injection solution What is this medicine? SEMAGLUTIDE (Sem a GLOO tide) is used to improve blood sugar control in adults with type 2 diabetes. This medicine may be used with other diabetes medicines. This drug may also reduce the risk of heart attack or stroke if you have type 2 diabetes and risk factors for heart disease. This medicine may be used for other purposes; ask your health care provider or pharmacist if you have questions. COMMON BRAND NAME(S): OZEMPIC What should I tell my health care provider before I take this medicine? They need to know if you have any of these conditions:  endocrine tumors (MEN 2) or if someone in your family had these tumors  eye disease, vision problems  history of pancreatitis  kidney disease  stomach problems  thyroid cancer or if someone in your family had thyroid cancer  an unusual or allergic reaction to semaglutide, other medicines, foods, dyes, or preservatives  pregnant or trying to get pregnant  breast-feeding How should I use this medicine? This medicine is for injection under the skin of your upper leg (thigh), stomach area, or upper arm. It is given once every week (every 7 days). You will be taught how to prepare and give this medicine.  Use exactly as directed. Take your medicine at regular intervals. Do not take it more often than directed. If you use this medicine with insulin, you should inject this medicine and the insulin separately. Do not mix them together. Do not give the injections right next to each other. Change (rotate) injection sites with each injection. It is important that you put your used needles and syringes in a special sharps container. Do not put them in a trash can. If you do not have a sharps container, call your pharmacist or healthcare provider to get one. A special MedGuide will be given to you by the pharmacist with each prescription and refill. Be sure to read this information carefully each time. This drug comes with INSTRUCTIONS FOR USE. Ask your pharmacist for directions on how to use this drug. Read the information carefully. Talk to your pharmacist  or health care provider if you have questions. Talk to your pediatrician regarding the use of this medicine in children. Special care may be needed. Overdosage: If you think you have taken too much of this medicine contact a poison control center or emergency room at once. NOTE: This medicine is only for you. Do not share this medicine with others. What if I miss a dose? If you miss a dose, take it as soon as you can within 5 days after the missed dose. Then take your next dose at your regular weekly time. If it has been longer than 5 days after the missed dose, do not take the missed dose. Take the next dose at your regular time. Do not take double or extra doses. If you have questions about a missed dose, contact your health care provider for advice. What may interact with this medicine?  other medicines for diabetes Many medications may cause changes in blood sugar, these include:  alcohol containing beverages  antiviral medicines for HIV or AIDS  aspirin and aspirin-like drugs  certain medicines for blood pressure, heart disease, irregular heart  beat  chromium  diuretics  male hormones, such as estrogens or progestins, birth control pills  fenofibrate  gemfibrozil  isoniazid  lanreotide  male hormones or anabolic steroids  MAOIs like Carbex, Eldepryl, Marplan, Nardil, and Parnate  medicines for weight loss  medicines for allergies, asthma, cold, or cough  medicines for depression, anxiety, or psychotic disturbances  niacin  nicotine  NSAIDs, medicines for pain and inflammation, like ibuprofen or naproxen  octreotide  pasireotide  pentamidine  phenytoin  probenecid  quinolone antibiotics such as ciprofloxacin, levofloxacin, ofloxacin  some herbal dietary supplements  steroid medicines such as prednisone or cortisone  sulfamethoxazole; trimethoprim  thyroid hormones Some medications can hide the warning symptoms of low blood sugar (hypoglycemia). You may need to monitor your blood sugar more closely if you are taking one of these medications. These include:  beta-blockers, often used for high blood pressure or heart problems (examples include atenolol, metoprolol, propranolol)  clonidine  guanethidine  reserpine This list may not describe all possible interactions. Give your health care provider a list of all the medicines, herbs, non-prescription drugs, or dietary supplements you use. Also tell them if you smoke, drink alcohol, or use illegal drugs. Some items may interact with your medicine. What should I watch for while using this medicine? Visit your doctor or health care professional for regular checks on your progress. Drink plenty of fluids while taking this medicine. Check with your doctor or health care professional if you get an attack of severe diarrhea, nausea, and vomiting. The loss of too much body fluid can make it dangerous for you to take this medicine. A test called the HbA1C (A1C) will be monitored. This is a simple blood test. It measures your blood sugar control over the  last 2 to 3 months. You will receive this test every 3 to 6 months. Learn how to check your blood sugar. Learn the symptoms of low and high blood sugar and how to manage them. Always carry a quick-source of sugar with you in case you have symptoms of low blood sugar. Examples include hard sugar candy or glucose tablets. Make sure others know that you can choke if you eat or drink when you develop serious symptoms of low blood sugar, such as seizures or unconsciousness. They must get medical help at once. Tell your doctor or health care professional if you have high  blood sugar. You might need to change the dose of your medicine. If you are sick or exercising more than usual, you might need to change the dose of your medicine. Do not skip meals. Ask your doctor or health care professional if you should avoid alcohol. Many nonprescription cough and cold products contain sugar or alcohol. These can affect blood sugar. Pens should never be shared. Even if the needle is changed, sharing may result in passing of viruses like hepatitis or HIV. Wear a medical ID bracelet or chain, and carry a card that describes your disease and details of your medicine and dosage times. Do not become pregnant while taking this medicine. Women should inform their doctor if they wish to become pregnant or think they might be pregnant. There is a potential for serious side effects to an unborn child. Talk to your health care professional or pharmacist for more information. What side effects may I notice from receiving this medicine? Side effects that you should report to your doctor or health care professional as soon as possible:  allergic reactions like skin rash, itching or hives, swelling of the face, lips, or tongue  breathing problems  changes in vision  diarrhea that continues or is severe  lump or swelling on the neck  severe nausea  signs and symptoms of infection like fever or chills; cough; sore throat; pain  or trouble passing urine  signs and symptoms of low blood sugar such as feeling anxious, confusion, dizziness, increased hunger, unusually weak or tired, sweating, shakiness, cold, irritable, headache, blurred vision, fast heartbeat, loss of consciousness  signs and symptoms of kidney injury like trouble passing urine or change in the amount of urine  trouble swallowing  unusual stomach upset or pain  vomiting Side effects that usually do not require medical attention (report to your doctor or health care professional if they continue or are bothersome):  constipation  diarrhea  nausea  pain, redness, or irritation at site where injected  stomach upset This list may not describe all possible side effects. Call your doctor for medical advice about side effects. You may report side effects to FDA at 1-800-FDA-1088. Where should I keep my medicine? Keep out of the reach of children. Store unopened pens in a refrigerator between 2 and 8 degrees C (36 and 46 degrees F). Do not freeze. Protect from light and heat. After you first use the pen, it can be stored for 56 days at room temperature between 15 and 30 degrees C (59 and 86 degrees F) or in a refrigerator. Throw away your used pen after 56 days or after the expiration date, whichever comes first. Do not store your pen with the needle attached. If the needle is left on, medicine may leak from the pen. NOTE: This sheet is a summary. It may not cover all possible information. If you have questions about this medicine, talk to your doctor, pharmacist, or health care provider.  2020 Elsevier/Gold Standard (2019-06-27 09:41:51)   Dulaglutide injection What is this medicine? DULAGLUTIDE (DOO la GLOO tide) is used to improve blood sugar control in adults with type 2 diabetes. This medicine may be used with other oral diabetes medicines. This drug may also reduce the risk of heart attack or stroke if you have type 2 diabetes and risk factors  for heart disease. This medicine may be used for other purposes; ask your health care provider or pharmacist if you have questions. COMMON BRAND NAME(S): Trulicity What should I tell my  health care provider before I take this medicine? They need to know if you have any of these conditions:  endocrine tumors (MEN 2) or if someone in your family had these tumors  eye disease, vision problems  history of pancreatitis  kidney disease  liver disease  stomach problems  thyroid cancer or if someone in your family had thyroid cancer  an unusual or allergic reaction to dulaglutide, other medicines, foods, dyes, or preservatives  pregnant or trying to get pregnant  breast-feeding How should I use this medicine? This medicine is for injection under the skin of your upper leg (thigh), stomach area, or upper arm. It is usually given once every week (every 7 days). You will be taught how to prepare and give this medicine. Use exactly as directed. Take your medicine at regular intervals. Do not take it more often than directed. If you use this medicine with insulin, you should inject this medicine and the insulin separately. Do not mix them together. Do not give the injections right next to each other. Change (rotate) injection sites with each injection. It is important that you put your used needles and syringes in a special sharps container. Do not put them in a trash can. If you do not have a sharps container, call your pharmacist or healthcare provider to get one. A special MedGuide will be given to you by the pharmacist with each prescription and refill. Be sure to read this information carefully each time. This drug comes with INSTRUCTIONS FOR USE. Ask your pharmacist for directions on how to use this drug. Read the information carefully. Talk to your pharmacist or health care provider if you have questions. Talk to your pediatrician regarding the use of this medicine in children. Special care  may be needed. Overdosage: If you think you have taken too much of this medicine contact a poison control center or emergency room at once. NOTE: This medicine is only for you. Do not share this medicine with others. What if I miss a dose? If you miss a dose, take it as soon as you can within 3 days after the missed dose. Then take your next dose at your regular weekly time. If it has been longer than 3 days after the missed dose, do not take the missed dose. Take the next dose at your regular time. Do not take double or extra doses. If you have questions about a missed dose, contact your health care provider for advice. What may interact with this medicine?  other medicines for diabetes Many medications may cause changes in blood sugar, these include:  alcohol containing beverages  antiviral medicines for HIV or AIDS  aspirin and aspirin-like drugs  certain medicines for blood pressure, heart disease, irregular heart beat  chromium  diuretics  male hormones, such as estrogens or progestins, birth control pills  fenofibrate  gemfibrozil  isoniazid  lanreotide  male hormones or anabolic steroids  MAOIs like Carbex, Eldepryl, Marplan, Nardil, and Parnate  medicines for weight loss  medicines for allergies, asthma, cold, or cough  medicines for depression, anxiety, or psychotic disturbances  niacin  nicotine  NSAIDs, medicines for pain and inflammation, like ibuprofen or naproxen  octreotide  pasireotide  pentamidine  phenytoin  probenecid  quinolone antibiotics such as ciprofloxacin, levofloxacin, ofloxacin  some herbal dietary supplements  steroid medicines such as prednisone or cortisone  sulfamethoxazole; trimethoprim  thyroid hormones Some medications can hide the warning symptoms of low blood sugar (hypoglycemia). You may need to monitor  your blood sugar more closely if you are taking one of these medications. These include:  beta-blockers,  often used for high blood pressure or heart problems (examples include atenolol, metoprolol, propranolol)  clonidine  guanethidine  reserpine This list may not describe all possible interactions. Give your health care provider a list of all the medicines, herbs, non-prescription drugs, or dietary supplements you use. Also tell them if you smoke, drink alcohol, or use illegal drugs. Some items may interact with your medicine. What should I watch for while using this medicine? Visit your doctor or health care professional for regular checks on your progress. Drink plenty of fluids while taking this medicine. Check with your doctor or health care professional if you get an attack of severe diarrhea, nausea, and vomiting. The loss of too much body fluid can make it dangerous for you to take this medicine. A test called the HbA1C (A1C) will be monitored. This is a simple blood test. It measures your blood sugar control over the last 2 to 3 months. You will receive this test every 3 to 6 months. Learn how to check your blood sugar. Learn the symptoms of low and high blood sugar and how to manage them. Always carry a quick-source of sugar with you in case you have symptoms of low blood sugar. Examples include hard sugar candy or glucose tablets. Make sure others know that you can choke if you eat or drink when you develop serious symptoms of low blood sugar, such as seizures or unconsciousness. They must get medical help at once. Tell your doctor or health care professional if you have high blood sugar. You might need to change the dose of your medicine. If you are sick or exercising more than usual, you might need to change the dose of your medicine. Do not skip meals. Ask your doctor or health care professional if you should avoid alcohol. Many nonprescription cough and cold products contain sugar or alcohol. These can affect blood sugar. Pens should never be shared. Even if the needle is changed, sharing  may result in passing of viruses like hepatitis or HIV. Wear a medical ID bracelet or chain, and carry a card that describes your disease and details of your medicine and dosage times. What side effects may I notice from receiving this medicine? Side effects that you should report to your doctor or health care professional as soon as possible:  allergic reactions like skin rash, itching or hives, swelling of the face, lips, or tongue  breathing problems  changes in vision  diarrhea that continues or is severe  lump or swelling on the neck  severe nausea  signs and symptoms of infection like fever or chills; cough; sore throat; pain or trouble passing urine  signs and symptoms of low blood sugar such as feeling anxious, confusion, dizziness, increased hunger, unusually weak or tired, sweating, shakiness, cold, irritable, headache, blurred vision, fast heartbeat, loss of consciousness  signs and symptoms of kidney injury like trouble passing urine or change in the amount of urine  trouble swallowing  unusual stomach upset or pain  vomiting Side effects that usually do not require medical attention (report to your doctor or health care professional if they continue or are bothersome):  diarrhea  loss of appetite  nausea  pain, redness, or irritation at site where injected  stomach upset This list may not describe all possible side effects. Call your doctor for medical advice about side effects. You may report side effects to  FDA at 1-800-FDA-1088. Where should I keep my medicine? Keep out of the reach of children. Store unopened pens in a refrigerator between 2 and 8 degrees C (36 and 46 degrees F). Do not freeze or use if the medicine has been frozen. Protect from light and excessive heat. Store in the carton until use. Each single-dose pen can be kept at room temperature, not to exceed 30 degrees C (86 degrees F) for a total of 14 days, if needed. Throw away any unused  medicine after the expiration date on the label. NOTE: This sheet is a summary. It may not cover all possible information. If you have questions about this medicine, talk to your doctor, pharmacist, or health care provider.  2020 Elsevier/Gold Standard (2019-06-27 09:34:53)

## 2019-12-19 NOTE — Progress Notes (Signed)
Virtual Visit via Video Note  I connected with Jackson [name]  on 12/19/19 at  3:20 PM EST by a video enabled telemedicine application and verified that I am speaking with the correct person using two identifiers.  Location patient: home Location provider:work or home office Persons participating in the virtual visit: patient, provider  I discussed the limitations of evaluation and management by telemedicine and the availability of in person appointments. The patient expressed understanding and agreed to proceed.   HPI: 1. HLD/CAD declines statin did not tolerate in the past and also zetia did not work in the past wants to disc repatha with cards  2. C/o lateral mid back pain and h/o low back pain with L4/5 fusion surgery and h/o herniated disc pain resolved for now he thought was kidney pain 2/2 januvia and stopped this medication will consider Xray T spine and MRI lumbar back in the future. Denies h/o kidney stones  3. Cough improved taking mucinex dm otc and helping covid 19 test negative  4. DM 2 A1C 7.4 stopped [place] due to #2 on metformin 1000 mg bid  5. Elevated PSa needs to f/u with urology his brother also had elevated PSA to 8 and is 67 y.o but It normalized to 4 or less w/o treatment   ROS: See pertinent positives and negatives per HPI.  Past Medical History:  Diagnosis Date  . Allergy   . Anemia   . Arthritis   . CAD (coronary artery disease)   . Cancer (HCC)    BASAL CELL-FACE  . Complication of anesthesia    PT IS A RED HEAD AND STATES IT TAKES ALOT MORE ANESTHESIA TO SEDATE HIM  . Diabetes mellitus without complication ([place])   . Dysrhythmia    SVT  . Fatty liver   . GERD (gastroesophageal reflux disease)   . Hyperlipidemia   . Multiple thyroid nodules   . Rheumatic fever   . Sleep apnea 1997   Uses C-Pap machine  . SVT (supraventricular tachycardia) ([place])   . Thyroid nodule     Past Surgical History:  Procedure Laterality Date  . APPENDECTOMY  1965  .  BICEPS TENDON REPAIR Right 05/2015  . COLONOSCOPY  2016  . LUMBAR FUSION  2015   L4/L5  . RHINOPLASTY  1997  . TONSILLECTOMY  1956  . VASECTOMY  1981    Family History  Problem Relation Age of Onset  . Arthritis Mother   . Lung cancer Mother   . Hyperlipidemia Mother   . Diabetes Mother   . Arthritis Father   . Diabetes Father   . Lung disease Father   . Hyperlipidemia Maternal Grandmother   . Alcohol abuse Maternal Grandfather   . Hyperlipidemia Maternal Grandfather   . Drug abuse Cousin   . Hyperlipidemia Other        maternal side of family     SOCIAL HX:   Married  Former Nature conservation officer  2 daughters   Current Outpatient Medications:  .  azelastine (ASTELIN) 0.1 % nasal spray, Place 1 spray into both nostrils 2 (two) times daily. Use in each nostril as directed, Disp: 90 mL, Rfl: 3 .  cetirizine (ZYRTEC) 10 MG tablet, Take 10 mg by mouth at bedtime. , Disp: , Rfl:  .  cholecalciferol (VITAMIN D3) 25 MCG (1000 UT) tablet, Take 1,000 Units by mouth daily., Disp: , Rfl:  .  Coenzyme Q10 (COQ10) 200 MG CAPS, Take 200 mg by mouth daily. , Disp: , Rfl:  .  ketoconazole (NIZORAL) 2 % shampoo, , Disp: , Rfl:  .  metFORMIN (GLUCOPHAGE) 1000 MG tablet, Take 1 tablet (1,000 mg total) by mouth 2 (two) times daily with a meal. ., Disp: 180 tablet, Rfl: 3 .  metoprolol succinate (TOPROL-XL) 25 MG 24 hr tablet, Take 1 tablet (25 mg total) by mouth daily. Take with or immediately following a meal. (Patient taking differently: Take 50 mg by mouth daily. Take with or immediately following a meal.), Disp: 90 tablet, Rfl: 3 .  Multiple Vitamins-Minerals (MULTIVITAMIN ADULT PO), Take 1 tablet by mouth daily. , Disp: , Rfl:  .  Omega-3 Fatty Acids (FISH OIL) 1200 MG CPDR, Take 1 tablet by mouth daily., Disp: , Rfl:  .  omeprazole (PRILOSEC) 40 MG capsule, Take 1 capsule (40 mg total) by mouth daily., Disp: 90 capsule, Rfl: 3 .  OVER THE COUNTER MEDICATION, Take 1 tablet by mouth daily. Osteo  biflex , Disp: , Rfl:  .  vitamin C (ASCORBIC ACID) 500 MG tablet, Take 1,000 mg by mouth daily. , Disp: , Rfl:  .  vitamin E 100 UNIT capsule, Take 400 Units by mouth daily. , Disp: , Rfl:  .  Dulaglutide (TRULICITY) 1.61 WR/6.0AV SOPN, Inject 0.75 mg into the skin once a week., Disp: 3 pen, Rfl: 2 .  Insulin Pen Needle (PEN NEEDLES) 30G X 8 MM MISC, 1 Device by Does not apply route once a week., Disp: 30 each, Rfl: 11  EXAM:  VITALS per patient if applicable:  GENERAL: alert, oriented, appears well and in no acute distress  HEENT: atraumatic, conjunttiva clear, no obvious abnormalities on inspection of external nose and ears  NECK: normal movements of the head and neck  LUNGS: on inspection no signs of respiratory distress, breathing rate appears normal, no obvious gross SOB, gasping or wheezing  CV: no obvious cyanosis  MS: moves all visible extremities without noticeable abnormality  PSYCH/NEURO: pleasant and cooperative, no obvious depression or anxiety, speech and thought processing grossly intact  ASSESSMENT AND PLAN:  Discussed the following assessment and plan:  Type 2 diabetes mellitus with hyperglycemia, without long-term current use of insulin (HCC) - Plan: Dulaglutide (TRULICITY) 4.09 WJ/1.9JY SOPN, Insulin Pen Needle (PEN NEEDLES) 30G X 8 MM MISC Cont metformin  Stop [place] pt though caused kidney pain   Hyperlipidemia, unspecified hyperlipidemia type - Plan: Ambulatory referral to Cardiology Coronary artery disease involving native coronary artery of native heart without angina pectoris - Plan: Ambulatory referral to Cardiology disc repatha   Mid back pain Chronic low back pain, unspecified back pain laterality, unspecified whether sciatica present -consider Xray mid back and MRI L spine h/o fusion L4/5 -given back exercises   Cough Improving   Elevated PSA Pt will call urology to f/u  Consider Xray KUB r/o kidney stones as source of back pain in future    HM Reviewed labs2/2021  Flu shot utd Tdap utd Had prevnar will need pna 23 in 1 year3/2020do at f/uin person  Consider covid 19 vaccine if not had   Consider shingrix in futuregiven Rx prev and he has  twinrix3/3immune MMR immune PSA had 06/20/18 normal 2.92PSA 3.75 12/30/15, 3.60 09/23/15 Zoster had 03/13/15  hep C neg PSA4.93 06/01/19  2.92 06/20/18 f/u urology Dr. Bernardo Heater PSA stable -needs to f/u urology   DermDr. Laurence Ferrari LN2 arms, scalp due 08/2019 with h/o melanoma   Colonoscopy 10/17/15 normal fair prep consider repeat in 5 years   CT chest had 04/07/19  Sleep study 09/14/18 sleep  med mild OSA total AHI 10 per hr, cpap titration 9 cm h20 but turned down to 8  -rec wt loss , nasal congestion, CPAP at 8 cm h20 he also had high periodic limb movement index with only few arousals ?RLS +/- insomnia   -CPAP 8 cm H20 humidified, [place] Full face mask large measures to maintain airway address nasal congestion  -we discussed possible serious and likely etiologies, options for evaluation and workup, limitations of telemedicine visit vs in person visit, treatment, treatment risks and precautions. Pt prefers to treat via telemedicine empirically rather then risking or undertaking an in person visit at this moment. Patient agrees to seek prompt in person care if worsening, new symptoms arise, or if is not improving with treatment.   I discussed the assessment and treatment plan with the patient. The patient was provided an opportunity to ask questions and all were answered. The patient agreed with the plan and demonstrated an understanding of the instructions.   The patient was advised to call back or seek an in-person evaluation if the symptoms worsen or if the condition fails to improve as anticipated.  Time spent 20-29 minutes  Delorise Jackson, MD

## 2019-12-21 ENCOUNTER — Encounter: Payer: Self-pay | Admitting: Internal Medicine

## 2019-12-21 NOTE — Progress Notes (Signed)
Routed message to scheduling for appointment.

## 2019-12-25 NOTE — Progress Notes (Unsigned)
Pt called office to schedule lab appt for PSA

## 2019-12-26 ENCOUNTER — Other Ambulatory Visit: Payer: Medicare Other

## 2019-12-26 ENCOUNTER — Other Ambulatory Visit: Payer: Self-pay

## 2019-12-26 DIAGNOSIS — R972 Elevated prostate specific antigen [PSA]: Secondary | ICD-10-CM

## 2019-12-27 ENCOUNTER — Telehealth: Payer: Self-pay

## 2019-12-27 DIAGNOSIS — R972 Elevated prostate specific antigen [PSA]: Secondary | ICD-10-CM

## 2019-12-27 LAB — PSA: Prostate Specific Ag, Serum: 3.5 ng/mL (ref 0.0–4.0)

## 2019-12-27 NOTE — Telephone Encounter (Signed)
-----   Message from Billey Co, MD sent at 12/27/2019  9:51 AM EST ----- Doristine Devoid news, PSA back to normal at 3.5, recommend rtc one year with PSA reflex to free prior, thanks  Nickolas Madrid, MD 12/27/2019

## 2019-12-27 NOTE — Telephone Encounter (Signed)
Called pt informed him of the information below. Pt gave verbal understanding. PSA ordered. Lab appt scheduled. Follow up scheduled.

## 2019-12-28 DIAGNOSIS — L821 Other seborrheic keratosis: Secondary | ICD-10-CM | POA: Diagnosis not present

## 2019-12-28 DIAGNOSIS — L820 Inflamed seborrheic keratosis: Secondary | ICD-10-CM | POA: Diagnosis not present

## 2019-12-28 DIAGNOSIS — L570 Actinic keratosis: Secondary | ICD-10-CM | POA: Diagnosis not present

## 2020-01-01 NOTE — Progress Notes (Signed)
Cardiology Office Note  Date:  01/02/2020   ID:  Jackson [name], DOB 04/23/1951, MRN [id number]  PCP:  [name], [name], MD   Chief Complaint  Patient presents with  . office visit    Discuss taking Repatha; Meds verbally reviewed with patient.    HPI:  [name] is a 69 year old gentleman with past medical history of Diabetes Hyperlipidemia SVT Who presents for f/u of his  hyperlipidemia, carotid risk factors, hyperlipidemia  Reports he has been doing relatively well Systolic pressures [id number]  at home  Tried metoprolol 25, SVT came back Rates 155 Went back on metoprolol 50 daily On the higher dose denies significant arrhythmia  Long discussion concerning his hyperlipidemia Dietary indiscretion, no regular exercise program Total cholesterol 196 LDL 135  CT scan chest images pulled up and reviewed, no aortic atherosclerosis, no coronary calcification  He does not really want to take Zetia, Reports having intolerance to statins in the past  EKG personally reviewed by myself on todays visit Shows normal sinus rhythm rate 65 bpm  T wav inversion V1 to V3  Past medical history reviewed history of SVT  last in 2010, several episodes Several episodes while riding a bike Rate 195, would not break, he broke on its own after 1 or 2 hours started on metoprolol 100 Has not had further episodes since that time, recently weaned down to metoprolol 25 Denies any breakthrough  lipitor for 8-9 years Joint and muscle weakness Felt zetia does not work   PMH:   has a past medical history of Allergy, Anemia, Arthritis, CAD (coronary artery disease), Cancer ([place]), Complication of anesthesia, Diabetes mellitus without complication ([place]), Dysrhythmia, Fatty liver, GERD (gastroesophageal reflux disease), Hyperlipidemia, Multiple thyroid nodules, Rheumatic fever, Sleep apnea (1997), SVT (supraventricular tachycardia) ([place]), and Thyroid nodule.  PSH:    Past Surgical  History:  Procedure Laterality Date  . APPENDECTOMY  1965  . BICEPS TENDON REPAIR Right 05/2015  . COLONOSCOPY  2016  . LUMBAR FUSION  2015   L4/L5  . RHINOPLASTY  1997  . TONSILLECTOMY  1956  . VASECTOMY  1981    Current Outpatient Medications  Medication Sig Dispense Refill  . azelastine (ASTELIN) 0.1 % nasal spray Place 1 spray into both nostrils 2 (two) times daily. Use in each nostril as directed 90 mL 3  . cetirizine (ZYRTEC) 10 MG tablet Take 10 mg by mouth at bedtime.     . cholecalciferol (VITAMIN D3) 25 MCG (1000 UT) tablet Take 1,000 Units by mouth daily.    . Coenzyme Q10 (COQ10) 200 MG CAPS Take 200 mg by mouth daily.     . Dulaglutide (TRULICITY) A999333 0000000 SOPN Inject 0.75 mg into the skin once a week. 3 pen 2  . Insulin Pen Needle (PEN NEEDLES) 30G X 8 MM MISC 1 Device by Does not apply route once a week. 30 each 11  . ketoconazole (NIZORAL) 2 % shampoo     . metFORMIN (GLUCOPHAGE) 1000 MG tablet Take 1 tablet (1,000 mg total) by mouth 2 (two) times daily with a meal. . 180 tablet 3  . metoprolol succinate (TOPROL-XL) 25 MG 24 hr tablet Take 1 tablet (25 mg total) by mouth daily. Take with or immediately following a meal. (Patient taking differently: Take 50 mg by mouth daily. Take with or immediately following a meal.) 90 tablet 3  . Multiple Vitamins-Minerals (MULTIVITAMIN ADULT PO) Take 1 tablet by mouth daily.     . Omega-3 Fatty Acids (FISH  OIL) 1200 MG CPDR Take 1 tablet by mouth daily.    Marland Kitchen omeprazole (PRILOSEC) 40 MG capsule Take 1 capsule (40 mg total) by mouth daily. 90 capsule 3  . OVER THE COUNTER MEDICATION Take 1 tablet by mouth daily. Osteo biflex     . vitamin C (ASCORBIC ACID) 500 MG tablet Take 1,000 mg by mouth daily.     . vitamin E 100 UNIT capsule Take 400 Units by mouth daily.      No current facility-administered medications for this visit.     Allergies:   Januvia [sitagliptin], Lipitor [atorvastatin], and Statins   Social History:  The  patient  reports that he has never smoked. He has quit using smokeless tobacco.  His smokeless tobacco use included snuff. He reports current alcohol use of about 1.0 - 2.0 standard drinks of alcohol per week. He reports that he does not use drugs.   Family History:   family history includes Alcohol abuse in his maternal grandfather; Arthritis in his father and mother; Diabetes in his father and mother; Drug abuse in his cousin; Hyperlipidemia in his maternal grandfather, maternal grandmother, mother, and another family member; Lung cancer in his mother; Lung disease in his father.    Review of Systems: Review of Systems  Constitutional: Negative.   Respiratory: Negative.   Cardiovascular: Negative.   Gastrointestinal: Negative.   Musculoskeletal: Negative.   Neurological: Negative.   Psychiatric/Behavioral: Negative.   All other systems reviewed and are negative.    PHYSICAL EXAM: VS:  BP (!) 140/100 (BP Location: Left Arm, Patient Position: Sitting, Cuff Size: Normal)   Pulse 65   Ht 6\' 1"  (1.854 m)   Wt 235 lb (106.6 kg)   SpO2 96%   BMI 31.00 kg/m  , BMI Body mass index is 31 kg/m. GEN: Well nourished, well developed, in no acute distress  HEENT: normal  Neck: no JVD, carotid bruits, or masses Cardiac: RRR; no murmurs, rubs, or gallops,no edema  Respiratory:  clear to auscultation bilaterally, normal work of breathing GI: soft, nontender, nondistended, + BS MS: no deformity or atrophy  Skin: warm and dry, no rash Neuro:  Strength and sensation are intact Psych: euthymic mood, full affect  Recent Labs: 12/12/2019: ALT 43; BUN 19; Creatinine, Ser 1.23; Hemoglobin 15.4; Platelets 214.0; Potassium 4.3; Sodium 137    Lipid Panel Lab Results  Component Value Date   CHOL 196 12/12/2019   HDL 29.70 (L) 12/12/2019   LDLCALC 135 (H) 12/21/2018   TRIG 205.0 (H) 12/12/2019      Wt Readings from Last 3 Encounters:  01/02/20 235 lb (106.6 kg)  12/19/19 230 lb (104.3 kg)   12/05/19 230 lb (104.3 kg)     ASSESSMENT AND PLAN:  Paroxysmal SVT (supraventricular tachycardia) ([place]) - Plan: Recommend he stay on the metoprolol 50 mg daily Rare recurrence on the higher dose of metoprolol He does report family history, daughter with similar arrhythmia  Type 2 diabetes mellitus without complication, without long-term current use of insulin ([place]) -  We have encouraged continued exercise, careful diet management in an effort to lose weight.  Mixed hyperlipidemia Not a candidate for PCSK9 inhibitor as he has no coronary artery disease documented and no PAD CT  images pulled up in confirming this He is not particularly interested in a statin Does not really feel that the Zetia is working Suggested he try bempidoic acid/Nexletol This can also be combined with Zetia for a bigger drop in numbers if he chooses  We will see if this will be preauthorized through his insurance   Disposition:   F/U as needed   Total encounter time more than 25 minutes  Greater than 50% was spent in counseling and coordination of care with the patient    Orders Placed This Encounter  Procedures  . EKG 12-Lead     Signed, Esmond Plants, M.D., Ph.D. 01/02/2020  [place], [place]

## 2020-01-02 ENCOUNTER — Other Ambulatory Visit: Payer: Self-pay

## 2020-01-02 ENCOUNTER — Encounter: Payer: Self-pay | Admitting: Cardiovascular Disease

## 2020-01-02 ENCOUNTER — Ambulatory Visit (INDEPENDENT_AMBULATORY_CARE_PROVIDER_SITE_OTHER): Payer: Medicare Other | Admitting: Cardiovascular Disease

## 2020-01-02 VITALS — BP 140/100 | HR 65 | Ht 73.0 in | Wt 235.0 lb

## 2020-01-02 DIAGNOSIS — I2510 Atherosclerotic heart disease of native coronary artery without angina pectoris: Secondary | ICD-10-CM

## 2020-01-02 DIAGNOSIS — E782 Mixed hyperlipidemia: Secondary | ICD-10-CM

## 2020-01-02 DIAGNOSIS — I471 Supraventricular tachycardia: Secondary | ICD-10-CM

## 2020-01-02 DIAGNOSIS — E119 Type 2 diabetes mellitus without complications: Secondary | ICD-10-CM

## 2020-01-02 MED ORDER — NEXLETOL 180 MG PO TABS: 180.0000 mg | ORAL_TABLET | Freq: Every day | ORAL | 6 refills | Status: DC

## 2020-01-02 NOTE — Patient Instructions (Addendum)
Medication Instructions:  Try Nexletol 180 mg once a day  Cholesterol check 2-3 months  We might be able to try Nexlizet (combo pill)  If you need a refill on your cardiac medications before your next appointment, please call your pharmacy.    Lab work: No new labs needed   If you have labs (blood work) drawn today and your tests are completely normal, you will receive your results only by: Marland Kitchen MyChart Message (if you have MyChart) OR . A paper copy in the mail If you have any lab test that is abnormal or we need to change your treatment, we will call you to review the results.   Testing/Procedures: No new testing needed   Follow-Up: At [facility], you and your health needs are our priority.  As part of our continuing mission to provide you with exceptional heart care, we have created designated Provider Care Teams.  These Care Teams include your primary Cardiologist (physician) and Advanced Practice Providers (APPs -  Physician Assistants and Nurse Practitioners) who all work together to provide you with the care you need, when you need it.  . You will need a follow up appointment in 12 months   . Providers on your designated Care Team:   . Jackson Hodgkins, NP . Jackson Faith, PA-C . Jackson Mood, PA-C  Any Other Special Instructions Will Be Listed Below (If Applicable).  For educational health videos Log in to : [email or web address] Or : [email or web address], password : [email or web address]  COVID-19 Vaccine Information can be found at: ShippingScam.co.uk For questions related to vaccine distribution or appointments, please email vaccine@ .com or call [phone].

## 2020-01-19 ENCOUNTER — Encounter: Payer: Self-pay | Admitting: Internal Medicine

## 2020-01-19 ENCOUNTER — Other Ambulatory Visit: Payer: Self-pay | Admitting: Internal Medicine

## 2020-01-19 DIAGNOSIS — I471 Supraventricular tachycardia: Secondary | ICD-10-CM

## 2020-01-23 ENCOUNTER — Encounter: Payer: Self-pay | Admitting: Internal Medicine

## 2020-01-23 ENCOUNTER — Other Ambulatory Visit: Payer: Self-pay | Admitting: Internal Medicine

## 2020-01-23 ENCOUNTER — Telehealth: Payer: Self-pay | Admitting: Internal Medicine

## 2020-01-23 DIAGNOSIS — I471 Supraventricular tachycardia: Secondary | ICD-10-CM

## 2020-01-23 MED ORDER — METOPROLOL SUCCINATE ER 50 MG PO TB24: 50.0000 mg | ORAL_TABLET | Freq: Every day | ORAL | 3 refills | Status: DC

## 2020-01-23 NOTE — Telephone Encounter (Signed)
Hi Jackson [name]  Are you taking metoprolol 25 xl or 50 mg xl daily?  [place]

## 2020-01-23 NOTE — Telephone Encounter (Signed)
See prior message   [place]

## 2020-01-23 NOTE — Telephone Encounter (Signed)
Left message to return call 

## 2020-01-23 NOTE — Telephone Encounter (Signed)
Patient is currently taking 50 XL.

## 2020-02-27 ENCOUNTER — Encounter: Payer: Self-pay | Admitting: Dermatology

## 2020-02-27 ENCOUNTER — Other Ambulatory Visit: Payer: Self-pay

## 2020-02-27 ENCOUNTER — Ambulatory Visit (INDEPENDENT_AMBULATORY_CARE_PROVIDER_SITE_OTHER): Payer: Medicare Other | Admitting: Dermatology

## 2020-02-27 DIAGNOSIS — L820 Inflamed seborrheic keratosis: Secondary | ICD-10-CM | POA: Diagnosis not present

## 2020-02-27 DIAGNOSIS — Z86006 Personal history of melanoma in-situ: Secondary | ICD-10-CM

## 2020-02-27 DIAGNOSIS — Z85828 Personal history of other malignant neoplasm of skin: Secondary | ICD-10-CM | POA: Diagnosis not present

## 2020-02-27 DIAGNOSIS — L570 Actinic keratosis: Secondary | ICD-10-CM

## 2020-02-27 DIAGNOSIS — L578 Other skin changes due to chronic exposure to nonionizing radiation: Secondary | ICD-10-CM | POA: Diagnosis not present

## 2020-02-27 DIAGNOSIS — Z85820 Personal history of malignant melanoma of skin: Secondary | ICD-10-CM

## 2020-02-27 NOTE — Progress Notes (Signed)
   Follow-Up Visit   Subjective  Jackson [name] is a 69 y.o. male who presents for the following: spots (left lower neck, left ear, right forehead). Some are new or changing, some are irritated.  He has a history of malignant melanoma in situ of the left forearm that was excised 02/22/2019 and BCC of the forehead that was treated in the past. Patient will schedule for TBSE with Dr Laurence Ferrari in a few months.  The following portions of the chart were reviewed this encounter and updated as appropriate:      Review of Systems:  No other skin or systemic complaints except as noted in HPI or Assessment and Plan.  Objective  Well appearing patient in no apparent distress; mood and affect are within normal limits.  A focused examination was performed including face, neck, ears. Relevant physical exam findings are noted in the Assessment and Plan.  Objective  Left Mid Ear Helix x 1, L sideburn x 1, L zygoma x 1, L cheek x 1, R lat forehead x 5 (9): Erythematous thin papules/macules with gritty scale.   Objective  Forehead: Well healed scar with no evidence of recurrence.   Objective  Left Forearm: Well healed scar with no evidence of recurrence.  Objective  Left Forearm x 1, Left lower neck x 1 (2): Erythematous keratotic or waxy stuck-on papule.    Assessment & Plan  AK (actinic keratosis) (9) Left Mid Ear Helix x 1, L sideburn x 1, L zygoma x 1, L cheek x 1, R lat forehead x 5  Destruction of lesion - Left Mid Ear Helix x 1, L sideburn x 1, L zygoma x 1, L cheek x 1, R lat forehead x 5  Destruction method: cryotherapy   Informed consent: discussed and consent obtained   Lesion destroyed using liquid nitrogen: Yes   Region frozen until ice ball extended beyond lesion: Yes   Outcome: patient tolerated procedure well with no complications   Post-procedure details: wound care instructions given    History of basal cell carcinoma (BCC) Forehead  Clear. Observe for recurrence. Call  clinic for new or changing lesions.  Recommend regular skin exams, daily broad-spectrum spf 30+ sunscreen use, and photoprotection.     History of melanoma Left Forearm  In situ  Clear. Observe for recurrence. Call clinic for new or changing lesions.  Recommend regular skin exams, daily broad-spectrum spf 30+ sunscreen use, and photoprotection.     Inflamed seborrheic keratosis (2) Left Forearm x 1, Left lower neck x 1  Destruction of lesion - Left Forearm x 1, Left lower neck x 1  Destruction method: cryotherapy   Informed consent: discussed and consent obtained   Lesion destroyed using liquid nitrogen: Yes   Region frozen until ice ball extended beyond lesion: Yes   Outcome: patient tolerated procedure well with no complications   Post-procedure details: wound care instructions given    Actinic Damage - diffuse scaly erythematous macules with underlying dyspigmentation - Recommend daily broad spectrum sunscreen SPF 30+ to sun-exposed areas, reapply every 2 hours as needed.  - Call for new or changing lesions.   Return in about 3 months (around 05/29/2020) for TBSE with Dr Laurence Ferrari.   IJamesetta Orleans, CMA, am acting as scribe for Brendolyn Patty, MD .    Documentation: I have reviewed the above documentation for accuracy and completeness, and I agree with the above.  Brendolyn Patty, MD

## 2020-02-27 NOTE — Patient Instructions (Signed)
Cryotherapy Aftercare  . Wash gently with soap and water everyday.   . Apply Vaseline and Band-Aid daily until healed.  

## 2020-02-29 ENCOUNTER — Encounter: Payer: Medicare Other | Admitting: Dermatology

## 2020-03-04 ENCOUNTER — Encounter: Payer: Self-pay | Admitting: Internal Medicine

## 2020-03-19 ENCOUNTER — Ambulatory Visit (INDEPENDENT_AMBULATORY_CARE_PROVIDER_SITE_OTHER): Payer: Medicare Other | Admitting: Internal Medicine

## 2020-03-19 ENCOUNTER — Encounter: Payer: Self-pay | Admitting: Internal Medicine

## 2020-03-19 ENCOUNTER — Other Ambulatory Visit: Payer: Self-pay

## 2020-03-19 VITALS — BP 128/82 | HR 56 | Temp 97.7°F | Ht 73.0 in | Wt 235.0 lb

## 2020-03-19 DIAGNOSIS — G720 Drug-induced myopathy: Secondary | ICD-10-CM | POA: Diagnosis not present

## 2020-03-19 DIAGNOSIS — E042 Nontoxic multinodular goiter: Secondary | ICD-10-CM

## 2020-03-19 DIAGNOSIS — Z1389 Encounter for screening for other disorder: Secondary | ICD-10-CM

## 2020-03-19 DIAGNOSIS — D649 Anemia, unspecified: Secondary | ICD-10-CM | POA: Diagnosis not present

## 2020-03-19 DIAGNOSIS — K219 Gastro-esophageal reflux disease without esophagitis: Secondary | ICD-10-CM

## 2020-03-19 DIAGNOSIS — I471 Supraventricular tachycardia: Secondary | ICD-10-CM | POA: Diagnosis not present

## 2020-03-19 DIAGNOSIS — E041 Nontoxic single thyroid nodule: Secondary | ICD-10-CM | POA: Diagnosis not present

## 2020-03-19 DIAGNOSIS — M545 Low back pain, unspecified: Secondary | ICD-10-CM

## 2020-03-19 DIAGNOSIS — E785 Hyperlipidemia, unspecified: Secondary | ICD-10-CM

## 2020-03-19 DIAGNOSIS — M5416 Radiculopathy, lumbar region: Secondary | ICD-10-CM

## 2020-03-19 DIAGNOSIS — E1165 Type 2 diabetes mellitus with hyperglycemia: Secondary | ICD-10-CM

## 2020-03-19 DIAGNOSIS — G8929 Other chronic pain: Secondary | ICD-10-CM | POA: Diagnosis not present

## 2020-03-19 MED ORDER — METFORMIN HCL 1000 MG PO TABS: 1000.0000 mg | ORAL_TABLET | Freq: Two times a day (BID) | ORAL | 3 refills | Status: DC

## 2020-03-19 MED ORDER — CYCLOBENZAPRINE HCL 5 MG PO TABS: 5.0000 mg | ORAL_TABLET | Freq: Every evening | ORAL | 0 refills | Status: DC | PRN

## 2020-03-19 MED ORDER — OMEPRAZOLE 40 MG PO CPDR: 40.0000 mg | DELAYED_RELEASE_CAPSULE | Freq: Every day | ORAL | 3 refills | Status: DC

## 2020-03-19 NOTE — Progress Notes (Signed)
Chief Complaint  Patient presents with  . Follow-up  . Back Pain    lower backi pain for the last month. No known injuries. Saw PT and Ortho 3 months ago and this did not help. Pain 4/10   F/u  1. Low back pain s/p hemilaminectomy lumbar in 2014 then fusion in lumbar. Back pain is ongoing since 04/2019 and worse in the last 1 month.  He saw ortho and PT w/in the last 3 months and this did not help pain today 4/10 at times worse and grabs him with standing at times. Denies leg pain, bowel/bladder incontinence or saddle anesthesia.  He has tried a muscle relaxer in the past and willing to try again today not tried any topical pain patches otc.  He is agreeable to MRI  2. DM 2 7.4 12/12/19 on metformin 1245 mg bid, Trulicity 8.09 weekly needs refill of metformin  3. GERD on prilosec 40 mg qd refill due today  4. H/o SVT paroxysmal no recent episodes since on metoprolol 50 mg xl qd which is increased dose  5. HLD not able to tolerate statin or zetia and nexlitol was expensive and not candidate for repatha    Review of Systems  Constitutional: Negative for weight loss.  HENT: Negative for hearing loss.   Eyes: Negative for blurred vision.  Respiratory: Negative for wheezing.   Cardiovascular: Negative for chest pain.  Gastrointestinal: Negative for abdominal pain.  Musculoskeletal: Positive for back pain.  Skin: Negative for rash.  Neurological: Negative for headaches.  Psychiatric/Behavioral: Negative for depression. The patient is not nervous/anxious.    Past Medical History:  Diagnosis Date  . Actinic keratosis   . Allergy   . Anemia   . Arthritis   . Basal cell carcinoma   . CAD (coronary artery disease)   . Cancer (HCC)    BASAL CELL-FACE  . Complication of anesthesia    PT IS A RED HEAD AND STATES IT TAKES ALOT MORE ANESTHESIA TO SEDATE HIM  . Diabetes mellitus without complication ([place])   . Dysrhythmia    SVT  . Fatty liver   . GERD (gastroesophageal reflux disease)   .  Hyperlipidemia   . Melanoma ([place]) 02/02/2019   Left forearm. Superficial spreading. Tumor thickness 0.22m, anatomic level III. Excised: 02/22/2019, margins free.  . Multiple thyroid nodules   . Rheumatic fever   . Sleep apnea 1997   Uses C-Pap machine  . SVT (supraventricular tachycardia) ([place]   . Thyroid nodule    Past Surgical History:  Procedure Laterality Date  . APPENDECTOMY  1965  . BICEPS TENDON REPAIR Right 05/2015  . COLONOSCOPY  2016  . LUMBAR FUSION  2015   L4/L5  . RHINOPLASTY  1997  . TONSILLECTOMY  1956  . VASECTOMY  1981   Family History  Problem Relation Age of Onset  . Arthritis Mother   . Lung cancer Mother   . Hyperlipidemia Mother   . Diabetes Mother   . Arthritis Father   . Diabetes Father   . Lung disease Father   . Hyperlipidemia Maternal Grandmother   . Alcohol abuse Maternal Grandfather   . Hyperlipidemia Maternal Grandfather   . Drug abuse Cousin   . Hyperlipidemia Other        maternal side of family    Social History   Socioeconomic History  . Marital status: Married    Spouse name: Not on file  . Number of children: Not on file  . Years of  education: Not on file  . Highest education level: Not on file  Occupational History  . Not on file  Tobacco Use  . Smoking status: Never Smoker  . Smokeless tobacco: Former Systems developer    Types: Snuff  Substance and Sexual Activity  . Alcohol use: Yes    Alcohol/week: 1.0 - 2.0 standard drinks    Types: 1 - 2 Cans of beer per week    Comment: OCC  . Drug use: No  . Sexual activity: Not Currently  Other Topics Concern  . Not on file  Social History Narrative   Married    Former Nature conservation officer    2 daughters   Social Determinants of Radio broadcast assistant Strain:   . Difficulty of Paying Living Expenses:   Food Insecurity:   . Worried About Charity fundraiser in the Last Year:   . Arboriculturist in the Last Year:   Transportation Needs:   . Film/video editor (Medical):   Marland Kitchen Lack of  Transportation (Non-Medical):   Physical Activity:   . Days of Exercise per Week:   . Minutes of Exercise per Session:   Stress:   . Feeling of Stress :   Social Connections:   . Frequency of Communication with Friends and Family:   . Frequency of Social Gatherings with Friends and Family:   . Attends Religious Services:   . Active Member of Clubs or Organizations:   . Attends Archivist Meetings:   Marland Kitchen Marital Status:   Intimate Partner Violence:   . Fear of Current or Ex-Partner:   . Emotionally Abused:   Marland Kitchen Physically Abused:   . Sexually Abused:    Current Meds  Medication Sig  . azelastine (ASTELIN) 0.1 % nasal spray Place 1 spray into both nostrils 2 (two) times daily. Use in each nostril as directed  . cetirizine (ZYRTEC) 10 MG tablet Take 10 mg by mouth at bedtime.   . cholecalciferol (VITAMIN D3) 25 MCG (1000 UT) tablet Take 1,000 Units by mouth daily.  . Coenzyme Q10 (COQ10) 200 MG CAPS Take 200 mg by mouth daily.   . Dulaglutide (TRULICITY) 5.36 RW/4.3XV SOPN Inject 0.75 mg into the skin once a week.  . Insulin Pen Needle (PEN NEEDLES) 30G X 8 MM MISC 1 Device by Does not apply route once a week.  Marland Kitchen ketoconazole (NIZORAL) 2 % shampoo   . metFORMIN (GLUCOPHAGE) 1000 MG tablet Take 1 tablet (1,000 mg total) by mouth 2 (two) times daily with a meal. .  . metoprolol succinate (TOPROL-XL) 50 MG 24 hr tablet Take 1 tablet (50 mg total) by mouth daily. Take with or immediately following a meal.  . Multiple Vitamins-Minerals (MULTIVITAMIN ADULT PO) Take 1 tablet by mouth daily.   . Omega-3 Fatty Acids (FISH OIL) 1200 MG CPDR Take 1 tablet by mouth daily.  Marland Kitchen omeprazole (PRILOSEC) 40 MG capsule Take 1 capsule (40 mg total) by mouth daily.  Marland Kitchen OVER THE COUNTER MEDICATION Take 1 tablet by mouth daily. Osteo biflex   . vitamin C (ASCORBIC ACID) 500 MG tablet Take 1,000 mg by mouth daily.   . vitamin E 100 UNIT capsule Take 400 Units by mouth daily.   . [DISCONTINUED] metFORMIN  (GLUCOPHAGE) 1000 MG tablet Take 1 tablet (1,000 mg total) by mouth 2 (two) times daily with a meal. .  . [DISCONTINUED] omeprazole (PRILOSEC) 40 MG capsule Take 1 capsule (40 mg total) by mouth daily.   Allergies  Allergen Reactions  . Januvia [Sitagliptin]     Kidney pain   . Lipitor [Atorvastatin]     10 mg ?myalgias  . Statins Other (See Comments)    Myalgias   Recent Results (from the past 2160 hour(s))  PSA     Status: None   Collection Time: 12/26/19  2:19 PM  Result Value Ref Range   Prostate Specific Ag, Serum 3.5 0.0 - 4.0 ng/mL    Comment: Roche ECLIA methodology. According to the American Urological Association, Serum PSA should decrease and remain at undetectable levels after radical prostatectomy. The AUA defines biochemical recurrence as an initial PSA value 0.2 ng/mL or greater followed by a subsequent confirmatory PSA value 0.2 ng/mL or greater. Values obtained with different assay methods or kits cannot be used interchangeably. Results cannot be interpreted as absolute evidence of the presence or absence of malignant disease.    Objective  Body mass index is 31 kg/m. Wt Readings from Last 3 Encounters:  03/19/20 235 lb (106.6 kg)  01/02/20 235 lb (106.6 kg)  12/19/19 230 lb (104.3 kg)   Temp Readings from Last 3 Encounters:  03/19/20 97.7 F (36.5 C) (Temporal)  12/05/19 98.5 F (36.9 C) (Oral)  11/22/18 98.2 F (36.8 C) (Oral)   BP Readings from Last 3 Encounters:  03/19/20 128/82  01/02/20 (!) 140/100  12/19/19 126/78   Pulse Readings from Last 3 Encounters:  03/19/20 (!) 56  01/02/20 65  12/05/19 65    Physical Exam Vitals and nursing note reviewed.  Constitutional:      Appearance: Normal appearance. He is well-developed and well-groomed. He is obese.  HENT:     Head: Normocephalic and atraumatic.  Eyes:     Conjunctiva/sclera: Conjunctivae normal.     Pupils: Pupils are equal, round, and reactive to light.  Cardiovascular:      Rate and Rhythm: Normal rate and regular rhythm.     Heart sounds: Normal heart sounds. No murmur.  Pulmonary:     Effort: Pulmonary effort is normal.     Breath sounds: Normal breath sounds.  Musculoskeletal:     Lumbar back: Normal.       Back:  Skin:    General: Skin is warm and dry.  Neurological:     General: No focal deficit present.     Mental Status: He is alert and oriented to person, place, and time. Mental status is at baseline.     Gait: Gait normal.  Psychiatric:        Attention and Perception: Attention and perception normal.        Mood and Affect: Mood and affect normal.        Speech: Speech normal.        Behavior: Behavior normal. Behavior is cooperative.        Thought Content: Thought content normal.        Cognition and Memory: Cognition and memory normal.        Judgment: Judgment normal.     Assessment  Plan  Lumbar radiculopathy and chronic low back pain s/p laminectomy in 2014 then fusion MRI lumbar in [place] 06/20/13    Impression:   1.L4-5 trace retrolisthesis, mild degenerative changes and a moderate-sized right lateral recess disc extrusion cause mild central canal stenosis and compression of the right L5 nerve root.Previous right hemilaminotomy with a moderate amount of  inflammatory and/or granulation material surrounding the disc herniation and right L5 nerve root.   2.L5-S1 multifactorial moderate right neural  foraminal stenosis.Moderate left-sided facet degenerative changes.  Electronically Signed by: Willaim Sheng on 06/20/2013 11:38 AM    Result Narrative  Exam: MRI SPINE LUMBAR W/WO CONT  Clinical Indication: Right leg pain.  Comparison: None are available.  Technique: Sagittal T1, T2 and STIR and axial T1 and T2 sequences were performed. Axial and sagittal T1 fat saturation sequences following the intravenous administration of contrast.The study was performed using an open MRI scanner.  Findings: No compression deformity  or worrisome marrow signal.The conus medullaris terminates at the L2 superior endplate.  L1-2: Minimal degenerative disc disease but no stenosis.  L2-3: Minimal degenerative disc disease but no stenosis.  L3-4: Mild degenerative disc disease and mild facet degenerative changes but no stenosis.  L4-5: 3 mm retrolisthesis, mild degenerative disc disease, moderate left lateral disc space narrowing, small endplate Schmorl's nodes and moderate left-sided hypertrophic facet degenerative changes result in mild left neural foraminal stenosis.The  right neural foramen remains widely patent.Moderate-sized right lateral recess disc extrusion measuring 10 mm in AP dimension, 13 mm in craniocaudal dimension and approximately 15 mm in transverse dimension demonstrates mild inferior migration to the  L5 superior endplate causing mild central canal stenosis and effacement of the right lateral recess with compression of the right L5 nerve root.Status post right hemilaminotomy with a moderate amount of enhancing inflammatory and/or granulation  material extending through the right lateral epidural space encasing the disc herniation and right L5 nerve root.There is also moderate enhancement surrounding the left facet joint which may represent a degenerative facet synovitis.  L5-S1: Mild degenerative disc disease, mild/moderate right lateral disc space narrowing, mild right and moderate left hypertrophic facet degenerative changes with a small right foraminal disc osteophyte complex and congenitally short pedicles cause  moderate right neural foraminal stenosis.The central canal and left neural foramina remain widely patent.Small amount of ill-defined enhancement adjacent to the left lamina and facet joint which may represent a degenerative facet synovitis but a  previous subtle laminotomy cannot be excluded      - Plan: MR Lumbar Spine Wo Contrast, Ambulatory referral to Neurosurgery Dr.  Donalee Citrin  Prn flexeril   Type 2 diabetes mellitus with hyperglycemia, without long-term current use of insulin ([place]) - Plan: metFORMIN (GLUCOPHAGE) 0092 MG tablet trulicity 3.30 weekly  Repeat fasting labs 06/10/20  Foot exam at f/u  Consider pna 23 at f/u   Gastroesophageal reflux disease without esophagitis Cont prilosec   Paroxysmal SVT (supraventricular tachycardia) (HCC) Resolved for cont  Cont cards f/u and toprol xl 50 mg qd   Hyperlipidemia, unspecified hyperlipidemia type Drug-induced myopathy to statins could not tolerate zetia either  repatha per Dr. Desmond Dike not candidate  nexlitol cost too much money   HM Reviewed labs8/2021 Flu shot utd Tdap utd Had prevnar  -->will need pna 23 in future  2/2 covid vaccines had    Consider shingrix in futuregiven Rxprev and he has twinrix3/3immune MMR immune PSA had 07/10/2019 4.3 elevated then 12/26/19 3.5 estab urology Dr. Diamantina [place] f/u 12/26/20 Zoster had 03/13/15  hep C neg  DermDr. Laurence Ferrari LN2 arms, scalpdue 08/2019 with h/o melanomaand seen 2021   Colonoscopy 10/17/15 normal fair prepconsider repeat in 5 years12/2021  CT chesthad6/12/19  CT CHEST WITHOUT CONTRAST  TECHNIQUE: Multidetector CT imaging of the chest was performed following the standard protocol without IV contrast.  COMPARISON:  None.  FINDINGS: Cardiovascular: Normal heart size. Trace pericardial thickening/fluid. Normal caliber thoracic aorta. Minimal atherosclerotic calcification of the aortic valve.  Mediastinum/Nodes: No enlarged mediastinal or axillary lymph  nodes. Enlarged, nodular right thyroid lobe with coarse calcifications. The trachea and esophagus demonstrate no significant findings.  Lungs/Pleura: No focal consolidation, pleural effusion, or pneumothorax. Small focal areas of scarring in the lingula and left lower lobe. No suspicious pulmonary nodule.  Upper Abdomen: No acute abnormality. 2.0 cm  simple cyst in the midpole of the right kidney. Subcentimeter lesions in the upper poles of both kidneys are too small to characterize.  Musculoskeletal: No chest wall mass or suspicious bone lesions identified.  IMPRESSION: 1.  No acute intrathoracic process. 2. Enlarged, nodular right thyroid lobe. Please see thyroid ultrasound from same day for further evaluation.  US thyroid 04/06/18 bx kc endo 04/2019 benign f/u 05/01/20 repeat thyroid US and 05/08/20 f/u Dr. Honor Junes   IMPRESSION: 1. Mild thyromegaly with bilateral nodules. 2. Recommend FNA biopsy of suspicious 2.5 cm inferior right AND moderately suspicious 2.8 cm mid right nodules. 3. Recommend annual/biennial ultrasound follow-up of additional lesions as above until stability x5 years confirmed  Sleep study 09/14/18 sleep med mild OSA total AHI 10 per hr, cpap titration 9 cm h20 but turned down to 8  -rec wt loss , nasal congestion, CPAP at 8 cm h20 he also had high periodic limb movement index with only few arousals ?RLS +/- insomnia   -CPAP 8 cm H20 humidified, [place] Full face mask large measures to maintain airway address nasal congestion   Provider: Dr. Olivia Mackie [name]-Internal Medicine

## 2020-03-19 NOTE — Patient Instructions (Addendum)
05/09/2019 Initial consult [facility]  [address], [place] [address]  [phone]  [address], [place] [address]  [phone]  [phone] (Fax   Dr. Elayne Guerin   Salonpas or liodcaine pain patches over the counter

## 2020-03-20 ENCOUNTER — Telehealth: Payer: Self-pay | Admitting: Internal Medicine

## 2020-03-20 DIAGNOSIS — T466X5A Adverse effect of antihyperlipidemic and antiarteriosclerotic drugs, initial encounter: Secondary | ICD-10-CM | POA: Insufficient documentation

## 2020-03-20 DIAGNOSIS — G720 Drug-induced myopathy: Secondary | ICD-10-CM | POA: Insufficient documentation

## 2020-03-20 NOTE — Telephone Encounter (Signed)
Left vm for pt to call ofc to sch MRI. 

## 2020-04-04 ENCOUNTER — Ambulatory Visit
Admission: RE | Admit: 2020-04-04 | Discharge: 2020-04-04 | Disposition: A | Discharge status: Home / Self Care | Payer: Medicare Other | Source: Ambulatory Visit | Attending: Internal Medicine | Admitting: Internal Medicine

## 2020-04-04 ENCOUNTER — Other Ambulatory Visit: Payer: Self-pay

## 2020-04-04 DIAGNOSIS — G8929 Other chronic pain: Secondary | ICD-10-CM | POA: Diagnosis not present

## 2020-04-04 DIAGNOSIS — M545 Low back pain, unspecified: Secondary | ICD-10-CM

## 2020-04-04 DIAGNOSIS — M5416 Radiculopathy, lumbar region: Secondary | ICD-10-CM | POA: Diagnosis not present

## 2020-04-04 DIAGNOSIS — M5126 Other intervertebral disc displacement, lumbar region: Secondary | ICD-10-CM | POA: Diagnosis not present

## 2020-04-04 IMAGING — MR MR LUMBAR SPINE W/O CM
5 series · 31 of 48 positions shown · non-contrast
Comparison: None.

CLINICAL DATA: Lumbar radiculopathy for over 6 weeks. Prior
laminectomy and fusion [DATE].

EXAM:
MRI LUMBAR SPINE WITHOUT CONTRAST
TECHNIQUE: Multiplanar, multisequence MR imaging of the lumbar spine was
performed. No intravenous contrast was administered.

[Series 5: T2 · sagittal · 4.0mm · 0.81mm/px · 7 of 17 slices shown (1 of 2)]
[im 1/17]
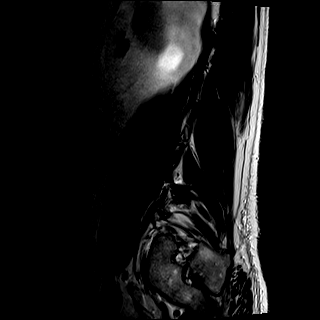
[im 3/17]
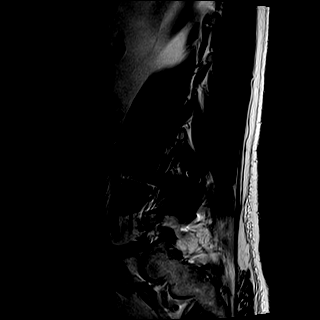
[im 6/17]
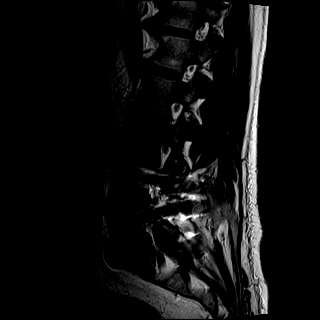
[im 9/17]
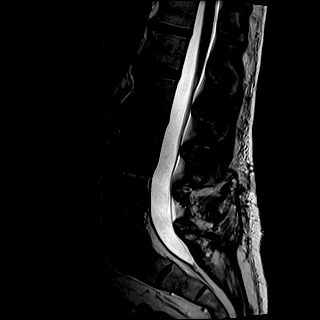
[im 11/17]
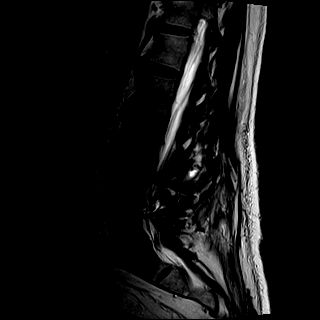
[im 14/17]
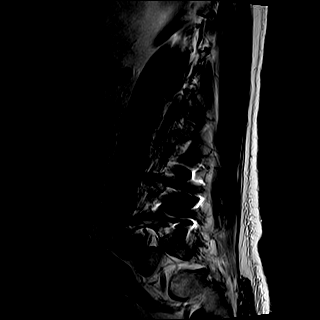
[im 17/17]
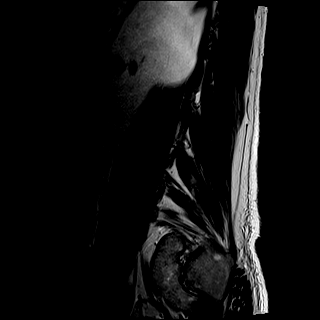

[Series 6: T1 · sagittal · 4.0mm · 0.81mm/px · 7 of 17 slices shown (1 of 2)]
[im 1/17]
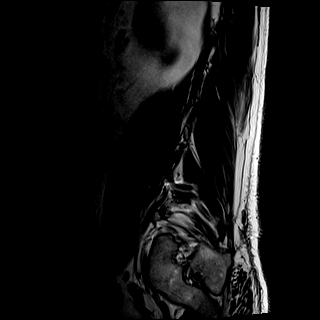
[im 3/17]
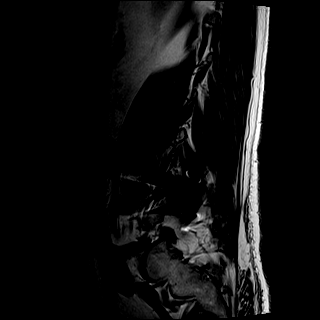
[im 6/17]
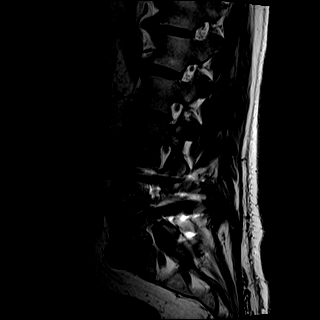
[im 9/17]
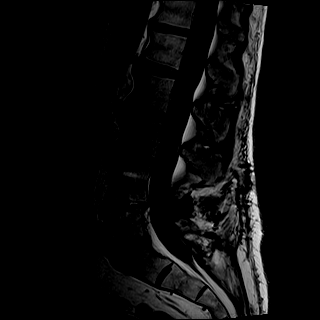
[im 11/17]
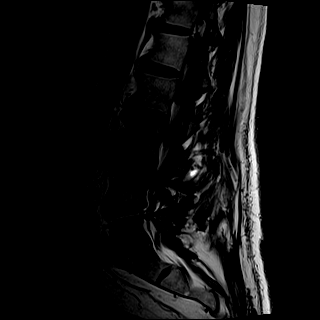
[im 14/17]
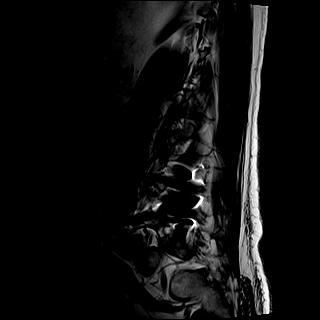
[im 17/17]
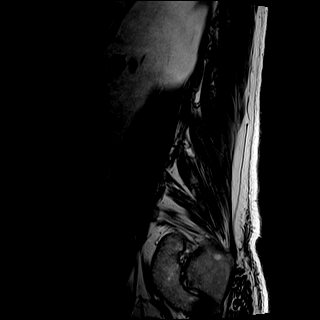

[Series 7: STIR · sagittal · 4.0mm · 0.41mm/px · 1 of 17 slices shown]
[im 1/17]
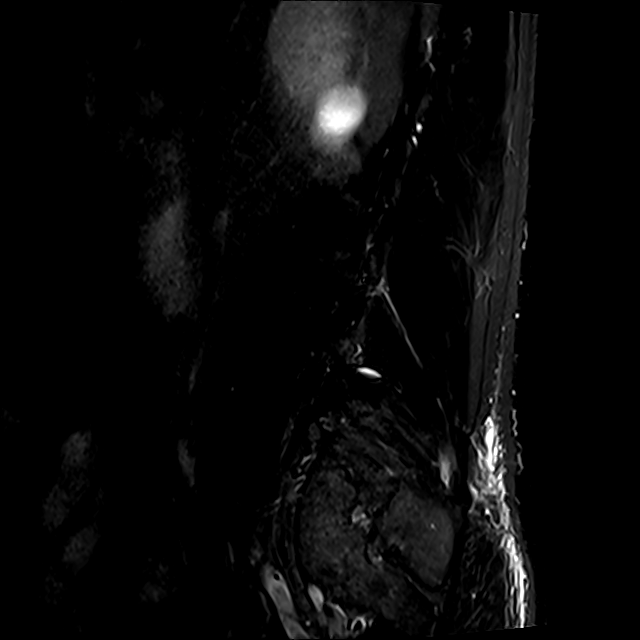

[Series 8: T2 · axial · 4.0mm · 0.78mm/px · z∈[-7,+207]mm · 8 of 38 slices shown (2 of 2)]
[im 1/38]
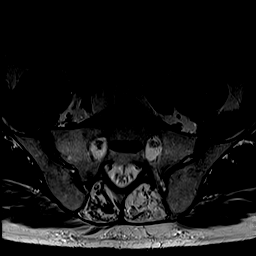
[im 6/38]
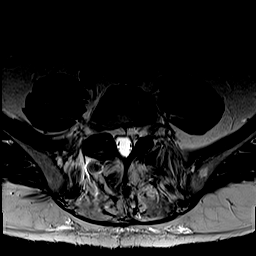
[im 12/38]
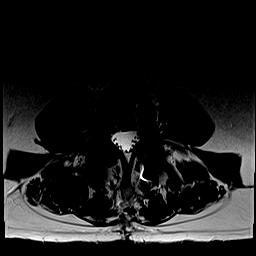
[im 18/38]
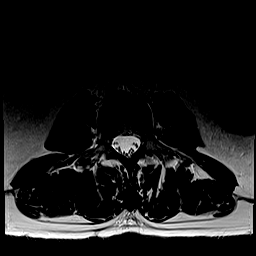
[im 20/38]
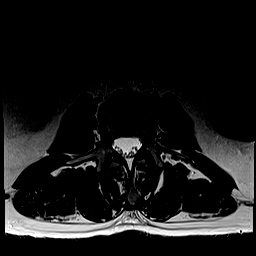
[im 26/38]
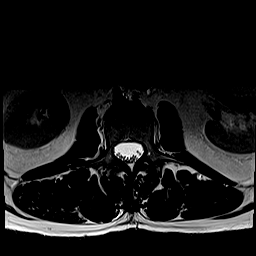
[im 32/38]
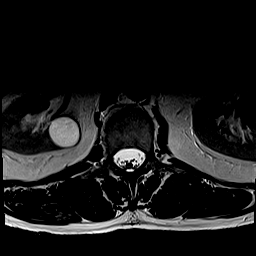
[im 38/38]
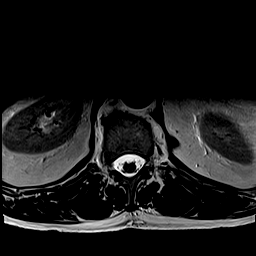

[Series 9: T1 · axial · 4.0mm · 0.39mm/px · z∈[-7,+207]mm · 8 of 38 slices shown (2 of 2)]
[im 1/38]
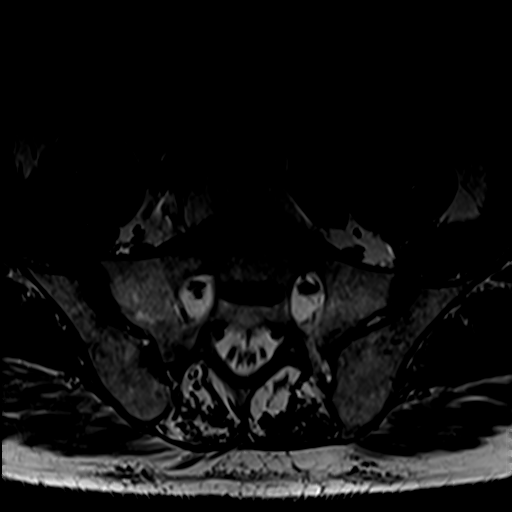
[im 6/38]
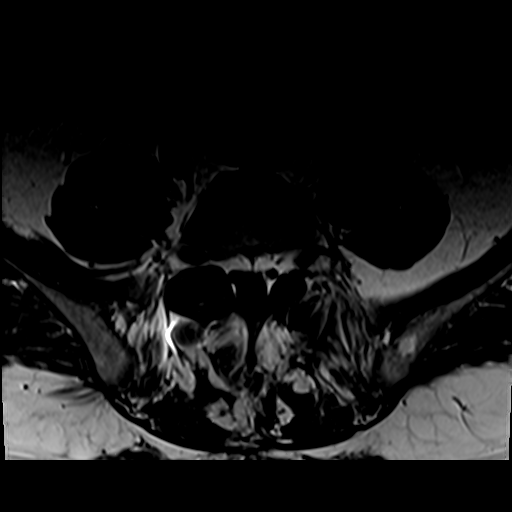
[im 12/38]
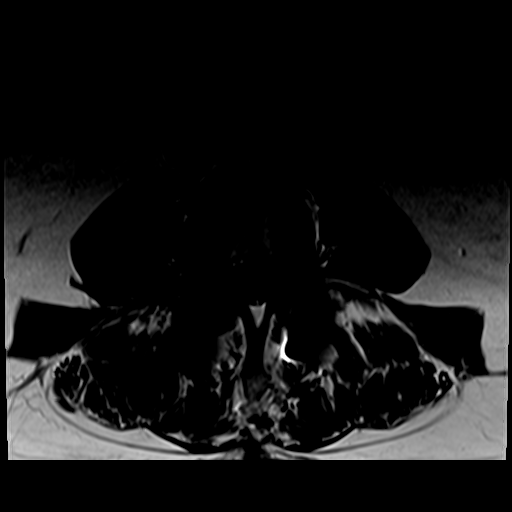
[im 18/38]
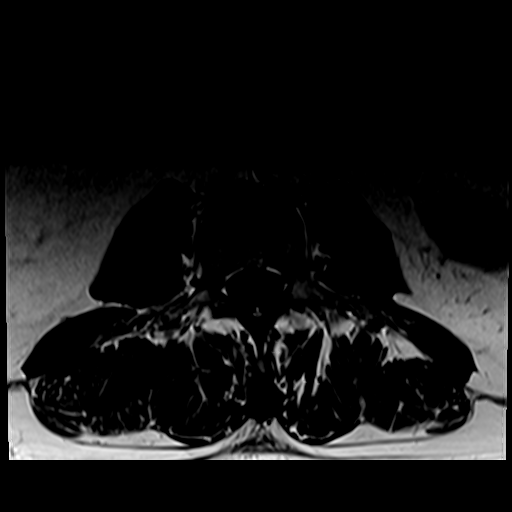
[im 20/38]
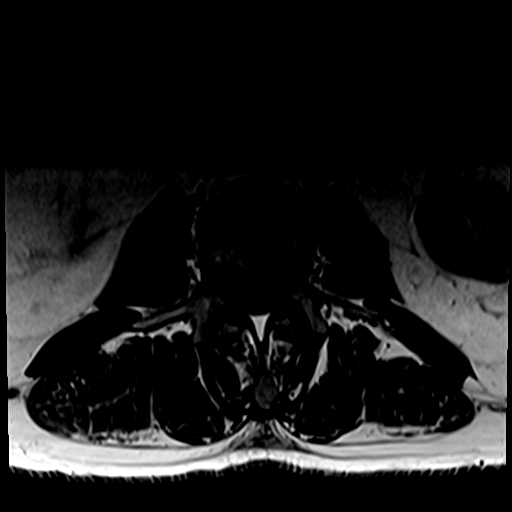
[im 26/38]
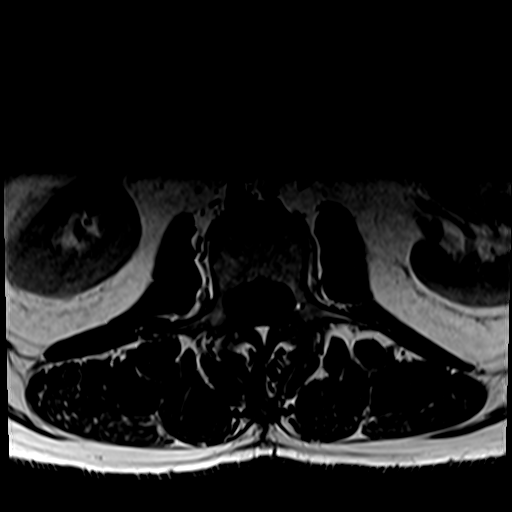
[im 32/38]
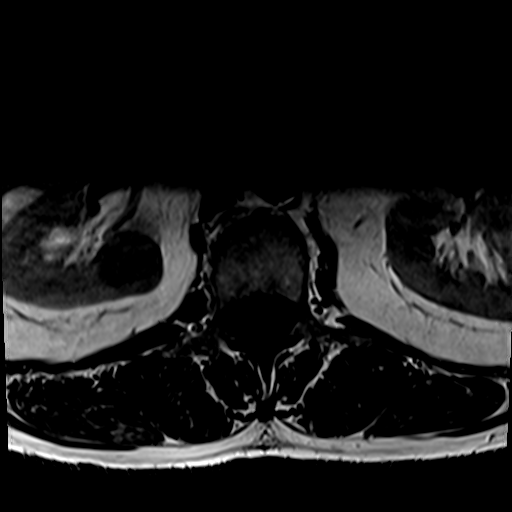
[im 38/38]
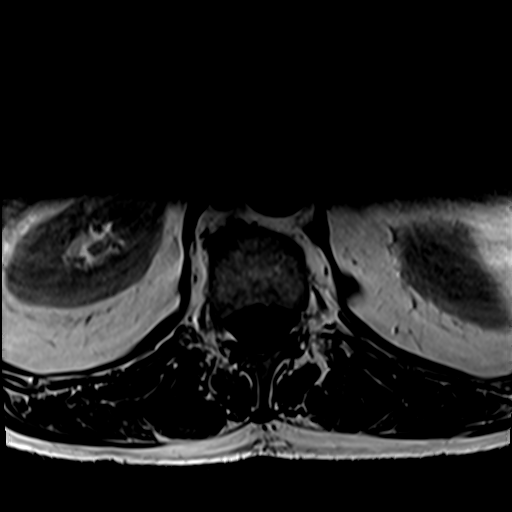

[31 of 48 positions shown; findings below may reference images not displayed]

FINDINGS: Segmentation:  Standard.

Alignment:  2 mm retrolisthesis of L4 on L5.

Vertebrae:  No fracture, evidence of discitis, or bone lesion.

Conus medullaris and cauda equina: Conus extends to the L1 level.
Conus and cauda equina appear normal.

Paraspinal and other soft tissues: No acute paraspinal abnormality.
Postsurgical changes in the posterior paraspinal soft tissues at
L4-5. edema in the multifidus muscle bilaterally likely
postsurgical.

Other: Mild osteoarthritis of bilateral SI joints.

Disc levels:

Disc spaces: Posterior lumbar interbody fusion at L4-5 without
hardware failure or complication.

T12-L1: No significant disc bulge. No evidence of neural foraminal
stenosis. No central canal stenosis.

L1-L2: No significant disc bulge. No evidence of neural foraminal
stenosis. No central canal stenosis.

L2-L3: No significant disc bulge. No evidence of neural foraminal
stenosis. No central canal stenosis.

L3-L4: No significant disc bulge. No evidence of neural foraminal
stenosis. No central canal stenosis.

L4-L5: Interbody fusion and decompressive laminectomy. No evidence
of neural foraminal stenosis. No central canal stenosis.

L5-S1: Broad-based disc bulge. Mild bilateral facet arthropathy. No
left foraminal stenosis. Moderate right foraminal stenosis. No
evidence of neural foraminal stenosis. No central canal stenosis.
IMPRESSION: 1. Posterior lumbar interbody fusion and decompression at L4-5
without foraminal or central canal stenosis.
2. At L5-S1 there is a broad-based disc bulge. Mild bilateral facet
arthropathy. No left foraminal stenosis. Moderate right foraminal
stenosis.
3.  No acute osseous injury of the lumbar spine.

## 2020-04-09 DIAGNOSIS — M545 Low back pain: Secondary | ICD-10-CM | POA: Diagnosis not present

## 2020-04-09 DIAGNOSIS — G8929 Other chronic pain: Secondary | ICD-10-CM | POA: Diagnosis not present

## 2020-05-03 ENCOUNTER — Encounter: Payer: Self-pay | Admitting: Internal Medicine

## 2020-05-03 DIAGNOSIS — E1165 Type 2 diabetes mellitus with hyperglycemia: Secondary | ICD-10-CM

## 2020-05-03 MED ORDER — TRULICITY 0.75 MG/0.5ML ~~LOC~~ SOAJ: 0.7500 mg | SUBCUTANEOUS | 2 refills | Status: DC

## 2020-05-07 MED ORDER — TRULICITY 0.75 MG/0.5ML ~~LOC~~ SOAJ: 0.7500 mg | SUBCUTANEOUS | 2 refills | Status: DC

## 2020-05-07 NOTE — Addendum Note (Signed)
Addended by: Thressa Sheller on: 05/07/2020 08:16 AM   Modules accepted: Orders

## 2020-05-08 DIAGNOSIS — G8929 Other chronic pain: Secondary | ICD-10-CM | POA: Diagnosis not present

## 2020-05-08 DIAGNOSIS — M545 Low back pain: Secondary | ICD-10-CM | POA: Diagnosis not present

## 2020-05-16 DIAGNOSIS — E042 Nontoxic multinodular goiter: Secondary | ICD-10-CM | POA: Diagnosis not present

## 2020-05-23 DIAGNOSIS — E042 Nontoxic multinodular goiter: Secondary | ICD-10-CM | POA: Diagnosis not present

## 2020-06-13 ENCOUNTER — Other Ambulatory Visit: Payer: Self-pay

## 2020-06-13 ENCOUNTER — Ambulatory Visit (INDEPENDENT_AMBULATORY_CARE_PROVIDER_SITE_OTHER): Payer: Medicare Other | Admitting: Dermatology

## 2020-06-13 DIAGNOSIS — L820 Inflamed seborrheic keratosis: Secondary | ICD-10-CM | POA: Diagnosis not present

## 2020-06-13 DIAGNOSIS — L821 Other seborrheic keratosis: Secondary | ICD-10-CM | POA: Diagnosis not present

## 2020-06-13 DIAGNOSIS — L570 Actinic keratosis: Secondary | ICD-10-CM | POA: Diagnosis not present

## 2020-06-13 DIAGNOSIS — Z1283 Encounter for screening for malignant neoplasm of skin: Secondary | ICD-10-CM | POA: Diagnosis not present

## 2020-06-13 DIAGNOSIS — L738 Other specified follicular disorders: Secondary | ICD-10-CM

## 2020-06-13 DIAGNOSIS — D229 Melanocytic nevi, unspecified: Secondary | ICD-10-CM | POA: Diagnosis not present

## 2020-06-13 DIAGNOSIS — Z85820 Personal history of malignant melanoma of skin: Secondary | ICD-10-CM

## 2020-06-13 DIAGNOSIS — D1800 Hemangioma unspecified site: Secondary | ICD-10-CM

## 2020-06-13 DIAGNOSIS — L814 Other melanin hyperpigmentation: Secondary | ICD-10-CM | POA: Diagnosis not present

## 2020-06-13 DIAGNOSIS — L280 Lichen simplex chronicus: Secondary | ICD-10-CM | POA: Diagnosis not present

## 2020-06-13 DIAGNOSIS — L578 Other skin changes due to chronic exposure to nonionizing radiation: Secondary | ICD-10-CM

## 2020-06-13 MED ORDER — TRIAMCINOLONE ACETONIDE 0.1 % EX OINT: 1.0000 "application " | TOPICAL_OINTMENT | Freq: Every day | CUTANEOUS | 0 refills | Status: DC

## 2020-06-13 NOTE — Progress Notes (Signed)
Follow-Up Visit   Subjective  Jackson [name] is a 69 y.o. male who presents for the following: full body skin exam and skin cancer screening (Patient here today for TBSE. History of MM. ).   Patient advises there are some new spots at bilateral temples that are scaly and sore. There is also an itchy spot on his right posterior shoulder.  The following portions of the chart were reviewed this encounter and updated as appropriate:  Tobacco  Allergies  Meds  Problems  Med Hx  Surg Hx  Fam Hx      Review of Systems:  No other skin or systemic complaints except as noted in HPI or Assessment and Plan.  Objective  Well appearing patient in no apparent distress; mood and affect are within normal limits.  A full examination was performed including scalp, head, eyes, ears, nose, lips, neck, chest, axillae, abdomen, back, buttocks, bilateral upper extremities, bilateral lower extremities, hands, feet, fingers, toes, fingernails, and toenails. All findings within normal limits unless otherwise noted below.  Objective  Left elbow x 1, R post shoulder x 2, upper mid back x 1, L mid back x 1, L lower back x 1 (6): Erythematous keratotic or waxy stuck-on papule or plaque.   Objective  R lower leg x 1, L brow x 2, L temple x 2 (5): Erythematous thin papules/macules with gritty scale.   Objective  Face: Yellow papules   Assessment & Plan  Inflamed seborrheic keratosis (6) Left elbow x 1, R post shoulder x 2, upper mid back x 1, L mid back x 1, L lower back x 1  Destruction of lesion - Left elbow x 1, R post shoulder x 2, upper mid back x 1, L mid back x 1, L lower back x 1 Complexity: simple   Destruction method: cryotherapy   Informed consent: discussed and consent obtained   Lesion destroyed using liquid nitrogen: Yes   Cryotherapy cycles:  2 Outcome: patient tolerated procedure well with no complications   Post-procedure details: wound care instructions given    AK (actinic  keratosis) (5) R lower leg x 1, L brow x 2, L temple x 2  Consider treatment with 5FU/calcipotriene.   Destruction of lesion - R lower leg x 1, L brow x 2, L temple x 2 Complexity: simple   Destruction method: cryotherapy   Informed consent: discussed and consent obtained   Lesion destroyed using liquid nitrogen: Yes   Cryotherapy cycles:  2 Outcome: patient tolerated procedure well with no complications   Post-procedure details: wound care instructions given    Lichen simplex chronicus Right Thigh  Start TMC 0.1% oint to AA once daily as needed and cover with bandaid. Avoid face, groin, underarms.   Topical steroids (such as triamcinolone, fluocinolone, fluocinonide, mometasone, clobetasol, halobetasol, betamethasone, hydrocortisone) can cause thinning and lightening of the skin if they are used for too long in the same area. Your physician has selected the right strength medicine for your problem and area affected on the body. Please use your medication only as directed by your physician to prevent side effects.    Ordered Medications: triamcinolone ointment (KENALOG) 0.1 %  Sebaceous hyperplasia Face  Benign-appearing.  Observation.  Call clinic for new or changing lesions.  Recommend daily use of broad spectrum spf 30+ sunscreen to sun-exposed areas.     History of Melanoma - No evidence of recurrence today, Left forearm. Superficial spreading. Tumor thickness 0.72mm, anatomic level III. Excised: 02/22/2019, margins  free. - No lymphadenopathy - Recommend regular full body skin exams - Recommend daily broad spectrum sunscreen SPF 30+ to sun-exposed areas, reapply every 2 hours as needed.  - Call if any new or changing lesions are noted between office visits  Lentigines - Scattered tan macules - Discussed due to sun exposure - Benign, observe - Call for any changes  Seborrheic Keratoses - Stuck-on, waxy, tan-brown papules and plaques  - Discussed benign etiology and  prognosis. - Observe - Call for any changes  Melanocytic Nevi - Tan-brown and/or pink-flesh-colored symmetric macules and papules - Benign appearing on exam today - Observation - Call clinic for new or changing moles - Recommend daily use of broad spectrum spf 30+ sunscreen to sun-exposed areas.   Hemangiomas - Red papules - Discussed benign nature - Observe - Call for any changes  Actinic Damage - diffuse scaly erythematous macules with underlying dyspigmentation - Recommend daily broad spectrum sunscreen SPF 30+ to sun-exposed areas, reapply every 2 hours as needed.  - Call for new or changing lesions.  Skin cancer screening performed today.   Return in about 4 months (around 10/13/2020) for TBSE, [place] follow up.  Graciella Belton, RMA, am acting as scribe for Forest Gleason, MD .  Documentation: I have reviewed the above documentation for accuracy and completeness, and I agree with the above.  Forest Gleason, MD

## 2020-06-13 NOTE — Patient Instructions (Addendum)
Melanoma ABCDEs  Melanoma is the most dangerous type of skin cancer, and is the leading cause of death from skin disease.  You are more likely to develop melanoma if you:  Have light-colored skin, light-colored eyes, or red or blond hair  Spend a lot of time in the sun  Tan regularly, either outdoors or in a tanning bed  Have had blistering sunburns, especially during childhood  Have a close family member who has had a melanoma  Have atypical moles or large birthmarks  Early detection of melanoma is key since treatment is typically straightforward and cure rates are extremely high if we catch it early.   The first sign of melanoma is often a change in a mole or a new dark spot.  The ABCDE system is a way of remembering the signs of melanoma.  A for asymmetry:  The two halves do not match. B for border:  The edges of the growth are irregular. C for color:  A mixture of colors are present instead of an even brown color. D for diameter:  Melanomas are usually (but not always) greater than 77mm - the size of a pencil eraser. E for evolution:  The spot keeps changing in size, shape, and color.  Please check your skin once per month between visits. You can use a small mirror in front and a large mirror behind you to keep an eye on the back side or your body.   If you see any new or changing lesions before your next follow-up, please call to schedule a visit.  Please continue daily skin protection including broad spectrum sunscreen SPF 30+ to sun-exposed areas, reapplying every 2 hours as needed when you're outdoors.   Cryotherapy Aftercare  . Wash gently with soap and water everyday.   Marland Kitchen Apply Vaseline and Band-Aid daily until healed.  Prior to procedure, discussed risks of blister formation, small wound, skin dyspigmentation, or rare scar following cryotherapy.   Topical steroids (such as triamcinolone, fluocinolone, fluocinonide, mometasone, clobetasol, halobetasol, betamethasone,  hydrocortisone) can cause thinning and lightening of the skin if they are used for too long in the same area. Your physician has selected the right strength medicine for your problem and area affected on the body. Please use your medication only as directed by your physician to prevent side effects.

## 2020-06-24 ENCOUNTER — Encounter: Payer: Self-pay | Admitting: Dermatology

## 2020-07-19 ENCOUNTER — Other Ambulatory Visit: Payer: Self-pay

## 2020-07-19 ENCOUNTER — Other Ambulatory Visit (INDEPENDENT_AMBULATORY_CARE_PROVIDER_SITE_OTHER): Payer: Medicare Other

## 2020-07-19 DIAGNOSIS — E041 Nontoxic single thyroid nodule: Secondary | ICD-10-CM | POA: Diagnosis not present

## 2020-07-19 DIAGNOSIS — E042 Nontoxic multinodular goiter: Secondary | ICD-10-CM

## 2020-07-19 DIAGNOSIS — M5416 Radiculopathy, lumbar region: Secondary | ICD-10-CM | POA: Diagnosis not present

## 2020-07-19 DIAGNOSIS — Z1389 Encounter for screening for other disorder: Secondary | ICD-10-CM | POA: Diagnosis not present

## 2020-07-19 DIAGNOSIS — E785 Hyperlipidemia, unspecified: Secondary | ICD-10-CM

## 2020-07-19 DIAGNOSIS — E1165 Type 2 diabetes mellitus with hyperglycemia: Secondary | ICD-10-CM | POA: Diagnosis not present

## 2020-07-19 DIAGNOSIS — M545 Low back pain, unspecified: Secondary | ICD-10-CM

## 2020-07-19 DIAGNOSIS — G8929 Other chronic pain: Secondary | ICD-10-CM | POA: Diagnosis not present

## 2020-07-19 DIAGNOSIS — D649 Anemia, unspecified: Secondary | ICD-10-CM | POA: Diagnosis not present

## 2020-07-19 LAB — COMPREHENSIVE METABOLIC PANEL
ALT: 36 U/L (ref 0–53)
AST: 22 U/L (ref 0–37)
Albumin: 4.3 g/dL (ref 3.5–5.2)
Alkaline Phosphatase: 41 U/L (ref 39–117)
BUN: 15 mg/dL (ref 6–23)
CO2: 30 mEq/L (ref 19–32)
Calcium: 9.7 mg/dL (ref 8.4–10.5)
Chloride: 100 mEq/L (ref 96–112)
Creatinine, Ser: 1.34 mg/dL (ref 0.40–1.50)
GFR: 53.02 mL/min — ABNORMAL LOW (ref >60.00)
Glucose, Bld: 136 mg/dL — ABNORMAL HIGH (ref 70–99)
Potassium: 4.1 mEq/L (ref 3.5–5.1)
Sodium: 139 mEq/L (ref 135–145)
Total Bilirubin: 0.6 mg/dL (ref 0.2–1.2)
Total Protein: 6.7 g/dL (ref 6.0–8.3)

## 2020-07-19 LAB — CBC WITH DIFFERENTIAL/PLATELET
Basophils Absolute: 0.1 10*3/uL (ref 0.0–0.1)
Basophils Relative: 0.6 % (ref 0.0–3.0)
Eosinophils Absolute: 0.2 10*3/uL (ref 0.0–0.7)
Eosinophils Relative: 2.5 % (ref 0.0–5.0)
HCT: 47.4 % (ref 39.0–52.0)
Hemoglobin: 16 g/dL (ref 13.0–17.0)
Lymphocytes Relative: 32 % (ref 12.0–46.0)
Lymphs Abs: 3.1 10*3/uL (ref 0.7–4.0)
MCHC: 33.8 g/dL (ref 30.0–36.0)
MCV: 90 fl (ref 78.0–100.0)
Monocytes Absolute: 0.6 10*3/uL (ref 0.1–1.0)
Monocytes Relative: 6.3 % (ref 3.0–12.0)
Neutro Abs: 5.7 10*3/uL (ref 1.4–7.7)
Neutrophils Relative %: 58.6 % (ref 43.0–77.0)
Platelets: 214 10*3/uL (ref 150.0–400.0)
RBC: 5.27 Mil/uL (ref 4.22–5.81)
RDW: 13.8 % (ref 11.5–15.5)
WBC: 9.6 10*3/uL (ref 4.0–10.5)

## 2020-07-19 LAB — LIPID PANEL
Cholesterol: 210 mg/dL — ABNORMAL HIGH (ref 0–200)
HDL: 28.2 mg/dL — ABNORMAL LOW (ref >39.00)
NonHDL: 181.82
Total CHOL/HDL Ratio: 7
Triglycerides: 214 mg/dL — ABNORMAL HIGH (ref 0.0–149.0)
VLDL: 42.8 mg/dL — ABNORMAL HIGH (ref 0.0–40.0)

## 2020-07-19 LAB — TSH: TSH: 1.66 u[IU]/mL (ref 0.35–4.50)

## 2020-07-19 LAB — HEMOGLOBIN A1C: Hgb A1c MFr Bld: 6.4 % (ref 4.6–6.5)

## 2020-07-19 LAB — LDL CHOLESTEROL, DIRECT: Direct LDL: 155 mg/dL

## 2020-07-20 LAB — URINALYSIS, ROUTINE W REFLEX MICROSCOPIC
Bilirubin Urine: NEGATIVE
Glucose, UA: NEGATIVE
Hgb urine dipstick: NEGATIVE
Ketones, ur: NEGATIVE
Leukocytes,Ua: NEGATIVE
Nitrite: NEGATIVE
Protein, ur: NEGATIVE
Specific Gravity, Urine: 1.013 (ref 1.001–1.03)
pH: 7 (ref 5.0–8.0)

## 2020-07-24 ENCOUNTER — Telehealth: Payer: Self-pay | Admitting: Internal Medicine

## 2020-07-24 ENCOUNTER — Other Ambulatory Visit: Payer: Self-pay

## 2020-07-24 ENCOUNTER — Encounter: Payer: Self-pay | Admitting: Internal Medicine

## 2020-07-24 ENCOUNTER — Telehealth (INDEPENDENT_AMBULATORY_CARE_PROVIDER_SITE_OTHER): Payer: Medicare Other | Admitting: Internal Medicine

## 2020-07-24 VITALS — BP 118/68 | Ht 73.0 in | Wt 225.0 lb

## 2020-07-24 DIAGNOSIS — E785 Hyperlipidemia, unspecified: Secondary | ICD-10-CM | POA: Diagnosis not present

## 2020-07-24 DIAGNOSIS — E1159 Type 2 diabetes mellitus with other circulatory complications: Secondary | ICD-10-CM | POA: Diagnosis not present

## 2020-07-24 DIAGNOSIS — G720 Drug-induced myopathy: Secondary | ICD-10-CM | POA: Diagnosis not present

## 2020-07-24 DIAGNOSIS — Z789 Other specified health status: Secondary | ICD-10-CM

## 2020-07-24 DIAGNOSIS — I10 Essential (primary) hypertension: Secondary | ICD-10-CM

## 2020-07-24 DIAGNOSIS — T466X5A Adverse effect of antihyperlipidemic and antiarteriosclerotic drugs, initial encounter: Secondary | ICD-10-CM

## 2020-07-24 DIAGNOSIS — I152 Hypertension secondary to endocrine disorders: Secondary | ICD-10-CM | POA: Insufficient documentation

## 2020-07-24 DIAGNOSIS — E611 Iron deficiency: Secondary | ICD-10-CM | POA: Diagnosis not present

## 2020-07-24 NOTE — Telephone Encounter (Signed)
This patient wanted to change to virtual at 3:30

## 2020-07-24 NOTE — Progress Notes (Signed)
Virtual Visit via Video Note  I connected with Jackson [name]   on 07/24/20 at  4:00 PM EDT by a video enabled telemedicine application and verified that I am speaking with the correct person using two identifiers.  Location patient: home Location provider:work or home office Persons participating in the virtual visit: patient, provider  I discussed the limitations of evaluation and management by telemedicine and the availability of in person appointments. The patient expressed understanding and agreed to proceed.   HPI: F/u 07/24/20 review of labs 07/19/20  1. Review of labs GFR 53 rec increase water intake 64 onces daily and try to limit nsaids he reports doing 1x per week for intermittent joint pain  2. HLD TC 210, HDL 28.20, TG 214, LDL 155 he is unable to tolerate all statins and zetia due to muscle weakness and zetia did not help and per cards not candidate for repatha  3. DM 2 A1C 12/12/19 69 yo 6.4 improved with HTN controlled and TSH normal  On toprol xl 50 mg qd  trulicity 0.5 weekly  Metformin 1000 mg bid   ROS: See pertinent positives and negatives per HPI.  Past Medical History:  Diagnosis Date  . Actinic keratosis   . Allergy   . Anemia   . Arthritis   . Basal cell carcinoma   . CAD (coronary artery disease)   . Cancer (HCC)    BASAL CELL-FACE  . Complication of anesthesia    PT IS A RED HEAD AND STATES IT TAKES ALOT MORE ANESTHESIA TO SEDATE HIM  . Diabetes mellitus without complication ([place])   . Dysrhythmia    SVT  . Fatty liver   . GERD (gastroesophageal reflux disease)   . Hyperlipidemia   . Melanoma ([place]) 02/02/2019   Left forearm. Superficial spreading. Tumor thickness 0.5m, anatomic level III. Excised: 02/22/2019, margins free.  . Multiple thyroid nodules   . Rheumatic fever   . Sleep apnea 1997   Uses C-Pap machine  . SVT (supraventricular tachycardia) ([facility]   . Thyroid nodule     Past Surgical History:  Procedure Laterality Date  . APPENDECTOMY   1965  . BICEPS TENDON REPAIR Right 05/2015  . COLONOSCOPY  2016  . LUMBAR FUSION  2015   L4/L5  . RHINOPLASTY  1997  . TONSILLECTOMY  1956  . VASECTOMY  1981     Current Outpatient Medications:  .  azelastine (ASTELIN) 0.1 % nasal spray, Place 1 spray into both nostrils 2 (two) times daily. Use in each nostril as directed, Disp: 90 mL, Rfl: 3 .  cetirizine (ZYRTEC) 10 MG tablet, Take 10 mg by mouth at bedtime. , Disp: , Rfl:  .  cholecalciferol (VITAMIN D3) 25 MCG (1000 UT) tablet, Take 1,000 Units by mouth daily., Disp: , Rfl:  .  Coenzyme Q10 (COQ10) 200 MG CAPS, Take 200 mg by mouth daily. , Disp: , Rfl:  .  Dulaglutide (TRULICITY) 00.34MVQ/2.5ZDSOPN, Inject 0.5 mLs (0.75 mg total) into the skin once a week., Disp: 3 pen, Rfl: 2 .  Insulin Pen Needle (PEN NEEDLES) 30G X 8 MM MISC, 1 Device by Does not apply route once a week., Disp: 30 each, Rfl: 11 .  ketoconazole (NIZORAL) 2 % shampoo, , Disp: , Rfl:  .  metFORMIN (GLUCOPHAGE) 1000 MG tablet, Take 1 tablet (1,000 mg total) by mouth 2 (two) times daily with a meal. ., Disp: 180 tablet, Rfl: 3 .  metoprolol succinate (TOPROL-XL) 50 MG 24 hr tablet,  Take 1 tablet (50 mg total) by mouth daily. Take with or immediately following a meal., Disp: 90 tablet, Rfl: 3 .  Multiple Vitamins-Minerals (MULTIVITAMIN ADULT PO), Take 1 tablet by mouth daily. , Disp: , Rfl:  .  Omega-3 Fatty Acids (FISH OIL) 1200 MG CPDR, Take 1 tablet by mouth daily., Disp: , Rfl:  .  omeprazole (PRILOSEC) 40 MG capsule, Take 1 capsule (40 mg total) by mouth daily., Disp: 90 capsule, Rfl: 3 .  OVER THE COUNTER MEDICATION, Take 1 tablet by mouth daily. Osteo biflex , Disp: , Rfl:  .  triamcinolone ointment (KENALOG) 0.1 %, Apply 1 application topically daily. As needed and cover with bandaid. Avoid face, groin, underarms., Disp: 80 g, Rfl: 0 .  vitamin C (ASCORBIC ACID) 500 MG tablet, Take 1,000 mg by mouth daily. , Disp: , Rfl:  .  vitamin E 100 UNIT capsule, Take 400  Units by mouth daily. , Disp: , Rfl:  .  cyclobenzaprine (FLEXERIL) 5 MG tablet, Take 1 tablet (5 mg total) by mouth at bedtime as needed for muscle spasms. (Patient not taking: Reported on 07/24/2020), Disp: 30 tablet, Rfl: 0  EXAM:  VITALS per patient if applicable:  GENERAL: alert, oriented, appears well and in no acute distress  HEENT: atraumatic, conjunttiva clear, no obvious abnormalities on inspection of external nose and ears  NECK: normal movements of the head and neck  LUNGS: on inspection no signs of respiratory distress, breathing rate appears normal, no obvious gross SOB, gasping or wheezing  CV: no obvious cyanosis  MS: moves all visible extremities without noticeable abnormality  PSYCH/NEURO: pleasant and cooperative, no obvious depression or anxiety, speech and thought processing grossly intact  ASSESSMENT AND PLAN:  Discussed the following assessment and plan:  Hypertension associated with diabetes ([place]) Hyperlipidemia, unspecified (h/o statin myopathy/intolerance)   On toprol xl 50 mg qd  trulicity 0.5 weekly  Metformin 1000 mg bid  Unable to tolerate statin rec healthy diet and exercise  Will need foot exam in future, pna 23  HM Reviewed labs8/2021 Flu shot utdupcoming 2021 Tdap utd Had prevnar  -->will need pna 23 in future  2/2 covid vaccines had considering booster Pfizer not sure yet  Consider shingrix in futuregiven Rxprev and he has twinrix3/3immune MMR immune  PSA had 07/10/2019 4.3 elevated then 12/26/19 3.5 estab urology Dr. Diamantina [place] f/u 12/26/20  Zoster had 03/13/15  hep C neg  DermDr. Laurence Ferrari LN2 arms, scalpdue 08/2019 with h/o melanomaand seen 2021   Colonoscopy 10/17/15 normal fair prepconsider repeat in 5 years12/2021 no FH colon cancer  Pt wants to do cologuard 09/2020   rec healthy diet and exercise   CT chesthad6/12/19  CT CHEST WITHOUT CONTRAST  TECHNIQUE: Multidetector CT imaging of the chest was  performed following the standard protocol without IV contrast.  COMPARISON: None.  FINDINGS: Cardiovascular: Normal heart size. Trace pericardial thickening/fluid. Normal caliber thoracic aorta. Minimal atherosclerotic calcification of the aortic valve.  Mediastinum/Nodes: No enlarged mediastinal or axillary lymph nodes. Enlarged, nodular right thyroid lobe with coarse calcifications. The trachea and esophagus demonstrate no significant findings.  Lungs/Pleura: No focal consolidation, pleural effusion, or pneumothorax. Small focal areas of scarring in the lingula and left lower lobe. No suspicious pulmonary nodule.  Upper Abdomen: No acute abnormality. 2.0 cm simple cyst in the midpole of the right kidney. Subcentimeter lesions in the upper poles of both kidneys are too small to characterize.  Musculoskeletal: No chest wall mass or suspicious bone lesions identified.  IMPRESSION: 1.  No acute intrathoracic process. 2. Enlarged, nodular right thyroid lobe. Please see thyroid ultrasound from same day for further evaluation.  US thyroid 04/06/18 bx [facility] endo 04/2019 benign f/u 05/01/20 repeat thyroid US and 05/08/20 f/u Dr. Honor Junes   IMPRESSION: 1. Mild thyromegaly with bilateral nodules. 2. Recommend FNA biopsy of suspicious 2.5 cm inferior right AND moderately suspicious 2.8 cm mid right nodules. 3. Recommend annual/biennial ultrasound follow-up of additional lesions as above until stability x5 years confirmed  Sleep study 09/14/18 sleep med mild OSA total AHI 10 per hr, cpap titration 9 cm h20 but turned down to 8  -rec wt loss , nasal congestion, CPAP at 8 cm h20 he also had high periodic limb movement index with only few arousals ?RLS +/- insomnia   -CPAP 8 cm H20 humidified, [place] Full face mask large measures to maintain airway address nasal congestion  -we discussed possible serious and likely etiologies, options for evaluation and workup, limitations of  telemedicine visit vs in person visit, treatment, treatment risks and precautions.     I discussed the assessment and treatment plan with the patient. The patient was provided an opportunity to ask questions and all were answered. The patient agreed with the plan and demonstrated an understanding of the instructions.     [name] [name], MD

## 2020-07-24 NOTE — Telephone Encounter (Signed)
Noted  

## 2020-08-23 ENCOUNTER — Telehealth: Payer: Self-pay | Admitting: Internal Medicine

## 2020-08-23 DIAGNOSIS — Z789 Other specified health status: Secondary | ICD-10-CM | POA: Insufficient documentation

## 2020-08-23 NOTE — Telephone Encounter (Signed)
Patient called and is requesting a referral to Dr. Dian Queen at [facility].

## 2020-08-26 NOTE — Telephone Encounter (Signed)
Okay for referral?

## 2020-08-27 NOTE — Telephone Encounter (Signed)
Pt has an appt with Dr Kathyrn Sheriff on  09/09/20

## 2020-08-27 NOTE — Telephone Encounter (Signed)
Noted  

## 2020-08-27 NOTE — Telephone Encounter (Signed)
Sorry need to clarify referred eye MD in [place] who is the name cant find this OR ENT Dr. Kathyrn Sheriff?

## 2020-08-27 NOTE — Telephone Encounter (Signed)
Left message to return call. Needing to know if Patient needs a referral to ENT or an Eye Doctor. The doctor he first requested is with ENT.

## 2020-09-04 ENCOUNTER — Other Ambulatory Visit: Payer: Self-pay

## 2020-09-04 ENCOUNTER — Ambulatory Visit (INDEPENDENT_AMBULATORY_CARE_PROVIDER_SITE_OTHER): Payer: Medicare Other | Admitting: Dermatology

## 2020-09-04 DIAGNOSIS — L570 Actinic keratosis: Secondary | ICD-10-CM

## 2020-09-04 DIAGNOSIS — Z872 Personal history of diseases of the skin and subcutaneous tissue: Secondary | ICD-10-CM | POA: Diagnosis not present

## 2020-09-04 DIAGNOSIS — L820 Inflamed seborrheic keratosis: Secondary | ICD-10-CM

## 2020-09-04 DIAGNOSIS — L821 Other seborrheic keratosis: Secondary | ICD-10-CM

## 2020-09-04 NOTE — Patient Instructions (Signed)
Cryotherapy Aftercare  . Wash gently with soap and water everyday.   . Apply Vaseline and Band-Aid daily until healed.  Prior to procedure, discussed risks of blister formation, small wound, skin dyspigmentation, or rare scar following cryotherapy.   

## 2020-09-04 NOTE — Progress Notes (Signed)
   Follow-Up Visit   Subjective  Jackson [name] is a 69 y.o. male who presents for the following: lesions (Patient here today for spots to be checked at arms, forehead and right leg. ).  Spots are itchy and one at right leg has been treated with LN2 previously.   The following portions of the chart were reviewed this encounter and updated as appropriate:  Tobacco  Allergies  Meds  Problems  Med Hx  Surg Hx  Fam Hx      Review of Systems:  No other skin or systemic complaints except as noted in HPI or Assessment and Plan.  Objective  Well appearing patient in no apparent distress; mood and affect are within normal limits.  A focused examination was performed including face, arms, legs. Relevant physical exam findings are noted in the Assessment and Plan.  Objective  L lat elbow x 1, L antecubital fossa x 1, L upper arm x 1, R ant thigh x 1, R temple x 1 (5): Erythematous keratotic or waxy stuck-on papule or plaque, thicker at right anterior thigh  Objective  Right Temple: Erythematous thin papules/macules with gritty scale.    Assessment & Plan  Inflamed seborrheic keratosis (5) L lat elbow x 1, L antecubital fossa x 1, L upper arm x 1, R ant thigh x 1, R temple x 1  Prior to procedure, discussed risks of blister formation, small wound, skin dyspigmentation, or rare scar following cryotherapy.    Destruction of lesion - L lat elbow x 1, L antecubital fossa x 1, L upper arm x 1, R ant thigh x 1, R temple x 1 Complexity: simple   Destruction method: cryotherapy   Informed consent: discussed and consent obtained   Lesion destroyed using liquid nitrogen: Yes   Cryotherapy cycles:  2 Outcome: patient tolerated procedure well with no complications   Post-procedure details: wound care instructions given    AK (actinic keratosis) Right Temple  Prior to procedure, discussed risks of blister formation, small wound, skin dyspigmentation, or rare scar following cryotherapy.     Destruction of lesion - Right Temple Complexity: simple   Destruction method: cryotherapy   Informed consent: discussed and consent obtained   Lesion destroyed using liquid nitrogen: Yes   Cryotherapy cycles:  2 Outcome: patient tolerated procedure well with no complications   Post-procedure details: wound care instructions given    History of PreCancerous Actinic Keratosis  - sites of PreCancerous Actinic Keratosis clear today (left temple, left brow, right lower leg) - these may recur and new lesions may form requiring treatment to prevent transformation into skin cancer - observe for new or changing spots and contact [place] for appointment if occur - photoprotection with sun protective clothing; sunglasses and broad spectrum sunscreen with SPF of at least 30 + and frequent self skin exams recommended - yearly exams by a dermatologist recommended for persons with history of PreCancerous Actinic Keratoses  Seborrheic Keratoses - Stuck-on, waxy, tan-brown papules and plaques  - Discussed benign etiology and prognosis. - Observe - Call for any changes  Return Jan 2022, for TBSE.  Graciella Belton, RMA, am acting as scribe for Forest Gleason, MD .  Documentation: I have reviewed the above documentation for accuracy and completeness, and I agree with the above.  Forest Gleason, MD

## 2020-09-05 ENCOUNTER — Encounter: Payer: Self-pay | Admitting: Internal Medicine

## 2020-09-05 DIAGNOSIS — Z1211 Encounter for screening for malignant neoplasm of colon: Secondary | ICD-10-CM | POA: Diagnosis not present

## 2020-09-05 DIAGNOSIS — Z1212 Encounter for screening for malignant neoplasm of rectum: Secondary | ICD-10-CM | POA: Diagnosis not present

## 2020-09-05 LAB — COLOGUARD
Cologuard: NEGATIVE
Cologuard: NEGATIVE

## 2020-09-09 ENCOUNTER — Telehealth: Payer: Self-pay | Admitting: *Deleted

## 2020-09-09 ENCOUNTER — Ambulatory Visit (INDEPENDENT_AMBULATORY_CARE_PROVIDER_SITE_OTHER): Payer: Medicare Other

## 2020-09-09 VITALS — Ht 73.0 in | Wt 225.0 lb

## 2020-09-09 DIAGNOSIS — Z0000 Encounter for general adult medical examination without abnormal findings: Secondary | ICD-10-CM

## 2020-09-09 DIAGNOSIS — H903 Sensorineural hearing loss, bilateral: Secondary | ICD-10-CM | POA: Diagnosis not present

## 2020-09-09 DIAGNOSIS — E1165 Type 2 diabetes mellitus with hyperglycemia: Secondary | ICD-10-CM | POA: Diagnosis not present

## 2020-09-09 DIAGNOSIS — J3489 Other specified disorders of nose and nasal sinuses: Secondary | ICD-10-CM | POA: Diagnosis not present

## 2020-09-09 NOTE — Patient Instructions (Addendum)
[name] , Thank you for taking time to come for your Medicare Wellness Visit. I appreciate your ongoing commitment to your health goals. Please review the following plan we discussed and let me know if I can assist you in the future.   These are the goals we discussed: Goals      Patient Stated   .  Increase physical activity (pt-stated)      Be more consistent with walking and treadmill while continuing to lose weight. Up to 150 minutes per week.       This is a list of the screening recommended for you and due dates:  Health Maintenance  Topic Date Due  . Pneumonia vaccines (2 of 2 - PPSV23) 01/21/2019  . Complete foot exam   11/23/2019  . Urine Protein Check  12/11/2020  . Eye exam for diabetics  12/17/2020  . Hemoglobin A1C  01/16/2021  . Colon Cancer Screening  10/26/2025  . Tetanus Vaccine  03/21/2028  . Flu Shot  Completed  . COVID-19 Vaccine  Completed    Immunizations Immunization History  Administered Date(s) Administered  . Fluad Quad(high Dose 65+) 08/09/2020  . Hep A / Hep B 03/03/2018, 03/31/2018, 09/01/2018  . Influenza, High Dose Seasonal PF 07/04/2018, 07/30/2019  . Influenza,inj,Quad PF,6+ Mos 07/20/2017  . Influenza-Unspecified 07/04/2016, 08/10/2019  . PFIZER SARS-COV-2 Vaccination 01/04/2020, 02/01/2020, 09/05/2020  . Pneumococcal Conjugate-13 01/20/2018  . Tdap 03/21/2018   Keep all routine maintenance appointments.   Next scheduled fasting lab 12/11/20 @ 8:15.  Follow up 12/19/19 @ 10:30  Advanced directives: not yet completed.   Referral sent to [facility] Diabetic Management to discuss [facility].   Follow up in one year for your annual wellness visit.   Preventive Care 68 Years and Older, Male Preventive care refers to lifestyle choices and visits with your health care provider that can promote health and wellness. What does preventive care include?  A yearly physical exam. This is also called an annual well check.  Dental exams once or  twice a year.  Routine eye exams. Ask your health care provider how often you should have your eyes checked.  Personal lifestyle choices, including:  Daily care of your teeth and gums.  Regular physical activity.  Eating a healthy diet.  Avoiding tobacco and drug use.  Limiting alcohol use.  Practicing safe sex.  Taking low doses of aspirin every day.  Taking vitamin and mineral supplements as recommended by your health care provider. What happens during an annual well check? The services and screenings done by your health care provider during your annual well check will depend on your age, overall health, lifestyle risk factors, and family history of disease. Counseling  Your health care provider may ask you questions about your:  Alcohol use.  Tobacco use.  Drug use.  Emotional well-being.  Home and relationship well-being.  Sexual activity.  Eating habits.  History of falls.  Memory and ability to understand (cognition).  Work and work Statistician. Screening  You may have the following tests or measurements:  Height, weight, and BMI.  Blood pressure.  Lipid and cholesterol levels. These may be checked every 5 years, or more frequently if you are over 85 years old.  Skin check.  Lung cancer screening. You may have this screening every year starting at age 68 if you have a 30-pack-year history of smoking and currently smoke or have quit within the past 15 years.  Fecal occult blood test (FOBT) of the stool. You  may have this test every year starting at age 68.  Flexible sigmoidoscopy or colonoscopy. You may have a sigmoidoscopy every 5 years or a colonoscopy every 10 years starting at age 40.  Prostate cancer screening. Recommendations will vary depending on your family history and other risks.  Hepatitis C blood test.  Hepatitis B blood test.  Sexually transmitted disease (STD) testing.  Diabetes screening. This is done by checking your blood  sugar (glucose) after you have not eaten for a while (fasting). You may have this done every 1-3 years.  Abdominal aortic aneurysm (AAA) screening. You may need this if you are a current or former smoker.  Osteoporosis. You may be screened starting at age 44 if you are at high risk. Talk with your health care provider about your test results, treatment options, and if necessary, the need for more tests. Vaccines  Your health care provider may recommend certain vaccines, such as:  Influenza vaccine. This is recommended every year.  Tetanus, diphtheria, and acellular pertussis (Tdap, Td) vaccine. You may need a Td booster every 10 years.  Zoster vaccine. You may need this after age 57.  Pneumococcal 13-valent conjugate (PCV13) vaccine. One dose is recommended after age 76.  Pneumococcal polysaccharide (PPSV23) vaccine. One dose is recommended after age 12. Talk to your health care provider about which screenings and vaccines you need and how often you need them. This information is not intended to replace advice given to you by your health care provider. Make sure you discuss any questions you have with your health care provider. Document Released: 11/08/2015 Document Revised: 07/01/2016 Document Reviewed: 08/13/2015 Elsevier Interactive Patient Education  2017 [place] Prevention in the Home Falls can cause injuries. They can happen to people of all ages. There are many things you can do to make your home safe and to help prevent falls. What can I do on the outside of my home?  Regularly fix the edges of walkways and driveways and fix any cracks.  Remove anything that might make you trip as you walk through a door, such as a raised step or threshold.  Trim any bushes or trees on the path to your home.  Use bright outdoor lighting.  Clear any walking paths of anything that might make someone trip, such as rocks or tools.  Regularly check to see if handrails are loose or  broken. Make sure that both sides of any steps have handrails.  Any raised decks and porches should have guardrails on the edges.  Have any leaves, snow, or ice cleared regularly.  Use sand or salt on walking paths during winter.  Clean up any spills in your garage right away. This includes oil or grease spills. What can I do in the bathroom?  Use night lights.  Install grab bars by the toilet and in the tub and shower. Do not use towel bars as grab bars.  Use non-skid mats or decals in the tub or shower.  If you need to sit down in the shower, use a plastic, non-slip stool.  Keep the floor dry. Clean up any water that spills on the floor as soon as it happens.  Remove soap buildup in the tub or shower regularly.  Attach bath mats securely with double-sided non-slip rug tape.  Do not have throw rugs and other things on the floor that can make you trip. What can I do in the bedroom?  Use night lights.  Make sure that you have a  light by your bed that is easy to reach.  Do not use any sheets or blankets that are too big for your bed. They should not hang down onto the floor.  Have a firm chair that has side arms. You can use this for support while you get dressed.  Do not have throw rugs and other things on the floor that can make you trip. What can I do in the kitchen?  Clean up any spills right away.  Avoid walking on wet floors.  Keep items that you use a lot in easy-to-reach places.  If you need to reach something above you, use a strong step stool that has a grab bar.  Keep electrical cords out of the way.  Do not use floor polish or wax that makes floors slippery. If you must use wax, use non-skid floor wax.  Do not have throw rugs and other things on the floor that can make you trip. What can I do with my stairs?  Do not leave any items on the stairs.  Make sure that there are handrails on both sides of the stairs and use them. Fix handrails that are  broken or loose. Make sure that handrails are as long as the stairways.  Check any carpeting to make sure that it is firmly attached to the stairs. Fix any carpet that is loose or worn.  Avoid having throw rugs at the top or bottom of the stairs. If you do have throw rugs, attach them to the floor with carpet tape.  Make sure that you have a light switch at the top of the stairs and the bottom of the stairs. If you do not have them, ask someone to add them for you. What else can I do to help prevent falls?  Wear shoes that:  Do not have high heels.  Have rubber bottoms.  Are comfortable and fit you well.  Are closed at the toe. Do not wear sandals.  If you use a stepladder:  Make sure that it is fully opened. Do not climb a closed stepladder.  Make sure that both sides of the stepladder are locked into place.  Ask someone to hold it for you, if possible.  Clearly mark and make sure that you can see:  Any grab bars or handrails.  First and last steps.  Where the edge of each step is.  Use tools that help you move around (mobility aids) if they are needed. These include:  Canes.  Walkers.  Scooters.  Crutches.  Turn on the lights when you go into a dark area. Replace any light bulbs as soon as they burn out.  Set up your furniture so you have a clear path. Avoid moving your furniture around.  If any of your floors are uneven, fix them.  If there are any pets around you, be aware of where they are.  Review your medicines with your doctor. Some medicines can make you feel dizzy. This can increase your chance of falling. Ask your doctor what other things that you can do to help prevent falls. This information is not intended to replace advice given to you by your health care provider. Make sure you discuss any questions you have with your health care provider. Document Released: 08/08/2009 Document Revised: 03/19/2016 Document Reviewed: 11/16/2014 Elsevier  Interactive Patient Education  2017 [employer].

## 2020-09-09 NOTE — Progress Notes (Addendum)
Subjective:   Jackson [name] is a 69 y.o. male who presents for Medicare Annual (Subsequent) preventive examination.  Review of Systems    No ROS.  Medicare Wellness Virtual Visit.  Cardiac Risk Factors include: advanced age (>69men, >1 women);diabetes mellitus;male gender;hypertension     Objective:    Today's Vitals   09/09/20 0904  Weight: 225 lb (102.1 kg)  Height: 6\' 1"  (1.854 m)   Body mass index is 29.69 kg/m.  Advanced Directives 09/09/2020 09/07/2019 10/03/2018 09/01/2018  Does Patient Have a Medical Advance Directive? No No No No  Does patient want to make changes to medical advance directive? - No - Patient declined - -  Would patient like information on creating a medical advance directive? No - Patient declined - No - Patient declined No - Patient declined    Current Medications (verified) Outpatient Encounter Medications as of 09/09/2020  Medication Sig   azelastine (ASTELIN) 0.1 % nasal spray Place 1 spray into both nostrils 2 (two) times daily. Use in each nostril as directed   cetirizine (ZYRTEC) 10 MG tablet Take 10 mg by mouth at bedtime.    cholecalciferol (VITAMIN D3) 25 MCG (1000 UT) tablet Take 1,000 Units by mouth daily.   Coenzyme Q10 (COQ10) 200 MG CAPS Take 200 mg by mouth daily.    cyclobenzaprine (FLEXERIL) 5 MG tablet Take 1 tablet (5 mg total) by mouth at bedtime as needed for muscle spasms. (Patient not taking: Reported on 07/24/2020)   Dulaglutide (TRULICITY) 0.09 FG/1.8EX SOPN Inject 0.5 mLs (0.75 mg total) into the skin once a week.   Insulin Pen Needle (PEN NEEDLES) 30G X 8 MM MISC 1 Device by Does not apply route once a week.   ketoconazole (NIZORAL) 2 % shampoo    metFORMIN (GLUCOPHAGE) 1000 MG tablet Take 1 tablet (1,000 mg total) by mouth 2 (two) times daily with a meal. .   metoprolol succinate (TOPROL-XL) 50 MG 24 hr tablet Take 1 tablet (50 mg total) by mouth daily. Take with or immediately following a meal.   Multiple  Vitamins-Minerals (MULTIVITAMIN ADULT PO) Take 1 tablet by mouth daily.    Omega-3 Fatty Acids (FISH OIL) 1200 MG CPDR Take 1 tablet by mouth daily.   omeprazole (PRILOSEC) 40 MG capsule Take 1 capsule (40 mg total) by mouth daily.   OVER THE COUNTER MEDICATION Take 1 tablet by mouth daily. Osteo biflex    triamcinolone ointment (KENALOG) 0.1 % Apply 1 application topically daily. As needed and cover with bandaid. Avoid face, groin, underarms.   vitamin C (ASCORBIC ACID) 500 MG tablet Take 1,000 mg by mouth daily.    vitamin E 100 UNIT capsule Take 400 Units by mouth daily.    No facility-administered encounter medications on file as of 09/09/2020.    Allergies (verified) Januvia [sitagliptin], Lipitor [atorvastatin], and Statins   History: Past Medical History:  Diagnosis Date   Actinic keratosis    Allergy    Anemia    Arthritis    Basal cell carcinoma    CAD (coronary artery disease)    Cancer (HCC)    BASAL CELL-FACE   Complication of anesthesia    PT IS A RED HEAD AND STATES IT TAKES ALOT MORE ANESTHESIA TO SEDATE HIM   Diabetes mellitus without complication (HCC)    Dysrhythmia    SVT   Fatty liver    GERD (gastroesophageal reflux disease)    Hyperlipidemia    Melanoma ([place]) 02/02/2019   Left forearm. Superficial  spreading. Tumor thickness 0.53mm, anatomic level III. Excised: 02/22/2019, margins free.   Multiple thyroid nodules    Rheumatic fever    Sleep apnea 1997   Uses C-Pap machine   SVT (supraventricular tachycardia) (HCC)    Thyroid nodule    Past Surgical History:  Procedure Laterality Date   APPENDECTOMY  1965   BICEPS TENDON REPAIR Right 05/2015   COLONOSCOPY  2016   LUMBAR FUSION  2015   L4/L5   RHINOPLASTY  1997   TONSILLECTOMY  1956   VASECTOMY  1981   Family History  Problem Relation Age of Onset   Arthritis Mother    Lung cancer Mother    Hyperlipidemia Mother    Diabetes Mother    Arthritis Father    Diabetes Father    Lung disease  Father    Hyperlipidemia Maternal Grandmother    Alcohol abuse Maternal Grandfather    Hyperlipidemia Maternal Grandfather    Drug abuse Cousin    Hyperlipidemia Other        maternal side of family    Social History   Socioeconomic History   Marital status: Married    Spouse name: Not on file   Number of children: Not on file   Years of education: Not on file   Highest education level: Not on file  Occupational History   Not on file  Tobacco Use   Smoking status: Never Smoker   Smokeless tobacco: Former Systems developer    Types: Snuff  Vaping Use   Vaping Use: Never used  Substance and Sexual Activity   Alcohol use: Yes    Alcohol/week: 1.0 - 2.0 standard drink    Types: 1 - 2 Cans of beer per week    Comment: OCC   Drug use: No   Sexual activity: Not Currently  Other Topics Concern   Not on file  Social History Narrative   Married    Former Nature conservation officer    2 daughters   Social Determinants of Radio broadcast assistant Strain: Low Risk    Difficulty of Paying Living Expenses: Not hard at all  Food Insecurity: No Food Insecurity   Worried About Charity fundraiser in the Last Year: Never true   Arboriculturist in the Last Year: Never true  Transportation Needs: No Transportation Needs   Lack of Transportation (Medical): No   Lack of Transportation (Non-Medical): No  Physical Activity:    Days of Exercise per Week: Not on file   Minutes of Exercise per Session: Not on file  Stress: No Stress Concern Present   Feeling of Stress : Not at all  Social Connections: Unknown   Frequency of Communication with Friends and Family: Not on file   Frequency of Social Gatherings with Friends and Family: Not on file   Attends Religious Services: Not on Electrical engineer or Organizations: Not on file   Attends Archivist Meetings: Not on file   Marital Status: Married    Tobacco Counseling Counseling given: Not Answered   Clinical Intake:  Pre-visit  preparation completed: Yes        Diabetes: Yes (Followed by pcp.)  How often do you need to have someone help you when you read instructions, pamphlets, or other written materials from your doctor or pharmacy?: 1 - Never   Nutrition Risk Assessment: Has the patient had any N/V/D within the last 2 months?  No  Does the patient have any  non-healing wounds?  No  Has the patient had any unintentional weight loss or weight gain?  No   Diabetes: If diabetic, was a CBG obtained today?  No .  Did the patient bring in their glucometer from home?  No . Virtual visit.   Financial Strains and Diabetes Management: Are you having any financial strains with the device, your supplies or your medication? No .  Does the patient want to be seen by Chronic Care Management for management of their diabetes?  Yes , referral placed to discuss [place].  Would the patient like to be referred to a Nutritionist or for Diabetic Management?  No   Diabetic Exams: Diabetic Eye Exam: Completed 12/18/19 Foot exam- denies wounds or any changes in feet. Followed by pcp. Next OV 12/18/20.   Interpreter Needed?: No      Activities of Daily Living In your present state of health, do you have any difficulty performing the following activities: 09/09/2020  Hearing? Y  Comment Followed by [facility]  Vision? N  Difficulty concentrating or making decisions? N  Walking or climbing stairs? N  Dressing or bathing? N  Doing errands, shopping? N  Preparing Food and eating ? N  Using the Toilet? N  In the past six months, have you accidently leaked urine? N  Do you have problems with loss of bowel control? N  Managing your Medications? N  Managing your Finances? N  Housekeeping or managing your Housekeeping? N  Some recent data might be hidden    Patient Care Team: [name], [name], MD as PCP - General (Internal Medicine) Coral Spikes, DO as Consulting Physician (Family Medicine) Bary Castilla Forest Gleason, MD  (General Surgery)  Indicate any recent Medical Services you may have received from other than [facility] providers in the past year (date may be approximate).     Assessment:   This is a routine wellness examination for Jackson [name].  I connected with Jackson [name] today by telephone and verified that I am speaking with the correct person using two identifiers. Location patient: home Location provider: work Persons participating in the virtual visit: patient, Marine scientist.    I discussed the limitations, risks, security and privacy concerns of performing an evaluation and management service by telephone and the availability of in person appointments. The patient expressed understanding and verbally consented to this telephonic visit.    Interactive audio and video telecommunications were attempted between this provider and patient, however failed, due to patient having technical difficulties OR patient did not have access to video capability.  We continued and completed visit with audio only.  Some vital signs may be absent or patient reported.   Hearing/Vision screen  Hearing Screening   125Hz  250Hz  500Hz  1000Hz  2000Hz  3000Hz  4000Hz  6000Hz  8000Hz   Right ear:           Left ear:           Comments: Patient has difficulty hearing conversational tones.  Hearing aids in use intermittently.  Followed by [facility].   Vision Screening Comments: Followed by [facility] Wears readers lenses Visual acuity not assessed, virtual visit.  They have seen their ophthalmologist in the last 12 months.    Dietary issues and exercise activities discussed: Current Exercise Habits: Home exercise routine, Type of exercise: walking;treadmill;calisthenics (7,000-8,000 steps daily), Intensity: Moderate  Goals       Patient Stated     Increase physical activity (pt-stated)      Be more consistent with walking and treadmill while  continuing to lose weight. Up to 150 minutes per week.       Depression  Screen PHQ 2/9 Scores 09/09/2020 03/19/2020 12/19/2019 09/07/2019 04/27/2019 10/03/2018 09/01/2018  PHQ - 2 Score 0 0 0 0 0 0 0    Fall Risk Fall Risk  09/09/2020 07/24/2020 03/19/2020 12/19/2019 09/07/2019  Falls in the past year? 0 0 0 0 0  Comment - - - - -  Number falls in past yr: 0 0 0 0 -  Injury with Fall? - 0 0 0 -  Follow up Falls evaluation completed Falls evaluation completed Falls evaluation completed Falls evaluation completed -   Handrails in use when climbing stairs? Yes Home free of loose throw rugs in walkways, pet beds, electrical cords, etc? Yes  Adequate lighting in your home to reduce risk of falls? Yes   ASSISTIVE DEVICES UTILIZED TO PREVENT FALLS: Use of a cane, walker or w/c? No   TIMED UP AND GO: Was the test performed? No . Virtual visit.   Cognitive Function: Patient is alert and oriented x3.  Denies difficulty focusing, making decisions, memory loss.  Enjoys reading, Optometrist and volunteering.  MMSE/6CIT deferred. Normal by direct communication/observation.   MMSE - Mini Mental State Exam 09/01/2018  Orientation to time 5  Orientation to Place 5  Registration 3  Attention/ Calculation 5  Recall 3  Language- name 2 objects 2  Language- repeat 1  Language- follow 3 step command 3  Language- read & follow direction 1  Write a sentence 1  Copy design 1  Total score 30     6CIT Screen 09/07/2019  What Year? 0 points  What month? 0 points  What time? 0 points  Count back from 20 0 points  Months in reverse 0 points  Repeat phrase 0 points  Total Score 0    Immunizations Immunization History  Administered Date(s) Administered   Fluad Quad(high Dose 65+) 08/09/2020   Hep A / Hep B 03/03/2018, 03/31/2018, 09/01/2018   Influenza, High Dose Seasonal PF 07/04/2018, 07/30/2019   Influenza,inj,Quad PF,6+ Mos 07/20/2017   Influenza-Unspecified 07/04/2016, 08/10/2019   PFIZER SARS-COV-2 Vaccination 01/04/2020, 02/01/2020, 09/05/2020    Pneumococcal Conjugate-13 01/20/2018   Tdap 03/21/2018   Pneumococcal vaccine 23- due. Prevnar 13 complete 01/20/18. Next schedule OV 12/19/19.   Health Maintenance Health Maintenance  Topic Date Due   PNA vac Low Risk Adult (2 of 2 - PPSV23) 01/21/2019   FOOT EXAM  11/23/2019   URINE MICROALBUMIN  12/11/2020   OPHTHALMOLOGY EXAM  12/17/2020   HEMOGLOBIN A1C  01/16/2021   COLONOSCOPY  10/26/2025   TETANUS/TDAP  03/21/2028   INFLUENZA VACCINE  Completed   COVID-19 Vaccine  Completed    Lung Cancer Screening: (Low Dose CT Chest recommended if Age 68-80 years, 30 pack-year currently smoking OR have quit w/in 15years.) does not qualify.   Hepatitis C Screening: does not qualify.  Dental Screening: Recommended annual dental exams for proper oral hygiene.  Cologuard- Notes recently completed. Results pending.   Community Resource Referral / Chronic Care Management: CRR required this visit?  No   CCM required this visit?  Yes . Patient is interested in using the [employer]. Referral sent for follow up.      Plan:   Keep all routine maintenance appointments.   Next scheduled fasting lab 12/11/20 @ 8:15   Follow up 12/19/19 @ 10:30  I have personally reviewed and noted the following in the patient's chart:   Medical and social  history Use of alcohol, tobacco or illicit drugs  Current medications and supplements Functional ability and status Nutritional status Physical activity Advanced directives List of other physicians Hospitalizations, surgeries, and ER visits in previous 12 months Vitals Screenings to include cognitive, depression, and falls Referrals and appointments  In addition, I have reviewed and discussed with patient certain preventive protocols, quality metrics, and best practice recommendations. A written personalized care plan for preventive services as well as general preventive health recommendations were provided to patient via mychart.      OBrien-Blaney, Cameryn Schum L, LPN   16/07/9603    I have reviewed the above information and agree with above.   Deborra Medina, MD

## 2020-09-09 NOTE — Chronic Care Management (AMB) (Signed)
  Chronic Care Management   Note  09/09/2020 Name: FOSTER FRERICKS MRN: [id number] DOB: 04/21/1951  OMRI BERTRAN is a 69 y.o. year old male who is a primary care patient of [name], [name], MD. I reached out to [name] by phone today in response to a referral sent by Mr. Clide Cliff PCP, [name], [name], MD.      Mr. Cullens was given information about Chronic Care Management services today including:  1. CCM service includes personalized support from designated clinical staff supervised by his physician, including individualized plan of care and coordination with other care providers 2. 24/7 contact phone numbers for assistance for urgent and routine care needs. 3. Service will only be billed when office clinical staff spend 20 minutes or more in a month to coordinate care. 4. Only one practitioner may furnish and bill the service in a calendar month. 5. The patient may stop CCM services at any time (effective at the end of the month) by phone call to the office staff. 6. The patient will be responsible for cost sharing (co-pay) of up to 20% of the service fee (after annual deductible is met).  Patient agreed to services and verbal consent obtained.   Follow up plan: Telephone appointment with care management team member scheduled for:09/12/2020  [place] [facility]  Direct Dial: [phone]

## 2020-09-12 ENCOUNTER — Ambulatory Visit: Payer: Medicare Other | Admitting: Pharmacist

## 2020-09-12 DIAGNOSIS — I152 Hypertension secondary to endocrine disorders: Secondary | ICD-10-CM

## 2020-09-12 DIAGNOSIS — G720 Drug-induced myopathy: Secondary | ICD-10-CM

## 2020-09-12 DIAGNOSIS — E1159 Type 2 diabetes mellitus with other circulatory complications: Secondary | ICD-10-CM

## 2020-09-12 DIAGNOSIS — E1165 Type 2 diabetes mellitus with hyperglycemia: Secondary | ICD-10-CM

## 2020-09-12 DIAGNOSIS — T466X5A Adverse effect of antihyperlipidemic and antiarteriosclerotic drugs, initial encounter: Secondary | ICD-10-CM

## 2020-09-12 DIAGNOSIS — E785 Hyperlipidemia, unspecified: Secondary | ICD-10-CM

## 2020-09-12 MED ORDER — PRAVASTATIN SODIUM 10 MG PO TABS: 10.0000 mg | ORAL_TABLET | Freq: Every day | ORAL | 1 refills | Status: DC

## 2020-09-12 MED ORDER — FREESTYLE LIBRE 2 SENSOR MISC: 3 refills | Status: DC

## 2020-09-12 NOTE — Patient Instructions (Addendum)
Jackson [name],   It was great talking to you!  We did 2 things today:   1) I sent an order for the FreeStyle Libre 2 Continuous Glucose Monitor sensors to [facility]. Let me know if this isn't covered and we need to talk about a Plan B  2) Let's try a different statin that has a lower incidence of muscle pains than others. Start pravstatin 10 mg daily. Moving forward, we may be able to increase this dose to target better LDL reduction, if you tolerate this medication.    Call me with any questions!  Jackson [name], PharmD [phone]  Visit Information  Patient Care Plan: Medication Management    Problem Identified: Diabetes, Hyperlipidemia, Hypertension     Long-Range Goal: Disease Management   Note:   Current Barriers:  . Unable to achieve control of hyperlipidemia   Pharmacist Clinical Goal(s):  Marland Kitchen Over the next 90 days, patient will achieve adherence to monitoring guidelines and medication adherence to achieve therapeutic efficacy through collaboration with PharmD and provider.   Interventions: . Inter-disciplinary care team collaboration (see longitudinal plan of care) . Comprehensive medication review performed; medication list updated in electronic medical record  Diabetes: . Controlled; current treatment: metformin 4920 mg BID, Trulicity 1.00 mg weekly . Patient reported being interested in [facility] to monitor sugars. He notes that some other friends with [employer] have coverage for [place] without being on insulin.  Jackson [name] script for [place] 2 sensors to [place] to see if covered. Patient will contact me if we need to pursue alternative routes or discuss cash price for sensors.   Hypertension: . Controlled; current treatment: metoprolol succinate 50 mg daily  . Recommended to continue current treatment.  Hyperlipidemia: . Controlled; current treatment: none . Medications previously tried: atorvastatin (notes that he had been on for years  and was fine, but then developed "muscle loss", also states some muscle aches. Reports that he also tried rosuvastatin, but is unsure of dose. Similar problems. Ezetimibe 10 mg daily was not beneficial for LDL per patient report.  . Previous discussions regarding non-statin therapy (PCSK9 or bempedoic acid). Insurance does not cover these agents for hyperlipidemia with primary prevention at this time, as these medications are only approved for secondary prevention.  . Educated on concepts of statin lipophilicity . Discussed that I would recommend we try pravastatin. Patient amenable. Start pravastatin 10 mg daily.   GERD: . Controlled per patient report; current treatment: omeprazole 40 mg daily   Allergies: . Controlled per patient report; current treatment: cetirizine 10 mg daily PRN, azelastine intranasal PRN  Patient Goals/Self-Care Activities . Over the next 90 days, patient will:  - take medications as prescribed check blood glucose at least TID with CGM, document, and provide at future appointments  Follow Up Plan: Telephone follow up appointment with care management team member scheduled for: ~ 4 weeks       Jackson [name] was given information about Chronic Care Management services today including:  1. CCM service includes personalized support from designated clinical staff supervised by his physician, including individualized plan of care and coordination with other care providers 2. 24/7 contact phone numbers for assistance for urgent and routine care needs. 3. Service will only be billed when office clinical staff spend 20 minutes or more in a month to coordinate care. 4. Only one practitioner may furnish and bill the service in a calendar month. 5. The patient may stop CCM services at any time (effective  at the end of the month) by phone call to the office staff. 6. The patient will be responsible for cost sharing (co-pay) of up to 20% of the service fee (after annual deductible is  met).  Patient agreed to services and verbal consent obtained.   The patient verbalized understanding of instructions, educational materials, and care plan provided today and agreed to receive a mailed copy of patient instructions, educational materials, and care plan.    Plan: Telephone follow up appointment with care management team member scheduled for: ~ 4 weeks  Jackson [name], PharmD, [place], [place] Pharmacist [place] [phone]

## 2020-09-12 NOTE — Chronic Care Management (AMB) (Signed)
Chronic Care Management   Pharmacy Note  09/12/2020 Name: Jackson [name] MRN: [id number] DOB: [birth year]   Subjective:  Jackson [name] is a 69 y.o. year old male who is a primary care patient of [name], [name], MD. The CCM team was consulted for assistance with chronic disease management and care coordination needs.    Engaged with patient by telephone for initial visit in response to provider referral for pharmacy case management and/or care coordination services.   Consent to Services:  Mr. Coury was given the following information about Chronic Care Management services today, agreed to services, and gave verbal consent: 1.CCM service includes personalized support from designated clinical staff supervised by his physician, including individualized plan of care and coordination with other care providers 2. 24/7 contact phone numbers for assistance for urgent and routine care needs. 3. Service will only be billed when office clinical staff spend 20 minutes or more in a month to coordinate care. 4. Only one practitioner may furnish and bill the service in a calendar month. 5.The patient may stop CCM services at any time (effective at the end of the month) by phone call to the office staff. 6. The patient will be responsible for cost sharing (co-pay) of up to 20% of the service fee (after annual deductible is met).  Review of patient status, including review of consultants reports, laboratory and other test data, was performed as part of comprehensive evaluation and provision of chronic care management services.   SDOH (Social Determinants of Health) assessments and interventions performed:  SDOH Interventions     Most Recent Value  SDOH Interventions  Financial Strain Interventions Intervention Not Indicated       Objective:  Lab Results  Component Value Date   CREATININE 1.34 07/19/2020   CREATININE 1.23 12/12/2019   CREATININE 1.36 06/01/2019    Lab Results    Component Value Date   HGBA1C 6.4 07/19/2020       Component Value Date/Time   CHOL 210 (H) 07/19/2020 0803   TRIG 214.0 (H) 07/19/2020 0803   HDL 28.20 (L) 07/19/2020 0803   CHOLHDL 7 07/19/2020 0803   VLDL 42.8 (H) 07/19/2020 0803   [place] 135 (H) 12/21/2018 0955   LDLDIRECT 155.0 07/19/2020 0803    Clinical ASCVD: No  The 10-year ASCVD risk score Mikey Bussing DC Jr., et al., 2013) is: 32%   Values used to calculate the score:     Age: 21 years     Sex: Male     Is Non-Hispanic African American: No     Diabetic: Yes     Tobacco smoker: No     Systolic Blood Pressure: 099 mmHg     Is BP treated: Yes     HDL Cholesterol: 28.2 mg/dL     Total Cholesterol: 210 mg/dL     BP Readings from Last 3 Encounters:  07/24/20 118/68  03/19/20 128/82  01/02/20 (!) 140/100    Assessment:   Allergies  Allergen Reactions  . Januvia [Sitagliptin]     Kidney pain   . Lipitor [Atorvastatin]     10 mg ?myalgias  . Statins Other (See Comments)    Myalgias    Medications Reviewed Today    Reviewed by De Hollingshead, RPH-CPP (Pharmacist) on 09/12/20 at 32  Med List Status: <None>  Medication Order Taking? Sig Documenting Provider Last Dose Status Informant  azelastine (ASTELIN) 0.1 % nasal spray [id number] Yes Place 1 spray into both nostrils 2 (two) times daily. Use  in each nostril as directed [name], [name], MD Taking Active   cetirizine (ZYRTEC) 10 MG tablet [id number] Yes Take 10 mg by mouth at bedtime.  [provider] Taking Active   cholecalciferol (VITAMIN D3) 25 MCG (1000 UT) tablet [id number] Yes Take 1,000 Units by mouth daily. [provider] Taking Active   Coenzyme Q10 (COQ10) 200 MG CAPS [id number] Yes Take 200 mg by mouth daily.  [provider] Taking Active        Patient not taking:      Discontinued 09/12/20 1043 (Completed Course)   Dulaglutide (TRULICITY) 8.89 VQ/9.4HW SOPN [id number] Yes Inject 0.5 mLs (0.75 mg total) into  the skin once a week. [name], [name], MD Taking Active         Discontinued 09/12/20 1041 (No longer needed (for PRN medications))   ketoconazole (NIZORAL) 2 % shampoo [id number] Yes  [provider] Taking Active   metFORMIN (GLUCOPHAGE) 1000 MG tablet [id number] Yes Take 1 tablet (1,000 mg total) by mouth 2 (two) times daily with a meal. . [name], [name], MD Taking Active   metoprolol succinate (TOPROL-XL) 50 MG 24 hr tablet [id number] Yes Take 1 tablet (50 mg total) by mouth daily. Take with or immediately following a meal. [name], [name], MD Taking Active   Multiple Vitamins-Minerals (MULTIVITAMIN ADULT PO) [id number] Yes Take 1 tablet by mouth daily.  [provider] Taking Active            Med Note [facility] Shiver   Tue Jan 02, 2020  3:33 PM)    Omega-3 Fatty Acids (FISH OIL) 1200 MG CPDR [id number] Yes Take 1 tablet by mouth daily. [provider] Taking Active   omeprazole (PRILOSEC) 40 MG capsule [id number] Yes Take 1 capsule (40 mg total) by mouth daily. [name], [name], MD Taking Active   OVER THE COUNTER MEDICATION [id number] Yes Take 1 tablet by mouth daily. Osteo biflex  [provider] Taking Active   triamcinolone ointment (KENALOG) 0.1 % [id number] Yes Apply 1 application topically daily. As needed and cover with bandaid. Avoid face, groin, underarms. Moye, [place], MD Taking Active   vitamin C (ASCORBIC ACID) 500 MG tablet [id number] Yes Take 1,000 mg by mouth daily.  [provider] Taking Active   vitamin E 100 UNIT capsule [id number] Yes Take 400 Units by mouth daily.  [provider] Taking Active           Patient Active Problem List   Diagnosis Date Noted  . Statin intolerance 08/23/2020  . Hypertension associated with diabetes ([place]) 07/24/2020  . Statin myopathy 03/20/2020  . Type 2 diabetes mellitus with hyperglycemia, without long-term current use of insulin ([place])  12/19/2019  . Coronary artery disease involving native coronary artery of native heart without angina pectoris 12/19/2019  . Elevated PSA 06/13/2019  . Iron deficiency anemia 06/13/2019  . Basal cell carcinoma of skin 03/02/2019  . Melanoma of skin ([place]) 02/21/2019  . AK (actinic keratosis) 11/23/2018  . Vertigo 11/23/2018  . Allergic rhinitis 07/04/2018  . Fatty liver 03/03/2018  . Bradycardia 03/03/2018  . Fatigue 01/23/2018  . Benign prostatic hyperplasia without lower urinary tract symptoms 01/23/2018  . Type 2 diabetes mellitus without complication, without long-term current use of insulin ([place]) 01/23/2018  . Thyroid nodule 01/20/2018  . Hyperlipidemia 11/26/2016  . DM type 2 with diabetic peripheral neuropathy ([place]) 05/27/2016  . Paroxysmal SVT (supraventricular tachycardia) ([place]) 05/27/2016  . GERD (gastroesophageal reflux disease) 05/27/2016  .  OSA (obstructive sleep apnea) 05/27/2016    Medication Assistance: None required. Patient affirms current coverage meets needs.   Patient Care Plan: Medication Management    Problem Identified: Diabetes, Hyperlipidemia, Hypertension     Long-Range Goal: Disease Management   Note:   Current Barriers:  . Unable to achieve control of hyperlipidemia   Pharmacist Clinical Goal(s):  Marland Kitchen Over the next 90 days, patient will achieve adherence to monitoring guidelines and medication adherence to achieve therapeutic efficacy through collaboration with PharmD and provider.   Interventions: . Inter-disciplinary care team collaboration (see longitudinal plan of care) . Comprehensive medication review performed; medication list updated in electronic medical record  Diabetes: . Controlled; current treatment: metformin 2334 mg BID, Trulicity 3.56 mg weekly . Patient reported being interested in [facility] to monitor sugars. He notes that some other friends with [employer] have coverage for [place] without being on insulin.  Durene Cal  script for [place] 2 sensors to [place] to see if covered. Patient will contact me if we need to pursue alternative routes or discuss cash price for sensors.   Hypertension: . Controlled; current treatment: metoprolol succinate 50 mg daily  . Recommended to continue current treatment.  Hyperlipidemia: . Controlled; current treatment: none . Medications previously tried: atorvastatin (notes that he had been on for years and was fine, but then developed "muscle loss", also states some muscle aches. Reports that he also tried rosuvastatin, but is unsure of dose. Similar problems. Ezetimibe 10 mg daily was not beneficial for LDL per patient report.  . Previous discussions regarding non-statin therapy (PCSK9 or bempedoic acid). Insurance does not cover these agents for hyperlipidemia with primary prevention at this time, as these medications are only approved for secondary prevention.  . Educated on concepts of statin lipophilicity . Discussed that I would recommend we try pravastatin. Patient amenable. Start pravastatin 10 mg daily.   GERD: . Controlled per patient report; current treatment: omeprazole 40 mg daily   Allergies: . Controlled per patient report; current treatment: cetirizine 10 mg daily PRN, azelastine intranasal PRN  Patient Goals/Self-Care Activities . Over the next 90 days, patient will:  - take medications as prescribed check blood glucose at least TID with CGM, document, and provide at future appointments  Follow Up Plan: Telephone follow up appointment with care management team member scheduled for: ~ 4 weeks       Plan: Telephone follow up appointment with care management team member scheduled for: ~ 4 weeks  Catie Darnelle Maffucci, PharmD, [place], [place] Pharmacist [place] [phone]

## 2020-09-15 LAB — EXTERNAL GENERIC LAB PROCEDURE: COLOGUARD: NEGATIVE

## 2020-09-15 LAB — COLOGUARD: COLOGUARD: NEGATIVE

## 2020-09-16 ENCOUNTER — Encounter: Payer: Self-pay | Admitting: Dermatology

## 2020-09-16 DIAGNOSIS — J3489 Other specified disorders of nose and nasal sinuses: Secondary | ICD-10-CM | POA: Diagnosis not present

## 2020-09-21 ENCOUNTER — Encounter: Payer: Self-pay | Admitting: Internal Medicine

## 2020-09-24 ENCOUNTER — Encounter: Payer: Self-pay | Admitting: Internal Medicine

## 2020-09-24 ENCOUNTER — Ambulatory Visit: Payer: Medicare Other | Admitting: Pharmacist

## 2020-09-24 ENCOUNTER — Telehealth: Payer: Self-pay | Admitting: Internal Medicine

## 2020-09-24 DIAGNOSIS — E785 Hyperlipidemia, unspecified: Secondary | ICD-10-CM

## 2020-09-24 DIAGNOSIS — E1165 Type 2 diabetes mellitus with hyperglycemia: Secondary | ICD-10-CM

## 2020-09-24 MED ORDER — FREESTYLE LIBRE 2 SENSOR MISC: 11 refills | Status: DC

## 2020-09-24 NOTE — Telephone Encounter (Signed)
cologuard negative

## 2020-09-24 NOTE — Chronic Care Management (AMB) (Signed)
Chronic Care Management   Pharmacy Note  09/24/2020 Name: Jackson [name] MRN: [id number] DOB: July 01, 1951   Subjective:  Jackson [name] is a 69 y.o. year old male who is a primary care patient of [name], [name], MD. The CCM team was consulted for assistance with chronic disease management and care coordination needs.    Follow up Care Coordination in response to provider referral for pharmacy case management and/or care coordination services.   Consent to Services:  Jackson [name] was given information about Chronic Care Management services, agreed to services, and gave verbal consent prior to initiation of services on 09/12/20. Please see initial visit note for detailed documentation.   SDOH (Social Determinants of Health) assessments and interventions performed:  none  Objective:  Lab Results  Component Value Date   CREATININE 1.34 07/19/2020   CREATININE 1.23 12/12/2019   CREATININE 1.36 06/01/2019    Lab Results  Component Value Date   HGBA1C 6.4 07/19/2020       Component Value Date/Time   CHOL 210 (H) 07/19/2020 0803   TRIG 214.0 (H) 07/19/2020 0803   HDL 28.20 (L) 07/19/2020 0803   CHOLHDL 7 07/19/2020 0803   VLDL 42.8 (H) 07/19/2020 0803   LDLCALC 135 (H) 12/21/2018 0955   LDLDIRECT 155.0 07/19/2020 0803    Clinical ASCVD: No  The 10-year ASCVD risk score Mikey Bussing DC Jr., et al., 2013) is: 32%   Values used to calculate the score:     Age: 61 years     Sex: Male     Is Non-Hispanic African American: No     Diabetic: Yes     Tobacco smoker: No     Systolic Blood Pressure: 580 mmHg     Is BP treated: Yes     HDL Cholesterol: 28.2 mg/dL     Total Cholesterol: 210 mg/dL      BP Readings from Last 3 Encounters:  07/24/20 118/68  03/19/20 128/82  01/02/20 (!) 140/100    Assessment/Interventions: Review of patient past medical history, allergies, medications, health status, including review of consultants reports, laboratory and other test data,  was performed as part of comprehensive evaluation and provision of chronic care management services.   Allergies  Allergen Reactions  . Januvia [Sitagliptin]     Kidney pain   . Lipitor [Atorvastatin]     10 mg ?myalgias  . Statins Other (See Comments)    Myalgias    Medications Reviewed Today    Reviewed by Alfonso Patten, MD (Physician) on 09/16/20 at [place] List Status: <None>  Medication Order Taking? Sig Documenting Provider Last Dose Status Informant  azelastine (ASTELIN) 0.1 % nasal spray [id number] No Place 1 spray into both nostrils 2 (two) times daily. Use in each nostril as directed [name], [name], MD Taking Active   cetirizine (ZYRTEC) 10 MG tablet [id number] No Take 10 mg by mouth at bedtime.  [provider] Taking Active   cholecalciferol (VITAMIN D3) 25 MCG (1000 UT) tablet [id number] No Take 1,000 Units by mouth daily. [provider] Taking Active   Coenzyme Q10 (COQ10) 200 MG CAPS [id number] No Take 200 mg by mouth daily.  [provider] Taking Active   Continuous Blood Gluc Sensor (FREESTYLE LIBRE 2 SENSOR) [place] [id number]  Use to check glucose Q8H [name], [name], MD  Active   Dulaglutide (TRULICITY) 2.29 NL/8.9QJ SOPN [id number] No Inject 0.5 mLs (0.75 mg total) into the skin once a week. [name], [name], MD Taking Active  ketoconazole (NIZORAL) 2 % shampoo [id number] No  [provider] Taking Active   metFORMIN (GLUCOPHAGE) 1000 MG tablet [id number] No Take 1 tablet (1,000 mg total) by mouth 2 (two) times daily with a meal. . [name], [name], MD Taking Active   metoprolol succinate (TOPROL-XL) 50 MG 24 hr tablet [id number] No Take 1 tablet (50 mg total) by mouth daily. Take with or immediately following a meal. [name], [name], MD Taking Active   Multiple Vitamins-Minerals (MULTIVITAMIN ADULT PO) [id number] No Take 1 tablet by mouth daily.  [provider] Taking Active              Med Note [facility] Graylin Shiver   Tue Jan 02, 2020  3:33 PM)    Omega-3 Fatty Acids (FISH OIL) 1200 MG CPDR [id number] No Take 1 tablet by mouth daily. [provider] Taking Active   omeprazole (PRILOSEC) 40 MG capsule [id number] No Take 1 capsule (40 mg total) by mouth daily. [name], [name], MD Taking Active   OVER THE COUNTER MEDICATION [id number] No Take 1 tablet by mouth daily. Osteo biflex  [provider] Taking Active   pravastatin (PRAVACHOL) 10 MG tablet [id number]  Take 1 tablet (10 mg total) by mouth daily. [name], [name], MD  Active   triamcinolone ointment (KENALOG) 0.1 % [id number] No Apply 1 application topically daily. As needed and cover with bandaid. Avoid face, groin, underarms. Moye, [place], MD Taking Active   vitamin C (ASCORBIC ACID) 500 MG tablet [id number] No Take 1,000 mg by mouth daily.  [provider] Taking Active   vitamin E 100 UNIT capsule [id number] No Take 400 Units by mouth daily.  [provider] Taking Active           Patient Active Problem List   Diagnosis Date Noted  . Statin intolerance 08/23/2020  . Hypertension associated with diabetes ([place]) 07/24/2020  . Statin myopathy 03/20/2020  . Type 2 diabetes mellitus with hyperglycemia, without long-term current use of insulin ([place]) 12/19/2019  . Coronary artery disease involving native coronary artery of native heart without angina pectoris 12/19/2019  . Elevated PSA 06/13/2019  . Iron deficiency anemia 06/13/2019  . Basal cell carcinoma of skin 03/02/2019  . Melanoma of skin ([place]) 02/21/2019  . AK (actinic keratosis) 11/23/2018  . Vertigo 11/23/2018  . Allergic rhinitis 07/04/2018  . Fatty liver 03/03/2018  . Bradycardia 03/03/2018  . Fatigue 01/23/2018  . Benign prostatic hyperplasia without lower urinary tract symptoms 01/23/2018  . Type 2 diabetes mellitus without complication, without long-term current use of insulin ([place]) 01/23/2018  .  Thyroid nodule 01/20/2018  . Hyperlipidemia 11/26/2016  . DM type 2 with diabetic peripheral neuropathy ([place]) 05/27/2016  . Paroxysmal SVT (supraventricular tachycardia) ([place]) 05/27/2016  . GERD (gastroesophageal reflux disease) 05/27/2016  . OSA (obstructive sleep apnea) 05/27/2016    Medication Assistance: None required. Patient affirms current coverage meets needs.   Patient Care Plan: Medication Management    Problem Identified: Diabetes, Hyperlipidemia, Hypertension     Long-Range Goal: Disease Management   Note:   Current Barriers:  . Unable to achieve control of hyperlipidemia  . Unsatisfied with CBG monitoring for diabetes  Pharmacist Clinical Goal(s):  Marland Kitchen Over the next 90 days, patient will achieve adherence to monitoring guidelines and medication adherence to achieve therapeutic efficacy through collaboration with PharmD and provider.   Interventions: . Inter-disciplinary care team collaboration (see longitudinal plan of care) . Comprehensive medication review performed; medication list updated in electronic  medical record  Diabetes: . Controlled; current treatment: metformin 5638 mg BID, Trulicity 9.37 mg weekly . [facility] [employer] did not cover [place] 2 sensors. Patient still interested in cash price. Providing with 1 sensor sample to try, script sent to [facility] for long term use. Patient notified.   Hypertension: . Controlled; current treatment: metoprolol succinate 50 mg daily  . Recommended to continue current treatment.  Hyperlipidemia: . Controlled; current treatment: pravastatin 10 mg daily  . Medications previously tried: atorvastatin (notes that he had been on for years and was fine, but then developed "muscle loss", also states some muscle aches. Reports that he also tried rosuvastatin, but is unsure of dose. Similar problems. Ezetimibe 10 mg daily was not beneficial for LDL per patient report.  . Will follow for tolerability at next  appointment  GERD: . Controlled per patient report; current treatment: omeprazole 40 mg daily   Allergies: . Controlled per patient report; current treatment: cetirizine 10 mg daily PRN, azelastine intranasal PRN  Patient Goals/Self-Care Activities . Over the next 90 days, patient will:  - take medications as prescribed check blood glucose at least TID with CGM, document, and provide at future appointments  Follow Up Plan: Telephone follow up appointment with care management team member scheduled for: ~ 3 weeks       Plan: Telephone follow up appointment with care management team member scheduled for: ~3 weeks as previously scheduled  Catie Darnelle Maffucci, PharmD, [place], [place] Pharmacist [place] [phone]

## 2020-09-24 NOTE — Patient Instructions (Signed)
Visit Information  Patient Care Plan: Medication Management    Problem Identified: Diabetes, Hyperlipidemia, Hypertension     Long-Range Goal: Disease Management   Note:   Current Barriers:  . Unable to achieve control of hyperlipidemia  . Unsatisfied with CBG monitoring for diabetes  Pharmacist Clinical Goal(s):  Marland Kitchen Over the next 90 days, patient will achieve adherence to monitoring guidelines and medication adherence to achieve therapeutic efficacy through collaboration with PharmD and provider.   Interventions: . Inter-disciplinary care team collaboration (see longitudinal plan of care) . Comprehensive medication review performed; medication list updated in electronic medical record  Diabetes: . Controlled; current treatment: metformin 1021 mg BID, Trulicity 1.17 mg weekly . [employer] did not cover [place] 2 sensors. Patient still interested in cash price. Providing with 1 sensor sample to try, script sent to [facility] for long term use. Patient notified.   Hypertension: . Controlled; current treatment: metoprolol succinate 50 mg daily  . Recommended to continue current treatment.  Hyperlipidemia: . Controlled; current treatment: pravastatin 10 mg daily  . Medications previously tried: atorvastatin (notes that he had been on for years and was fine, but then developed "muscle loss", also states some muscle aches. Reports that he also tried rosuvastatin, but is unsure of dose. Similar problems. Ezetimibe 10 mg daily was not beneficial for LDL per patient report.  . Will follow for tolerability at next appointment  GERD: . Controlled per patient report; current treatment: omeprazole 40 mg daily   Allergies: . Controlled per patient report; current treatment: cetirizine 10 mg daily PRN, azelastine intranasal PRN  Patient Goals/Self-Care Activities . Over the next 90 days, patient will:  - take medications as prescribed check blood glucose at least TID with CGM,  document, and provide at future appointments  Follow Up Plan: Telephone follow up appointment with care management team member scheduled for: ~ 3 weeks      The patient verbalized understanding of instructions, educational materials, and care plan provided today and declined offer to receive copy of patient instructions, educational materials, and care plan.     Plan: Telephone follow up appointment with care management team member scheduled for: ~3 weeks as previously scheduled  Catie Darnelle Maffucci, PharmD, [place], [place] Pharmacist [facility] [place] [phone]

## 2020-09-25 NOTE — Telephone Encounter (Signed)
Left detailed message on VM with results; okay per DPR. Advised to call back if has any questions or concerns.

## 2020-09-26 ENCOUNTER — Encounter: Payer: Medicare Other | Admitting: Dermatology

## 2020-10-11 ENCOUNTER — Ambulatory Visit: Payer: Medicare Other | Admitting: Pharmacist

## 2020-10-11 DIAGNOSIS — E785 Hyperlipidemia, unspecified: Secondary | ICD-10-CM

## 2020-10-11 DIAGNOSIS — E1165 Type 2 diabetes mellitus with hyperglycemia: Secondary | ICD-10-CM

## 2020-10-11 DIAGNOSIS — I152 Hypertension secondary to endocrine disorders: Secondary | ICD-10-CM

## 2020-10-11 DIAGNOSIS — G720 Drug-induced myopathy: Secondary | ICD-10-CM

## 2020-10-11 DIAGNOSIS — E1159 Type 2 diabetes mellitus with other circulatory complications: Secondary | ICD-10-CM

## 2020-10-11 DIAGNOSIS — Z789 Other specified health status: Secondary | ICD-10-CM

## 2020-10-11 MED ORDER — FREESTYLE LIBRE 2 SENSOR MISC: 11 refills | Status: DC

## 2020-10-11 NOTE — Patient Instructions (Signed)
Visit Information  Patient Care Plan: Medication Management  Completed 10/11/2020  Problem Identified: Diabetes, Hyperlipidemia, Hypertension Resolved 10/11/2020    Long-Range Goal: Disease Management Completed 10/11/2020  This Visit's Progress: On track  Priority: High  Note:   Current Barriers:  . Unable to achieve control of hyperlipidemia  . Unsatisfied with CBG monitoring for diabetes  Pharmacist Clinical Goal(s):  Marland Kitchen Over the next 90 days, patient will achieve adherence to monitoring guidelines and medication adherence to achieve therapeutic efficacy through collaboration with PharmD and provider.   Interventions: . Inter-disciplinary care team collaboration (see longitudinal plan of care) . Comprehensive medication review performed; medication list updated in electronic medical record  Diabetes: . Controlled; current treatment: metformin 7116 mg BID, Trulicity 5.79 mg weekly . Patient is enjoying using Libre 2 sample. Notes that he is better able to review impact of dietary choices through use of CGM . Reviewed goal A1c, goal range 70-180. Patient would like a script sent to the local pharmacy to pay cash price. This was sent . Sent email to connect to [facility] account.  . Continue current regimen at this tim  Hypertension: . Controlled; current treatment: metoprolol succinate 50 mg daily  . Recommended to continue current treatment.  Hyperlipidemia: . Controlled; current treatment: pravastatin 10 mg daily - notes that he has had bilateral muscle aches/pains since restarting pravastatin . Medications previously tried: atorvastatin (notes that he had been on for years and was fine, but then developed "muscle loss", also states some muscle aches. Reports that he also tried rosuvastatin, but is unsure of dose. Similar problems. Ezetimibe 10 mg daily was not beneficial for LDL per patient report; pravastatin 10 mg daily . Discontinue pravastatin. Cardiology already attempted  coverage of bemepdoic acid. Unlikely to qualify for PCSK9i given lack of ASCVD event. Consider referral to [facility] lipid clinic, there may be appropriate clinical trials enrolling that he may qualify for.   GERD: . Controlled per patient report; current treatment: omeprazole 40 mg daily   Allergies: . Controlled per patient report; current treatment: cetirizine 10 mg daily PRN, azelastine intranasal PRN  Patient Goals/Self-Care Activities . Over the next 90 days, patient will:  - take medications as prescribed check blood glucose at least TID with CGM, document, and provide at future appointments  Follow Up Plan: Closing CCM case at this time       The patient verbalized understanding of instructions, educational materials, and care plan provided today and declined offer to receive copy of patient instructions, educational materials, and care plan.  Plan: Closing CCM case at this time due to goals of care being met. Patient has my contact information for future questions or concerns  Catie Darnelle Maffucci, PharmD, [place], [place] Clinical Pharmacist [employer] at [place]

## 2020-10-11 NOTE — Chronic Care Management (AMB) (Signed)
**Note Jackson-Identified via Obfuscation** Chronic Care Management   Pharmacy Note  10/11/2020 Name: Jackson [name] MRN: [id number] DOB: 28-May-1951  Subjective:  Jackson [name] is a 68 y.o. year old male who is a primary care patient of [name], [name], MD. The CCM team was consulted for assistance with chronic disease management and care coordination needs.    Engaged with patient by telephone for follow up visit in response to provider referral for pharmacy case management and/or care coordination services.   Consent to Services:  Jackson [name] was given information about Chronic Care Management services, agreed to services, and gave verbal consent prior to initiation of services on 09/12/20. Please see initial visit note for detailed documentation.   Objective:  Lab Results  Component Value Date   CREATININE 1.34 07/19/2020   CREATININE 1.23 12/12/2019   CREATININE 1.36 06/01/2019    Lab Results  Component Value Date   HGBA1C 6.4 07/19/2020       Component Value Date/Time   CHOL 210 (H) 07/19/2020 0803   TRIG 214.0 (H) 07/19/2020 0803   HDL 28.20 (L) 07/19/2020 0803   CHOLHDL 7 07/19/2020 0803   VLDL 42.8 (H) 07/19/2020 0803   LDLCALC 135 (H) 12/21/2018 0955   LDLDIRECT 155.0 07/19/2020 0803    Clinical ASCVD: No  The 10-year ASCVD risk score Mikey Bussing DC Jr., et al., 2013) is: 32%   Values used to calculate the score:     Age: 52 years     Sex: Male     Is Non-Hispanic African American: No     Diabetic: Yes     Tobacco smoker: No     Systolic Blood Pressure: 428 mmHg     Is BP treated: Yes     HDL Cholesterol: 28.2 mg/dL     Total Cholesterol: 210 mg/dL     BP Readings from Last 3 Encounters:  07/24/20 118/68  03/19/20 128/82  01/02/20 (!) 140/100    Assessment/Interventions: Review of patient past medical history, allergies, medications, health status, including review of consultants reports, laboratory and other test data, was performed as part of comprehensive evaluation and provision of  chronic care management services.   SDOH (Social Determinants of Health) assessments and interventions performed:    CCM Care Plan  Allergies  Allergen Reactions  . Januvia [Sitagliptin]     Kidney pain   . Lipitor [Atorvastatin]     10 mg ?myalgias  . Statins Other (See Comments)    Myalgias    Medications Reviewed Today    Reviewed by Jackson [name], Jackson [name] (Pharmacist) on 10/11/20 at 1013  Med List Status: <None>  Medication Order Taking? Sig Documenting Provider Last Dose Status Informant  azelastine (ASTELIN) 0.1 % nasal spray [id number]  Place 1 spray into both nostrils 2 (two) times daily. Use in each nostril as directed [name], [name], MD  Active   cetirizine (ZYRTEC) 10 MG tablet [id number] Yes Take 10 mg by mouth at bedtime.  [provider] Taking Active   cholecalciferol (VITAMIN D3) 25 MCG (1000 UT) tablet [id number]  Take 1,000 Units by mouth daily. [provider]  Active   Coenzyme Q10 (COQ10) 200 MG CAPS [id number]  Take 200 mg by mouth daily.  [provider]  Active   Continuous Blood Gluc Sensor (FREESTYLE LIBRE 2 SENSOR) [place] [id number]  Use to check glucose Q8H [name], [name], MD  Active   Dulaglutide (TRULICITY) 8.89 VQ/9.4HW SOPN [id number]  Inject 0.5 mLs (0.75 mg total) into the skin once  a week. [name], [name], MD  Active   ketoconazole (NIZORAL) 2 % shampoo [id number]   [provider]  Active   metFORMIN (GLUCOPHAGE) 1000 MG tablet [id number]  Take 1 tablet (1,000 mg total) by mouth 2 (two) times daily with a meal. . [name], [name], MD  Active   metoprolol succinate (TOPROL-XL) 50 MG 24 hr tablet [id number]  Take 1 tablet (50 mg total) by mouth daily. Take with or immediately following a meal. [name], [name], MD  Active   Multiple Vitamins-Minerals (MULTIVITAMIN ADULT PO) [id number]  Take 1 tablet by mouth daily.  [provider]  Active            Med Note [facility]  Graylin Shiver   Tue Jan 02, 2020  3:33 PM)    Omega-3 Fatty Acids (FISH OIL) 1200 MG CPDR [id number]  Take 1 tablet by mouth daily. [provider]  Active   omeprazole (PRILOSEC) 40 MG capsule [id number]  Take 1 capsule (40 mg total) by mouth daily. [name], [name], MD  Active   OVER THE COUNTER MEDICATION [id number]  Take 1 tablet by mouth daily. Osteo biflex  [provider]  Active         Discontinued 10/11/20 1000 (Change in therapy)   triamcinolone ointment (KENALOG) 0.1 % [id number]  Apply 1 application topically daily. As needed and cover with bandaid. Avoid face, groin, underarms. Moye, [place], MD  Active   vitamin C (ASCORBIC ACID) 500 MG tablet [id number]  Take 1,000 mg by mouth daily.  [provider]  Active   vitamin E 100 UNIT capsule [id number]  Take 400 Units by mouth daily.  [provider]  Active           Patient Active Problem List   Diagnosis Date Noted  . Statin intolerance 08/23/2020  . Hypertension associated with diabetes ([place]) 07/24/2020  . Statin myopathy 03/20/2020  . Type 2 diabetes mellitus with hyperglycemia, without long-term current use of insulin ([place]) 12/19/2019  . Coronary artery disease involving native coronary artery of native heart without angina pectoris 12/19/2019  . Elevated PSA 06/13/2019  . Iron deficiency anemia 06/13/2019  . Basal cell carcinoma of skin 03/02/2019  . Melanoma of skin ([place]) 02/21/2019  . AK (actinic keratosis) 11/23/2018  . Vertigo 11/23/2018  . Allergic rhinitis 07/04/2018  . Fatty liver 03/03/2018  . Bradycardia 03/03/2018  . Fatigue 01/23/2018  . Benign prostatic hyperplasia without lower urinary tract symptoms 01/23/2018  . Type 2 diabetes mellitus without complication, without long-term current use of insulin ([place]) 01/23/2018  . Thyroid nodule 01/20/2018  . Hyperlipidemia 11/26/2016  . DM type 2 with diabetic peripheral neuropathy ([place]) 05/27/2016  . Paroxysmal SVT  (supraventricular tachycardia) ([place]) 05/27/2016  . GERD (gastroesophageal reflux disease) 05/27/2016  . OSA (obstructive sleep apnea) 05/27/2016    Conditions to be addressed/monitored per PCP order: HLD and DMII  Patient Care Plan: Medication Management  Completed 10/11/2020  Problem Identified: Diabetes, Hyperlipidemia, Hypertension Resolved 10/11/2020    Long-Range Goal: Disease Management Completed 10/11/2020  This Visit's Progress: On track  Priority: High  Note:   Current Barriers:  . Unable to achieve control of hyperlipidemia  . Unsatisfied with CBG monitoring for diabetes  Pharmacist Clinical Goal(s):  Marland Kitchen Over the next 90 days, patient will achieve adherence to monitoring guidelines and medication adherence to achieve therapeutic efficacy through collaboration with PharmD and provider.   Interventions: . Inter-disciplinary care team collaboration (see longitudinal plan of care) .  Comprehensive medication review performed; medication list updated in electronic medical record  Diabetes: . Controlled; current treatment: metformin 5789 mg BID, Trulicity 7.84 mg weekly . Patient is enjoying using Libre 2 sample. Notes that he is better able to review impact of dietary choices through use of CGM . Reviewed goal A1c, goal range 70-180. Patient would like a script sent to the local pharmacy to pay cash price. This was sent . Sent email to connect to [facility] account.  . Continue current regimen at this tim  Hypertension: . Controlled; current treatment: metoprolol succinate 50 mg daily  . Recommended to continue current treatment.  Hyperlipidemia: . Controlled; current treatment: pravastatin 10 mg daily - notes that he has had bilateral muscle aches/pains since restarting pravastatin . Medications previously tried: atorvastatin (notes that he had been on for years and was fine, but then developed "muscle loss", also states some muscle aches. Reports that he also tried  rosuvastatin, but is unsure of dose. Similar problems. Ezetimibe 10 mg daily was not beneficial for LDL per patient report; pravastatin 10 mg daily . Discontinue pravastatin. Cardiology already attempted coverage of bemepdoic acid. Unlikely to qualify for PCSK9i given lack of ASCVD event. Consider referral to [facility] lipid clinic, there may be appropriate clinical trials enrolling that he may qualify for.   GERD: . Controlled per patient report; current treatment: omeprazole 40 mg daily   Allergies: . Controlled per patient report; current treatment: cetirizine 10 mg daily PRN, azelastine intranasal PRN  Patient Goals/Self-Care Activities . Over the next 90 days, patient will:  - take medications as prescribed check blood glucose at least TID with CGM, document, and provide at future appointments  Follow Up Plan: Closing CCM case at this time      Medication Assistance: None required. Patient affirms current coverage meets needs.   Plan: Closing CCM case at this time due to goals of care being met. Patient has my contact information for future questions or concerns  Catie Darnelle Maffucci, PharmD, [place], [place] Clinical Pharmacist [employer] at [place]

## 2020-10-29 ENCOUNTER — Encounter: Payer: Self-pay | Admitting: Internal Medicine

## 2020-11-05 ENCOUNTER — Ambulatory Visit (INDEPENDENT_AMBULATORY_CARE_PROVIDER_SITE_OTHER): Payer: Medicare Other | Admitting: Dermatology

## 2020-11-05 ENCOUNTER — Other Ambulatory Visit: Payer: Self-pay

## 2020-11-05 ENCOUNTER — Encounter: Payer: Self-pay | Admitting: Dermatology

## 2020-11-05 DIAGNOSIS — Z85828 Personal history of other malignant neoplasm of skin: Secondary | ICD-10-CM

## 2020-11-05 DIAGNOSIS — L820 Inflamed seborrheic keratosis: Secondary | ICD-10-CM

## 2020-11-05 DIAGNOSIS — L578 Other skin changes due to chronic exposure to nonionizing radiation: Secondary | ICD-10-CM

## 2020-11-05 DIAGNOSIS — Z86006 Personal history of melanoma in-situ: Secondary | ICD-10-CM | POA: Diagnosis not present

## 2020-11-05 DIAGNOSIS — Z1283 Encounter for screening for malignant neoplasm of skin: Secondary | ICD-10-CM | POA: Diagnosis not present

## 2020-11-05 DIAGNOSIS — D229 Melanocytic nevi, unspecified: Secondary | ICD-10-CM | POA: Diagnosis not present

## 2020-11-05 DIAGNOSIS — L814 Other melanin hyperpigmentation: Secondary | ICD-10-CM

## 2020-11-05 DIAGNOSIS — D1800 Hemangioma unspecified site: Secondary | ICD-10-CM | POA: Diagnosis not present

## 2020-11-05 DIAGNOSIS — L570 Actinic keratosis: Secondary | ICD-10-CM | POA: Diagnosis not present

## 2020-11-05 NOTE — Patient Instructions (Addendum)
Cryotherapy Aftercare  . Wash gently with soap and water everyday.   Marland Kitchen Apply Vaseline and Band-Aid daily until healed.   Treatment Seborrheic keratoses do not need to be treated, but can easily be removed in the office.  Seborrheic keratoses often cause symptoms when they rub on clothing or jewelry.  Lesions can be in the way of shaving.  If they become inflamed, they can cause itching, soreness, or burning.  Removal of a seborrheic keratosis can be accomplished by freezing, burning, or surgery. If any spot bleeds, scabs, or grows rapidly, please return to have it checked, as these can be an indication of a skin cancer.  Melanoma ABCDEs  Melanoma is the most dangerous type of skin cancer, and is the leading cause of death from skin disease.  You are more likely to develop melanoma if you:  Have light-colored skin, light-colored eyes, or red or blond hair  Spend a lot of time in the sun  Tan regularly, either outdoors or in a tanning bed  Have had blistering sunburns, especially during childhood  Have a close family member who has had a melanoma  Have atypical moles or large birthmarks  Early detection of melanoma is key since treatment is typically straightforward and cure rates are extremely high if we catch it early.   The first sign of melanoma is often a change in a mole or a new dark spot.  The ABCDE system is a way of remembering the signs of melanoma.  A for asymmetry:  The two halves do not match. B for border:  The edges of the growth are irregular. C for color:  A mixture of colors are present instead of an even brown color. D for diameter:  Melanomas are usually (but not always) greater than 69mm - the size of a pencil eraser. E for evolution:  The spot keeps changing in size, shape, and color.  Please check your skin once per month between visits. You can use a small mirror in front and a large mirror behind you to keep an eye on the back side or your body.   If you  see any new or changing lesions before your next follow-up, please call to schedule a visit.  Please continue daily skin protection including broad spectrum sunscreen SPF 30+ to sun-exposed areas, reapplying every 2 hours as needed when you're outdoors.   Recommend taking Heliocare sun protection supplement daily in sunny weather for additional sun protection. For maximum protection on the sunniest days, you can take up to 2 capsules of regular Heliocare OR take 1 capsule of Heliocare Ultra. For prolonged exposure (such as a full day in the sun), you can repeat your dose of the supplement 4 hours after your first dose. Heliocare can be purchased at [facility] or at [email or web address].

## 2020-11-05 NOTE — Progress Notes (Signed)
Follow-Up Visit   Subjective  Jackson [name] is a 70 y.o. male who presents for the following: tbse (Patient here today for full body scan. He states he has an area on left temple he would like checked. He has history of bcc, melanoma on left forearm, and isk. ).  The following portions of the chart were reviewed this encounter and updated as appropriate:  Tobacco  Allergies  Meds  Problems  Med Hx  Surg Hx  Fam Hx       Objective  Well appearing patient in no apparent distress; mood and affect are within normal limits.  A full examination was performed including scalp, head, eyes, ears, nose, lips, neck, chest, axillae, abdomen, back, buttocks, bilateral upper extremities, bilateral lower extremities, hands, feet, fingers, toes, fingernails, and toenails. All findings within normal limits unless otherwise noted below.  Objective  left helix x1, left posterior ear x1, left elbow x1 (3): Erythematous thin papules/macules with gritty scale.   Objective  Left Upper Back: Erythematous keratotic or waxy stuck-on papule or plaque.   Assessment & Plan   Actinic keratosis (3) left helix x1, left posterior ear x1, left elbow x1  Prior to procedure, discussed risks of blister formation, small wound, skin dyspigmentation, or rare scar following cryotherapy.   Recommend daily broad spectrum sunscreen SPF 30+ to sun-exposed areas, reapply every 2 hours as needed. Call for new or changing lesions.    Destruction of lesion - left helix x1, left posterior ear x1, left elbow x1  Destruction method: cryotherapy   Informed consent: discussed and consent obtained   Lesion destroyed using liquid nitrogen: Yes   Cryotherapy cycles:  2 Outcome: patient tolerated procedure well with no complications   Post-procedure details: wound care instructions given    Inflamed seborrheic keratosis Left Upper Back  Bothersome to patient  Prior to procedure, discussed risks of blister formation,  small wound, skin dyspigmentation, or rare scar following cryotherapy.    Destruction of lesion - Left Upper Back  Destruction method: cryotherapy   Informed consent: discussed and consent obtained   Lesion destroyed using liquid nitrogen: Yes   Cryotherapy cycles:  2 Outcome: patient tolerated procedure well with no complications   Post-procedure details: wound care instructions given    Lentigines - Scattered tan macules - Discussed due to sun exposure - Benign, observe - Call for any changes  Seborrheic Keratoses - Stuck-on, waxy, tan-brown papules and plaques  - Discussed benign etiology and prognosis. - Observe - Call for any changes  Melanocytic Nevi - Tan-brown and/or pink-flesh-colored symmetric macules and papules - Benign appearing on exam today - Observation - Call clinic for new or changing moles - Recommend daily use of broad spectrum spf 30+ sunscreen to sun-exposed areas.   Hemangiomas - Red papules - Discussed benign nature - Observe - Call for any changes  Actinic Damage - Severe, chronic, secondary to cumulative UV radiation exposure over time - diffuse scaly erythematous macules and papules with underlying dyspigmentation - Discussed Prescription "Field Treatment" for Severe, Chronic Confluent Actinic Changes with Pre-Cancerous Actinic Keratoses Field treatment involves treatment of an entire area of skin that has confluent Actinic Changes (Sun/ Ultraviolet light damage) and PreCancerous Actinic Keratoses by method of PhotoDynamic Therapy (PDT) and/or prescription Topical Chemotherapy agents such as 5-fluorouracil, 5-fluorouracil/calcipotriene, and/or imiquimod.  The purpose is to decrease the number of clinically evident and subclinical PreCancerous lesions to prevent progression to development of skin cancer by chemically destroying early precancer changes that  may or may not be visible.  It has been shown to reduce the risk of developing skin cancer in  the treated area. As a result of treatment, redness, scaling, crusting, and open sores may occur during treatment course. One or more than one of these methods may be used and may have to be used several times to control, suppress and eliminate the PreCancerous changes. Discussed treatment course, expected reaction, and possible side effects. - Recommend daily broad spectrum sunscreen SPF 30+ to sun-exposed areas, reapply every 2 hours as needed.  - Call for new or changing lesions. - Will schedule prescription Photodynamic Therapy to face and ears.   History of Basal Cell Carcinoma of the Skin - No evidence of recurrence today - Recommend regular full body skin exams - Recommend daily broad spectrum sunscreen SPF 30+ to sun-exposed areas, reapply every 2 hours as needed.  - Call if any new or changing lesions are noted between office visits  History of Melanoma in Situ - No evidence of recurrence today left forearm  - Recommend regular full body skin exams - Recommend daily broad spectrum sunscreen SPF 30+ to sun-exposed areas, reapply every 2 hours as needed.   - Call if any new or changing lesions are noted between office visits   Skin cancer screening performed today.  Return in about 6 months (around 05/05/2021) for TBSE, 2 month Ak follow up, and next available appt for PDT.  I, Ruthell Rummage, CMA, am acting as scribe for Forest Gleason, MD.  Documentation: I have reviewed the above documentation for accuracy and completeness, and I agree with the above.  Forest Gleason, MD

## 2020-11-20 ENCOUNTER — Other Ambulatory Visit: Payer: Self-pay

## 2020-11-20 ENCOUNTER — Ambulatory Visit (INDEPENDENT_AMBULATORY_CARE_PROVIDER_SITE_OTHER): Payer: Medicare Other

## 2020-11-20 DIAGNOSIS — L570 Actinic keratosis: Secondary | ICD-10-CM

## 2020-11-20 MED ORDER — AMINOLEVULINIC ACID HCL 20 % EX SOLR
1.0000 "application " | Freq: Once | CUTANEOUS | Status: AC
  Administered 2020-11-20: 354 mg via TOPICAL

## 2020-11-20 NOTE — Patient Instructions (Signed)

## 2020-11-20 NOTE — Progress Notes (Signed)
1. AK (actinic keratosis) Face and Ears  Photodynamic therapy - Face and Ears Procedure discussed: discussed risks, benefits, side effects. and alternatives   Prep: site scrubbed/prepped with acetone   Location:  Face and ears Number of lesions:  Multiple Type of treatment:  Blue light Aminolevulinic Acid (see MAR for details): Levulan Number of Levulan sticks used:  1 Incubation time (minutes):  60 Number of minutes under lamp:  16 Number of seconds under lamp:  40 Cooling:  Floor fan Outcome: patient tolerated procedure well with no complications   Post-procedure details: sunscreen applied and aftercare instructions given to patient    Aminolevulinic Acid HCl 20 % SOLR 354 mg - Face and Ears

## 2020-12-01 ENCOUNTER — Other Ambulatory Visit: Payer: Self-pay | Admitting: Internal Medicine

## 2020-12-01 DIAGNOSIS — I471 Supraventricular tachycardia: Secondary | ICD-10-CM

## 2020-12-01 DIAGNOSIS — E1165 Type 2 diabetes mellitus with hyperglycemia: Secondary | ICD-10-CM

## 2020-12-10 ENCOUNTER — Other Ambulatory Visit: Payer: Self-pay | Admitting: Internal Medicine

## 2020-12-10 DIAGNOSIS — J309 Allergic rhinitis, unspecified: Secondary | ICD-10-CM

## 2020-12-11 ENCOUNTER — Other Ambulatory Visit: Payer: Self-pay

## 2020-12-11 ENCOUNTER — Other Ambulatory Visit (INDEPENDENT_AMBULATORY_CARE_PROVIDER_SITE_OTHER): Payer: Medicare Other

## 2020-12-11 DIAGNOSIS — I152 Hypertension secondary to endocrine disorders: Secondary | ICD-10-CM

## 2020-12-11 DIAGNOSIS — E1159 Type 2 diabetes mellitus with other circulatory complications: Secondary | ICD-10-CM

## 2020-12-11 DIAGNOSIS — E611 Iron deficiency: Secondary | ICD-10-CM

## 2020-12-11 LAB — CBC WITH DIFFERENTIAL/PLATELET
Basophils Absolute: 0 10*3/uL (ref 0.0–0.1)
Basophils Relative: 0.5 % (ref 0.0–3.0)
Eosinophils Absolute: 0.4 10*3/uL (ref 0.0–0.7)
Eosinophils Relative: 3.8 % (ref 0.0–5.0)
HCT: 43.9 % (ref 39.0–52.0)
Hemoglobin: 14.8 g/dL (ref 13.0–17.0)
Lymphocytes Relative: 33.1 % (ref 12.0–46.0)
Lymphs Abs: 3.2 10*3/uL (ref 0.7–4.0)
MCHC: 33.8 g/dL (ref 30.0–36.0)
MCV: 85.5 fl (ref 78.0–100.0)
Monocytes Absolute: 0.6 10*3/uL (ref 0.1–1.0)
Monocytes Relative: 6.4 % (ref 3.0–12.0)
Neutro Abs: 5.4 10*3/uL (ref 1.4–7.7)
Neutrophils Relative %: 56.2 % (ref 43.0–77.0)
Platelets: 207 10*3/uL (ref 150.0–400.0)
RBC: 5.13 Mil/uL (ref 4.22–5.81)
RDW: 15 % (ref 11.5–15.5)
WBC: 9.7 10*3/uL (ref 4.0–10.5)

## 2020-12-11 LAB — LIPID PANEL
Cholesterol: 211 mg/dL — ABNORMAL HIGH (ref 0–200)
HDL: 31.1 mg/dL — ABNORMAL LOW (ref >39.00)
LDL Cholesterol: 151 mg/dL — ABNORMAL HIGH (ref 0–99)
NonHDL: 180.06
Total CHOL/HDL Ratio: 7
Triglycerides: 147 mg/dL (ref 0.0–149.0)
VLDL: 29.4 mg/dL (ref 0.0–40.0)

## 2020-12-11 LAB — COMPREHENSIVE METABOLIC PANEL
ALT: 34 U/L (ref 0–53)
AST: 22 U/L (ref 0–37)
Albumin: 4.2 g/dL (ref 3.5–5.2)
Alkaline Phosphatase: 41 U/L (ref 39–117)
BUN: 15 mg/dL (ref 6–23)
CO2: 30 mEq/L (ref 19–32)
Calcium: 9.3 mg/dL (ref 8.4–10.5)
Chloride: 101 mEq/L (ref 96–112)
Creatinine, Ser: 1.3 mg/dL (ref 0.40–1.50)
GFR: 56.52 mL/min — ABNORMAL LOW (ref >60.00)
Glucose, Bld: 95 mg/dL (ref 70–99)
Potassium: 4.4 mEq/L (ref 3.5–5.1)
Sodium: 139 mEq/L (ref 135–145)
Total Bilirubin: 0.4 mg/dL (ref 0.2–1.2)
Total Protein: 6.6 g/dL (ref 6.0–8.3)

## 2020-12-11 LAB — HEMOGLOBIN A1C: Hgb A1c MFr Bld: 6.8 % — ABNORMAL HIGH (ref 4.6–6.5)

## 2020-12-11 LAB — MICROALBUMIN / CREATININE URINE RATIO
Creatinine,U: 118.6 mg/dL
Microalb Creat Ratio: 0.6 mg/g (ref 0.0–30.0)
Microalb, Ur: <0.7 mg/dL (ref 0.0–1.9)

## 2020-12-12 LAB — IRON,TIBC AND FERRITIN PANEL
%SAT: 13 % (calc) — ABNORMAL LOW (ref 20–48)
Ferritin: 19 ng/mL — ABNORMAL LOW (ref 24–380)
Iron: 51 ug/dL (ref 50–180)
TIBC: 407 mcg/dL (calc) (ref 250–425)

## 2020-12-18 ENCOUNTER — Ambulatory Visit (INDEPENDENT_AMBULATORY_CARE_PROVIDER_SITE_OTHER): Payer: Medicare Other | Admitting: Internal Medicine

## 2020-12-18 ENCOUNTER — Other Ambulatory Visit: Payer: Self-pay

## 2020-12-18 ENCOUNTER — Encounter: Payer: Self-pay | Admitting: Internal Medicine

## 2020-12-18 VITALS — BP 130/76 | HR 61 | Temp 98.0°F | Ht 73.0 in | Wt 238.6 lb

## 2020-12-18 DIAGNOSIS — Z23 Encounter for immunization: Secondary | ICD-10-CM

## 2020-12-18 DIAGNOSIS — E538 Deficiency of other specified B group vitamins: Secondary | ICD-10-CM | POA: Diagnosis not present

## 2020-12-18 DIAGNOSIS — R5383 Other fatigue: Secondary | ICD-10-CM

## 2020-12-18 DIAGNOSIS — E1159 Type 2 diabetes mellitus with other circulatory complications: Secondary | ICD-10-CM

## 2020-12-18 DIAGNOSIS — Z1329 Encounter for screening for other suspected endocrine disorder: Secondary | ICD-10-CM | POA: Diagnosis not present

## 2020-12-18 DIAGNOSIS — I152 Hypertension secondary to endocrine disorders: Secondary | ICD-10-CM | POA: Diagnosis not present

## 2020-12-18 DIAGNOSIS — E559 Vitamin D deficiency, unspecified: Secondary | ICD-10-CM

## 2020-12-18 DIAGNOSIS — E785 Hyperlipidemia, unspecified: Secondary | ICD-10-CM

## 2020-12-18 DIAGNOSIS — R972 Elevated prostate specific antigen [PSA]: Secondary | ICD-10-CM

## 2020-12-18 DIAGNOSIS — H9209 Otalgia, unspecified ear: Secondary | ICD-10-CM

## 2020-12-18 DIAGNOSIS — G720 Drug-induced myopathy: Secondary | ICD-10-CM

## 2020-12-18 DIAGNOSIS — E611 Iron deficiency: Secondary | ICD-10-CM

## 2020-12-18 DIAGNOSIS — Z789 Other specified health status: Secondary | ICD-10-CM

## 2020-12-18 DIAGNOSIS — E291 Testicular hypofunction: Secondary | ICD-10-CM | POA: Diagnosis not present

## 2020-12-18 DIAGNOSIS — M545 Low back pain, unspecified: Secondary | ICD-10-CM

## 2020-12-18 DIAGNOSIS — R937 Abnormal findings on diagnostic imaging of other parts of musculoskeletal system: Secondary | ICD-10-CM

## 2020-12-18 DIAGNOSIS — T466X5A Adverse effect of antihyperlipidemic and antiarteriosclerotic drugs, initial encounter: Secondary | ICD-10-CM

## 2020-12-18 DIAGNOSIS — G8929 Other chronic pain: Secondary | ICD-10-CM

## 2020-12-18 NOTE — Patient Instructions (Addendum)
B12 1000 mcg daily over the counter   Pneumococcal Polysaccharide Vaccine (PPSV23): What You Need to Know 1. Why get vaccinated? Pneumococcal polysaccharide vaccine (PPSV23) can prevent pneumococcal disease. Pneumococcal disease refers to any illness caused by pneumococcal bacteria. These bacteria can cause many types of illnesses, including pneumonia, which is an infection of the lungs. Pneumococcal bacteria are one of the most common causes of pneumonia. Besides pneumonia, pneumococcal bacteria can also cause:  Ear infections  Sinus infections  Meningitis (infection of the tissue covering the brain and spinal cord)  Bacteremia (bloodstream infection) Anyone can get pneumococcal disease, but children under 93 years of age, people with certain medical conditions, adults 13 years or older, and cigarette smokers are at the highest risk. Most pneumococcal infections are mild. However, some can result in long-term problems, such as brain damage or hearing loss. Meningitis, bacteremia, and pneumonia caused by pneumococcal disease can be fatal. 2. PPSV23 PPSV23 protects against 23 types of bacteria that cause pneumococcal disease. PPSV23 is recommended for:  All adults 37 years or older,  Anyone 2 years or older with certain medical conditions that can lead to an increased risk for pneumococcal disease. Most people need only one dose of PPSV23. A second dose of PPSV23, and another type of pneumococcal vaccine called PCV13, are recommended for certain high-risk groups. Your health care provider can give you more information. People 65 years or older should get a dose of PPSV23 even if they have already gotten one or more doses of the vaccine before they turned 39. 3. Talk with your health care provider Tell your vaccine provider if the person getting the vaccine:  Has had an allergic reaction after a previous dose of PPSV23, or has any severe, life-threatening allergies. In some cases, your  health care provider may decide to postpone PPSV23 vaccination to a future visit. People with minor illnesses, such as a cold, may be vaccinated. People who are moderately or severely ill should usually wait until they recover before getting PPSV23. Your health care provider can give you more information. 4. Risks of a vaccine reaction  Redness or pain where the shot is given, feeling tired, fever, or muscle aches can happen after PPSV23. People sometimes faint after medical procedures, including vaccination. Tell your provider if you feel dizzy or have vision changes or ringing in the ears. As with any medicine, there is a very remote chance of a vaccine causing a severe allergic reaction, other serious injury, or death. 5. What if there is a serious problem? An allergic reaction could occur after the vaccinated person leaves the clinic. If you see signs of a severe allergic reaction (hives, swelling of the face and throat, difficulty breathing, a fast heartbeat, dizziness, or weakness), call 9-1-1 and get the person to the nearest hospital. For other signs that concern you, call your health care provider. Adverse reactions should be reported to the Vaccine Adverse Event Reporting System (VAERS). Your health care provider will usually file this report, or you can do it yourself. Visit the VAERS website at www.vaers.SamedayNews.es or call 908-750-4802. VAERS is only for reporting reactions, and VAERS staff do not give medical advice. 6. How can I learn more?  Ask your health care provider.  Call your local or state health department.  Contact the Centers for Disease Control and Prevention (CDC): ? Call 548-752-5980 (1-800-CDC-INFO) or ? Visit CDC's website at http://hunter.com/ Vaccine Information Statement PPSV23 Vaccine (08/24/2018) This information is not intended to replace advice given to you  by your health care provider. Make sure you discuss any questions you have with your health care  provider. Document Revised: 06/14/2020 Document Reviewed: 06/14/2020 Elsevier Patient Education  2021 [place].  Iron Deficiency Anemia, Adult Iron deficiency anemia is when you do not have enough red blood cells or hemoglobin in your blood. This happens because you have too little iron in your body. Hemoglobin carries oxygen to parts of the body. Anemia can cause your body to not get enough oxygen. What are the causes?  Not eating enough foods that have iron in them.  The body not being able to take in iron well.  Needing more iron due to pregnancy or heavy menstrual periods, for females.  Cancer.  Bleeding in the bowels.  Many blood draws. What increases the risk?  Being pregnant.  Being a teenage girl going through a growth spurt. What are the signs or symptoms?  Pale skin, lips, and nails.  Weakness, dizziness, and getting tired easily.  Headache.  Feeling like you cannot breathe well when moving (shortness of breath).  Cold hands and feet.  Fast heartbeat or a heartbeat that is not regular.  Feeling grouchy (irritable) or breathing fast. These are more common in very bad anemia. Mild anemia may not cause any symptoms. How is this treated? This condition is treated by finding out why you do not have enough iron and then getting more iron. It may include:  Adding foods to your diet that have a lot of iron.  Taking iron pills (supplements). If you are pregnant or breastfeeding, you may need to take extra iron. Your diet often does not provide the amount of iron that you need.  Getting more vitamin C in your diet. Vitamin C helps your body take in iron. You may need to take iron pills with a glass of orange juice or vitamin C pills.  Medicines to make heavy menstrual periods lighter.  Surgery. You may need blood tests to see if treatment is working. If the treatment does not seem to be working, you may need more tests. Follow these instructions at  home: Medicines  Take over-the-counter and prescription medicines only as told by your doctor. This includes iron pills and vitamins. ? Take iron pills when your stomach is empty. If you cannot handle this, take them with food. ? Do not drink milk or take antacids at the same time as your iron pills. ? Iron pills may turn your poop (stool)black.  If you cannot handle taking iron pills by mouth, ask your doctor about getting iron through: ? An IV tube. ? A shot (injection) into a muscle. Eating and drinking  Talk with your doctor before changing the foods you eat. He or she may tell you to eat foods that have a lot of iron, such as: ? Liver. ? Low-fat (lean) beef. ? Breads and cereals that have iron added to them. ? Eggs. ? Dried fruit. ? Dark green, leafy vegetables.  Eat fresh fruits and vegetables that are high in vitamin C. They help your body use iron. Foods with a lot of vitamin C include: ? Oranges. ? Peppers. ? Tomatoes. ? Mangoes.  Drink enough fluid to keep your pee (urine) pale yellow.   Managing constipation If you are taking iron pills, they may cause trouble pooping (constipation). To prevent or treat trouble pooping, you may need to:  Take over-the-counter or prescription medicines.  Eat foods that are high in fiber. These include beans, whole grains, and  fresh fruits and vegetables.  Limit foods that are high in fat and sugar. These include fried or sweet foods. General instructions  Return to your normal activities as told by your doctor. Ask your doctor what activities are safe for you.  Keep yourself clean, and keep things clean around you.  Keep all follow-up visits as told by your doctor. This is important. Contact a doctor if:  You feel like you may vomit (nauseous), or you vomit.  You feel weak.  You are sweating for no reason.  You have trouble pooping, such as: ? Pooping less than 3 times a week. ? Straining to poop. ? Having poop that is  hard, dry, or larger than normal. ? Feeling full or bloated. ? Pain in the lower belly. ? Not feeling better after pooping. Get help right away if:  You pass out (faint).  You have chest pain.  You have trouble breathing that: ? Is very bad. ? Gets worse with physical activity.  You have a fast heartbeat, or a heartbeat that does not feel regular.  You get light-headed when getting up from sitting or lying down. These symptoms may be an emergency. Do not wait to see if the symptoms will go away. Get medical help right away. Call your local emergency services (911 in the [place] Do not drive yourself to the hospital. Summary  Iron deficiency anemia is when you have too little iron in your body.  This condition is treated by finding out why you do not have enough iron in your body and then getting more iron.  Take over-the-counter and prescription medicines only as told by your doctor.  Eat fresh fruits and vegetables that are high in vitamin C.  Get help right away if you cannot breathe well. This information is not intended to replace advice given to you by your health care provider. Make sure you discuss any questions you have with your health care provider. Document Revised: 06/20/2019 Document Reviewed: 06/20/2019 Elsevier Patient Education  [place].

## 2020-12-18 NOTE — Progress Notes (Addendum)
Chief Complaint  Patient presents with   Follow-up   F/u  1. C/o fatigue though sleeping 7-8 hours reviewed labs iron def which is intermittent and chronic if this continues will refer GI for egd/colonoscopy  2. See above Also PSA increasing currently 12/20/20 4.1 >4.0. he is established with urology  05/26/2016 16:13 PSA: 3.41  05/26/2017 14:16 PSA: 3.01  06/20/2018 07:57 PSA: 2.92  06/01/2019 09:10 PSA: 4.93 (H) Results for Jackson, [name]" (MRN [id number]) as of 12/30/2020 13:50  07/10/2019 15:13 Prostate Specific Ag, Serum: 4.3 (H)  12/26/2019 14:19 Prostate Specific Ag, Serum: 3.5  12/20/2020 13:49 Prostate Specific Ag, Serum: 4.1 (H)  3. DM 2 A1c on metformin 9242 mg bid and trulicity 6.83 weekly 02/11/6221 08:03 Hemoglobin A1C: 6.8 (H) Will given pna 23 vaccine today  Review of Systems  Constitutional: Positive for malaise/fatigue. Negative for weight loss.  HENT: Negative for hearing loss.   Eyes: Negative for blurred vision.  Respiratory: Negative for shortness of breath.   Cardiovascular: Negative for chest pain.  Gastrointestinal: Negative for abdominal pain.  Musculoskeletal: Negative for falls and joint pain.  Skin: Negative for rash.  Psychiatric/Behavioral: Negative for memory loss.   Past Medical History:  Diagnosis Date   Actinic keratosis    Allergy    Anemia    Arthritis    Basal cell carcinoma    CAD (coronary artery disease)    Cancer (HCC)    BASAL CELL-FACE   Complication of anesthesia    PT IS A RED HEAD AND STATES IT TAKES ALOT MORE ANESTHESIA TO SEDATE HIM   Diabetes mellitus without complication (HCC)    Dysrhythmia    SVT   Fatty liver    GERD (gastroesophageal reflux disease)    Hyperlipidemia    Melanoma ([place]) 02/02/2019   Left forearm. Superficial spreading. Tumor thickness 0.40m, anatomic level III. Excised: 02/22/2019, margins free.   Multiple thyroid nodules    Rheumatic fever    Sleep apnea 1997   Uses C-Pap machine   SVT  (supraventricular tachycardia) (HCC)    Thyroid nodule    Past Surgical History:  Procedure Laterality Date   APPENDECTOMY  1965   BICEPS TENDON REPAIR Right 05/2015   COLONOSCOPY  2016   LUMBAR FUSION  2015   L4/L5   RHINOPLASTY  1997   TONSILLECTOMY  1956   VASECTOMY  1981   Family History  Problem Relation Age of Onset   Arthritis Mother    Lung cancer Mother    Hyperlipidemia Mother    Diabetes Mother    Arthritis Father    Diabetes Father    Lung disease Father    Hyperlipidemia Maternal Grandmother    Alcohol abuse Maternal Grandfather    Hyperlipidemia Maternal Grandfather    Drug abuse Cousin    Hyperlipidemia Other        maternal side of family    Social History   Socioeconomic History   Marital status: Married    Spouse name: Not on file   Number of children: Not on file   Years of education: Not on file   Highest education level: Not on file  Occupational History   Not on file  Tobacco Use   Smoking status: Never Smoker   Smokeless tobacco: Former USystems developer   Types: Snuff  Vaping Use   Vaping Use: Never used  Substance and Sexual Activity   Alcohol use: Yes    Alcohol/week: 1.0 - 2.0 standard drink  Types: 1 - 2 Cans of beer per week    Comment: OCC   Drug use: No   Sexual activity: Not Currently  Other Topics Concern   Not on file  Social History Narrative   Married    Former Nature conservation officer    2 daughters   Social Determinants of Radio broadcast assistant Strain: Low Risk    Difficulty of Paying Living Expenses: Not hard at all  Food Insecurity: No Food Insecurity   Worried About Charity fundraiser in the Last Year: Never true   Arboriculturist in the Last Year: Never true  Transportation Needs: No Transportation Needs   Lack of Transportation (Medical): No   Lack of Transportation (Non-Medical): No  Physical Activity: Not on file  Stress: No Stress Concern Present   Feeling of Stress : Not at all  Social Connections: Unknown    Frequency of Communication with Friends and Family: Not on file   Frequency of Social Gatherings with Friends and Family: Not on file   Attends Religious Services: Not on file   Active Member of Clubs or Organizations: Not on file   Attends Archivist Meetings: Not on file   Marital Status: Married  Human resources officer Violence: Not At Risk   Fear of Current or Ex-Partner: No   Emotionally Abused: No   Physically Abused: No   Sexually Abused: No   Current Meds  Medication Sig   azelastine (ASTELIN) 0.1 % nasal spray USE 1 SPRAY IN EACH NOSTRIL TWICE A DAY AS DIRECTED   cetirizine (ZYRTEC) 10 MG tablet Take 10 mg by mouth at bedtime.    cholecalciferol (VITAMIN D3) 25 MCG (1000 UT) tablet Take 1,000 Units by mouth daily.   Coenzyme Q10 (COQ10) 200 MG CAPS Take 200 mg by mouth daily.    Continuous Blood Gluc Sensor (FREESTYLE LIBRE 2 SENSOR) MISC Use to check glucose Q8H   ketoconazole (NIZORAL) 2 % shampoo    metFORMIN (GLUCOPHAGE) 1000 MG tablet Take 1 tablet (1,000 mg total) by mouth 2 (two) times daily with a meal. .   metoprolol succinate (TOPROL-XL) 50 MG 24 hr tablet TAKE 1 TABLET DAILY WITH OR IMMEDIATELY FOLLOWING A MEAL (DOSE CHANGE)   Multiple Vitamins-Minerals (MULTIVITAMIN ADULT PO) Take 1 tablet by mouth daily.    Omega-3 Fatty Acids (FISH OIL) 1200 MG CPDR Take 1 tablet by mouth daily.   omeprazole (PRILOSEC) 40 MG capsule Take 1 capsule (40 mg total) by mouth daily.   OVER THE COUNTER MEDICATION Take 1 tablet by mouth daily. Osteo biflex   triamcinolone ointment (KENALOG) 0.1 % Apply 1 application topically daily. As needed and cover with bandaid. Avoid face, groin, underarms.   TRULICITY 4.09 WJ/1.9JY SOPN INJECT 0.5 ML (0.75 MG TOTAL) UNDER THE SKIN ONCE A WEEK   vitamin C (ASCORBIC ACID) 500 MG tablet Take 1,000 mg by mouth daily.    vitamin E 100 UNIT capsule Take 400 Units by mouth daily.    Allergies  Allergen Reactions   Januvia [Sitagliptin]     Kidney  pain    Lipitor [Atorvastatin]     10 mg ?myalgias   Statins Other (See Comments)    Myalgias; atorvastatin 10 mg, pravastatin 10 mg, rosuvastatin w/ unknown dose.    Recent Results (from the past 2160 hour(s))  Microalbumin / creatinine urine ratio     Status: None   Collection Time: 12/11/20  8:03 AM  Result Value Ref Range  Microalb, Ur <0.7 0.0 - 1.9 mg/dL   Creatinine,U 118.6 mg/dL   Microalb Creat Ratio 0.6 0.0 - 30.0 mg/g  Iron, TIBC and Ferritin Panel     Status: Abnormal   Collection Time: 12/11/20  8:03 AM  Result Value Ref Range   Iron 51 50 - 180 mcg/dL   TIBC 407 250 - 425 mcg/dL (calc)   %SAT 13 (L) 20 - 48 % (calc)   Ferritin 19 (L) 24 - 380 ng/mL  Hemoglobin A1c     Status: Abnormal   Collection Time: 12/11/20  8:03 AM  Result Value Ref Range   Hgb A1c MFr Bld 6.8 (H) 4.6 - 6.5 %    Comment: Glycemic Control Guidelines for People with Diabetes:Non Diabetic:  <6%Goal of Therapy: <7%Additional Action Suggested:  >8%   Lipid panel     Status: Abnormal   Collection Time: 12/11/20  8:03 AM  Result Value Ref Range   Cholesterol 211 (H) 0 - 200 mg/dL    Comment: ATP III Classification       Desirable:  < 200 mg/dL               Borderline High:  200 - 239 mg/dL          High:  > = 240 mg/dL   Triglycerides 147.0 0.0 - 149.0 mg/dL    Comment: Normal:  <150 mg/dLBorderline High:  150 - 199 mg/dL   HDL 31.10 (L) >39.00 mg/dL   VLDL 29.4 0.0 - 40.0 mg/dL   LDL Cholesterol 151 (H) 0 - 99 mg/dL   Total CHOL/HDL Ratio 7     Comment:                Men          Women1/2 Average Risk     3.4          3.3Average Risk          5.0          4.42X Average Risk          9.6          7.13X Average Risk          15.0          11.0                       NonHDL 180.06     Comment: NOTE:  Non-HDL goal should be 30 mg/dL higher than patient's LDL goal (i.e. LDL goal of < 70 mg/dL, would have non-HDL goal of < 100 mg/dL)  CBC with Differential/Platelet     Status: None   Collection  Time: 12/11/20  8:03 AM  Result Value Ref Range   WBC 9.7 4.0 - 10.5 K/uL   RBC 5.13 4.22 - 5.81 Mil/uL   Hemoglobin 14.8 13.0 - 17.0 g/dL   HCT 43.9 39.0 - 52.0 %   MCV 85.5 78.0 - 100.0 fl   MCHC 33.8 30.0 - 36.0 g/dL   RDW 15.0 11.5 - 15.5 %   Platelets 207.0 150.0 - 400.0 K/uL   Neutrophils Relative % 56.2 43.0 - 77.0 %   Lymphocytes Relative 33.1 12.0 - 46.0 %   Monocytes Relative 6.4 3.0 - 12.0 %   Eosinophils Relative 3.8 0.0 - 5.0 %   Basophils Relative 0.5 0.0 - 3.0 %   Neutro Abs 5.4 1.4 - 7.7 K/uL   Lymphs Abs 3.2 0.7 - 4.0 K/uL  Monocytes Absolute 0.6 0.1 - 1.0 K/uL   Eosinophils Absolute 0.4 0.0 - 0.7 K/uL   Basophils Absolute 0.0 0.0 - 0.1 K/uL  Comprehensive metabolic panel     Status: Abnormal   Collection Time: 12/11/20  8:03 AM  Result Value Ref Range   Sodium 139 135 - 145 mEq/L   Potassium 4.4 3.5 - 5.1 mEq/L   Chloride 101 96 - 112 mEq/L   CO2 30 19 - 32 mEq/L   Glucose, Bld 95 70 - 99 mg/dL   BUN 15 6 - 23 mg/dL   Creatinine, Ser 1.30 0.40 - 1.50 mg/dL   Total Bilirubin 0.4 0.2 - 1.2 mg/dL   Alkaline Phosphatase 41 39 - 117 U/L   AST 22 0 - 37 U/L   ALT 34 0 - 53 U/L   Total Protein 6.6 6.0 - 8.3 g/dL   Albumin 4.2 3.5 - 5.2 g/dL   GFR 56.52 (L) >60.00 mL/min    Comment: Calculated using the CKD-EPI Creatinine Equation (2021)   Calcium 9.3 8.4 - 10.5 mg/dL  PSA Total (Reflex To Free)     Status: Abnormal   Collection Time: 12/20/20  1:49 PM  Result Value Ref Range   Prostate Specific Ag, Serum 4.1 (H) 0.0 - 4.0 ng/mL    Comment: Roche ECLIA methodology. According to the American Urological Association, Serum PSA should decrease and remain at undetectable levels after radical prostatectomy. The AUA defines biochemical recurrence as an initial PSA value 0.2 ng/mL or greater followed by a subsequent confirmatory PSA value 0.2 ng/mL or greater. Values obtained with different assay methods or kits cannot be used interchangeably. Results cannot be  interpreted as absolute evidence of the presence or absence of malignant disease.    Reflex Criteria Comment     Comment: The percent free PSA is performed on a reflex basis only when the total PSA is between 4.0 and 10.0 ng/mL.   %fPSA Reflex     Status: None   Collection Time: 12/20/20  1:49 PM  Result Value Ref Range   PSA, FREE 0.91 N/A ng/mL    Comment: Roche ECLIA methodology.   % FREE PSA 22.2 %    Comment: The table below lists the probability of prostate cancer for men with non-suspicious DRE results and total PSA between 4 and 10 ng/mL, by patient age Jackson [name], Jackson [name], Jackson [name]).                   % Free PSA       50-64 yr        65-75 yr                   0.00-10.00%        56%             55%                  10.01-15.00%        24%             35%                  15.01-20.00%        17%             23%                  20.01-25.00%        10%  20%                       >25.00%         5%              9% Please note:  Catalona et al did not make specific               recommendations regarding the use of               percent free PSA for any other population               of men.   HM DIABETES EYE EXAM     Status: None   Collection Time: 12/26/20 12:00 AM  Result Value Ref Range   HM Diabetic Eye Exam No Retinopathy No Retinopathy    Comment: AE 12/26/20   Objective  Body mass index is 31.48 kg/m. Wt Readings from Last 3 Encounters:  12/18/20 238 lb 9.6 oz (108.2 kg)  09/09/20 225 lb (102.1 kg)  07/24/20 225 lb (102.1 kg)   Temp Readings from Last 3 Encounters:  12/18/20 98 F (36.7 C) (Oral)  03/19/20 97.7 F (36.5 C) (Temporal)  12/05/19 98.5 F (36.9 C) (Oral)   BP Readings from Last 3 Encounters:  12/18/20 130/76  07/24/20 118/68  03/19/20 128/82   Pulse Readings from Last 3 Encounters:  12/18/20 61  03/19/20 (!) 56  01/02/20 65    Physical Exam Vitals and nursing note reviewed.  Constitutional:      Appearance:  Normal appearance. He is well-developed and well-groomed. He is obese.  HENT:     Head: Normocephalic and atraumatic.  Eyes:     Conjunctiva/sclera: Conjunctivae normal.     Pupils: Pupils are equal, round, and reactive to light.  Cardiovascular:     Rate and Rhythm: Normal rate and regular rhythm.     Heart sounds: Normal heart sounds. No murmur heard.   Pulmonary:     Effort: Pulmonary effort is normal.     Breath sounds: Normal breath sounds.  Abdominal:     Tenderness: There is no abdominal tenderness.  Skin:    General: Skin is warm and dry.  Neurological:     General: No focal deficit present.     Mental Status: He is alert and oriented to person, place, and time. Mental status is at baseline.     Gait: Gait normal.  Psychiatric:        Attention and Perception: Attention and perception normal.        Mood and Affect: Mood and affect normal.        Speech: Speech normal.        Behavior: Behavior normal. Behavior is cooperative.        Thought Content: Thought content normal.        Cognition and Memory: Cognition and memory normal.        Judgment: Judgment normal.     Assessment  Plan  Fatigue,?iron def other other supplemental def, ? Due to elevated PSA he has OSA on cpap/bipap - Plan: Comprehensive metabolic panel, Lipid panel, CBC w/Diff, TSH, Urinalysis, Routine w reflex microscopic If iron def continues consider GI for EGD/colonoscopy   Hypertension associated with diabetes ([place]) - Plan: Comprehensive metabolic panel, Lipid panel, CBC w/Diff, Urinalysis, Routine w reflex microscopic, Hemoglobin A1c HLD (h/o statin myopathy/intolerance)  On toprol xl 50 mg qd  trulicity 0.5 weekly  Metformin 1000  mg bid  Unable to tolerate statin rec healthy diet and exercise  Foot exam and pna 23 vaccine done today   Elevated PSA - Plan: Urinalysis, Routine w reflex microscopic F/u with urology   Chronic back pain with abnormal imaging 03/2020 s/p back surgery As of  04/10/21 Dr. Remo Lipps cook NS rec PM&R referral  Will do this IMPRESSION: 1. Posterior lumbar interbody fusion and decompression at L4-5 without foraminal or central canal stenosis. 2. At L5-S1 there is a broad-based disc bulge. Mild bilateral facet arthropathy. No left foraminal stenosis. Moderate right foraminal stenosis. 3.  No acute osseous injury of the lumbar spine.     Electronically Signed   By: Kathreen Devoid   On: 04/05/2020 06:59  HM Reviewed labs 05/2020  Flu shot utd  Tdap utd Had prevnar  pna 23 vaccine given today 3/3 covid vaccines  Consider shingrix in future given Rx prev and he has declines vaccine twinrix 3/3 immune MMR immune   PSA had 07/10/2019 4.3 elevated then 12/26/19 3.5 estab urology Dr. Diamantina Providence f/u 01/16/21 1pm Results for Jackson, [name]" (MRN [id number]) as of 12/30/2020 13:50  Ref. Range 07/10/2019 15:13 12/26/2019 14:19 12/20/2020 13:49  Prostate Specific Ag, Serum Latest Ref Range: 0.0 - 4.0 ng/mL 4.3 (H) 3.5 4.1 (H)   PSA sch this week 12/20/20    Zoster had 03/13/15, declines shingrix    hep C neg   Derm Dr. Laurence Ferrari LN2 arms, scalp due 08/2019 with h/o melanoma and seen 2021  Saw 11/05/20 f/u 05/05/21   Colonoscopy 10/17/15 normal fair prep consider repeat in 5 years 09/2020  no FH colon cancer  Pt wanted to do cologuard 09/05/20 negative  With iron def consider EGD/colonoscopy   rec healthy diet and exercise    CT chest had 04/06/18  CT CHEST WITHOUT CONTRAST   TECHNIQUE: Multidetector CT imaging of the chest was performed following the standard protocol without IV contrast.   COMPARISON:  None.   FINDINGS: Cardiovascular: Normal heart size. Trace pericardial thickening/fluid. Normal caliber thoracic aorta. Minimal atherosclerotic calcification of the aortic valve.   Mediastinum/Nodes: No enlarged mediastinal or axillary lymph nodes. Enlarged, nodular right thyroid lobe with coarse calcifications. The trachea and esophagus demonstrate  no significant findings.   Lungs/Pleura: No focal consolidation, pleural effusion, or pneumothorax. Small focal areas of scarring in the lingula and left lower lobe. No suspicious pulmonary nodule.   Upper Abdomen: No acute abnormality. 2.0 cm simple cyst in the midpole of the right kidney. Subcentimeter lesions in the upper poles of both kidneys are too small to characterize.   Musculoskeletal: No chest wall mass or suspicious bone lesions identified.   IMPRESSION: 1.  No acute intrathoracic process. 2. Enlarged, nodular right thyroid lobe. Please see thyroid ultrasound from same day for further evaluation.   US thyroid 04/06/18 bx kc endo 04/2019 benign f/u 05/01/20 repeat thyroid US and 05/08/20 f/u Dr. Honor Junes    IMPRESSION: 1. Mild thyromegaly with bilateral nodules. 2. Recommend FNA biopsy of suspicious 2.5 cm inferior right AND moderately suspicious 2.8 cm mid right nodules. 3. Recommend annual/biennial ultrasound follow-up of additional lesions as above until stability x5 years confirmed Thyroid bx 04/2019 benign   Sleep study 09/14/18 sleep med mild OSA total AHI 10 per hr, cpap titration/bipap 9 cm h20 but turned down to 8  -rec wt loss , nasal congestion, CPAP at 8 cm h20 he also had high periodic limb movement index with only few arousals ?RLS +/- insomnia    -  CPAP 8 cm H20 humidified, [place] Full face mask large measures to maintain airway address nasal congestion   Provider: Dr. Olivia Mackie [name]-Internal Medicine

## 2020-12-19 DIAGNOSIS — E119 Type 2 diabetes mellitus without complications: Secondary | ICD-10-CM | POA: Diagnosis not present

## 2020-12-19 LAB — HM DIABETES EYE EXAM

## 2020-12-20 ENCOUNTER — Other Ambulatory Visit: Payer: Self-pay

## 2020-12-20 ENCOUNTER — Other Ambulatory Visit: Payer: Medicare Other

## 2020-12-20 DIAGNOSIS — R972 Elevated prostate specific antigen [PSA]: Secondary | ICD-10-CM | POA: Diagnosis not present

## 2020-12-21 LAB — PSA TOTAL (REFLEX TO FREE): Prostate Specific Ag, Serum: 4.1 ng/mL — ABNORMAL HIGH (ref 0.0–4.0)

## 2020-12-21 LAB — FPSA% REFLEX
% FREE PSA: 22.2 %
PSA, FREE: 0.91 ng/mL

## 2020-12-24 ENCOUNTER — Encounter: Payer: Self-pay | Admitting: Internal Medicine

## 2020-12-26 ENCOUNTER — Encounter: Payer: Medicare Other | Admitting: Dermatology

## 2020-12-26 ENCOUNTER — Ambulatory Visit: Payer: Self-pay | Admitting: Urology

## 2020-12-26 LAB — HM DIABETES EYE EXAM

## 2020-12-27 ENCOUNTER — Encounter: Payer: Self-pay | Admitting: Internal Medicine

## 2020-12-30 DIAGNOSIS — E611 Iron deficiency: Secondary | ICD-10-CM | POA: Insufficient documentation

## 2021-01-06 DIAGNOSIS — M9902 Segmental and somatic dysfunction of thoracic region: Secondary | ICD-10-CM | POA: Diagnosis not present

## 2021-01-06 DIAGNOSIS — M62830 Muscle spasm of back: Secondary | ICD-10-CM | POA: Diagnosis not present

## 2021-01-06 DIAGNOSIS — M9903 Segmental and somatic dysfunction of lumbar region: Secondary | ICD-10-CM | POA: Diagnosis not present

## 2021-01-06 DIAGNOSIS — R293 Abnormal posture: Secondary | ICD-10-CM | POA: Diagnosis not present

## 2021-01-07 DIAGNOSIS — J3489 Other specified disorders of nose and nasal sinuses: Secondary | ICD-10-CM | POA: Diagnosis not present

## 2021-01-07 DIAGNOSIS — J342 Deviated nasal septum: Secondary | ICD-10-CM | POA: Diagnosis not present

## 2021-01-09 DIAGNOSIS — M62830 Muscle spasm of back: Secondary | ICD-10-CM | POA: Diagnosis not present

## 2021-01-09 DIAGNOSIS — M9902 Segmental and somatic dysfunction of thoracic region: Secondary | ICD-10-CM | POA: Diagnosis not present

## 2021-01-09 DIAGNOSIS — R293 Abnormal posture: Secondary | ICD-10-CM | POA: Diagnosis not present

## 2021-01-09 DIAGNOSIS — M9903 Segmental and somatic dysfunction of lumbar region: Secondary | ICD-10-CM | POA: Diagnosis not present

## 2021-01-13 DIAGNOSIS — M9903 Segmental and somatic dysfunction of lumbar region: Secondary | ICD-10-CM | POA: Diagnosis not present

## 2021-01-13 DIAGNOSIS — R293 Abnormal posture: Secondary | ICD-10-CM | POA: Diagnosis not present

## 2021-01-13 DIAGNOSIS — M9902 Segmental and somatic dysfunction of thoracic region: Secondary | ICD-10-CM | POA: Diagnosis not present

## 2021-01-13 DIAGNOSIS — M62830 Muscle spasm of back: Secondary | ICD-10-CM | POA: Diagnosis not present

## 2021-01-15 DIAGNOSIS — M62830 Muscle spasm of back: Secondary | ICD-10-CM | POA: Diagnosis not present

## 2021-01-15 DIAGNOSIS — M9902 Segmental and somatic dysfunction of thoracic region: Secondary | ICD-10-CM | POA: Diagnosis not present

## 2021-01-15 DIAGNOSIS — R293 Abnormal posture: Secondary | ICD-10-CM | POA: Diagnosis not present

## 2021-01-15 DIAGNOSIS — M9903 Segmental and somatic dysfunction of lumbar region: Secondary | ICD-10-CM | POA: Diagnosis not present

## 2021-01-16 ENCOUNTER — Other Ambulatory Visit: Payer: Self-pay

## 2021-01-16 ENCOUNTER — Encounter: Payer: Self-pay | Admitting: Urology

## 2021-01-16 ENCOUNTER — Ambulatory Visit (INDEPENDENT_AMBULATORY_CARE_PROVIDER_SITE_OTHER): Payer: Medicare Other | Admitting: Urology

## 2021-01-16 VITALS — BP 132/80 | HR 77 | Ht 73.0 in | Wt 231.9 lb

## 2021-01-16 DIAGNOSIS — N400 Enlarged prostate without lower urinary tract symptoms: Secondary | ICD-10-CM | POA: Diagnosis not present

## 2021-01-16 DIAGNOSIS — Z125 Encounter for screening for malignant neoplasm of prostate: Secondary | ICD-10-CM | POA: Diagnosis not present

## 2021-01-16 DIAGNOSIS — R972 Elevated prostate specific antigen [PSA]: Secondary | ICD-10-CM | POA: Diagnosis not present

## 2021-01-16 LAB — BLADDER SCAN AMB NON-IMAGING

## 2021-01-16 MED ORDER — TAMSULOSIN HCL 0.4 MG PO CAPS: 0.4000 mg | ORAL_CAPSULE | Freq: Every day | ORAL | 11 refills | Status: DC

## 2021-01-16 NOTE — Patient Instructions (Signed)

## 2021-01-16 NOTE — Progress Notes (Signed)
   01/16/2021 3:36 PM   [name] April 16, 1951 [id number]  Reason for visit: Follow up BPH, PSA screening  HPI: He is a 70 year old relatively healthy male who we have followed for BPH and mildly elevated PSA.  PSAs in the past have ranged from 3-4.9 over the last few years, and has been essentially stable around 4 with reassuring > 20% free.  Most recent PSA on 12/20/2020 was stable at 4.1 with 22% free.  DRE has been benign in the past.  We discussed the risks and benefits of PSA screening, and the AUA guidelines recommending PSA every 1 to 2 years.  He is in agreement to continue screening.  His primary issue today is worsening of his urinary symptoms with feeling of incomplete emptying and weak stream.  IPSS score today is 11, with quality of life mixed.  PVR is elevated at 267 mL.  We discussed options at length including a trial of medical management with Flomax versus more aggressive work-up with cystoscopy and TRUS.  He would like to start with Flomax.  Risks and benefits discussed.  -Trial of Flomax for urinary symptoms -Continue yearly PSA screening -RTC 6 to 8 weeks for IPSS, PVR, symptom check.  If persistent symptoms and elevated PVRs, re-discuss cystoscopy and TRUS for consideration of outlet procedures  Billey Co, MD  [facility] [address] [place], [place] [address] [phone]

## 2021-01-20 DIAGNOSIS — M62830 Muscle spasm of back: Secondary | ICD-10-CM | POA: Diagnosis not present

## 2021-01-20 DIAGNOSIS — R293 Abnormal posture: Secondary | ICD-10-CM | POA: Diagnosis not present

## 2021-01-20 DIAGNOSIS — M9902 Segmental and somatic dysfunction of thoracic region: Secondary | ICD-10-CM | POA: Diagnosis not present

## 2021-01-20 DIAGNOSIS — M9903 Segmental and somatic dysfunction of lumbar region: Secondary | ICD-10-CM | POA: Diagnosis not present

## 2021-01-22 DIAGNOSIS — M9902 Segmental and somatic dysfunction of thoracic region: Secondary | ICD-10-CM | POA: Diagnosis not present

## 2021-01-22 DIAGNOSIS — M62830 Muscle spasm of back: Secondary | ICD-10-CM | POA: Diagnosis not present

## 2021-01-22 DIAGNOSIS — M9903 Segmental and somatic dysfunction of lumbar region: Secondary | ICD-10-CM | POA: Diagnosis not present

## 2021-01-22 DIAGNOSIS — R293 Abnormal posture: Secondary | ICD-10-CM | POA: Diagnosis not present

## 2021-01-27 DIAGNOSIS — M9903 Segmental and somatic dysfunction of lumbar region: Secondary | ICD-10-CM | POA: Diagnosis not present

## 2021-01-27 DIAGNOSIS — M9902 Segmental and somatic dysfunction of thoracic region: Secondary | ICD-10-CM | POA: Diagnosis not present

## 2021-01-27 DIAGNOSIS — M62830 Muscle spasm of back: Secondary | ICD-10-CM | POA: Diagnosis not present

## 2021-01-27 DIAGNOSIS — R293 Abnormal posture: Secondary | ICD-10-CM | POA: Diagnosis not present

## 2021-01-28 ENCOUNTER — Other Ambulatory Visit: Payer: Self-pay

## 2021-01-28 DIAGNOSIS — L280 Lichen simplex chronicus: Secondary | ICD-10-CM

## 2021-01-28 MED ORDER — TRIAMCINOLONE ACETONIDE 0.1 % EX OINT: 1.0000 "application " | TOPICAL_OINTMENT | Freq: Every day | CUTANEOUS | 0 refills | Status: DC

## 2021-01-28 NOTE — Progress Notes (Signed)
Triamcinolone RF approval per [facility]

## 2021-01-29 DIAGNOSIS — R293 Abnormal posture: Secondary | ICD-10-CM | POA: Diagnosis not present

## 2021-01-29 DIAGNOSIS — M9902 Segmental and somatic dysfunction of thoracic region: Secondary | ICD-10-CM | POA: Diagnosis not present

## 2021-01-29 DIAGNOSIS — M62830 Muscle spasm of back: Secondary | ICD-10-CM | POA: Diagnosis not present

## 2021-01-29 DIAGNOSIS — M9903 Segmental and somatic dysfunction of lumbar region: Secondary | ICD-10-CM | POA: Diagnosis not present

## 2021-01-31 ENCOUNTER — Other Ambulatory Visit: Payer: Self-pay

## 2021-01-31 ENCOUNTER — Ambulatory Visit (INDEPENDENT_AMBULATORY_CARE_PROVIDER_SITE_OTHER): Payer: Medicare Other | Admitting: Physician Assistant

## 2021-01-31 ENCOUNTER — Encounter: Payer: Self-pay | Admitting: Physician Assistant

## 2021-01-31 VITALS — BP 122/62 | HR 67 | Ht 73.0 in | Wt 230.0 lb

## 2021-01-31 DIAGNOSIS — Z789 Other specified health status: Secondary | ICD-10-CM | POA: Diagnosis not present

## 2021-01-31 DIAGNOSIS — E7849 Other hyperlipidemia: Secondary | ICD-10-CM | POA: Diagnosis not present

## 2021-01-31 DIAGNOSIS — I2510 Atherosclerotic heart disease of native coronary artery without angina pectoris: Secondary | ICD-10-CM

## 2021-01-31 DIAGNOSIS — I471 Supraventricular tachycardia: Secondary | ICD-10-CM

## 2021-01-31 NOTE — Patient Instructions (Addendum)
Medication Instructions:  Your physician recommends that you continue on your current medications as directed. Please refer to the Current Medication list given to you today.  *If you need a refill on your cardiac medications before your next appointment, please call your pharmacy*   Lab Work: None ordered  Testing/Procedures: None ordered   Follow-Up: At [facility], you and your health needs are our priority.  As part of our continuing mission to provide you with exceptional heart care, we have created designated Provider Care Teams.  These Care Teams include your primary Cardiologist (physician) and Advanced Practice Providers (APPs -  Physician Assistants and Nurse Practitioners) who all work together to provide you with the care you need, when you need it.  We recommend signing up for the patient portal called "MyChart".  Sign up information is provided on this After Visit Summary.  MyChart is used to connect with patients for Virtual Visits (Telemedicine).  Patients are able to view lab/test results, encounter notes, upcoming appointments, etc.  Non-urgent messages can be sent to your provider as well.   To learn more about what you can do with MyChart, go to [email or web address].    Your next appointment:   1 year(s)  The format for your next appointment:   In Person  Provider:   You may see Dr. Rockey Situ or one of the following Advanced Practice Providers on your designated Care Team:    Murray Hodgkins, NP  Christell Faith, PA-C  Marrianne Mood, PA-C  Cadence [place], [place]  Laurann [place], NP    Other Instructions You have been referred to the [facility]. You will receive a call to schedule your appointment.

## 2021-01-31 NOTE — Progress Notes (Signed)
Office Visit    Patient Name: Jackson [name] Date of Encounter: 01/31/2021  PCP:  [name], [name], MD   [place]  Cardiologist:  Ida Rogue, MD  Advanced Practice Provider:  No care team member to display Electrophysiologist:  None :[id number]}   Chief Complaint    Chief Complaint  Patient presents with  . Annual Exam    Pt states he has been doing well from a cardiac standpoint     Past Medical History    Past Medical History:  Diagnosis Date  . Actinic keratosis   . Allergy   . Anemia   . Arthritis   . Basal cell carcinoma   . CAD (coronary artery disease)   . Cancer (HCC)    BASAL CELL-FACE  . Complication of anesthesia    PT IS A RED HEAD AND STATES IT TAKES ALOT MORE ANESTHESIA TO SEDATE HIM  . Diabetes mellitus without complication ([place])   . Dysrhythmia    SVT  . Fatty liver   . GERD (gastroesophageal reflux disease)   . Hyperlipidemia   . Melanoma ([place]) 02/02/2019   Left forearm. Superficial spreading. Tumor thickness 0.30mm, anatomic level III. Excised: 02/22/2019, margins free.  . Multiple thyroid nodules   . Rheumatic fever   . Sleep apnea 1997   Uses C-Pap machine  . SVT (supraventricular tachycardia) ([place])   . Thyroid nodule    Past Surgical History:  Procedure Laterality Date  . APPENDECTOMY  1965  . BICEPS TENDON REPAIR Right 05/2015  . COLONOSCOPY  2016  . LUMBAR FUSION  2015   L4/L5  . RHINOPLASTY  1997  . TONSILLECTOMY  1956  . VASECTOMY  1981    Allergies  Allergies  Allergen Reactions  . Januvia [Sitagliptin]     Kidney pain   . Lipitor [Atorvastatin]     10 mg ?myalgias  . Statins Other (See Comments)    Myalgias; atorvastatin 10 mg, pravastatin 10 mg, rosuvastatin w/ unknown dose.     History of Present Illness    Jackson [name] is a 70 y.o. male with PMH significant for hyperlipidemia, DM2, SVT, and followed by [facility] cardiology/Dr. Rockey Situ.  He was last seen in office 01/02/2020.   At that time, he reported doing very well.  SBP 120s at home.  On metoprolol 25 mg, SVT returned with ventricular rates into the 150s with subsequent increase back to metoprolol 50 mg daily.  He had a history of several episodes of SVT while riding a bike.  On higher doses of metoprolol, he denied significant tachypalpitations.  He denied a regular exercise program and dietary indiscretion was noted with total cholesterol 196 and LDL 135.  On review of EMR, it was noted there was a long discussion regarding hyperlipidemia.  He declined Zetia at that time.  He reported an intolerance to statins, specifically joint and muscle pain.  EKG NSR.  Today, 01/31/2021, he returns to clinic and notes he is overall doing well from a cardiac standpoint.  He denies any chest pain, racing heart rate, palpitations, presyncope, syncope, or falls.  No signs or symptoms of bleeding.  He denies any signs or symptoms of volume overload including orthopnea, PND, early satiety, abdominal distention, pitting edema.  He reports a desire to start biking, as he has been limited by pain.  Lifestyle reviewed with patient relatively active at baseline and heart healthy diet discussed.  Home Medications    Current Outpatient Medications on  File Prior to Visit  Medication Sig Dispense Refill  . azelastine (ASTELIN) 0.1 % nasal spray USE 1 SPRAY IN EACH NOSTRIL TWICE A DAY AS DIRECTED 90 mL 3  . cetirizine (ZYRTEC) 10 MG tablet Take 10 mg by mouth at bedtime.     . cholecalciferol (VITAMIN D3) 25 MCG (1000 UT) tablet Take 1,000 Units by mouth daily.    . Coenzyme Q10 (COQ10) 200 MG CAPS Take 200 mg by mouth daily.     . Continuous Blood Gluc Sensor (FREESTYLE LIBRE 2 SENSOR) MISC Use to check glucose Q8H 2 each 11  . ketoconazole (NIZORAL) 2 % shampoo     . metFORMIN (GLUCOPHAGE) 1000 MG tablet Take 1 tablet (1,000 mg total) by mouth 2 (two) times daily with a meal. . 180 tablet 3  . metoprolol succinate (TOPROL-XL) 50 MG 24 hr tablet  TAKE 1 TABLET DAILY WITH OR IMMEDIATELY FOLLOWING A MEAL (DOSE CHANGE) 90 tablet 3  . Multiple Vitamins-Minerals (MULTIVITAMIN ADULT PO) Take 1 tablet by mouth daily.     . Omega-3 Fatty Acids (FISH OIL) 1200 MG CPDR Take 1 tablet by mouth daily.    Marland Kitchen omeprazole (PRILOSEC) 40 MG capsule Take 1 capsule (40 mg total) by mouth daily. 90 capsule 3  . OVER THE COUNTER MEDICATION Take 1 tablet by mouth daily. Osteo biflex    . tamsulosin (FLOMAX) 0.4 MG CAPS capsule Take 1 capsule (0.4 mg total) by mouth daily. 30 capsule 11  . triamcinolone ointment (KENALOG) 0.1 % Apply 1 application topically daily. As needed and cover with bandaid. Avoid face, groin, underarms. 80 g 0  . TRULICITY 6.76 HM/0.9OB SOPN INJECT 0.5 ML (0.75 MG TOTAL) UNDER THE SKIN ONCE A WEEK 6 mL 3  . vitamin C (ASCORBIC ACID) 500 MG tablet Take 1,000 mg by mouth daily.     . vitamin E 100 UNIT capsule Take 400 Units by mouth daily.      No current facility-administered medications on file prior to visit.    Review of Systems    He denies chest pain, palpitations, dyspnea, pnd, orthopnea, n, v, dizziness, syncope, edema, weight gain due to volume, or early satiety.    All other systems reviewed and are otherwise negative except as noted above.  Physical Exam    VS:  BP 122/62   Pulse 67   Ht 6\' 1"  (1.854 m)   Wt 230 lb (104.3 kg)   BMI 30.34 kg/m  , BMI Body mass index is 30.34 kg/m. GEN: Well nourished, well developed, in no acute distress. HEENT: normal. Neck: Supple, no JVD, carotid bruits, or masses. Cardiac: RRR, no murmurs, rubs, or gallops. No clubbing, cyanosis, edema.  Radials/DP/PT 2+ and equal bilaterally.  Respiratory:  Respirations regular and unlabored, clear to auscultation bilaterally. GI: Soft, nontender, nondistended, BS + x 4. MS: no deformity or atrophy. Skin: warm and dry, no rash. Neuro:  Strength and sensation are intact. Psych: Normal affect.  Accessory Clinical Findings    ECG personally  reviewed by me today -NSR, 67 bpm, prolonged PR interval at 194 ms- no acute changes.  VITALS Reviewed today   Temp Readings from Last 3 Encounters:  12/18/20 98 F (36.7 C) (Oral)  03/19/20 97.7 F (36.5 C) (Temporal)  12/05/19 98.5 F (36.9 C) (Oral)   BP Readings from Last 3 Encounters:  01/31/21 122/62  01/16/21 132/80  12/18/20 130/76   Pulse Readings from Last 3 Encounters:  01/31/21 67  01/16/21 77  12/18/20 61  Wt Readings from Last 3 Encounters:  01/31/21 230 lb (104.3 kg)  01/16/21 231 lb 14.4 oz (105.2 kg)  12/18/20 238 lb 9.6 oz (108.2 kg)     LABS  reviewed today   Lab Results  Component Value Date   WBC 9.7 12/11/2020   HGB 14.8 12/11/2020   HCT 43.9 12/11/2020   MCV 85.5 12/11/2020   PLT 207.0 12/11/2020   Lab Results  Component Value Date   CREATININE 1.30 12/11/2020   BUN 15 12/11/2020   NA 139 12/11/2020   K 4.4 12/11/2020   CL 101 12/11/2020   CO2 30 12/11/2020   Lab Results  Component Value Date   ALT 34 12/11/2020   AST 22 12/11/2020   ALKPHOS 41 12/11/2020   BILITOT 0.4 12/11/2020   Lab Results  Component Value Date   CHOL 211 (H) 12/11/2020   HDL 31.10 (L) 12/11/2020   LDLCALC 151 (H) 12/11/2020   LDLDIRECT 155.0 07/19/2020   TRIG 147.0 12/11/2020   CHOLHDL 7 12/11/2020    Lab Results  Component Value Date   HGBA1C 6.8 (H) 12/11/2020   Lab Results  Component Value Date   TSH 1.66 07/19/2020     STUDIES/PROCEDURES reviewed today   No CV studies  Assessment & Plan    Paroxysmal SVT --Denies increased tachypalpitations.  Ventricular rate and symptoms well controlled on current metoprolol.  Continue.  HLD --Reports intolerance to statins.  Has declined Zetia.  As previously noted in EMR, he is not a candidate for PCSK9 inhibitors in the past due to no evidence of coronary artery disease and no documented PAD.  Today he reports a family history of elevated cholesterol into the 300s/familial hyperlipidemia.  Given  this new finding, will refer to lipid clinic.  Also discussed with bempedoic acid/Nexletol and Vascepa.  Will provide lipid clinic referral today.  DM2 --Glycemic control.  Heart healthy diet.  Ongoing activity as tolerated.   Disposition: RTC 1 year, referral to lipid clinic given report of family history of lipids into the [address]    Arvil Chaco, PA-C 01/31/2021

## 2021-02-16 ENCOUNTER — Encounter: Payer: Self-pay | Admitting: Internal Medicine

## 2021-02-17 DIAGNOSIS — R937 Abnormal findings on diagnostic imaging of other parts of musculoskeletal system: Secondary | ICD-10-CM | POA: Insufficient documentation

## 2021-02-17 NOTE — Addendum Note (Signed)
Addended by: Orland Mustard on: 02/17/2021 02:32 PM   Modules accepted: Orders

## 2021-02-18 ENCOUNTER — Other Ambulatory Visit: Payer: Self-pay | Admitting: Internal Medicine

## 2021-02-18 DIAGNOSIS — K219 Gastro-esophageal reflux disease without esophagitis: Secondary | ICD-10-CM

## 2021-02-27 ENCOUNTER — Encounter: Payer: Self-pay | Admitting: Emergency Medicine

## 2021-02-27 ENCOUNTER — Ambulatory Visit
Admission: EM | Admit: 2021-02-27 | Discharge: 2021-02-27 | Disposition: A | Discharge status: Home / Self Care | Payer: Medicare Other | Attending: Emergency Medicine | Admitting: Emergency Medicine

## 2021-02-27 ENCOUNTER — Other Ambulatory Visit: Payer: Self-pay

## 2021-02-27 DIAGNOSIS — H6692 Otitis media, unspecified, left ear: Secondary | ICD-10-CM

## 2021-02-27 MED ORDER — AMOXICILLIN 875 MG PO TABS: 875.0000 mg | ORAL_TABLET | Freq: Two times a day (BID) | ORAL | 0 refills | Status: DC

## 2021-02-27 NOTE — ED Provider Notes (Signed)
Jackson [name]    CSN: [id number] Arrival date & time: 02/27/21  1225      History   Chief Complaint Chief Complaint  Patient presents with  . Otalgia    HPI Jackson [name] is a 70 y.o. male.   Patient presents with 1 week history of left ear pain.  He denies drainage, fever, chills, congestion, sore throat, cough, shortness of breath, or other symptoms.  Treatment attempted at home with Debrox.  Patient's medical history includes diabetes, hypertension, CAD, SVT, anemia, GERD.  The history is provided by the patient and medical records.    Past Medical History:  Diagnosis Date  . Actinic keratosis   . Allergy   . Anemia   . Arthritis   . Basal cell carcinoma   . CAD (coronary artery disease)   . Cancer (HCC)    BASAL CELL-FACE  . Complication of anesthesia    PT IS A RED HEAD AND STATES IT TAKES ALOT MORE ANESTHESIA TO SEDATE HIM  . Diabetes mellitus without complication ([place])   . Dysrhythmia    SVT  . Fatty liver   . GERD (gastroesophageal reflux disease)   . Hyperlipidemia   . Melanoma ([place]) 02/02/2019   Left forearm. Superficial spreading. Tumor thickness 0.28mm, anatomic level III. Excised: 02/22/2019, margins free.  . Multiple thyroid nodules   . Rheumatic fever   . Sleep apnea 1997   Uses C-Pap machine  . SVT (supraventricular tachycardia) ([place])   . Thyroid nodule     Patient Active Problem List   Diagnosis Date Noted  . Abnormal MRI, lumbar spine 02/17/2021  . Iron deficiency 12/30/2020  . Statin intolerance 08/23/2020  . Hypertension associated with diabetes ([place]) 07/24/2020  . Statin myopathy 03/20/2020  . Type 2 diabetes mellitus with hyperglycemia, without long-term current use of insulin ([place]) 12/19/2019  . Coronary artery disease involving native coronary artery of native heart without angina pectoris 12/19/2019  . Elevated PSA 06/13/2019  . Iron deficiency anemia 06/13/2019  . Basal cell carcinoma of skin 03/02/2019  . Melanoma of skin  ([place]) 02/21/2019  . AK (actinic keratosis) 11/23/2018  . Vertigo 11/23/2018  . Allergic rhinitis 07/04/2018  . Fatty liver 03/03/2018  . Bradycardia 03/03/2018  . Fatigue 01/23/2018  . Benign prostatic hyperplasia without lower urinary tract symptoms 01/23/2018  . Type 2 diabetes mellitus without complication, without long-term current use of insulin ([place]) 01/23/2018  . Thyroid nodule 01/20/2018  . Hyperlipidemia 11/26/2016  . DM type 2 with diabetic peripheral neuropathy ([place]) 05/27/2016  . Paroxysmal SVT (supraventricular tachycardia) ([place]) 05/27/2016  . GERD (gastroesophageal reflux disease) 05/27/2016  . OSA (obstructive sleep apnea) 05/27/2016    Past Surgical History:  Procedure Laterality Date  . APPENDECTOMY  1965  . BICEPS TENDON REPAIR Right 05/2015  . COLONOSCOPY  2016  . LUMBAR FUSION  2015   L4/L5  . RHINOPLASTY  1997  . TONSILLECTOMY  1956  . VASECTOMY  1981       Home Medications    Prior to Admission medications   Medication Sig Start Date End Date Taking? Authorizing Provider  amoxicillin (AMOXIL) 875 MG tablet Take 1 tablet (875 mg total) by mouth 2 (two) times daily for 7 days. 02/27/21 03/06/21 Yes Sharion Balloon, NP  azelastine (ASTELIN) 0.1 % nasal spray USE 1 SPRAY IN EACH NOSTRIL TWICE A DAY AS DIRECTED 12/10/20  Yes [name], [name], MD  cetirizine (ZYRTEC) 10 MG tablet Take 10 mg by mouth at bedtime.  Yes [provider]  cholecalciferol (VITAMIN D3) 25 MCG (1000 UT) tablet Take 1,000 Units by mouth daily.   Yes [provider]  Continuous Blood Gluc Sensor (FREESTYLE LIBRE 2 SENSOR) MISC Use to check glucose Q8H 10/11/20  Yes [name], Pasty Spillersracy N, MD  metFORMIN (GLUCOPHAGE) 1000 MG tablet Take 1 tablet (1,000 mg total) by mouth 2 (two) times daily with a meal. . 03/19/20  Yes [name], Pasty Spillersracy N, MD  metoprolol succinate (TOPROL-XL) 50 MG 24 hr tablet TAKE 1 TABLET DAILY WITH OR IMMEDIATELY FOLLOWING A MEAL (DOSE  CHANGE) 12/02/20  Yes [name], Pasty Spillersracy N, MD  Multiple Vitamins-Minerals (MULTIVITAMIN ADULT PO) Take 1 tablet by mouth daily.    Yes [provider]  Omega-3 Fatty Acids (FISH OIL) 1200 MG CPDR Take 1 tablet by mouth daily.   Yes [provider]  omeprazole (PRILOSEC) 40 MG capsule TAKE 1 CAPSULE DAILY 02/18/21  Yes [name], Pasty Spillersracy N, MD  OVER THE COUNTER MEDICATION Take 1 tablet by mouth daily. Osteo biflex   Yes [provider]  tamsulosin (FLOMAX) 0.4 MG CAPS capsule Take 1 capsule (0.4 mg total) by mouth daily. 01/16/21  Yes Sondra ComeSninsky, Brian C, MD  TRULICITY 0.75 MG/0.5ML SOPN INJECT 0.5 ML (0.75 MG TOTAL) UNDER THE SKIN ONCE A WEEK 12/02/20  Yes [name], Pasty Spillersracy N, MD  vitamin C (ASCORBIC ACID) 500 MG tablet Take 1,000 mg by mouth daily.    Yes [provider]  vitamin E 100 UNIT capsule Take 400 Units by mouth daily.    Yes [provider]  Coenzyme Q10 (COQ10) 200 MG CAPS Take 200 mg by mouth daily.     [provider]  ketoconazole (NIZORAL) 2 % shampoo  10/13/18   [provider]  triamcinolone ointment (KENALOG) 0.1 % Apply 1 application topically daily. As needed and cover with bandaid. Avoid face, groin, underarms. 01/28/21   Sandi MealyMoye, [place], MD    Family History Family History  Problem Relation Age of Onset  . Arthritis Mother   . Lung cancer Mother   . Hyperlipidemia Mother   . Diabetes Mother   . Arthritis Father   . Diabetes Father   . Lung disease Father   . Hyperlipidemia Maternal Grandmother   . Alcohol abuse Maternal Grandfather   . Hyperlipidemia Maternal Grandfather   . Drug abuse Cousin   . Hyperlipidemia Other        maternal side of family     Social History Social History   Tobacco Use  . Smoking status: Never Smoker  . Smokeless tobacco: Former NeurosurgeonUser    Types: Snuff  Vaping Use  . Vaping Use: Never used  Substance Use Topics  . Alcohol use: Yes    Alcohol/week: 1.0 - 2.0  standard drink    Types: 1 - 2 Cans of beer per week    Comment: OCC  . Drug use: No     Allergies   Januvia [sitagliptin], Lipitor [atorvastatin], and Statins   Review of Systems Review of Systems  Constitutional: Negative for chills and fever.  HENT: Positive for ear pain. Negative for sore throat.   Respiratory: Negative for cough and shortness of breath.   Cardiovascular: Negative for chest pain and palpitations.  Gastrointestinal: Negative for abdominal pain and vomiting.  Skin: Negative for color change and rash.  All other systems reviewed and are negative.    Physical Exam Triage Vital Signs ED Triage Vitals  Enc Vitals Group     BP  Pulse      Resp      Temp      Temp src      SpO2      Weight      Height      Head Circumference      Peak Flow      Pain Score      Pain Loc      Pain Edu?      Excl. in [place]?    No data found.  Updated Vital Signs BP 140/83 (BP Location: Left Arm)   Pulse 70   Temp 98.9 F (37.2 C) (Oral)   Resp 20   SpO2 96%   Visual Acuity Right Eye Distance:   Left Eye Distance:   Bilateral Distance:    Right Eye Near:   Left Eye Near:    Bilateral Near:     Physical Exam Vitals and nursing note reviewed.  Constitutional:      General: He is not in acute distress.    Appearance: He is well-developed. He is not ill-appearing.  HENT:     Head: Normocephalic and atraumatic.     Right Ear: Tympanic membrane and ear canal normal.     Left Ear: Ear canal normal. Tympanic membrane is erythematous.     Nose: Nose normal.     Mouth/Throat:     Mouth: Mucous membranes are moist.     Pharynx: Oropharynx is clear.  Eyes:     Conjunctiva/sclera: Conjunctivae normal.  Cardiovascular:     Rate and Rhythm: Normal rate and regular rhythm.     Heart sounds: Normal heart sounds.  Pulmonary:     Effort: Pulmonary effort is normal. No respiratory distress.     Breath sounds: Normal breath sounds.  Abdominal:     Palpations:  Abdomen is soft.     Tenderness: There is no abdominal tenderness.  Musculoskeletal:     Cervical back: Neck supple.  Skin:    General: Skin is warm and dry.  Neurological:     General: No focal deficit present.     Mental Status: He is alert and oriented to person, place, and time.     Gait: Gait normal.  Psychiatric:        Mood and Affect: Mood normal.        Behavior: Behavior normal.      UC Treatments / Results  Labs (all labs ordered are listed, but only abnormal results are displayed) Labs Reviewed - No data to display  EKG   Radiology No results found.  Procedures Procedures (including critical care time)  Medications Ordered in UC Medications - No data to display  Initial Impression / Assessment and Plan / UC Course  I have reviewed the triage vital signs and the nursing notes.  Pertinent labs & imaging results that were available during my care of the patient were reviewed by me and considered in my medical decision making (see chart for details).   Left otitis media.  Treating with amoxicillin.  Discussed Tylenol as needed for discomfort.  Instructed him to follow-up with his PCP if his symptoms are not improving.  He agrees to plan of care.   Final Clinical Impressions(s) / UC Diagnoses   Final diagnoses:  Left otitis media, unspecified otitis media type     Discharge Instructions     Take the amoxicillin as directed.    Follow up with your primary care provider if your symptoms are not improving.  ED Prescriptions    Medication Sig Dispense Auth. Provider   amoxicillin (AMOXIL) 875 MG tablet Take 1 tablet (875 mg total) by mouth 2 (two) times daily for 7 days. 14 tablet Sharion Balloon, NP     PDMP not reviewed this encounter.   Sharion Balloon, NP 02/27/21 [phone]

## 2021-02-27 NOTE — ED Triage Notes (Signed)
Left ear pain that started one week ago.  Denies runny nose or cough or fever.  Patient does wear hearing aids.  Patient has used debrox, no results

## 2021-02-27 NOTE — Discharge Instructions (Signed)
Take the amoxicillin as directed.  Follow up with your primary care provider if your symptoms are not improving.   ° ° °

## 2021-03-03 ENCOUNTER — Other Ambulatory Visit: Payer: Self-pay | Admitting: Internal Medicine

## 2021-03-03 ENCOUNTER — Encounter: Payer: Self-pay | Admitting: Internal Medicine

## 2021-03-03 DIAGNOSIS — E1165 Type 2 diabetes mellitus with hyperglycemia: Secondary | ICD-10-CM

## 2021-03-04 ENCOUNTER — Other Ambulatory Visit: Payer: Self-pay | Admitting: Internal Medicine

## 2021-03-04 DIAGNOSIS — M5136 Other intervertebral disc degeneration, lumbar region: Secondary | ICD-10-CM | POA: Diagnosis not present

## 2021-03-04 DIAGNOSIS — M5416 Radiculopathy, lumbar region: Secondary | ICD-10-CM | POA: Diagnosis not present

## 2021-03-04 DIAGNOSIS — H6692 Otitis media, unspecified, left ear: Secondary | ICD-10-CM

## 2021-03-04 MED ORDER — CIPROFLOXACIN-DEXAMETHASONE 0.3-0.1 % OT SUSP: 4.0000 [drp] | Freq: Two times a day (BID) | OTIC | 0 refills | Status: DC

## 2021-03-06 ENCOUNTER — Other Ambulatory Visit: Payer: Self-pay

## 2021-03-06 ENCOUNTER — Ambulatory Visit (INDEPENDENT_AMBULATORY_CARE_PROVIDER_SITE_OTHER): Payer: Medicare Other | Admitting: Urology

## 2021-03-06 VITALS — BP 139/81 | HR 67 | Ht 73.0 in | Wt 230.0 lb

## 2021-03-06 DIAGNOSIS — N400 Enlarged prostate without lower urinary tract symptoms: Secondary | ICD-10-CM

## 2021-03-06 DIAGNOSIS — N189 Chronic kidney disease, unspecified: Secondary | ICD-10-CM

## 2021-03-06 DIAGNOSIS — R339 Retention of urine, unspecified: Secondary | ICD-10-CM | POA: Diagnosis not present

## 2021-03-06 LAB — BLADDER SCAN AMB NON-IMAGING

## 2021-03-06 NOTE — Progress Notes (Signed)
   03/06/2021 2:04 PM   [name] [birth year] [id number]  Reason for visit: Follow up BPH, PSA screening  HPI: 70 year old male with a long history of mildly elevated PSA ranging from 3-4.9 over the last few years and has been essentially stable with a reassuring > 20% free.  DRE has been benign.  At our last visit in March 2022 he was having worsening urinary symptoms with feeling of incomplete emptying and weak stream.  PVR was elevated at 267 mL, and he opted for a trial of Flomax at that time.  He reports some improvement in his urinary symptoms, but PVR remains elevated today at 350 mL.  He voided at home prior to getting to clinic, and does not have the urge to void at this time.  We discussed options at length including further evaluation with cystoscopy and TRUS and consideration of outlet procedures, versus continued close observation with Flomax and follow-up renal ultrasound.  He is minimally bothered by his urinary symptoms and IPSS score today is 8, with quality of life mostly satisfied.  We discussed possible risks of long-term bladder outlet obstruction including acute urinary retention, UTIs, or renal impairment, atonic bladder/bladder damage.  He understands these risk.  Return precautions discussed extensively.  RTC 6 months with renal ultrasound prior and PVR Continue Flomax   Billey Co, MD  [facility] [address] [place], [place] [address] [phone]

## 2021-03-11 ENCOUNTER — Other Ambulatory Visit (INDEPENDENT_AMBULATORY_CARE_PROVIDER_SITE_OTHER): Payer: Medicare Other

## 2021-03-11 ENCOUNTER — Other Ambulatory Visit: Payer: Self-pay

## 2021-03-11 DIAGNOSIS — E1159 Type 2 diabetes mellitus with other circulatory complications: Secondary | ICD-10-CM | POA: Diagnosis not present

## 2021-03-11 DIAGNOSIS — R972 Elevated prostate specific antigen [PSA]: Secondary | ICD-10-CM | POA: Diagnosis not present

## 2021-03-11 DIAGNOSIS — E559 Vitamin D deficiency, unspecified: Secondary | ICD-10-CM | POA: Diagnosis not present

## 2021-03-11 DIAGNOSIS — I152 Hypertension secondary to endocrine disorders: Secondary | ICD-10-CM | POA: Diagnosis not present

## 2021-03-11 DIAGNOSIS — E538 Deficiency of other specified B group vitamins: Secondary | ICD-10-CM | POA: Diagnosis not present

## 2021-03-11 DIAGNOSIS — Z1329 Encounter for screening for other suspected endocrine disorder: Secondary | ICD-10-CM | POA: Diagnosis not present

## 2021-03-11 DIAGNOSIS — R5383 Other fatigue: Secondary | ICD-10-CM | POA: Diagnosis not present

## 2021-03-11 DIAGNOSIS — E291 Testicular hypofunction: Secondary | ICD-10-CM

## 2021-03-11 DIAGNOSIS — E785 Hyperlipidemia, unspecified: Secondary | ICD-10-CM | POA: Diagnosis not present

## 2021-03-11 DIAGNOSIS — E611 Iron deficiency: Secondary | ICD-10-CM | POA: Diagnosis not present

## 2021-03-11 LAB — COMPREHENSIVE METABOLIC PANEL
ALT: 31 U/L (ref 0–53)
AST: 16 U/L (ref 0–37)
Albumin: 4.3 g/dL (ref 3.5–5.2)
Alkaline Phosphatase: 46 U/L (ref 39–117)
BUN: 22 mg/dL (ref 6–23)
CO2: 29 mEq/L (ref 19–32)
Calcium: 9.5 mg/dL (ref 8.4–10.5)
Chloride: 100 mEq/L (ref 96–112)
Creatinine, Ser: 1.18 mg/dL (ref 0.40–1.50)
GFR: 63.37 mL/min (ref >60.00)
Glucose, Bld: 155 mg/dL — ABNORMAL HIGH (ref 70–99)
Potassium: 4.2 mEq/L (ref 3.5–5.1)
Sodium: 137 mEq/L (ref 135–145)
Total Bilirubin: 0.4 mg/dL (ref 0.2–1.2)
Total Protein: 6.6 g/dL (ref 6.0–8.3)

## 2021-03-11 LAB — LIPID PANEL
Cholesterol: 215 mg/dL — ABNORMAL HIGH (ref 0–200)
HDL: 37.7 mg/dL — ABNORMAL LOW (ref >39.00)
LDL Cholesterol: 139 mg/dL — ABNORMAL HIGH (ref 0–99)
NonHDL: 177.15
Total CHOL/HDL Ratio: 6
Triglycerides: 191 mg/dL — ABNORMAL HIGH (ref 0.0–149.0)
VLDL: 38.2 mg/dL (ref 0.0–40.0)

## 2021-03-11 LAB — CBC WITH DIFFERENTIAL/PLATELET
Basophils Absolute: 0.1 10*3/uL (ref 0.0–0.1)
Basophils Relative: 0.4 % (ref 0.0–3.0)
Eosinophils Absolute: 0.1 10*3/uL (ref 0.0–0.7)
Eosinophils Relative: 0.8 % (ref 0.0–5.0)
HCT: 44.8 % (ref 39.0–52.0)
Hemoglobin: 15.1 g/dL (ref 13.0–17.0)
Lymphocytes Relative: 20 % (ref 12.0–46.0)
Lymphs Abs: 2.5 10*3/uL (ref 0.7–4.0)
MCHC: 33.7 g/dL (ref 30.0–36.0)
MCV: 88 fl (ref 78.0–100.0)
Monocytes Absolute: 0.7 10*3/uL (ref 0.1–1.0)
Monocytes Relative: 5.4 % (ref 3.0–12.0)
Neutro Abs: 9.1 10*3/uL — ABNORMAL HIGH (ref 1.4–7.7)
Neutrophils Relative %: 73.4 % (ref 43.0–77.0)
Platelets: 246 10*3/uL (ref 150.0–400.0)
RBC: 5.09 Mil/uL (ref 4.22–5.81)
RDW: 14.5 % (ref 11.5–15.5)
WBC: 12.4 10*3/uL — ABNORMAL HIGH (ref 4.0–10.5)

## 2021-03-11 LAB — VITAMIN D 25 HYDROXY (VIT D DEFICIENCY, FRACTURES): VITD: 36.25 ng/mL (ref 30.00–100.00)

## 2021-03-11 LAB — TESTOSTERONE: Testosterone: 267.63 ng/dL — ABNORMAL LOW (ref 300.00–890.00)

## 2021-03-11 LAB — HEMOGLOBIN A1C: Hgb A1c MFr Bld: 6.8 % — ABNORMAL HIGH (ref 4.6–6.5)

## 2021-03-11 LAB — TSH: TSH: 0.56 u[IU]/mL (ref 0.35–4.50)

## 2021-03-11 LAB — VITAMIN B12: Vitamin B-12: 1177 pg/mL — ABNORMAL HIGH (ref 211–911)

## 2021-03-12 LAB — IRON,TIBC AND FERRITIN PANEL
Ferritin: 45 ng/mL (ref 30–400)
Iron Saturation: 14 % — ABNORMAL LOW (ref 15–55)
Iron: 55 ug/dL (ref 38–169)
Total Iron Binding Capacity: 383 ug/dL (ref 250–450)
UIBC: 328 ug/dL (ref 111–343)

## 2021-03-12 LAB — URINALYSIS, ROUTINE W REFLEX MICROSCOPIC
Bilirubin, UA: NEGATIVE
Glucose, UA: NEGATIVE
Ketones, UA: NEGATIVE
Leukocytes,UA: NEGATIVE
Nitrite, UA: NEGATIVE
Protein,UA: NEGATIVE
RBC, UA: NEGATIVE
Specific Gravity, UA: 1.02 (ref 1.005–1.030)
Urobilinogen, Ur: 0.2 mg/dL (ref 0.2–1.0)
pH, UA: 6 (ref 5.0–7.5)

## 2021-03-13 ENCOUNTER — Telehealth: Payer: Self-pay | Admitting: Internal Medicine

## 2021-03-13 DIAGNOSIS — Z79899 Other long term (current) drug therapy: Secondary | ICD-10-CM

## 2021-03-13 DIAGNOSIS — E1142 Type 2 diabetes mellitus with diabetic polyneuropathy: Secondary | ICD-10-CM

## 2021-03-13 NOTE — Telephone Encounter (Signed)
Prior authorization has been submitted for patient's [facility]   Awaiting approval or denial.

## 2021-03-14 ENCOUNTER — Telehealth: Payer: Self-pay | Admitting: Internal Medicine

## 2021-03-14 NOTE — Telephone Encounter (Signed)
PT called back to get their lab results.

## 2021-03-14 NOTE — Telephone Encounter (Signed)
Called and spoke to patient. See result note.

## 2021-03-17 NOTE — Addendum Note (Signed)
Addended by: Orland Mustard on: 03/17/2021 02:49 PM   Modules accepted: Orders

## 2021-03-18 DIAGNOSIS — M5136 Other intervertebral disc degeneration, lumbar region: Secondary | ICD-10-CM | POA: Diagnosis not present

## 2021-03-18 DIAGNOSIS — G8929 Other chronic pain: Secondary | ICD-10-CM | POA: Diagnosis not present

## 2021-03-18 DIAGNOSIS — M545 Low back pain, unspecified: Secondary | ICD-10-CM | POA: Diagnosis not present

## 2021-03-19 NOTE — Addendum Note (Signed)
Addended by: Orland Mustard on: 03/19/2021 02:28 PM   Modules accepted: Orders

## 2021-03-19 NOTE — Telephone Encounter (Signed)
Yes please catie can help try to get approved

## 2021-03-19 NOTE — Telephone Encounter (Signed)
Patient informed and verbalized understanding.  Referral sent in

## 2021-03-19 NOTE — Addendum Note (Signed)
Addended by: Orland Mustard on: 03/19/2021 02:24 PM   Modules accepted: Orders

## 2021-03-19 NOTE — Telephone Encounter (Signed)
Prior authorization has been denied.  Patient not currently seeing Catie. Would you like for me to make another referral for him?   If yes please send back to me and I can place the referral

## 2021-03-20 ENCOUNTER — Ambulatory Visit (INDEPENDENT_AMBULATORY_CARE_PROVIDER_SITE_OTHER): Payer: Medicare Other | Admitting: Dermatology

## 2021-03-20 ENCOUNTER — Telehealth: Payer: Self-pay

## 2021-03-20 ENCOUNTER — Other Ambulatory Visit: Payer: Self-pay

## 2021-03-20 DIAGNOSIS — L820 Inflamed seborrheic keratosis: Secondary | ICD-10-CM

## 2021-03-20 DIAGNOSIS — L570 Actinic keratosis: Secondary | ICD-10-CM

## 2021-03-20 NOTE — Chronic Care Management (AMB) (Signed)
  Chronic Care Management   Note  03/20/2021 Name: Jackson [name] MRN: [id number] DOB: Sep 02, 1951  Jackson [name] is a 70 y.o. year old male who is a primary care patient of [name], [name], MD. SALLY REIMERS is currently enrolled in care management services. An additional referral for Pharm D  was placed.   Follow up plan: Telephone appointment with care management team member scheduled for:03/31/2021  Noreene Larsson, [place], [place],  [id number] Direct Dial: [phone] [email or web address]

## 2021-03-20 NOTE — Progress Notes (Signed)
   Follow-Up Visit   Subjective  Jackson [name] is a 70 y.o. male who presents for the following: Follow-up (Patient here today for concerns with spots on right temple and above  left eyebrow. Spot at temple gets cut with shaving. Patient noticed spots about a few months ago. He states he pulled one off but it came back. He does have history of melanom and bcc skin cancer. Spot above left eyebrow itches, burns and hurts.   The following portions of the chart were reviewed this encounter and updated as appropriate:  Tobacco  Allergies  Meds  Problems  Med Hx  Surg Hx  Fam Hx       Objective  Well appearing patient in no apparent distress; mood and affect are within normal limits.  A focused examination was performed including face. Relevant physical exam findings are noted in the Assessment and Plan.  Objective  Right Temple x 1: Erythematous keratotic or waxy stuck-on papule or plaque.   Objective  Left Forehead x 1: Erythematous thin papules/macules with gritty scale.   Assessment & Plan  Inflamed seborrheic keratosis Right Temple x 1  Prior to procedure, discussed risks of blister formation, small wound, skin dyspigmentation, or rare scar following cryotherapy.    Destruction of lesion - Right Temple x 1  Destruction method: cryotherapy   Informed consent: discussed and consent obtained   Lesion destroyed using liquid nitrogen: Yes   Cryotherapy cycles:  2 Outcome: patient tolerated procedure well with no complications   Post-procedure details: wound care instructions given    Actinic keratosis Left Forehead x 1  Prior to procedure, discussed risks of blister formation, small wound, skin dyspigmentation, or rare scar following cryotherapy.    Hypertrophic ak vs verruca over cancer    Consider biopsy at follow up if not resolved with Ln2 treatment today   Destruction of lesion - Left Forehead x 1  Destruction method: cryotherapy   Informed consent:  discussed and consent obtained   Lesion destroyed using liquid nitrogen: Yes   Cryotherapy cycles:  2 Outcome: patient tolerated procedure well with no complications   Post-procedure details: wound care instructions given     Return for keep follow up as scheduled in 04/30/21.  I, Ruthell Rummage, CMA, am acting as scribe for Forest Gleason, MD.  Documentation: I have reviewed the above documentation for accuracy and completeness, and I agree with the above.  Forest Gleason, MD

## 2021-03-20 NOTE — Patient Instructions (Signed)
Cryotherapy Aftercare  . Wash gently with soap and water everyday.   Marland Kitchen Apply Vaseline and Band-Aid daily until healed.   Actinic keratoses are precancerous spots that appear secondary to cumulative UV radiation exposure/sun exposure over time. They are chronic with expected duration over 1 year. A portion of actinic keratoses will progress to squamous cell carcinoma of the skin. It is not possible to reliably predict which spots will progress to skin cancer and so treatment is recommended to prevent development of skin cancer.  Recommend daily broad spectrum sunscreen SPF 30+ to sun-exposed areas, reapply every 2 hours as needed.  Recommend staying in the shade or wearing long sleeves, sun glasses (UVA+UVB protection) and wide brim hats (4-inch brim around the entire circumference of the hat). Call for new or changing lesions.  Seborrheic Keratosis  What causes seborrheic keratoses? Seborrheic keratoses are harmless, common skin growths that first appear during adult life.  As time goes by, more growths appear.  Some people may develop a large number of them.  Seborrheic keratoses appear on both covered and uncovered body parts.  They are not caused by sunlight.  The tendency to develop seborrheic keratoses can be inherited.  They vary in color from skin-colored to gray, brown, or even black.  They can be either smooth or have a rough, warty surface.   Seborrheic keratoses are superficial and look as if they were stuck on the skin.  Under the microscope this type of keratosis looks like layers upon layers of skin.  That is why at times the top layer may seem to fall off, but the rest of the growth remains and re-grows.    Treatment Seborrheic keratoses do not need to be treated, but can easily be removed in the office.  Seborrheic keratoses often cause symptoms when they rub on clothing or jewelry.  Lesions can be in the way of shaving.  If they become inflamed, they can cause itching, soreness,  or burning.  Removal of a seborrheic keratosis can be accomplished by freezing, burning, or surgery. If any spot bleeds, scabs, or grows rapidly, please return to have it checked, as these can be an indication of a skin cancer.    If you have any questions or concerns for your doctor, please call our main line at [phone] and press option 4 to reach your doctor's medical assistant. If no one answers, please leave a voicemail as directed and we will return your call as soon as possible. Messages left after 4 pm will be answered the following business day.   You may also send [place] a message via [place]. We typically respond to MyChart messages within 1-2 business days.  For prescription refills, please ask your pharmacy to contact our office. Our fax number is [phone].  If you have an urgent issue when the clinic is closed that cannot wait until the next business day, you can page your doctor at the number below.    Please note that while we do our best to be available for urgent issues outside of office hours, we are not available 24/7.   If you have an urgent issue and are unable to reach [place], you may choose to seek medical care at your doctor's office, retail clinic, urgent care center, or emergency room.  If you have a medical emergency, please immediately call 911 or go to the emergency department.  Pager Numbers  - Dr. Nehemiah Massed: [phone]  - Dr. Laurence Ferrari: [phone]  - Dr. Nicole Kindred: [phone]  In the event of inclement weather, please call our main line at [phone] for an update on the status of any delays or closures.  Dermatology Medication Tips: Please keep the boxes that topical medications come in in order to help keep track of the instructions about where and how to use these. Pharmacies typically print the medication instructions only on the boxes and not directly on the medication tubes.   If your medication is too expensive, please contact our office at  [phone] option 4 or send [place] a message through [place].   We are unable to tell what your co-pay for medications will be in advance as this is different depending on your insurance coverage. However, we may be able to find a substitute medication at lower cost or fill out paperwork to get insurance to cover a needed medication.   If a prior authorization is required to get your medication covered by your insurance company, please allow [place] 1-2 business days to complete this process.  Drug prices often vary depending on where the prescription is filled and some pharmacies may offer cheaper prices.  The website www.goodrx.com contains coupons for medications through different pharmacies. The prices here do not account for what the cost may be with help from insurance (it may be cheaper with your insurance), but the website can give you the price if you did not use any insurance.  - You can print the associated coupon and take it with your prescription to the pharmacy.  - You may also stop by our office during regular business hours and pick up a GoodRx coupon card.  - If you need your prescription sent electronically to a different pharmacy, notify our office through [facility] or by phone at [phone] option 4.

## 2021-03-21 DIAGNOSIS — H9202 Otalgia, left ear: Secondary | ICD-10-CM | POA: Diagnosis not present

## 2021-03-21 DIAGNOSIS — J3489 Other specified disorders of nose and nasal sinuses: Secondary | ICD-10-CM | POA: Diagnosis not present

## 2021-03-21 DIAGNOSIS — T162XXA Foreign body in left ear, initial encounter: Secondary | ICD-10-CM | POA: Diagnosis not present

## 2021-03-24 ENCOUNTER — Encounter: Payer: Self-pay | Admitting: Dermatology

## 2021-03-28 ENCOUNTER — Telehealth (INDEPENDENT_AMBULATORY_CARE_PROVIDER_SITE_OTHER): Payer: Medicare Other | Admitting: Internal Medicine

## 2021-03-28 ENCOUNTER — Encounter: Payer: Self-pay | Admitting: Internal Medicine

## 2021-03-28 VITALS — BP 132/118 | HR 62 | Wt 229.0 lb

## 2021-03-28 DIAGNOSIS — E669 Obesity, unspecified: Secondary | ICD-10-CM | POA: Diagnosis not present

## 2021-03-28 DIAGNOSIS — E119 Type 2 diabetes mellitus without complications: Secondary | ICD-10-CM | POA: Diagnosis not present

## 2021-03-28 DIAGNOSIS — E782 Mixed hyperlipidemia: Secondary | ICD-10-CM

## 2021-03-28 DIAGNOSIS — I2510 Atherosclerotic heart disease of native coronary artery without angina pectoris: Secondary | ICD-10-CM | POA: Diagnosis not present

## 2021-03-28 DIAGNOSIS — E785 Hyperlipidemia, unspecified: Secondary | ICD-10-CM | POA: Diagnosis not present

## 2021-03-28 DIAGNOSIS — Z789 Other specified health status: Secondary | ICD-10-CM | POA: Diagnosis not present

## 2021-03-28 NOTE — Progress Notes (Signed)
Virtual Visit via Video Note   This visit type was conducted due to national recommendations for restrictions regarding the COVID-19 Pandemic (e.g. social distancing) in an effort to limit this patient's exposure and mitigate transmission in our community.  Due to his co-morbid illnesses, this patient is at least at moderate risk for complications without adequate follow up.  This format is felt to be most appropriate for this patient at this time.  All issues noted in this document were discussed and addressed.  A limited physical exam was performed with this format.  Please refer to the patient's chart for his consent to telehealth for Jackson [name].      Date:  03/28/2021   ID:  Jackson [name], DOB [birth year], MRN [id number] The patient was identified using 2 identifiers.  Evaluation Performed:  New Patient Evaluation  Patient Location:  [address] [place] [address]  Provider location:   [address], [address] [place], [place] [address]  PCP:  [name], [name], MD  Cardiologist:  Ida Rogue, MD Electrophysiologist:  None   Chief Complaint:  Manage dyslipidemia  History of Present Illness:    Jackson [name] is a 70 y.o. male who presents via audio/video conferencing for a telehealth visit today.  This is a 70 year old male kindly referred by Wynetta Emery, PA-C, and Dr. Esmond Plants for evaluation and management of dyslipidemia and statin intolerance.  Mr. Geiger has a history of statin intolerance having tried 3 different statins before which caused myalgias.  He was at one time offered ezetimibe.  He said he actually had tried it many years ago without significant lipid lowering.  He has a metabolic dyslipidemia on review of his lipids show varying levels of control.  At least a year ago his total cholesterol was under 200 however it slowly crept up.  He has issues with his back and less activity.  Diet is also probably a factor.  Most recently his lipids show  total cholesterol 215, triglycerides 191, HDL 37 and LDL 139.  He has no known coronary disease.  He was noted to have possibly minimal calcification on his coronary valve in 2019 on a chest CT.  Otherwise he also does not have a strong family history of early heart disease.  There is some high cholesterol in family members on his mother side which she said was over 300, suggestive of perhaps a familial hyperlipidemia, however he does not have any evidence of that.  Off treatment cholesterol levels have really not been high enough to suggest this disorder.  He also has had no evidence of any premature onset heart disease.  The patient does not have symptoms concerning for COVID-19 infection (fever, chills, cough, or new SHORTNESS OF BREATH).    Prior CV studies:   The following studies were reviewed today:  Chart reviewed, labwork  PMHx:  Past Medical History:  Diagnosis Date  . Actinic keratosis   . Allergy   . Anemia   . Arthritis   . Basal cell carcinoma   . Cancer (HCC)    BASAL CELL-FACE  . Complication of anesthesia    PT IS A RED HEAD AND STATES IT TAKES ALOT MORE ANESTHESIA TO SEDATE HIM  . Diabetes mellitus without complication ([place])   . Dysrhythmia    SVT  . Fatty liver   . GERD (gastroesophageal reflux disease)   . Hyperlipidemia   . Melanoma ([place]) 02/02/2019   Left forearm. Superficial spreading. Tumor thickness 0.47mm, anatomic level  III. Excised: 02/22/2019, margins free.  . Multiple thyroid nodules   . Rheumatic fever   . Sleep apnea 1997   Uses C-Pap machine  . SVT (supraventricular tachycardia) ([place])   . Thyroid nodule     Past Surgical History:  Procedure Laterality Date  . APPENDECTOMY  1965  . BICEPS TENDON REPAIR Right 05/2015  . COLONOSCOPY  2016  . LUMBAR FUSION  2015   L4/L5  . RHINOPLASTY  1997  . TONSILLECTOMY  1956  . VASECTOMY  1981    FAMHx:  Family History  Problem Relation Age of Onset  . Arthritis Mother   . Lung cancer Mother   .  Hyperlipidemia Mother   . Diabetes Mother   . Arthritis Father   . Diabetes Father   . Lung disease Father   . Hyperlipidemia Maternal Grandmother   . Alcohol abuse Maternal Grandfather   . Hyperlipidemia Maternal Grandfather   . Drug abuse Cousin   . Hyperlipidemia Other        maternal side of family     SOCHx:   reports that he has never smoked. He has quit using smokeless tobacco.  His smokeless tobacco use included snuff. He reports current alcohol use of about 1.0 - 2.0 standard drink of alcohol per week. He reports that he does not use drugs.  ALLERGIES:  Allergies  Allergen Reactions  . Januvia [Sitagliptin]     Kidney pain   . Lipitor [Atorvastatin]     10 mg ?myalgias  . Statins Other (See Comments)    Myalgias; atorvastatin 10 mg, pravastatin 10 mg, rosuvastatin w/ unknown dose.     MEDS:  Current Meds  Medication Sig  . azelastine (ASTELIN) 0.1 % nasal spray USE 1 SPRAY IN EACH NOSTRIL TWICE A DAY AS DIRECTED  . baclofen (LIORESAL) 10 MG tablet Take 5-10 mg by mouth at bedtime.  . cetirizine (ZYRTEC) 10 MG tablet Take 10 mg by mouth at bedtime.   . cholecalciferol (VITAMIN D3) 25 MCG (1000 UT) tablet Take 1,000 Units by mouth daily.  . Coenzyme Q10 (COQ10) 200 MG CAPS Take 200 mg by mouth daily.   . Continuous Blood Gluc Sensor (FREESTYLE LIBRE 2 SENSOR) MISC Use to check glucose Q8H  . ketoconazole (NIZORAL) 2 % shampoo   . metFORMIN (GLUCOPHAGE) 1000 MG tablet TAKE 1 TABLET TWICE A DAY WITH MEALS  . metoprolol succinate (TOPROL-XL) 50 MG 24 hr tablet TAKE 1 TABLET DAILY WITH OR IMMEDIATELY FOLLOWING A MEAL (DOSE CHANGE)  . Multiple Vitamins-Minerals (MULTIVITAMIN ADULT PO) Take 1 tablet by mouth daily.   . Omega-3 Fatty Acids (FISH OIL) 1200 MG CPDR Take 1 tablet by mouth daily.  Marland Kitchen omeprazole (PRILOSEC) 40 MG capsule TAKE 1 CAPSULE DAILY  . OVER THE COUNTER MEDICATION Take 1 tablet by mouth daily. Osteo biflex  . predniSONE (DELTASONE) 10 MG tablet Take by  mouth.  . tamsulosin (FLOMAX) 0.4 MG CAPS capsule Take 1 capsule (0.4 mg total) by mouth daily.  Marland Kitchen triamcinolone ointment (KENALOG) 0.1 % Apply 1 application topically daily. As needed and cover with bandaid. Avoid face, groin, underarms.  . TRULICITY 4.43 XV/4.0GQ SOPN INJECT 0.5 ML (0.75 MG TOTAL) UNDER THE SKIN ONCE A WEEK  . vitamin C (ASCORBIC ACID) 500 MG tablet Take 1,000 mg by mouth daily.   . vitamin E 100 UNIT capsule Take 400 Units by mouth daily.      ROS: Pertinent items noted in HPI and remainder of comprehensive ROS otherwise negative.  Labs/Other Tests and Data Reviewed:    Recent Labs: 03/11/2021: ALT 31; BUN 22; Creatinine, Ser 1.18; Hemoglobin 15.1; Platelets 246.0; Potassium 4.2; Sodium 137; TSH 0.56   Recent Lipid Panel Lab Results  Component Value Date/Time   CHOL 215 (H) 03/11/2021 08:06 AM   TRIG 191.0 (H) 03/11/2021 08:06 AM   HDL 37.70 (L) 03/11/2021 08:06 AM   CHOLHDL 6 03/11/2021 08:06 AM   LDLCALC 139 (H) 03/11/2021 08:06 AM   LDLDIRECT 155.0 07/19/2020 08:03 AM    Wt Readings from Last 3 Encounters:  03/28/21 229 lb (103.9 kg)  03/06/21 230 lb (104.3 kg)  01/31/21 230 lb (104.3 kg)     Exam:    Vital Signs:  BP (!) 132/118   Pulse 62   Wt 229 lb (103.9 kg)   BMI 30.21 kg/m    General appearance: alert and no distress Lungs: no visual respiratory difficulty Abdomen: obese Extremities: extremities normal, atraumatic, no cyanosis or edema Skin: Skin color, texture, turgor normal. No rashes or lesions Neurologic: Grossly normal Psych: Pleasant  ASSESSMENT & PLAN:    1. Metabolic dyslipidemia 2. Obesity 3. Statin intolerance-myalgias  Mr. Vanbenschoten has a metabolic dyslipidemia which is primarily related to diet, obesity and inactivity.  I do not see any evidence of a familial hyperlipidemia.  There is no early onset heart disease in the family despite elevated cholesterol on his mother side.  At times in the past his cholesterol has been  closer to target which leads me to believe that off therapy he can achieve that.  He will not take another statin due to intolerance and says that he was not interested in the ezetimibe.  He is really not a candidate for PCSK9 inhibitors or bempedoic acid.  I would like to start with OTC regimens as well as lifestyle modifications.  We discussed adding red yeast rice, Colest-off, Metamucil and continuing his OTC fish oil.  He will continue to work on lower saturated fat diet, more physical activity and exercise.  Would recommend repeat lipids in 3 to 6 months per his primary cardiologist or primary care provider.  I am happy to see back as needed.  Thanks again for the kind referral.  COVID-19 Education: The signs and symptoms of COVID-19 were discussed with the patient and how to seek care for testing (follow up with PCP or arrange E-visit).  The importance of social distancing was discussed today.  Patient Risk:   After full review of this patients clinical status, I feel that they are at least moderate risk at this time.  Time:   Today, I have spent 25 minutes with the patient with telehealth technology discussing dyslipidemia, statin intolerance, OTC regimens, dietary recommendations to lower cholesterol, exercise regimens.     Medication Adjustments/Labs and Tests Ordered: Current medicines are reviewed at length with the patient today.  Concerns regarding medicines are outlined above.   Tests Ordered: No orders of the defined types were placed in this encounter.   Medication Changes: No orders of the defined types were placed in this encounter.   Disposition:  prn  Pixie Casino, MD, FACC, [place] Director of the [facility] Lipid Disorders &  Cardiovascular Risk Reduction Clinic Diplomate of the American Board of Clinical Lipidology Attending Cardiologist  Direct Dial: [phone]  Fax: [phone]  Website:  [email or web address]  Pixie Casino, MD  03/28/2021 8:33 AM

## 2021-03-28 NOTE — Patient Instructions (Signed)
Medication Instructions:  Dr. Debara Pickett has recommended red yeast rice, cholestoff, over-the-counter fish oil, metamucil along with diet and exercise  *If you need a refill on your cardiac medications before your next appointment, please call your pharmacy*   Follow-Up: At [facility], you and your health needs are our priority.  As part of our continuing mission to provide you with exceptional heart care, we have created designated Provider Care Teams.  These Care Teams include your primary Cardiologist (physician) and Advanced Practice Providers (APPs -  Physician Assistants and Nurse Practitioners) who all work together to provide you with the care you need, when you need it.  We recommend signing up for the patient portal called "MyChart".  Sign up information is provided on this After Visit Summary.  MyChart is used to connect with patients for Virtual Visits (Telemedicine).  Patients are able to view lab/test results, encounter notes, upcoming appointments, etc.  Non-urgent messages can be sent to your provider as well.   To learn more about what you can do with MyChart, go to [email or web address].    Your next appointment:   AS NEEDED with Dr. Debara Pickett for lipid management

## 2021-03-31 ENCOUNTER — Ambulatory Visit (INDEPENDENT_AMBULATORY_CARE_PROVIDER_SITE_OTHER): Payer: Medicare Other | Admitting: Pharmacist

## 2021-03-31 DIAGNOSIS — E1142 Type 2 diabetes mellitus with diabetic polyneuropathy: Secondary | ICD-10-CM | POA: Diagnosis not present

## 2021-03-31 DIAGNOSIS — I152 Hypertension secondary to endocrine disorders: Secondary | ICD-10-CM | POA: Diagnosis not present

## 2021-03-31 DIAGNOSIS — E1159 Type 2 diabetes mellitus with other circulatory complications: Secondary | ICD-10-CM

## 2021-03-31 DIAGNOSIS — E785 Hyperlipidemia, unspecified: Secondary | ICD-10-CM

## 2021-03-31 DIAGNOSIS — M791 Myalgia, unspecified site: Secondary | ICD-10-CM

## 2021-03-31 DIAGNOSIS — T466X5A Adverse effect of antihyperlipidemic and antiarteriosclerotic drugs, initial encounter: Secondary | ICD-10-CM

## 2021-03-31 NOTE — Patient Instructions (Signed)
Visit Information   PATIENT GOALS:  Goals Addressed              This Visit's Progress   .  Medication Management (pt-stated)        Patient Goals/Self-Care Activities . Over the next 90 days, patient will:  - take medications as prescribed check glucose daily, document, and provide at future appointments        Consent to CCM Services: Jackson [name] was given information about Chronic Care Management services today including:  1. CCM service includes personalized support from designated clinical staff supervised by his physician, including individualized plan of care and coordination with other care providers 2. 24/7 contact phone numbers for assistance for urgent and routine care needs. 3. Service will only be billed when office clinical staff spend 20 minutes or more in a month to coordinate care. 4. Only one practitioner may furnish and bill the service in a calendar month. 5. The patient may stop CCM services at any time (effective at the end of the month) by phone call to the office staff. 6. The patient will be responsible for cost sharing (co-pay) of up to 20% of the service fee (after annual deductible is met).  Patient agreed to services and verbal consent obtained.   Patient verbalizes understanding of instructions provided today and agrees to view in [place].   Telephone follow up appointment with care management team member scheduled for: ~4 weeks  Lorel Monaco, PharmD, BCPS PGY2 Ambulatory Care Resident [place]: Patient Care Plan: Medication Management  Completed 10/11/2020  Problem Identified: Diabetes, Hyperlipidemia, Hypertension Resolved 10/11/2020    Long-Range Goal: Disease Management Completed 10/11/2020  This Visit's Progress: On track  Priority: High  Note:   Current Barriers:  . Unable to achieve control of hyperlipidemia  . Unsatisfied with CBG monitoring for diabetes  Pharmacist Clinical Goal(s):  Marland Kitchen Over  the next 90 days, patient will achieve adherence to monitoring guidelines and medication adherence to achieve therapeutic efficacy through collaboration with PharmD and provider.   Interventions: . Inter-disciplinary care team collaboration (see longitudinal plan of care) . Comprehensive medication review performed; medication list updated in electronic medical record  Diabetes: . Controlled; current treatment: metformin 6948 mg BID, Trulicity 5.46 mg weekly . Patient is enjoying using Libre 2 sample. Notes that he is better able to review impact of dietary choices through use of CGM . Reviewed goal A1c, goal range 70-180. Patient would like a script sent to the local pharmacy to pay cash price. This was sent . Sent email to connect to [facility] account.  . Continue current regimen at this tim  Hypertension: . Controlled; current treatment: metoprolol succinate 50 mg daily  . Recommended to continue current treatment.  Hyperlipidemia: . Controlled; current treatment: pravastatin 10 mg daily - notes that he has had bilateral muscle aches/pains since restarting pravastatin . Medications previously tried: atorvastatin (notes that he had been on for years and was fine, but then developed "muscle loss", also states some muscle aches. Reports that he also tried rosuvastatin, but is unsure of dose. Similar problems. Ezetimibe 10 mg daily was not beneficial for LDL per patient report; pravastatin 10 mg daily . Discontinue pravastatin. Cardiology already attempted coverage of bemepdoic acid. Unlikely to qualify for PCSK9i given lack of ASCVD event. Consider referral to [facility] clinic, there may be appropriate clinical trials enrolling that he may qualify for.   GERD: . Controlled per patient report; current  treatment: omeprazole 40 mg daily   Allergies: . Controlled per patient report; current treatment: cetirizine 10 mg daily PRN, azelastine intranasal PRN  Patient Goals/Self-Care  Activities . Over the next 90 days, patient will:  - take medications as prescribed check blood glucose at least TID with CGM, document, and provide at future appointments  Follow Up Plan: Closing CCM case at this time    Patient Care Plan: Medication Management    Problem Identified: T2DM, HTN, HLD, OSA     Long-Range Goal: Disease Progression Prevention   Start Date: 03/31/2021  Priority: High  Note:   Pharmacist Clinical Goal(s):  Marland Kitchen Over the next 90 days, patient will verbalize ability to afford treatment regimen through collaboration with PharmD and provider.   Interventions: . 1:1 collaboration with [name], [name], MD regarding development and update of comprehensive plan of care as evidenced by provider attestation and co-signature . Inter-disciplinary care team collaboration (see longitudinal plan of care) . Comprehensive medication review performed; medication list updated in electronic medical record  Diabetes: . Controlled; current treatment: Trulicity 2.12 mg weekly, metformin 1000 mg BID . Current glucose readings: fasting glucose: 71 this AM . Denies hypoglycemic symptoms, however reports hypoglycemia prior to prednisone taper 3 weeks ago . Pt interested in [place] CGM, however [employer] [facility] insurance denied PA as patient is not on an insulin regimen. Explained Libre CGM is available for ~$75 out of pocket cost. Pt will discuss with wife to determine if cost is affordable. . Recommended to continue current regimen.   If symptomatic hypoglycemia becomes a concern, consider reduction in metformin rather than Trulicity.   Hypertension: . Uncontrolled per last visit; current treatment: metoprolol succinate 50 mg daily;  . Current home readings: will assess at next follow-up visit  . Recommended to continue current regimen  Hyperlipidemia: . Uncontrolled; current treatment: [place]-3 Fatty Acid 1200 mg daily, red yeast rice 600 mg BID;  o Per cardiology visit 6/3: No  heart disease and no strong fhx of heart disease and believes pt not a candidate for PCSK9-inhibitor or Nexletol/Nexlizet. Dr. Debara Pickett recommended adding OTC red yeast rice and continuing OTC fish will in addition to lifestyle modifications.  Medications previously tried: atorvastatin 10 mg, pravastatin 10 mg, and rosuvastatin w/ unknown dose (mylagia); ezetimibe (no significant lipid lowering) . Recommended to continue current regimen in collaboration with cardiology  Benign Prostate Hyperplasia . Controlled; current treatment: tamsulosin 0.4 mg daily . Reports no complaints . Recommended to continue current regimen in collaborating with urology  GERD . Controlled; current treatment: omeprazole 40 mg daily . Recommended to continue current regimen  Allergic Rhinitis . Controlled; current treatment: cetirizine 10 mg daily . Recommended to continue current regimen  Supplementation . Current treatment: Vitamin D, vitamin E, vitamin C, Coenzyme Q10, multivitamin, Osteo Biflex, fish oil, red yeast rice  Patient Goals/Self-Care Activities . Over the next 90 days, patient will:  - take medications as prescribed check glucose daily, document, and provide at future appointments  Follow Up Plan: Telephone follow up appointment with care management team member scheduled for: ~4 weeks

## 2021-03-31 NOTE — Chronic Care Management (AMB) (Addendum)
   03/31/2021  Jackson [name] [birth year] [id number]  Addendum: fill history up to date for chronic medications.   Will discuss shingrix and pneumococcal vaccines at future visits.   I was present for this visit and agree with the documentation by the resident as above.   Catie Darnelle Maffucci, PharmD, [place], [place] Clinical Pharmacist [employer] at [place]

## 2021-03-31 NOTE — Chronic Care Management (AMB) (Signed)
Chronic Care Management Pharmacy Note  03/31/2021 Name:  Jackson [name] MRN:  [id number] DOB:  Jun 16, 1951  Summary:  Recommendations/Changes made from today's visit:  Plan:  Subjective: Jackson [name] is an 70 y.o. year old male who is a primary patient of [name], [name], MD.  The CCM team was consulted for assistance with disease management and care coordination needs.    Engaged with patient by telephone for initial visit in response to provider referral for pharmacy case management and/or care coordination services.   Consent to Services:  The patient was given the following information about Chronic Care Management services today, agreed to services, and gave verbal consent: 1. CCM service includes personalized support from designated clinical staff supervised by the primary care provider, including individualized plan of care and coordination with other care providers 2. 24/7 contact phone numbers for assistance for urgent and routine care needs. 3. Service will only be billed when office clinical staff spend 20 minutes or more in a month to coordinate care. 4. Only one practitioner may furnish and bill the service in a calendar month. 5.The patient may stop CCM services at any time (effective at the end of the month) by phone call to the office staff. 6. The patient will be responsible for cost sharing (co-pay) of up to 20% of the service fee (after annual deductible is met). Patient agreed to services and consent obtained.  Patient Care Team: [name], [name], MD as PCP - General (Internal Medicine) Minna Merritts, MD as PCP - Cardiology (Cardiology) Coral Spikes, DO as Consulting Physician (Family Medicine) Bary Castilla, Forest Gleason, MD (General Surgery) De Hollingshead, RPH-CPP (Pharmacist)  Recent office visits:  Dr. Aundra Dubin (PCP) on 2/23  Follow-up visit regarding compliant of fatigue possibly secondary to iron deficiency, elevated PSA and OSA.   May  consider GI consult for EGD/colonscopy if iron deficiency persists  Recent consult visits:  Cardiology, Dr. Debara Pickett on 6/3  BP 132/118  Reported no heart disease and no strong fhx of heart disease and believes pt not a candidate for PCSK9-inhibitor or Nexletol/Nexlizet. Dr. Debara Pickett recommended adding OTC red yeast rice and continuing OTC fish will in addition to lifestyle modifications.  Urology on 03/06/21  long history of mildly elevated PSA and BPH  Continued Flomax 0.4 mg daily  RTC 6 months with renal ultrasound and PVR  Hospital visits:  Urgent Care on 02/27/21  CC: severe left ear ache - discharged with amoxiillin for 7 days   Objective:  Lab Results  Component Value Date   CREATININE 1.18 03/11/2021   CREATININE 1.30 12/11/2020   CREATININE 1.34 07/19/2020    Lab Results  Component Value Date   HGBA1C 6.8 (H) 03/11/2021   Last diabetic Eye exam:  Lab Results  Component Value Date/Time   HMDIABEYEEXA No Retinopathy 12/26/2020 12:00 AM    Last diabetic Foot exam: No results found for: HMDIABFOOTEX      Component Value Date/Time   CHOL 215 (H) 03/11/2021 0806   TRIG 191.0 (H) 03/11/2021 0806   HDL 37.70 (L) 03/11/2021 0806   CHOLHDL 6 03/11/2021 0806   VLDL 38.2 03/11/2021 0806   LDLCALC 139 (H) 03/11/2021 0806   LDLDIRECT 155.0 07/19/2020 0803    Hepatic Function Latest Ref Rng & Units 03/11/2021 12/11/2020 07/19/2020  Total Protein 6.0 - 8.3 g/dL 6.6 6.6 6.7  Albumin 3.5 - 5.2 g/dL 4.3 4.2 4.3  AST 0 - 37 U/L _0 ALT 0 -  53 U/L 31 34 36  Alk Phosphatase 39 - 117 U/L 46 41 41  Total Bilirubin 0.2 - 1.2 mg/dL 0.4 0.4 0.6    Lab Results  Component Value Date/Time   TSH 0.56 03/11/2021 08:06 AM   TSH 1.66 07/19/2020 08:03 AM   FREET4 0.66 01/20/2018 02:36 PM    CBC Latest Ref Rng & Units 03/11/2021 12/11/2020 07/19/2020  WBC 4.0 - 10.5 K/uL 12.4(H) 9.7 9.6  Hemoglobin 13.0 - 17.0 g/dL 15.1 14.8 16.0  Hematocrit 39.0 - 52.0 % 44.8 43.9 47.4   Platelets 150.0 - 400.0 K/uL 246.0 207.0 214.0    Lab Results  Component Value Date/Time   VD25OH 36.25 03/11/2021 08:06 AM   VD25OH 35.27 06/01/2019 09:10 AM    Clinical ASCVD: No  The 10-year ASCVD risk score Mikey Bussing DC Jr., et al., 2013) is: 39.3%   Values used to calculate the score:     Age: 55 years     Sex: Male     Is Non-Hispanic African American: No     Diabetic: Yes     Tobacco smoker: No     Systolic Blood Pressure: 216 mmHg     Is BP treated: Yes     HDL Cholesterol: 37.7 mg/dL     Total Cholesterol: 215 mg/dL    Other: (CHADS2VASc if Afib, PHQ9 if depression, MMRC or CAT for COPD, ACT, DEXA)  Social History   Tobacco Use  Smoking Status Never Smoker  Smokeless Tobacco Former Systems developer  . Types: Snuff   BP Readings from Last 3 Encounters:  03/28/21 (!) 132/118  03/06/21 139/81  02/27/21 140/83   Pulse Readings from Last 3 Encounters:  03/28/21 62  03/06/21 67  02/27/21 70   Wt Readings from Last 3 Encounters:  03/28/21 229 lb (103.9 kg)  03/06/21 230 lb (104.3 kg)  01/31/21 230 lb (104.3 kg)    Assessment: Review of patient past medical history, allergies, medications, health status, including review of consultants reports, laboratory and other test data, was performed as part of comprehensive evaluation and provision of chronic care management services.   SDOH:  (Social Determinants of Health) assessments and interventions performed:    CCM Care Plan  Allergies  Allergen Reactions  . Januvia [Sitagliptin]     Kidney pain   . Lipitor [Atorvastatin]     10 mg ?myalgias  . Statins Other (See Comments)    Myalgias; atorvastatin 10 mg, pravastatin 10 mg, rosuvastatin w/ unknown dose.     Medications Reviewed Today    Reviewed by Avie Arenas, [place] (Pharmacist) on 03/31/21 at 1033  Med List Status: <None>  Medication Order Taking? Sig Documenting Provider Last Dose Status Informant  azelastine (ASTELIN) 0.1 % nasal spray [id number] Yes USE 1 SPRAY  IN EACH NOSTRIL TWICE A DAY AS DIRECTED [name], [name], MD Taking Active   baclofen (LIORESAL) 10 MG tablet [id number] No Take 5-10 mg by mouth at bedtime.  Patient not taking: Reported on 03/31/2021   [provider] Not Taking Active   cetirizine (ZYRTEC) 10 MG tablet [id number] Yes Take 10 mg by mouth at bedtime.  [provider] Taking Active   cholecalciferol (VITAMIN D3) 25 MCG (1000 UT) tablet [id number] Yes Take 1,000 Units by mouth daily. [provider] Taking Active   Coenzyme Q10 (COQ10) 200 MG CAPS [id number] Yes Take 200 mg by mouth daily.  [provider] Taking Active   Continuous Blood Gluc Sensor (FREESTYLE LIBRE 2 SENSOR) [place] [id number]  Use to check glucose Q8H [name], [name], MD  Active   ketoconazole (NIZORAL) 2 % shampoo [id number] Yes  [provider] Taking Active   metFORMIN (GLUCOPHAGE) 1000 MG tablet [id number] Yes TAKE 1 TABLET TWICE A DAY WITH MEALS [name], [name], MD Taking Active   metoprolol succinate (TOPROL-XL) 50 MG 24 hr tablet [id number] Yes TAKE 1 TABLET DAILY WITH OR IMMEDIATELY FOLLOWING A MEAL (DOSE CHANGE) [name], [name], MD Taking Active   Multiple Vitamins-Minerals (MULTIVITAMIN ADULT PO) [id number] Yes Take 1 tablet by mouth daily.  [provider] Taking Active            Med Note [facility] Graylin Shiver   Tue Jan 02, 2020  3:33 PM)    Omega-3 Fatty Acids (FISH OIL) 1200 MG CPDR [id number] Yes Take 1 tablet by mouth daily. [provider] Taking Active   omeprazole (PRILOSEC) 40 MG capsule [id number] Yes TAKE 1 CAPSULE DAILY [name], [name], MD Taking Active   OVER THE COUNTER MEDICATION [id number] Yes Take 1 tablet by mouth daily. Osteo biflex [provider] Taking Active   predniSONE (DELTASONE) 10 MG tablet [id number] No Take by mouth.  Patient not taking: Reported on 03/31/2021   [provider] Not Taking Active   tamsulosin  (FLOMAX) 0.4 MG CAPS capsule [id number] Yes Take 1 capsule (0.4 mg total) by mouth daily. Billey Co, MD Taking Active   triamcinolone ointment (KENALOG) 0.1 % [id number] No Apply 1 application topically daily. As needed and cover with bandaid. Avoid face, groin, underarms.  Patient not taking: Reported on 03/31/2021   Alfonso Patten, MD Not Taking Active   TRULICITY 6.73 AL/9.3XT SOPN [id number] Yes INJECT 0.5 ML (0.75 MG TOTAL) UNDER THE SKIN ONCE A WEEK [name], [name], MD Taking Active   vitamin C (ASCORBIC ACID) 500 MG tablet [id number] Yes Take 1,000 mg by mouth daily.  [provider] Taking Active   vitamin E 100 UNIT capsule [id number] Yes Take 400 Units by mouth daily.  [provider] Taking Active           Patient Active Problem List   Diagnosis Date Noted  . Abnormal MRI, lumbar spine 02/17/2021  . Iron deficiency 12/30/2020  . Statin intolerance 08/23/2020  . Hypertension associated with diabetes ([place]) 07/24/2020  . Statin myopathy 03/20/2020  . Type 2 diabetes mellitus with hyperglycemia, without long-term current use of insulin ([place]) 12/19/2019  . Elevated PSA 06/13/2019  . Iron deficiency anemia 06/13/2019  . Basal cell carcinoma of skin 03/02/2019  . Melanoma of skin ([place]) 02/21/2019  . AK (actinic keratosis) 11/23/2018  . Vertigo 11/23/2018  . Allergic rhinitis 07/04/2018  . Fatty liver 03/03/2018  . Bradycardia 03/03/2018  . Fatigue 01/23/2018  . Benign prostatic hyperplasia without lower urinary tract symptoms 01/23/2018  . Type 2 diabetes mellitus without complication, without long-term current use of insulin ([place]) 01/23/2018  . Thyroid nodule 01/20/2018  . Hyperlipidemia 11/26/2016  . DM type 2 with diabetic peripheral neuropathy ([place]) 05/27/2016  . Paroxysmal SVT (supraventricular tachycardia) ([place]) 05/27/2016  . GERD (gastroesophageal reflux disease) 05/27/2016  . OSA (obstructive sleep apnea) 05/27/2016    Immunization  History  Administered Date(s) Administered  . Fluad Quad(high Dose 65+) 08/09/2020  . Hep A / Hep B 03/03/2018, 03/31/2018, 09/01/2018  . Influenza, High Dose Seasonal PF 07/04/2018, 07/30/2019  . Influenza,inj,Quad PF,6+ Mos 07/20/2017  . Influenza-Unspecified 07/04/2016, 08/10/2019  . PFIZER(Purple Top)SARS-COV-2 Vaccination 01/04/2020, 02/01/2020, 09/05/2020  . Pneumococcal Conjugate-13 01/20/2018  .  Pneumococcal Polysaccharide-23 12/18/2020  . Tdap 03/21/2018    Conditions to be addressed/monitored: HTN, HLD, DMII and SVT, GERD, allergic rhinitis  Care Plan : Medication Management  Updates made by Joannah Gitlin A, RPH since 03/31/2021 12:00 AM    Problem: T2DM, HTN, HLD, OSA     Long-Range Goal: Disease Progression Prevention   Start Date: 03/31/2021  Priority: High  Note:   Pharmacist Clinical Goal(s):  Marland Kitchen Over the next 90 days, patient will verbalize ability to afford treatment regimen through collaboration with PharmD and provider.  .   Interventions: . 1:1 collaboration with [name], [name], MD regarding development and update of comprehensive plan of care as evidenced by provider attestation and co-signature . Inter-disciplinary care team collaboration (see longitudinal plan of care) . Comprehensive medication review performed; medication list updated in electronic medical record  Diabetes: . Controlled; current treatment: Trulicity 4.27 mg weekly, metformin 1000 mg BID . Current glucose readings: fasting glucose: 71 this AM . Denies hypoglycemic symptoms, however reports hypoglycemia prior to prednisone taper 3 weeks ago . Pt interested in [place] CGM, however [employer] [facility] insurance denied PA as patient is not on an insulin regimen. Explained [place] CGM is available for ~$75 out of pocket cost. Pt will discuss with wife to determine if cost is affordable. . Recommended to continue current regimen.   If symptomatic hypoglycemia becomes a concern, consider reduction in  metformin rather than Trulicity.   Hypertension: . Uncontrolled per last visit; current treatment: metoprolol succinate 50 mg daily;  . Current home readings: will assess at next follow-up visit  . Recommended to continue current regimen  Hyperlipidemia: . Uncontrolled; current treatment: [place]-3 Fatty Acid 1200 mg daily, red yeast rice 600 mg BID;  o Per cardiology visit 6/3: No heart disease and no strong fhx of heart disease and believes pt not a candidate for PCSK9-inhibitor or Nexletol/Nexlizet. Dr. Debara Pickett recommended adding OTC red yeast rice and continuing OTC fish will in addition to lifestyle modifications.  Medications previously tried: atorvastatin 10 mg, pravastatin 10 mg, and rosuvastatin w/ unknown dose (mylagia); ezetimibe (no significant lipid lowering) . Recommended to continue current regimen in collaboration with cardiology  Benign Prostate Hyperplasia . Controlled; current treatment: tamsulosin 0.4 mg daily . Reports no complaints . Recommended to continue current regimen in collaborating with urology  GERD . Controlled; current treatment: omeprazole 40 mg daily . Recommended to continue current regimen  Allergic Rhinitis . Controlled; current treatment: cetirizine 10 mg daily . Recommended to continue current regimen  Supplementation . Current treatment: Vitamin D, vitamin E, vitamin C, Coenzyme Q10, multivitamin, Osteo Biflex, fish oil, red yeast rice  Patient Goals/Self-Care Activities . Over the next 90 days, patient will:  - take medications as prescribed check glucose daily, document, and provide at future appointments  Follow Up Plan: Telephone follow up appointment with care management team member scheduled for: ~4 weeks      Medication Assistance: None required.  Patient affirms current coverage meets needs.  Patient's preferred pharmacy is:  [facility] [place] [address] [place] [address] Phone: [phone] Fax: [phone]  [id number] - [place], [place] - [place] [address] [place] [place] [address] Phone: [phone] Fax: [phone]  Follow Up:  Patient agrees to Care Plan and Follow-up.  Plan: Telephone follow up appointment with care management team member scheduled for:  ~  4 weeks  Lorel Monaco, PharmD, BCPS PGY2 Ambulatory Care Resident [place]

## 2021-04-01 DIAGNOSIS — G8929 Other chronic pain: Secondary | ICD-10-CM | POA: Diagnosis not present

## 2021-04-01 DIAGNOSIS — M545 Low back pain, unspecified: Secondary | ICD-10-CM | POA: Diagnosis not present

## 2021-04-10 ENCOUNTER — Telehealth: Payer: Self-pay | Admitting: Internal Medicine

## 2021-04-10 NOTE — Telephone Encounter (Signed)
Rejection Reason - Other - Malen Gauze, MD to [place], [place]   02/17/21 10:05 PM Thanks for reaching out. I can place a referral to physiatry as we didn't see anything that surgery could be performed for last year. They will likely get new imaging and talk about treatments for pain. Please let me now if OK to place this referral" Peggyann Shoals said on Apr 09, 2021 2:23 PM  Msg from [facility] neurosurgery

## 2021-04-11 NOTE — Telephone Encounter (Signed)
Referral placed Dr. Sharlet Salina for further treatment inform pt per neurosurgery since no surgery indicated at this time

## 2021-04-11 NOTE — Addendum Note (Signed)
Addended by: Orland Mustard on: 04/11/2021 01:07 AM   Modules accepted: Orders

## 2021-04-29 ENCOUNTER — Telehealth: Payer: Self-pay | Admitting: Internal Medicine

## 2021-04-29 ENCOUNTER — Telehealth: Payer: Self-pay

## 2021-04-29 ENCOUNTER — Ambulatory Visit: Payer: Medicare Other | Admitting: Pharmacist

## 2021-04-29 DIAGNOSIS — N400 Enlarged prostate without lower urinary tract symptoms: Secondary | ICD-10-CM

## 2021-04-29 DIAGNOSIS — E785 Hyperlipidemia, unspecified: Secondary | ICD-10-CM

## 2021-04-29 DIAGNOSIS — T466X5A Adverse effect of antihyperlipidemic and antiarteriosclerotic drugs, initial encounter: Secondary | ICD-10-CM

## 2021-04-29 DIAGNOSIS — E119 Type 2 diabetes mellitus without complications: Secondary | ICD-10-CM

## 2021-04-29 NOTE — Telephone Encounter (Signed)
Patient called back after speaking to Access Nurse and called office back requesting a appointment for blood in urine.

## 2021-04-29 NOTE — Telephone Encounter (Signed)
Please advise, no appointments available until Friday

## 2021-04-29 NOTE — Telephone Encounter (Signed)
Incoming call from pt on triage line who states that he has had 1 episode of gross hematuria. He denies fever, states he may have had chills on Saturday. Pt denies pain. Pt scheduled for appt in next available slot 04/30/2021. Pt voiced understanding.

## 2021-04-29 NOTE — Telephone Encounter (Signed)
Transfer to Access Nurse  PT called I stating blood in urine. They stated over the weekend they had stomach pains and may of had heat exhaustion. Transfer PT to be triage with Access Nurse and advise no apts available.

## 2021-04-29 NOTE — Telephone Encounter (Signed)
Pt needs appt for hematuria thanks

## 2021-04-29 NOTE — Progress Notes (Deleted)
04/30/2021 4:38 PM   Jackson [name] 10/12/1951 [id number]  Referring provider: [name], [name], MD [place],  [place] [address]  Urological history: 1. BPH with LU TS -PSA 4.1 11/2020 -I PSS 11/3 -PVR 351 mL -managed with tamsulosin 0.4 mg daily  No chief complaint on file.   HPI: Jackson [name] is a 70 y.o. male who presents today for gross hematuria.      PMH: Past Medical History:  Diagnosis Date   Actinic keratosis    Allergy    Anemia    Arthritis    Basal cell carcinoma    Cancer ([place])    BASAL CELL-FACE   Complication of anesthesia    PT IS A RED HEAD AND STATES IT TAKES ALOT MORE ANESTHESIA TO SEDATE HIM   Diabetes mellitus without complication (HCC)    Dysrhythmia    SVT   Fatty liver    GERD (gastroesophageal reflux disease)    Hyperlipidemia    Melanoma ([place]) 02/02/2019   Left forearm. Superficial spreading. Tumor thickness 0.40mm, anatomic level III. Excised: 02/22/2019, margins free.   Multiple thyroid nodules    Rheumatic fever    Sleep apnea 1997   Uses C-Pap machine   SVT (supraventricular tachycardia) (HCC)    Thyroid nodule     Surgical History: Past Surgical History:  Procedure Laterality Date   APPENDECTOMY  1965   BICEPS TENDON REPAIR Right 05/2015   COLONOSCOPY  2016   LUMBAR FUSION  2015   L4/L5   RHINOPLASTY  1997   TONSILLECTOMY  1956   VASECTOMY  1981    Home Medications:  Allergies as of 04/30/2021       Reactions   Januvia [sitagliptin]    Kidney pain   Lipitor [atorvastatin]    10 mg ?myalgias   Statins Other (See Comments)   Myalgias; atorvastatin 10 mg, pravastatin 10 mg, rosuvastatin w/ unknown dose.         Medication List        Accurate as of April 29, 2021  4:38 PM. If you have any questions, ask your nurse or doctor.          azelastine 0.1 % nasal spray Commonly known as: ASTELIN USE 1 SPRAY IN EACH NOSTRIL TWICE A DAY AS DIRECTED   baclofen 10 MG tablet Commonly  known as: LIORESAL Take 5-10 mg by mouth at bedtime.   cetirizine 10 MG tablet Commonly known as: ZYRTEC Take 10 mg by mouth at bedtime.   cholecalciferol 25 MCG (1000 UNIT) tablet Commonly known as: VITAMIN D3 Take 1,000 Units by mouth daily.   CoQ10 200 MG Caps Take 200 mg by mouth daily.   Fish Oil 1200 MG Cpdr Take 1 tablet by mouth daily.   FreeStyle Libre 2 Sensor Misc Use to check glucose Q8H   ketoconazole 2 % shampoo Commonly known as: NIZORAL   metFORMIN 1000 MG tablet Commonly known as: GLUCOPHAGE TAKE 1 TABLET TWICE A DAY WITH MEALS   metoprolol succinate 50 MG 24 hr tablet Commonly known as: TOPROL-XL TAKE 1 TABLET DAILY WITH OR IMMEDIATELY FOLLOWING A MEAL (DOSE CHANGE)   MULTIVITAMIN ADULT PO Take 1 tablet by mouth daily.   omeprazole 40 MG capsule Commonly known as: PRILOSEC TAKE 1 CAPSULE DAILY   OVER THE COUNTER MEDICATION Take 1 tablet by mouth daily. Osteo biflex   predniSONE 10 MG tablet Commonly known as: DELTASONE Take by mouth.   Red Yeast Rice 600 MG  Tabs Take 600 mg by mouth 2 (two) times daily with a meal.   tamsulosin 0.4 MG Caps capsule Commonly known as: FLOMAX Take 1 capsule (0.4 mg total) by mouth daily.   triamcinolone ointment 0.1 % Commonly known as: KENALOG Apply 1 application topically daily. As needed and cover with bandaid. Avoid face, groin, underarms.   Trulicity 6.38 GY/6.5LD Sopn Generic drug: Dulaglutide INJECT 0.5 ML (0.75 MG TOTAL) UNDER THE SKIN ONCE A WEEK   vitamin C 500 MG tablet Commonly known as: ASCORBIC ACID Take 1,000 mg by mouth daily.   vitamin E 45 MG (100 UNITS) capsule Take 400 Units by mouth daily.        Allergies:  Allergies  Allergen Reactions   Januvia [Sitagliptin]     Kidney pain    Lipitor [Atorvastatin]     10 mg ?myalgias   Statins Other (See Comments)    Myalgias; atorvastatin 10 mg, pravastatin 10 mg, rosuvastatin w/ unknown dose.     Family History: Family  History  Problem Relation Age of Onset   Arthritis Mother    Lung cancer Mother    Hyperlipidemia Mother    Diabetes Mother    Arthritis Father    Diabetes Father    Lung disease Father    Hyperlipidemia Maternal Grandmother    Alcohol abuse Maternal Grandfather    Hyperlipidemia Maternal Grandfather    Drug abuse Cousin    Hyperlipidemia Other        maternal side of family     Social History:  reports that he has never smoked. He has quit using smokeless tobacco.  His smokeless tobacco use included snuff. He reports current alcohol use of about 1.0 - 2.0 standard drink of alcohol per week. He reports that he does not use drugs.  ROS: Pertinent ROS in HPI  Physical Exam: There were no vitals taken for this visit.  Constitutional:  Well nourished. Alert and oriented, No acute distress. HEENT: [place] AT, moist mucus membranes.  Trachea midline, no masses. Cardiovascular: No clubbing, cyanosis, or edema. Respiratory: Normal respiratory effort, no increased work of breathing. GI: Abdomen is soft, non tender, non distended, no abdominal masses. Liver and spleen not palpable.  No hernias appreciated.  Stool sample for occult testing is not indicated.   GU: No CVA tenderness.  No bladder fullness or masses.  Patient with circumcised/uncircumcised phallus. ***Foreskin easily retracted***  Urethral meatus is patent.  No penile discharge. No penile lesions or rashes. Scrotum without lesions, cysts, rashes and/or edema.  Testicles are located scrotally bilaterally. No masses are appreciated in the testicles. Left and right epididymis are normal. Rectal: Patient with  normal sphincter tone. Anus and perineum without scarring or rashes. No rectal masses are appreciated. Prostate is approximately *** grams, *** nodules are appreciated. Seminal vesicles are normal. Skin: No rashes, bruises or suspicious lesions. Lymph: No cervical or inguinal adenopathy. Neurologic: Grossly intact, no focal deficits,  moving all 4 extremities. Psychiatric: Normal mood and affect.  Laboratory Data: Lab Results  Component Value Date   WBC 12.4 (H) 03/11/2021   HGB 15.1 03/11/2021   HCT 44.8 03/11/2021   MCV 88.0 03/11/2021   PLT 246.0 03/11/2021    Lab Results  Component Value Date   CREATININE 1.18 03/11/2021    Lab Results  Component Value Date   PSA 4.93 (H) 06/01/2019   PSA 2.92 06/20/2018   PSA 3.01 05/26/2017    Lab Results  Component Value Date   TESTOSTERONE 267.63 (  L) 03/11/2021    Lab Results  Component Value Date   HGBA1C 6.8 (H) 03/11/2021    Lab Results  Component Value Date   TSH 0.56 03/11/2021       Component Value Date/Time   CHOL 215 (H) 03/11/2021 0806   HDL 37.70 (L) 03/11/2021 0806   CHOLHDL 6 03/11/2021 0806   VLDL 38.2 03/11/2021 0806   LDLCALC 139 (H) 03/11/2021 0806    Lab Results  Component Value Date   AST 16 03/11/2021   Lab Results  Component Value Date   ALT 31 03/11/2021    Urinalysis ***  I have reviewed the labs.   Pertinent Imaging: No recent imaging  Assessment & Plan:  ***  1. Gross hematuria  - I explained to the patient that there are a number of causes that can be associated with blood in the urine, such as stones, *** BPH, UTI's, damage to the urinary tract and/or cancer.  - At this time, I felt that the patient warranted further urologic evaluation with 3 or greater RBC's/hpf on microscopic evaluation of the urine.  The AUA guidelines state that a CT urogram is the preferred imaging study to evaluate hematuria.  - I explained to the patient that a contrast material will be injected into a vein and that in rare instances, an allergic reaction can result and may even life threatening (1:100,000)  The patient denies any allergies to contrast***, iodine and/or seafood*** and is not taking metformin.***  - Her reproductive status is hysterectomy, postmenopausal, tubal ligation are unknown at this time.  We will obtain a  serum pregnancy test today. ***  - On occasion, we may need to resort to a non-contrast study such as a renal ultrasound or a non-contrast CT if the patient has a contrast allergy or renal insufficiency.  In the latter case,  I told him/her *** that an upper tract study without contrast will lack the detail of excluding some urologic tumors.  Because of this, he/she would need to undergo cystoscopy with bilateral retrogrades in the OR to complete the hematuria workup in addition to the imaging studies. ***  - Following the imaging study,  I've recommended a cystoscopy. I described how this is performed, typically in an office setting with a flexible cystoscope. We described the risks, benefits, and possible side effects, the most common of which is a minor amount of blood in the urine and/or burning which usually resolves in 24 to 48 hours.  ***  - The patient had the opportunity to ask questions which were answered. Based upon this discussion, the patient is willing to proceed. Therefore, I've ordered: a CT Urogram and cystoscopy.  - The patient will return following all of the above for discussion of the results.   - UA  - Urine culture  - BUN + creatinine    - if testing returns without finding an etiology for the hematuria, we will need to see the patient back yearly -if they do not have any recurrent gross hematuria or AMH they may be released from our care - if gross hematuria or AMH persist may consider referral to nephrology - if gross hematuria or AMH persists beyond two years - may consider to repeat studies based on patient's risk factors for cancer     No follow-ups on file.  These notes generated with voice recognition software. I apologize for typographical errors.  Zara Council, [place] [address]  [address]  [place], [place] [address] [phone]

## 2021-04-29 NOTE — Telephone Encounter (Signed)
Discussed with PCP. Advised that he should call urologist Dr. Diamantina Providence regarding hematuria. He verbalized understanding.

## 2021-04-29 NOTE — Chronic Care Management (AMB) (Signed)
   04/29/2021  Jackson [name] July 23, 1951 [id number]  While working up patient for visit today, noted Access Nurse calls for hematuria. Discussed with PCP. Advised that patient contact his urologist for evaluation. Per chart review, patient is scheduled for an appointment tomorrow.   Rescheduled our appointment.   Catie Darnelle Maffucci, PharmD, [place], [place] Clinical Pharmacist [employer] [facility] [place]

## 2021-04-30 ENCOUNTER — Encounter
Admission: EM | Disposition: A | Discharge status: Home / Self Care | Payer: Self-pay | Source: Home / Self Care | Attending: Hospitalist

## 2021-04-30 ENCOUNTER — Encounter: Payer: Medicare Other | Admitting: Dermatology

## 2021-04-30 ENCOUNTER — Inpatient Hospital Stay
Admission: EM | Admit: 2021-04-30 | Discharge: 2021-05-02 | DRG: 417 | Disposition: A | Discharge status: Home / Self Care | Payer: Medicare Other | Attending: Hospitalist | Admitting: Hospitalist

## 2021-04-30 ENCOUNTER — Inpatient Hospital Stay: Payer: Medicare Other

## 2021-04-30 ENCOUNTER — Inpatient Hospital Stay: Payer: Medicare Other | Admitting: Anesthesiology

## 2021-04-30 ENCOUNTER — Ambulatory Visit: Payer: Medicare Other | Admitting: Urology

## 2021-04-30 ENCOUNTER — Other Ambulatory Visit: Payer: Self-pay

## 2021-04-30 ENCOUNTER — Encounter: Payer: Self-pay | Admitting: *Deleted

## 2021-04-30 ENCOUNTER — Emergency Department: Payer: Medicare Other

## 2021-04-30 DIAGNOSIS — K439 Ventral hernia without obstruction or gangrene: Secondary | ICD-10-CM | POA: Diagnosis present

## 2021-04-30 DIAGNOSIS — K219 Gastro-esophageal reflux disease without esophagitis: Secondary | ICD-10-CM | POA: Diagnosis present

## 2021-04-30 DIAGNOSIS — Z981 Arthrodesis status: Secondary | ICD-10-CM

## 2021-04-30 DIAGNOSIS — K862 Cyst of pancreas: Secondary | ICD-10-CM

## 2021-04-30 DIAGNOSIS — I152 Hypertension secondary to endocrine disorders: Secondary | ICD-10-CM | POA: Diagnosis present

## 2021-04-30 DIAGNOSIS — R7401 Elevation of levels of liver transaminase levels: Secondary | ICD-10-CM

## 2021-04-30 DIAGNOSIS — K851 Biliary acute pancreatitis without necrosis or infection: Secondary | ICD-10-CM | POA: Diagnosis present

## 2021-04-30 DIAGNOSIS — E1165 Type 2 diabetes mellitus with hyperglycemia: Secondary | ICD-10-CM

## 2021-04-30 DIAGNOSIS — K429 Umbilical hernia without obstruction or gangrene: Secondary | ICD-10-CM | POA: Diagnosis present

## 2021-04-30 DIAGNOSIS — Z8261 Family history of arthritis: Secondary | ICD-10-CM | POA: Diagnosis not present

## 2021-04-30 DIAGNOSIS — Z833 Family history of diabetes mellitus: Secondary | ICD-10-CM

## 2021-04-30 DIAGNOSIS — Z85828 Personal history of other malignant neoplasm of skin: Secondary | ICD-10-CM | POA: Diagnosis not present

## 2021-04-30 DIAGNOSIS — E785 Hyperlipidemia, unspecified: Secondary | ICD-10-CM | POA: Diagnosis not present

## 2021-04-30 DIAGNOSIS — K8000 Calculus of gallbladder with acute cholecystitis without obstruction: Secondary | ICD-10-CM | POA: Diagnosis present

## 2021-04-30 DIAGNOSIS — Z87891 Personal history of nicotine dependence: Secondary | ICD-10-CM

## 2021-04-30 DIAGNOSIS — R1011 Right upper quadrant pain: Secondary | ICD-10-CM | POA: Diagnosis not present

## 2021-04-30 DIAGNOSIS — J309 Allergic rhinitis, unspecified: Secondary | ICD-10-CM

## 2021-04-30 DIAGNOSIS — Z20822 Contact with and (suspected) exposure to covid-19: Secondary | ICD-10-CM | POA: Diagnosis present

## 2021-04-30 DIAGNOSIS — K819 Cholecystitis, unspecified: Secondary | ICD-10-CM

## 2021-04-30 DIAGNOSIS — Z888 Allergy status to other drugs, medicaments and biological substances status: Secondary | ICD-10-CM

## 2021-04-30 DIAGNOSIS — E1169 Type 2 diabetes mellitus with other specified complication: Secondary | ICD-10-CM | POA: Diagnosis present

## 2021-04-30 DIAGNOSIS — Z7984 Long term (current) use of oral hypoglycemic drugs: Secondary | ICD-10-CM

## 2021-04-30 DIAGNOSIS — Z79899 Other long term (current) drug therapy: Secondary | ICD-10-CM

## 2021-04-30 DIAGNOSIS — Z9852 Vasectomy status: Secondary | ICD-10-CM | POA: Diagnosis not present

## 2021-04-30 DIAGNOSIS — G4733 Obstructive sleep apnea (adult) (pediatric): Secondary | ICD-10-CM | POA: Diagnosis present

## 2021-04-30 DIAGNOSIS — N281 Cyst of kidney, acquired: Secondary | ICD-10-CM | POA: Diagnosis not present

## 2021-04-30 DIAGNOSIS — I10 Essential (primary) hypertension: Secondary | ICD-10-CM | POA: Diagnosis present

## 2021-04-30 DIAGNOSIS — R001 Bradycardia, unspecified: Secondary | ICD-10-CM | POA: Diagnosis not present

## 2021-04-30 DIAGNOSIS — R17 Unspecified jaundice: Secondary | ICD-10-CM | POA: Diagnosis not present

## 2021-04-30 DIAGNOSIS — K760 Fatty (change of) liver, not elsewhere classified: Secondary | ICD-10-CM | POA: Diagnosis present

## 2021-04-30 DIAGNOSIS — M1990 Unspecified osteoarthritis, unspecified site: Secondary | ICD-10-CM | POA: Diagnosis present

## 2021-04-30 DIAGNOSIS — N400 Enlarged prostate without lower urinary tract symptoms: Secondary | ICD-10-CM | POA: Diagnosis present

## 2021-04-30 DIAGNOSIS — R748 Abnormal levels of other serum enzymes: Secondary | ICD-10-CM

## 2021-04-30 DIAGNOSIS — E871 Hypo-osmolality and hyponatremia: Secondary | ICD-10-CM | POA: Diagnosis not present

## 2021-04-30 DIAGNOSIS — Z83438 Family history of other disorder of lipoprotein metabolism and other lipidemia: Secondary | ICD-10-CM

## 2021-04-30 DIAGNOSIS — K8020 Calculus of gallbladder without cholecystitis without obstruction: Secondary | ICD-10-CM | POA: Diagnosis not present

## 2021-04-30 DIAGNOSIS — K810 Acute cholecystitis: Secondary | ICD-10-CM | POA: Diagnosis not present

## 2021-04-30 LAB — HEPATIC FUNCTION PANEL
ALT: 136 U/L — ABNORMAL HIGH (ref 0–44)
AST: 77 U/L — ABNORMAL HIGH (ref 15–41)
Albumin: 3.4 g/dL — ABNORMAL LOW (ref 3.5–5.0)
Alkaline Phosphatase: 121 U/L (ref 38–126)
Bilirubin, Direct: 0.7 mg/dL — ABNORMAL HIGH (ref 0.0–0.2)
Indirect Bilirubin: 1 mg/dL — ABNORMAL HIGH (ref 0.3–0.9)
Total Bilirubin: 1.7 mg/dL — ABNORMAL HIGH (ref 0.3–1.2)
Total Protein: 6.6 g/dL (ref 6.5–8.1)

## 2021-04-30 LAB — CBC
HCT: 40.9 % (ref 39.0–52.0)
Hemoglobin: 14.1 g/dL (ref 13.0–17.0)
MCH: 30.6 pg (ref 26.0–34.0)
MCHC: 34.5 g/dL (ref 30.0–36.0)
MCV: 88.7 fL (ref 80.0–100.0)
Platelets: 218 10*3/uL (ref 150–400)
RBC: 4.61 MIL/uL (ref 4.22–5.81)
RDW: 13.9 % (ref 11.5–15.5)
WBC: 10.8 10*3/uL — ABNORMAL HIGH (ref 4.0–10.5)
nRBC: 0 % (ref 0.0–0.2)

## 2021-04-30 LAB — URINALYSIS, COMPLETE (UACMP) WITH MICROSCOPIC
Bacteria, UA: NONE SEEN
Bilirubin Urine: NEGATIVE
Glucose, UA: NEGATIVE mg/dL
Hgb urine dipstick: NEGATIVE
Ketones, ur: NEGATIVE mg/dL
Leukocytes,Ua: NEGATIVE
Nitrite: NEGATIVE
Protein, ur: NEGATIVE mg/dL
Specific Gravity, Urine: 1.009 (ref 1.005–1.030)
pH: 6 (ref 5.0–8.0)

## 2021-04-30 LAB — COMPREHENSIVE METABOLIC PANEL
ALT: 147 U/L — ABNORMAL HIGH (ref 0–44)
AST: 111 U/L — ABNORMAL HIGH (ref 15–41)
Albumin: 3.6 g/dL (ref 3.5–5.0)
Alkaline Phosphatase: 121 U/L (ref 38–126)
Anion gap: 11 (ref 5–15)
BUN: 18 mg/dL (ref 8–23)
CO2: 20 mmol/L — ABNORMAL LOW (ref 22–32)
Calcium: 8.3 mg/dL — ABNORMAL LOW (ref 8.9–10.3)
Chloride: 102 mmol/L (ref 98–111)
Creatinine, Ser: 1.03 mg/dL (ref 0.61–1.24)
GFR, Estimated: >60 mL/min (ref >60)
Glucose, Bld: 163 mg/dL — ABNORMAL HIGH (ref 70–99)
Potassium: 4.7 mmol/L (ref 3.5–5.1)
Sodium: 133 mmol/L — ABNORMAL LOW (ref 135–145)
Total Bilirubin: 3.9 mg/dL — ABNORMAL HIGH (ref 0.3–1.2)
Total Protein: 6.8 g/dL (ref 6.5–8.1)

## 2021-04-30 LAB — TYPE AND SCREEN
ABO/RH(D): A NEG
Antibody Screen: NEGATIVE

## 2021-04-30 LAB — CBG MONITORING, ED
Glucose-Capillary: 121 mg/dL — ABNORMAL HIGH (ref 70–99)
Glucose-Capillary: 108 mg/dL — ABNORMAL HIGH (ref 70–99)

## 2021-04-30 LAB — RESP PANEL BY RT-PCR (FLU A&B, COVID) ARPGX2
Influenza A by PCR: NEGATIVE
Influenza B by PCR: NEGATIVE
SARS Coronavirus 2 by RT PCR: NEGATIVE

## 2021-04-30 LAB — PROTIME-INR
INR: 1.1 (ref 0.8–1.2)
Prothrombin Time: 13.9 seconds (ref 11.4–15.2)

## 2021-04-30 LAB — SURGICAL PCR SCREEN
MRSA, PCR: NEGATIVE
Staphylococcus aureus: POSITIVE — AB

## 2021-04-30 LAB — HIV ANTIBODY (ROUTINE TESTING W REFLEX): HIV Screen 4th Generation wRfx: NONREACTIVE

## 2021-04-30 LAB — LIPASE, BLOOD
Lipase: 257 U/L — ABNORMAL HIGH (ref 11–51)
Lipase: 199 U/L — ABNORMAL HIGH (ref 11–51)

## 2021-04-30 LAB — GLUCOSE, CAPILLARY: Glucose-Capillary: 195 mg/dL — ABNORMAL HIGH (ref 70–99)

## 2021-04-30 IMAGING — MR MR MRCP
11 of 13 series · 41 of 48 positions shown · non-contrast
Comparison: No prior abdominal MRI. Abdominal ultrasound
[DATE].

CLINICAL DATA: 69-year-old male with history of right upper
quadrant abdominal pain. Ultrasound was indicative of acute
cholecystitis.

EXAM:
MRI ABDOMEN WITHOUT CONTRAST  (INCLUDING MRCP)
TECHNIQUE: Multiplanar multisequence MR imaging of the abdomen was performed.
Heavily T2-weighted images of the biliary and pancreatic ducts were
obtained, and three-dimensional MRCP images were rendered by post
processing.

[Series 3: T2 · axial · 6.0mm · 1.25mm/px · z∈[-159,+122]mm · 2 of 40 slices shown (1 of 2)]
[im 1/40]
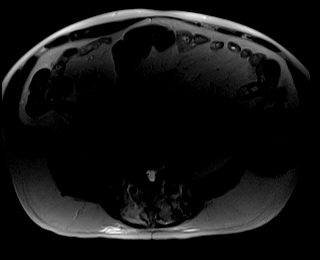
[im 40/40]
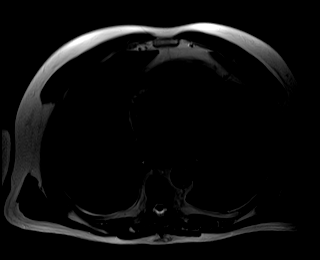

[Series 4: T2 · coronal · 6.0mm · 1.25mm/px · 2 of 39 slices shown (2 of 2)]
[im 1/39]
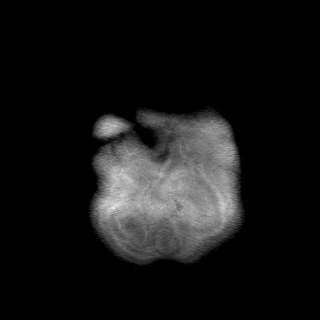
[im 39/39]
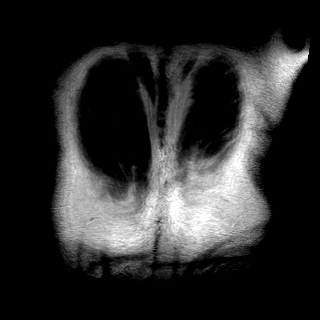

[Series 7: T2 fat-sat · axial · 6.0mm · 1.25mm/px · z∈[-149,+124]mm · 3 of 39 slices shown]
[im 1/39]
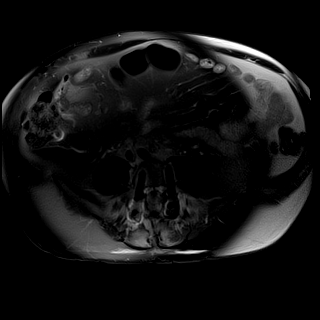
[im 20/39]
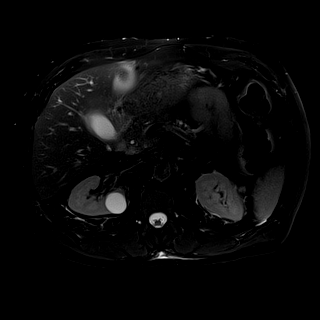
[im 39/39]
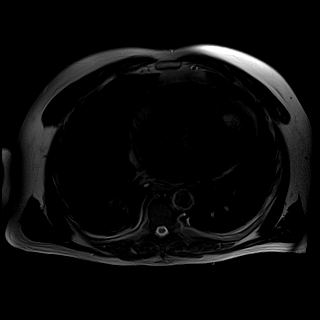

[Series 8: in & out · axial · 3.0mm · 1.25mm/px · z∈[-143,+142]mm · 6 of 96 slices shown (1 of 2)]
[im 1/96]
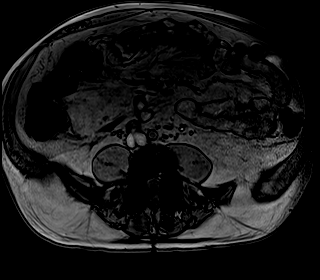
[im 20/96]
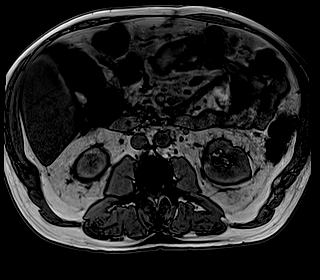
[im 39/96]
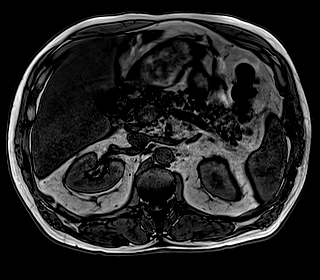
[im 58/96]
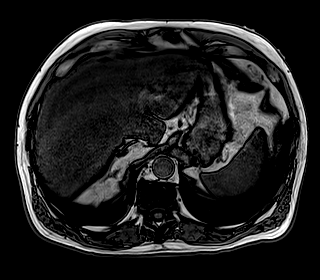
[im 77/96]
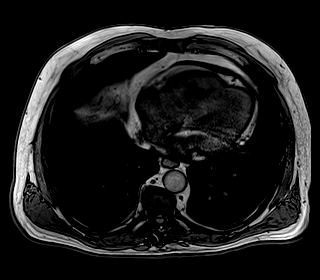
[im 96/96]
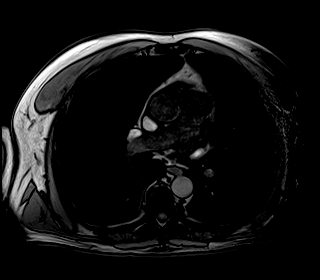

[Series 9: in & out · axial · 3.0mm · 1.25mm/px · z∈[-143,+142]mm · 6 of 96 slices shown (2 of 2)]
[im 1/96]
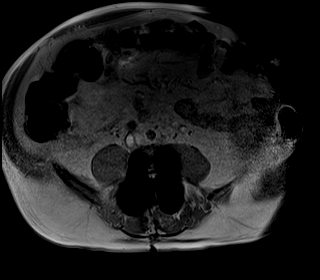
[im 20/96]
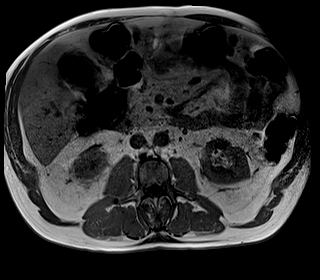
[im 39/96]
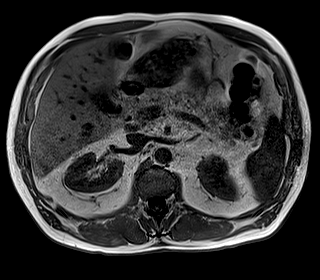
[im 58/96]
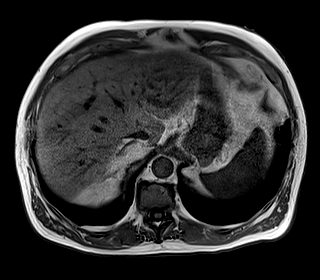
[im 77/96]
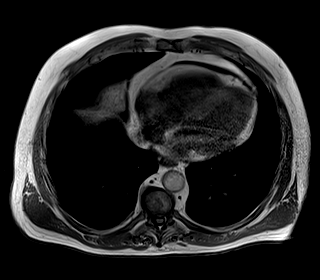
[im 96/96]
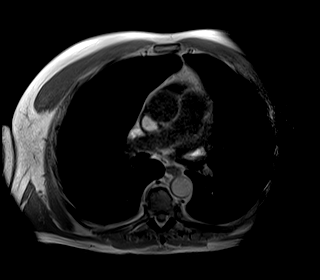

[Series 10: ax dwi_tracew · axial · 6.0mm · 1.49mm/px · z∈[-153,+128]mm · 8 of 120 slices shown]
[im 1/120]
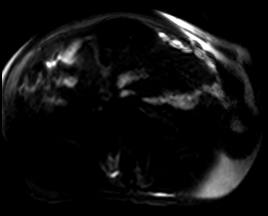
[im 18/120]
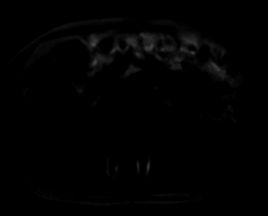
[im 35/120]
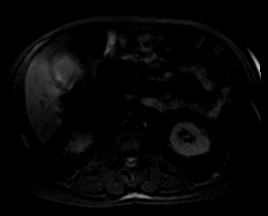
[im 52/120]
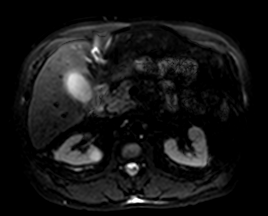
[im 69/120]
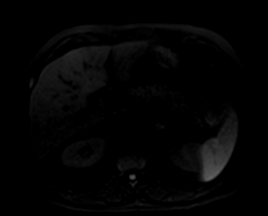
[im 86/120]
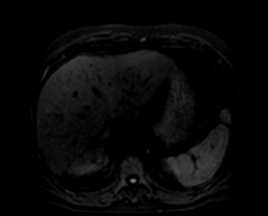
[im 103/120]
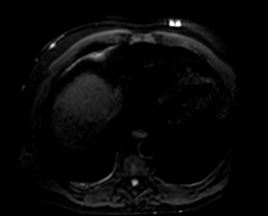
[im 120/120]
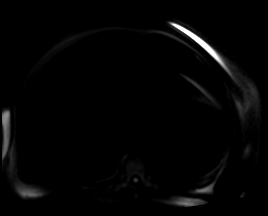

[Series 11: ax dwi_adc · axial · 6.0mm · 1.49mm/px · z∈[-153,+128]mm · 3 of 40 slices shown]
[im 1/40]
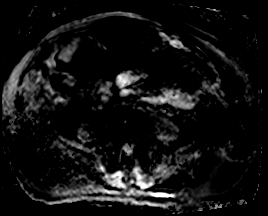
[im 20/40]
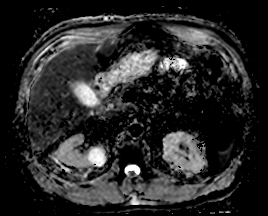
[im 40/40]
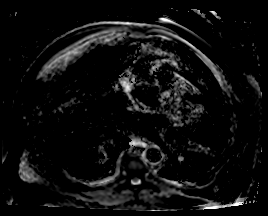

[Series 12: bSSFP · axial · 6.0mm · 0.78mm/px · z∈[-145,+136]mm · 3 of 40 slices shown]
[im 1/40]
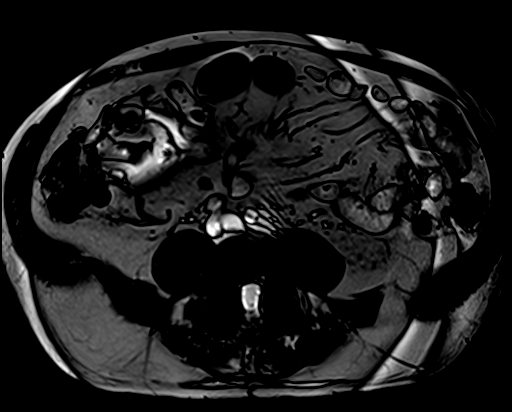
[im 20/40]
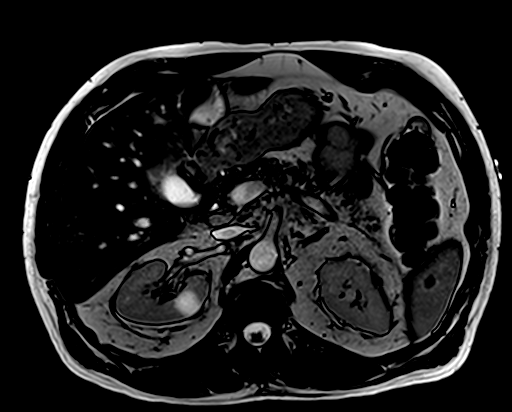
[im 40/40]
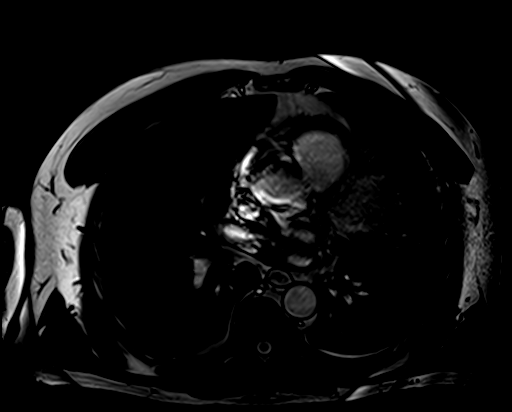

[Series 17: MRCP · coronal · 3.0mm · 1.25mm/px · 1 of 19 slices shown]
[im 1/19]
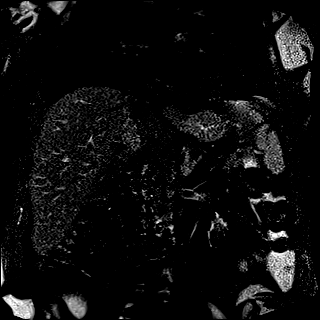

[Series 18: radials · coronal · 50.0mm · 0.78mm/px · 1 of 5 slices shown]
[im 1/5]
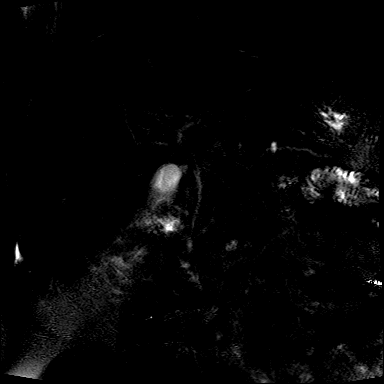

[Series 19: T1 dynamic fat-sat · axial · non-contrast · 3.0mm · 1.25mm/px · z∈[-132,+129]mm · 6 of 88 slices shown]
[im 1/88]
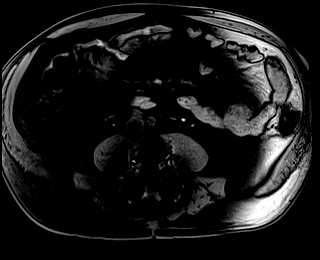
[im 18/88]
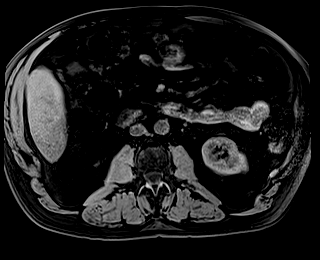
[im 35/88]
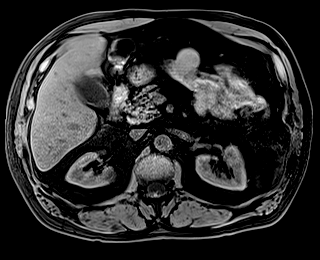
[im 53/88]
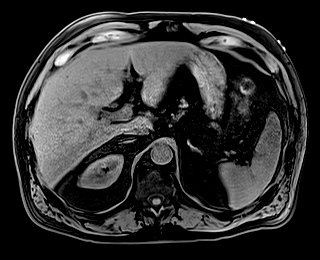
[im 70/88]
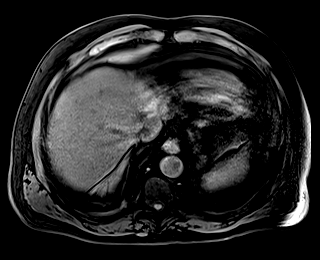
[im 88/88]
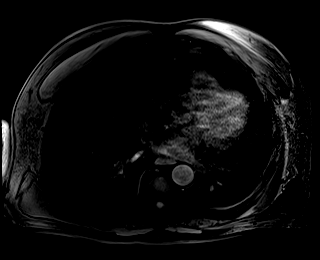

[41 of 48 positions shown; findings below may reference images not displayed]

FINDINGS: Comment: Today's study is limited for detection and characterization
of visceral and/or vascular lesions by lack of IV gadolinium.

Lower chest: Unremarkable.

Hepatobiliary: Diffuse loss of signal intensity throughout the
hepatic parenchyma on out of phase dual echo images, indicative of a
background of hepatic steatosis. No suspicious cystic or solid
hepatic lesions are confidently identified on today's noncontrast
examination. Tiny filling defects are present within the lumen of
the gallbladder, compatible with small gallstones. Gallbladder is
moderately distended. Gallbladder wall appears thickened and
edematous measuring up to 11 mm in thickness with trace volume of
pericholecystic fluid. There are no filling defects within the
common bile duct to suggest choledocholithiasis. Common bile duct
measures 3 mm in the porta hepatis. No intrahepatic biliary ductal
dilatation noted on MRCP images.

Pancreas: In the inferior aspect of the pancreatic head (axial image
27 of series 7) there is a 9 mm T2 hyperintense lesion. In the
pancreatic body (axial image 16 of series 7) there is an additional
6 mm T2 hyperintense lesion. No definite solid appearing pancreatic
mass noted on today's noncontrast examination. No pancreatic ductal
dilatation noted on MRCP images. No peripancreatic fluid collections
or inflammatory changes.

Spleen:  Unremarkable.

Adrenals/Urinary Tract: There are several T1 hypointense and T2
hyperintense lesions in the kidneys, measuring up to 2.9 cm in
diameter in the medial aspect of the upper pole of the right kidney.
In addition, in the upper pole of the right kidney laterally there
is a 1.3 cm lesion (axial image 32 of series 19) which is mildly T1
hyperintense and T2 hyperintense. No hydroureteronephrosis in the
visualized portions of the abdomen. Bilateral adrenal glands are
normal in appearance.

Stomach/Bowel: Visualized portions are unremarkable.

Vascular/Lymphatic: No aneurysm identified in the visualized
abdominal vasculature. No lymphadenopathy noted in the abdomen.

Other: No significant volume of ascites noted in the visualized
portions of the peritoneal cavity.

Musculoskeletal: No aggressive appearing osseous lesions are noted
in the visualize portions of the skeleton. Signal void noted in the
lumbar spine related to indwelling spinal fixation hardware.
IMPRESSION: 1. Study is positive for cholelithiasis with imaging findings
suggestive of acute cholecystitis. No evidence of
choledocholithiasis. No findings of biliary tract obstruction.
2. Subcentimeter cystic lesions in the pancreatic head and body.
These are statistically likely to represent focal areas of side
branch ectasia or small pancreatic pseudocysts, however, the
possibility of side branch IPMN (intraductal papillary mucinous
neoplasm is not excluded). Recommend follow up pre and post contrast
MRI/MRCP or pancreatic protocol CT in 2 years. This recommendation
follows ACR consensus guidelines: Management of Incidental
Pancreatic Cysts: A White Paper of the ACR Incidental Findings
Committee. [HOSPITAL] [MG];[DATE].
3. Multiple renal lesions bilaterally, as above, incompletely
characterized on today's noncontrast examination but likely to
represent Bosniak class 1 and Bosniak class 2 cysts. Attention at
time of repeat abdominal MRI is recommended to ensure stability.
4. Hepatic steatosis.

## 2021-04-30 IMAGING — US US ABDOMEN LIMITED
1 series · 14 of 25 positions shown · non-contrast
Comparison: [DATE]

CLINICAL DATA: Elevated liver function studies. Elevated bilirubin.
Right upper quadrant pain for 4 days.

EXAM:
ULTRASOUND ABDOMEN LIMITED RIGHT UPPER QUADRANT

[Series 1: us abdomen limited ruq (liver/gb) · 14 of 38 slices shown]
[im 1/38]
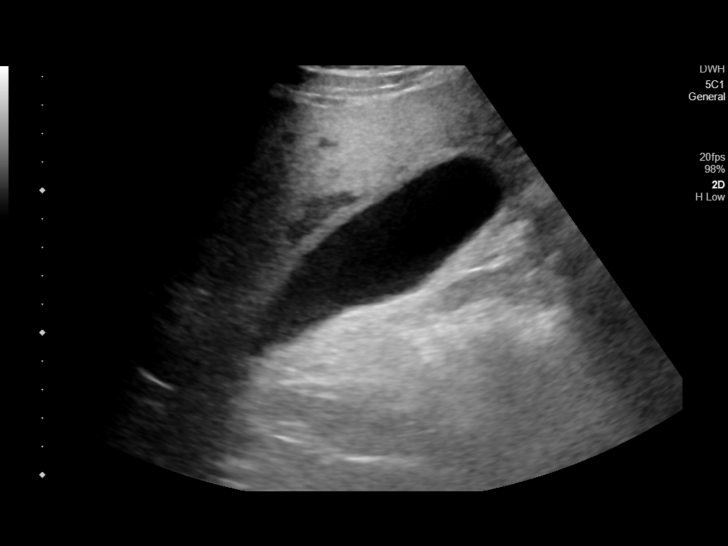
[im 4/38]
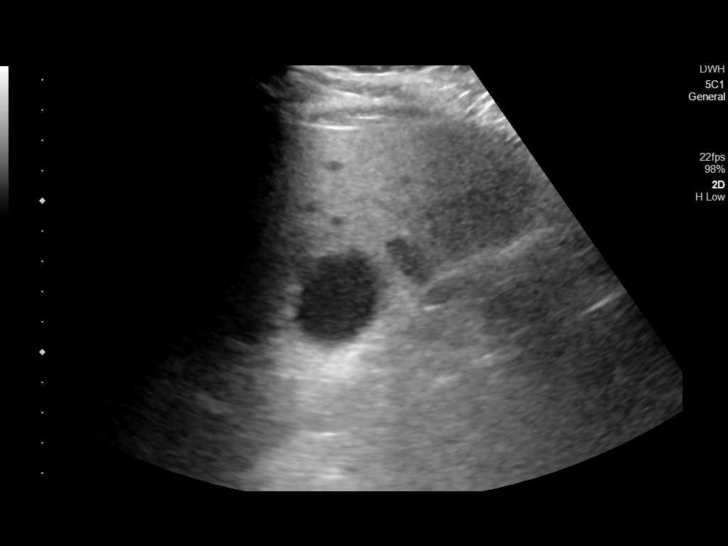
[im 7/38]
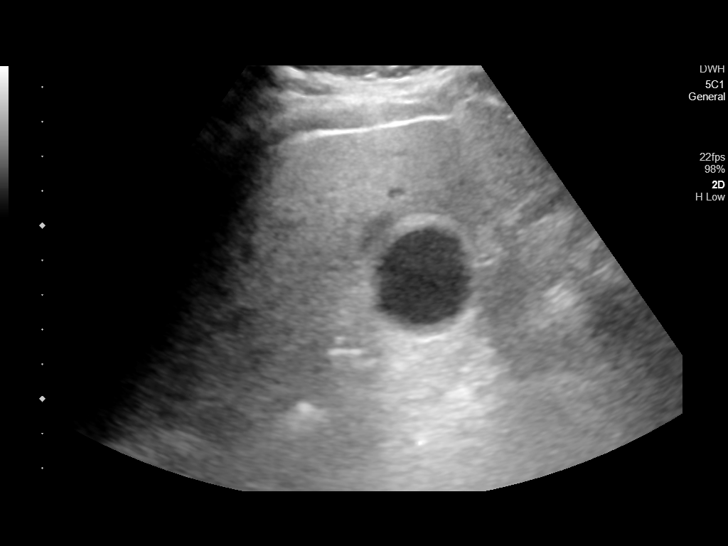
[im 10/38]
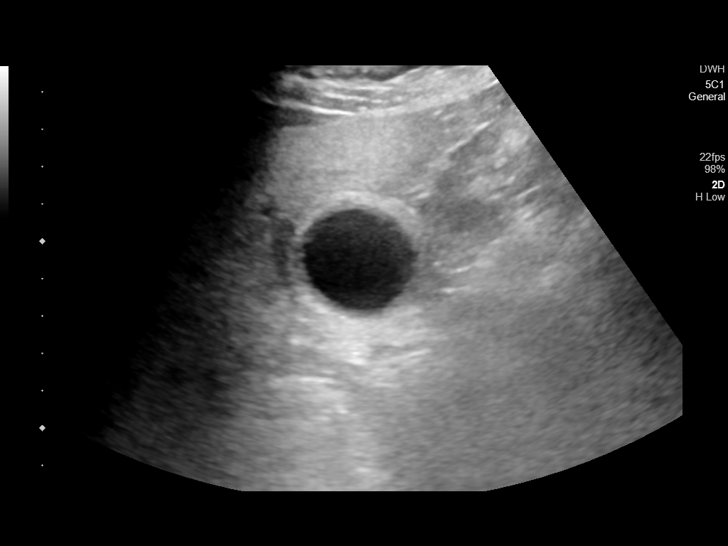
[im 13/38]
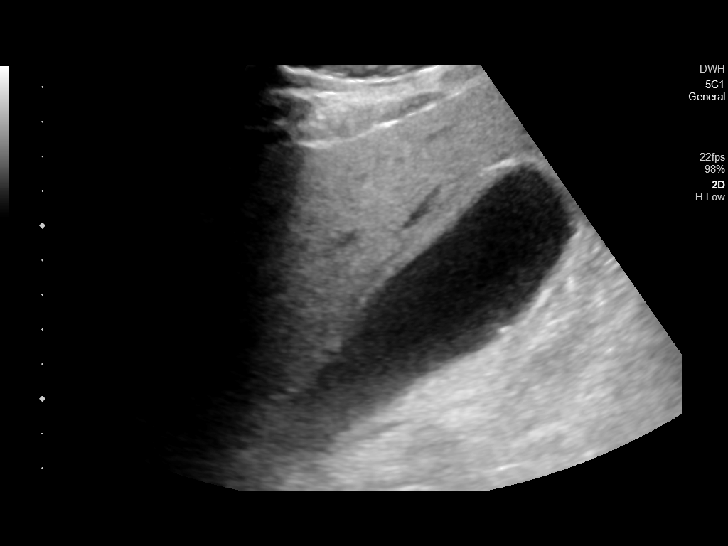
[im 14/38]
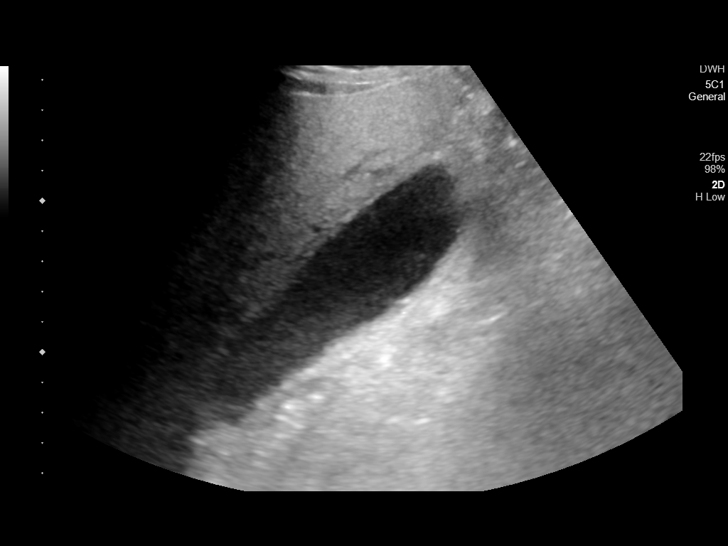
[im 17/38]
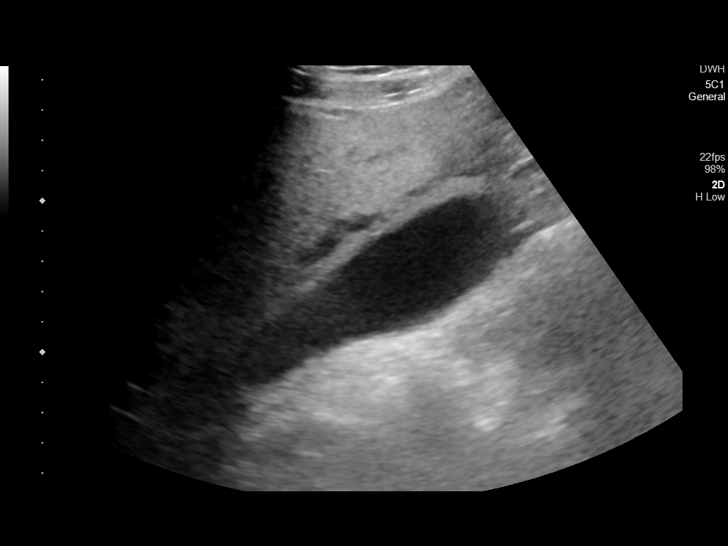
[im 21/38]
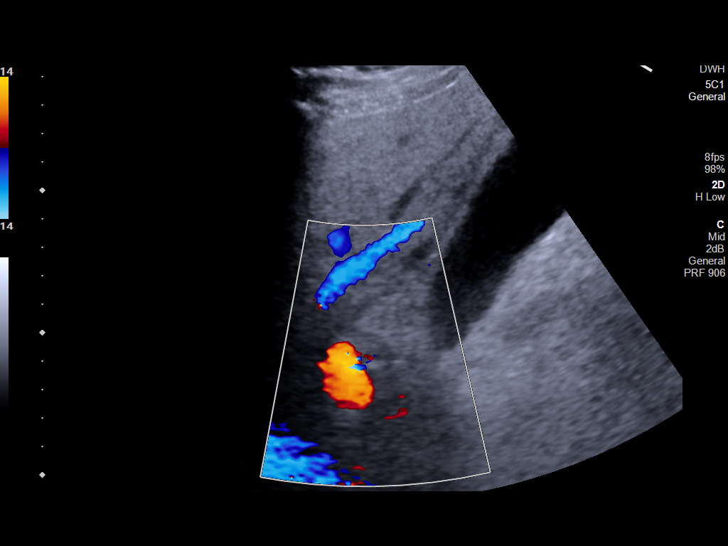
[im 24/38]
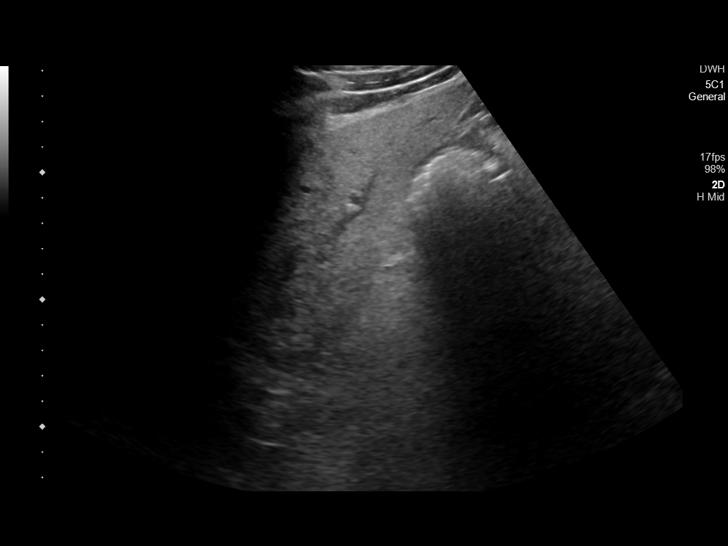
[im 25/38]
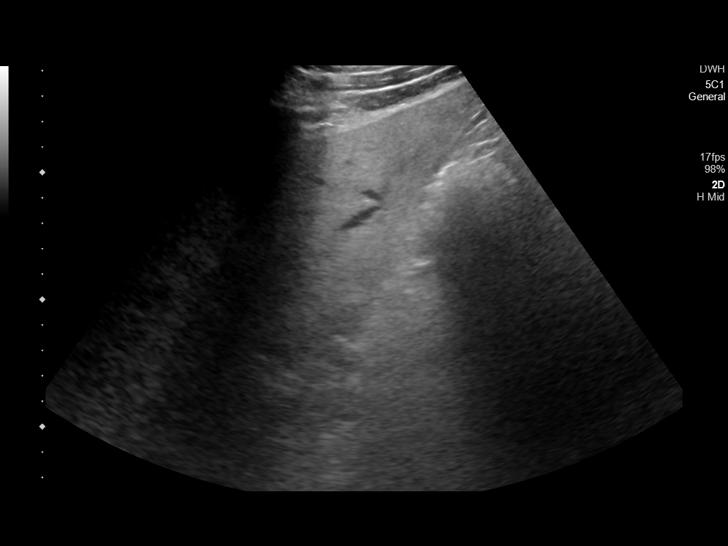
[im 28/38]
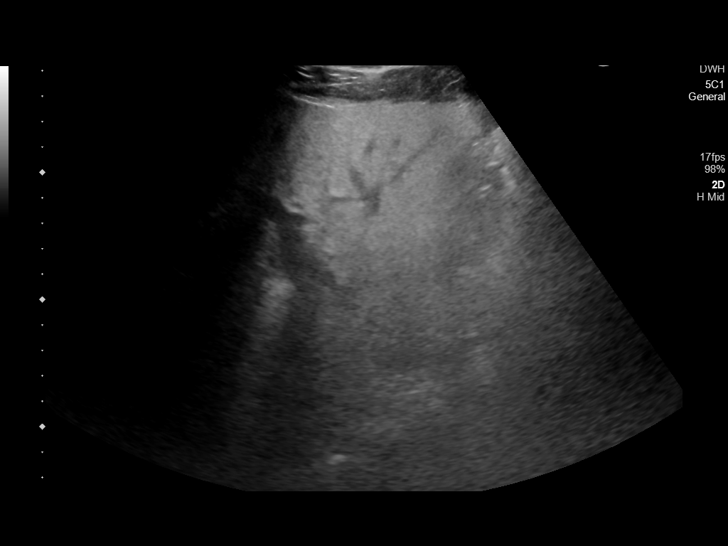
[im 31/38]
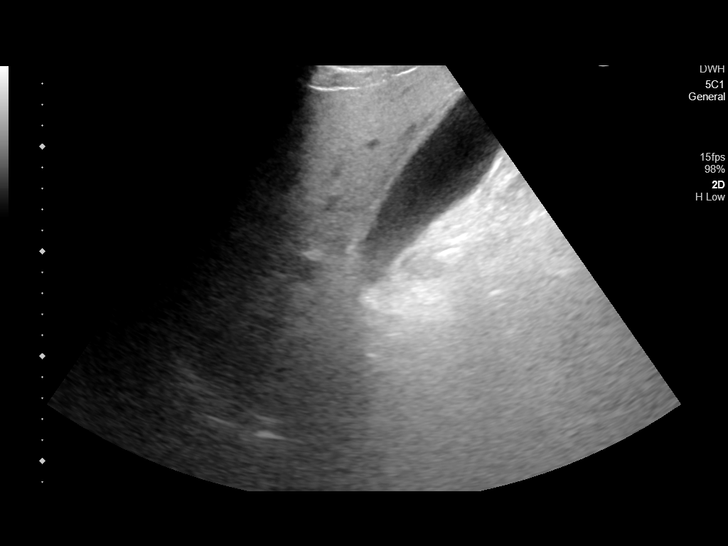
[im 34/38]
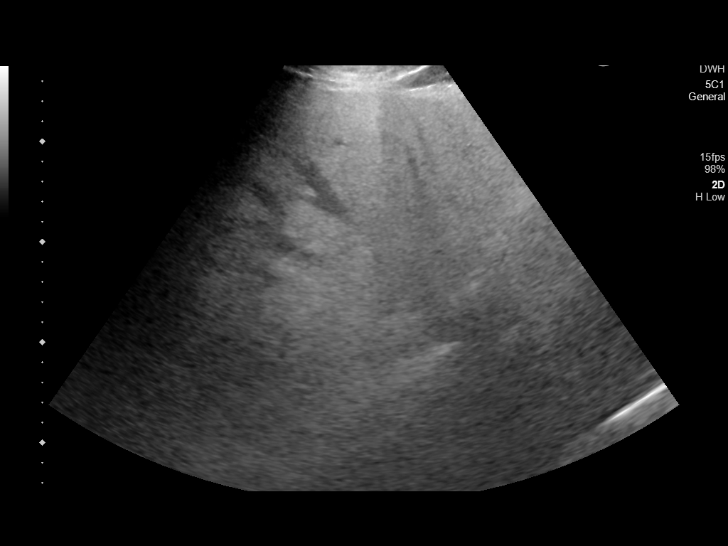
[im 38/38]
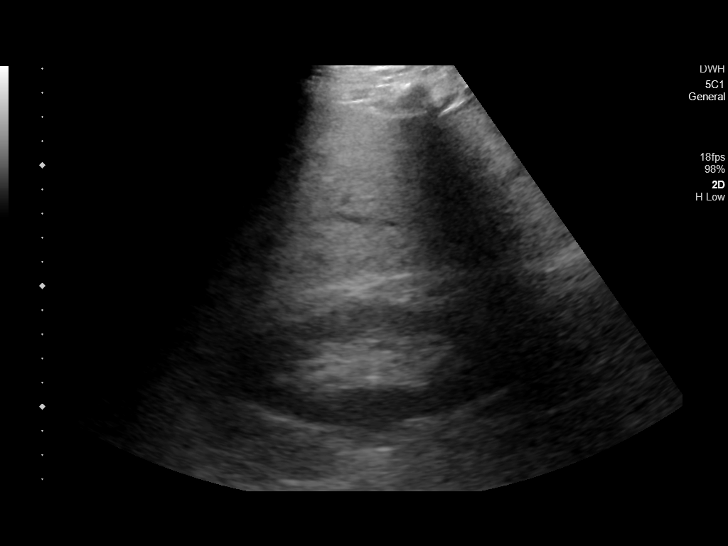

[14 of 25 positions shown; findings below may reference images not displayed]

FINDINGS: Gallbladder:

Cholelithiasis with sludge and small stones in the gallbladder.
Largest stone measures about 4 mm. There is evidence of mild
gallbladder wall thickening to 4 mm with pericholecystic edema.
Murphy's sign is positive.

Common bile duct:

Diameter: 4 mm, normal

Liver:

Diffusely increased hepatic parenchymal echotexture consistent with
fatty infiltration. No focal lesions identified. Portal vein is
patent on color Doppler imaging with normal direction of blood flow
towards the liver.

Other: None.
IMPRESSION: Cholelithiasis, gallbladder wall thickening, and edema with positive
Murphy's sign. Findings are compatible with acute cholecystitis.

## 2021-04-30 SURGERY — CHOLECYSTECTOMY, ROBOT-ASSISTED, LAPAROSCOPIC
Anesthesia: General | Site: Abdomen

## 2021-04-30 MED ORDER — SODIUM CHLORIDE 0.9 % IV SOLN: INTRAVENOUS | Status: DC

## 2021-04-30 MED ORDER — KETOROLAC TROMETHAMINE 30 MG/ML IJ SOLN
15.0000 mg | Freq: Four times a day (QID) | INTRAMUSCULAR | Status: DC | PRN
  Administered 2021-05-01 – 2021-05-02 (×2): 15 mg via INTRAVENOUS
  Filled 2021-04-30 (×2): qty 1

## 2021-04-30 MED ORDER — ONDANSETRON HCL 4 MG PO TABS: 4.0000 mg | ORAL_TABLET | Freq: Four times a day (QID) | ORAL | Status: DC | PRN

## 2021-04-30 MED ORDER — ADULT MULTIVITAMIN W/MINERALS CH
1.0000 | ORAL_TABLET | Freq: Every day | ORAL | Status: DC
  Administered 2021-05-01 – 2021-05-02 (×2): 1 via ORAL
  Filled 2021-04-30 (×2): qty 1

## 2021-04-30 MED ORDER — LORATADINE 10 MG PO TABS
10.0000 mg | ORAL_TABLET | Freq: Every day | ORAL | Status: DC
  Filled 2021-04-30 (×2): qty 1

## 2021-04-30 MED ORDER — ACETAMINOPHEN 10 MG/ML IV SOLN
INTRAVENOUS | Status: DC | PRN
  Administered 2021-04-30: 1000 mg via INTRAVENOUS

## 2021-04-30 MED ORDER — DEXAMETHASONE SODIUM PHOSPHATE 10 MG/ML IJ SOLN
INTRAMUSCULAR | Status: AC
  Filled 2021-04-30: qty 1

## 2021-04-30 MED ORDER — BUPIVACAINE-EPINEPHRINE (PF) 0.25% -1:200000 IJ SOLN
INTRAMUSCULAR | Status: DC | PRN
  Administered 2021-04-30: 30 mL

## 2021-04-30 MED ORDER — LIDOCAINE HCL (PF) 2 % IJ SOLN
INTRAMUSCULAR | Status: AC
  Filled 2021-04-30: qty 5

## 2021-04-30 MED ORDER — OXYCODONE HCL 5 MG PO TABS
5.0000 mg | ORAL_TABLET | ORAL | Status: DC | PRN
Start: 2021-04-30 — End: 2021-05-02

## 2021-04-30 MED ORDER — ROCURONIUM BROMIDE 10 MG/ML (PF) SYRINGE
PREFILLED_SYRINGE | INTRAVENOUS | Status: AC
  Filled 2021-04-30: qty 10

## 2021-04-30 MED ORDER — PANTOPRAZOLE SODIUM 40 MG PO TBEC
40.0000 mg | DELAYED_RELEASE_TABLET | Freq: Every day | ORAL | Status: DC
  Administered 2021-04-30 – 2021-05-02 (×3): 40 mg via ORAL
  Filled 2021-04-30 (×3): qty 1

## 2021-04-30 MED ORDER — ONDANSETRON HCL 4 MG/2ML IJ SOLN: 4.0000 mg | Freq: Once | INTRAMUSCULAR | Status: DC | PRN

## 2021-04-30 MED ORDER — DEXAMETHASONE SODIUM PHOSPHATE 10 MG/ML IJ SOLN
INTRAMUSCULAR | Status: DC | PRN
  Administered 2021-04-30: 5 mg via INTRAVENOUS

## 2021-04-30 MED ORDER — SODIUM CHLORIDE 0.9 % IV SOLN: INTRAVENOUS | Status: DC | PRN

## 2021-04-30 MED ORDER — VITAMIN D 25 MCG (1000 UNIT) PO TABS
1000.0000 [IU] | ORAL_TABLET | Freq: Every day | ORAL | Status: DC
  Administered 2021-04-30 – 2021-05-02 (×3): 1000 [IU] via ORAL
  Filled 2021-04-30 (×3): qty 1

## 2021-04-30 MED ORDER — ENOXAPARIN SODIUM 60 MG/0.6ML IJ SOSY: 0.5000 mg/kg | PREFILLED_SYRINGE | INTRAMUSCULAR | Status: DC

## 2021-04-30 MED ORDER — MUPIROCIN 2 % EX OINT
1.0000 "application " | TOPICAL_OINTMENT | Freq: Two times a day (BID) | CUTANEOUS | Status: DC
  Administered 2021-05-01 (×2): 1 via NASAL
  Filled 2021-04-30: qty 22

## 2021-04-30 MED ORDER — FENTANYL CITRATE (PF) 100 MCG/2ML IJ SOLN
INTRAMUSCULAR | Status: AC
  Filled 2021-04-30: qty 2

## 2021-04-30 MED ORDER — PROPOFOL 10 MG/ML IV BOLUS
INTRAVENOUS | Status: DC | PRN
  Administered 2021-04-30: 150 mg via INTRAVENOUS
  Administered 2021-04-30: 20 mg via INTRAVENOUS

## 2021-04-30 MED ORDER — ONDANSETRON HCL 4 MG/2ML IJ SOLN
4.0000 mg | Freq: Once | INTRAMUSCULAR | Status: DC
  Filled 2021-04-30: qty 2

## 2021-04-30 MED ORDER — PIPERACILLIN-TAZOBACTAM 3.375 G IVPB 30 MIN
3.3750 g | Freq: Once | INTRAVENOUS | Status: AC
  Administered 2021-04-30: 3.375 g via INTRAVENOUS
  Filled 2021-04-30: qty 50

## 2021-04-30 MED ORDER — ONDANSETRON HCL 4 MG/2ML IJ SOLN: 4.0000 mg | Freq: Four times a day (QID) | INTRAMUSCULAR | Status: DC | PRN

## 2021-04-30 MED ORDER — ONDANSETRON HCL 4 MG/2ML IJ SOLN
INTRAMUSCULAR | Status: DC | PRN
  Administered 2021-04-30: 4 mg via INTRAVENOUS

## 2021-04-30 MED ORDER — EPHEDRINE 5 MG/ML INJ
INTRAVENOUS | Status: AC
  Filled 2021-04-30: qty 10

## 2021-04-30 MED ORDER — SUGAMMADEX SODIUM 500 MG/5ML IV SOLN
INTRAVENOUS | Status: DC | PRN
  Administered 2021-04-30: 220 mg via INTRAVENOUS

## 2021-04-30 MED ORDER — ACETAMINOPHEN 650 MG RE SUPP: 650.0000 mg | Freq: Four times a day (QID) | RECTAL | Status: DC | PRN

## 2021-04-30 MED ORDER — MORPHINE SULFATE (PF) 2 MG/ML IV SOLN: 2.0000 mg | INTRAVENOUS | Status: DC | PRN

## 2021-04-30 MED ORDER — PROPOFOL 10 MG/ML IV BOLUS
INTRAVENOUS | Status: AC
  Filled 2021-04-30: qty 20

## 2021-04-30 MED ORDER — ACETAMINOPHEN 325 MG PO TABS: 650.0000 mg | ORAL_TABLET | Freq: Four times a day (QID) | ORAL | Status: DC | PRN

## 2021-04-30 MED ORDER — INSULIN ASPART 100 UNIT/ML IJ SOLN: 0.0000 [IU] | Freq: Three times a day (TID) | INTRAMUSCULAR | Status: DC

## 2021-04-30 MED ORDER — FENTANYL CITRATE (PF) 100 MCG/2ML IJ SOLN
INTRAMUSCULAR | Status: DC | PRN
  Administered 2021-04-30: 50 ug via INTRAVENOUS
  Administered 2021-04-30 (×4): 25 ug via INTRAVENOUS

## 2021-04-30 MED ORDER — MIDAZOLAM HCL 2 MG/2ML IJ SOLN
INTRAMUSCULAR | Status: DC | PRN
  Administered 2021-04-30: 2 mg via INTRAVENOUS

## 2021-04-30 MED ORDER — PIPERACILLIN-TAZOBACTAM 3.375 G IVPB
3.3750 g | Freq: Three times a day (TID) | INTRAVENOUS | Status: DC
  Administered 2021-04-30 – 2021-05-02 (×6): 3.375 g via INTRAVENOUS
  Filled 2021-04-30 (×6): qty 50

## 2021-04-30 MED ORDER — AZELASTINE HCL 0.1 % NA SOLN
1.0000 | Freq: Two times a day (BID) | NASAL | Status: DC
  Filled 2021-04-30: qty 30

## 2021-04-30 MED ORDER — LIDOCAINE HCL (CARDIAC) PF 100 MG/5ML IV SOSY
PREFILLED_SYRINGE | INTRAVENOUS | Status: DC | PRN
  Administered 2021-04-30: 100 mg via INTRAVENOUS

## 2021-04-30 MED ORDER — OMEGA-3-ACID ETHYL ESTERS 1 G PO CAPS
1.0000 g | ORAL_CAPSULE | Freq: Every day | ORAL | Status: DC
  Administered 2021-05-01 – 2021-05-02 (×2): 1 g via ORAL
  Filled 2021-04-30 (×2): qty 1

## 2021-04-30 MED ORDER — METOPROLOL SUCCINATE ER 50 MG PO TB24
50.0000 mg | ORAL_TABLET | Freq: Every day | ORAL | Status: DC
  Administered 2021-04-30 – 2021-05-02 (×3): 50 mg via ORAL
  Filled 2021-04-30 (×3): qty 1

## 2021-04-30 MED ORDER — ASCORBIC ACID 500 MG PO TABS
1000.0000 mg | ORAL_TABLET | Freq: Every day | ORAL | Status: DC
  Administered 2021-04-30 – 2021-05-02 (×3): 1000 mg via ORAL
  Filled 2021-04-30 (×3): qty 2

## 2021-04-30 MED ORDER — TRAZODONE HCL 50 MG PO TABS: 25.0000 mg | ORAL_TABLET | Freq: Every evening | ORAL | Status: DC | PRN

## 2021-04-30 MED ORDER — MIDAZOLAM HCL 2 MG/2ML IJ SOLN
INTRAMUSCULAR | Status: AC
  Filled 2021-04-30: qty 2

## 2021-04-30 MED ORDER — MORPHINE SULFATE (PF) 4 MG/ML IV SOLN
4.0000 mg | Freq: Once | INTRAVENOUS | Status: DC
  Filled 2021-04-30: qty 1

## 2021-04-30 MED ORDER — BUPIVACAINE-EPINEPHRINE (PF) 0.25% -1:200000 IJ SOLN
INTRAMUSCULAR | Status: AC
  Filled 2021-04-30: qty 30

## 2021-04-30 MED ORDER — 0.9 % SODIUM CHLORIDE (POUR BTL) OPTIME
TOPICAL | Status: DC | PRN
  Administered 2021-04-30: 500 mL

## 2021-04-30 MED ORDER — ACETAMINOPHEN 500 MG PO TABS: 1000.0000 mg | ORAL_TABLET | Freq: Four times a day (QID) | ORAL | Status: DC | PRN

## 2021-04-30 MED ORDER — HYDROMORPHONE HCL 1 MG/ML IJ SOLN: 0.5000 mg | INTRAMUSCULAR | Status: DC | PRN

## 2021-04-30 MED ORDER — GUAIFENESIN ER 600 MG PO TB12
600.0000 mg | ORAL_TABLET | Freq: Two times a day (BID) | ORAL | Status: DC
  Administered 2021-05-01: 600 mg via ORAL
  Filled 2021-04-30 (×4): qty 1

## 2021-04-30 MED ORDER — VITAMIN E 180 MG (400 UNIT) PO CAPS
400.0000 [IU] | ORAL_CAPSULE | Freq: Every day | ORAL | Status: DC
  Administered 2021-05-01 – 2021-05-02 (×2): 400 [IU] via ORAL
  Filled 2021-04-30 (×2): qty 1

## 2021-04-30 MED ORDER — MAGNESIUM HYDROXIDE 400 MG/5ML PO SUSP: 30.0000 mL | Freq: Every day | ORAL | Status: DC | PRN

## 2021-04-30 MED ORDER — ACETAMINOPHEN 10 MG/ML IV SOLN
INTRAVENOUS | Status: AC
  Filled 2021-04-30: qty 100

## 2021-04-30 MED ORDER — ROCURONIUM BROMIDE 100 MG/10ML IV SOLN
INTRAVENOUS | Status: DC | PRN
  Administered 2021-04-30 (×3): 10 mg via INTRAVENOUS
  Administered 2021-04-30: 60 mg via INTRAVENOUS

## 2021-04-30 MED ORDER — EPHEDRINE SULFATE 50 MG/ML IJ SOLN
INTRAMUSCULAR | Status: DC | PRN
  Administered 2021-04-30: 5 mg via INTRAVENOUS
  Administered 2021-04-30: 10 mg via INTRAVENOUS

## 2021-04-30 MED ORDER — COQ10 200 MG PO CAPS: 200.0000 mg | ORAL_CAPSULE | Freq: Every day | ORAL | Status: DC

## 2021-04-30 MED ORDER — FENTANYL CITRATE (PF) 100 MCG/2ML IJ SOLN
25.0000 ug | INTRAMUSCULAR | Status: DC | PRN
Start: 2021-04-30 — End: 2021-04-30

## 2021-04-30 MED ORDER — INDOCYANINE GREEN 25 MG IV SOLR
2.5000 mg | INTRAVENOUS | Status: AC
  Administered 2021-04-30: 2.5 mg via INTRAVENOUS
  Filled 2021-04-30: qty 1

## 2021-04-30 MED ORDER — TAMSULOSIN HCL 0.4 MG PO CAPS
0.4000 mg | ORAL_CAPSULE | Freq: Every day | ORAL | Status: DC
  Administered 2021-04-30 – 2021-05-02 (×3): 0.4 mg via ORAL
  Filled 2021-04-30 (×3): qty 1

## 2021-04-30 MED ORDER — ONDANSETRON HCL 4 MG/2ML IJ SOLN
INTRAMUSCULAR | Status: AC
  Filled 2021-04-30: qty 2

## 2021-04-30 SURGICAL SUPPLY — 64 items
BAG INFUSER PRESSURE 100CC (MISCELLANEOUS) IMPLANT
BULB RESERV EVAC DRAIN JP 100C (MISCELLANEOUS) ×2 IMPLANT
CANNULA REDUC XI 12-8 STAPL (CANNULA) ×1
CANNULA REDUCER 12-8 DVNC XI (CANNULA) ×1 IMPLANT
CHLORAPREP W/TINT 26 (MISCELLANEOUS) ×2 IMPLANT
CLIP VESOLOCK MED LG 6/CT (CLIP) ×2 IMPLANT
CUP MEDICINE 2OZ PLAST GRAD ST (MISCELLANEOUS) ×2 IMPLANT
DECANTER SPIKE VIAL GLASS SM (MISCELLANEOUS) IMPLANT
DEFOGGER SCOPE WARMER CLEARIFY (MISCELLANEOUS) ×2 IMPLANT
DERMABOND ADVANCED (GAUZE/BANDAGES/DRESSINGS) ×1
DERMABOND ADVANCED .7 DNX12 (GAUZE/BANDAGES/DRESSINGS) ×1 IMPLANT
DRAIN CHANNEL JP 19F (MISCELLANEOUS) ×2 IMPLANT
DRAPE ARM DVNC X/XI (DISPOSABLE) ×4 IMPLANT
DRAPE COLUMN DVNC XI (DISPOSABLE) ×1 IMPLANT
DRAPE DA VINCI XI ARM (DISPOSABLE) ×4
DRAPE DA VINCI XI COLUMN (DISPOSABLE) ×1
ELECT CAUTERY BLADE TIP 2.5 (TIP) ×2
ELECT REM PT RETURN 9FT ADLT (ELECTROSURGICAL) ×2
ELECTRODE CAUTERY BLDE TIP 2.5 (TIP) ×1 IMPLANT
ELECTRODE REM PT RTRN 9FT ADLT (ELECTROSURGICAL) ×1 IMPLANT
GAUZE 4X4 16PLY ~~LOC~~+RFID DBL (SPONGE) ×2 IMPLANT
GLOVE SURG SYN 7.0 (GLOVE) ×4 IMPLANT
GLOVE SURG SYN 7.5  E (GLOVE) ×2
GLOVE SURG SYN 7.5 E (GLOVE) ×2 IMPLANT
GOWN STRL REUS W/ TWL LRG LVL3 (GOWN DISPOSABLE) ×4 IMPLANT
GOWN STRL REUS W/TWL LRG LVL3 (GOWN DISPOSABLE) ×4
IRRIGATOR SUCT 8 DISP DVNC XI (IRRIGATION / IRRIGATOR) ×1 IMPLANT
IRRIGATOR SUCTION 8MM XI DISP (IRRIGATION / IRRIGATOR) ×1
IV NS 1000ML (IV SOLUTION)
IV NS 1000ML BAXH (IV SOLUTION) IMPLANT
KIT PINK PAD W/HEAD ARE REST (MISCELLANEOUS) ×2 IMPLANT
KIT PINK PAD W/HEAD ARM REST (MISCELLANEOUS) ×1 IMPLANT
LABEL OR SOLS (LABEL) ×2 IMPLANT
MANIFOLD NEPTUNE II (INSTRUMENTS) ×2 IMPLANT
NEEDLE HYPO 22GX1.5 SAFETY (NEEDLE) ×2 IMPLANT
NS IRRIG 500ML POUR BTL (IV SOLUTION) ×2 IMPLANT
OBTURATOR OPTICAL STANDARD 8MM (TROCAR) ×1
OBTURATOR OPTICAL STND 8 DVNC (TROCAR) ×1
OBTURATOR OPTICALSTD 8 DVNC (TROCAR) ×1 IMPLANT
PACK LAP CHOLECYSTECTOMY (MISCELLANEOUS) ×2 IMPLANT
PENCIL ELECTRO HAND CTR (MISCELLANEOUS) ×2 IMPLANT
POUCH SPECIMEN RETRIEVAL 10MM (ENDOMECHANICALS) ×2 IMPLANT
RELOAD STAPLER 3.5X45 BLU DVNC (STAPLE) ×1 IMPLANT
SEAL CANN UNIV 5-8 DVNC XI (MISCELLANEOUS) ×3 IMPLANT
SEAL XI 5MM-8MM UNIVERSAL (MISCELLANEOUS) ×3
SET TUBE SMOKE EVAC HIGH FLOW (TUBING) ×2 IMPLANT
SOLUTION ELECTROLUBE (MISCELLANEOUS) ×2 IMPLANT
SPONGE T-LAP 18X18 ~~LOC~~+RFID (SPONGE) IMPLANT
SPONGE T-LAP 4X18 ~~LOC~~+RFID (SPONGE) ×2 IMPLANT
STAPLER 45 DA VINCI SURE FORM (STAPLE) ×1
STAPLER 45 SUREFORM DVNC (STAPLE) ×1 IMPLANT
STAPLER CANNULA SEAL DVNC XI (STAPLE) IMPLANT
STAPLER CANNULA SEAL XI (STAPLE)
STAPLER RELOAD 3.5X45 BLU DVNC (STAPLE) ×1
STAPLER RELOAD 3.5X45 BLUE (STAPLE) ×1
SUT ETHILON 3-0 FS-10 30 BLK (SUTURE) ×2
SUT MNCRL AB 4-0 PS2 18 (SUTURE) ×2 IMPLANT
SUT SILK 2 0 SH (SUTURE) ×2 IMPLANT
SUT VIC AB 3-0 SH 27 (SUTURE)
SUT VIC AB 3-0 SH 27X BRD (SUTURE) IMPLANT
SUT VICRYL 0 AB UR-6 (SUTURE) ×4 IMPLANT
SUTURE EHLN 3-0 FS-10 30 BLK (SUTURE) ×1 IMPLANT
TAPE TRANSPORE STRL 2 31045 (GAUZE/BANDAGES/DRESSINGS) ×2 IMPLANT
TROCAR BALLN GELPORT 12X130M (ENDOMECHANICALS) ×2 IMPLANT

## 2021-04-30 NOTE — Consult Note (Signed)
Jackson [place], [name] [address], [place], [place], [place], [id number], [place], [place], [place], [address] Phone: [phone]  Fax: [phone]  Consultation  Referring Provider:     Dr. Sidney [name] Primary Care Physician:  Jackson [name], Jackson [name] Reason for Consultation:     Pancreatitis  Date of Admission:  04/30/2021 Date of Consultation:  04/30/2021         HPI:   Jackson [name] is a 70 y.o. male presented with 3 to 4-day history of periumbilical abdominal pain, sharp, nonradiating, currently 3/10.  Patient states after the pain started on Saturday, it subsided later on that day but reoccurred on Monday and since it got worse he came to the hospital.  No aggravating factors.  Pain medications to relieve it.  No associated nausea or vomiting, or altered bowel habits.  No blood in stool.  No prior history of similar symptoms.  As far as any new medications, states he was started on Flomax about 2 months ago that was new for him, red yeast rice supplement that he started a month ago, and no other new medications otherwise.  Past Medical History:  Diagnosis Date   Actinic keratosis    Allergy    Anemia    Arthritis    Basal cell carcinoma    Cancer (HCC)    BASAL CELL-FACE   Complication of anesthesia    PT IS A RED HEAD AND STATES IT TAKES ALOT MORE ANESTHESIA TO SEDATE HIM   Diabetes mellitus without complication (HCC)    Dysrhythmia    SVT   Fatty liver    GERD (gastroesophageal reflux disease)    Hyperlipidemia    Melanoma ([facility]) 02/02/2019   Left forearm. Superficial spreading. Tumor thickness 0.17mm, anatomic level III. Excised: 02/22/2019, margins free.   Multiple thyroid nodules    Rheumatic fever    Sleep apnea 1997   Uses C-Pap machine   SVT (supraventricular tachycardia) (HCC)    Thyroid nodule     Past Surgical History:  Procedure Laterality Date   APPENDECTOMY  1965   BICEPS TENDON REPAIR Right 05/2015   COLONOSCOPY  2016   LUMBAR  FUSION  2015   L4/L5   [place]    Prior to Admission medications   Medication Sig Start Date End Date Taking? Authorizing Provider  azelastine (ASTELIN) 0.1 % nasal spray USE 1 SPRAY IN EACH NOSTRIL TWICE A DAY AS DIRECTED Patient taking differently: Place 1 spray into both nostrils 2 (two) times daily as needed for rhinitis or allergies. 12/10/20  Yes Jackson [name], Jackson [name]  cetirizine (ZYRTEC) 10 MG tablet Take 10 mg by mouth at bedtime.    Yes Provider, Historical, [name]  cholecalciferol (VITAMIN D3) 25 MCG (1000 UT) tablet Take 1,000 Units by mouth daily.   Yes Provider, Historical, [name]  Coenzyme Q10 (COQ10) 200 MG CAPS Take 200 mg by mouth daily.    Yes Provider, Historical, [name]  Continuous Blood Gluc Sensor (FREESTYLE LIBRE 2 SENSOR) MISC Use to check glucose Q8H 10/11/20  Yes Jackson [name], Jackson [name]  metFORMIN (GLUCOPHAGE) 1000 MG tablet TAKE 1 TABLET TWICE A DAY WITH MEALS Patient taking differently: Take 1,000 mg by mouth 2 (two) times daily with a meal. 03/04/21  Yes Jackson [name], Jackson [name]  metoprolol succinate (TOPROL-XL) 50 MG 24 hr tablet TAKE 1 TABLET DAILY WITH OR IMMEDIATELY FOLLOWING A MEAL (DOSE CHANGE) Patient taking  differently: Take 50 mg by mouth daily. 12/02/20  Yes Jackson [name], Jackson [name]  Multiple Vitamins-Minerals (MULTIVITAMIN ADULT PO) Take 1 tablet by mouth daily.    Yes Provider, Historical, [name]  Omega-3 Fatty Acids (FISH OIL) 1200 MG CPDR Take 1 tablet by mouth daily.   Yes Provider, Historical, [name]  omeprazole (PRILOSEC) 40 MG capsule TAKE 1 CAPSULE DAILY Patient taking differently: Take 40 mg by mouth at bedtime. 02/18/21  Yes Jackson [name], Jackson [name]  Red Yeast Rice 600 MG TABS Take 600 mg by mouth 2 (two) times daily with a meal.   Yes Provider, Historical, [name]  tamsulosin (FLOMAX) 0.4 MG CAPS capsule Take 1 capsule (0.4 mg total) by mouth daily. Patient taking differently: Take 0.4 mg by mouth  at bedtime. 01/16/21  Yes [place], [name]  TRULICITY 9.62 EZ/6.6QH SOPN INJECT 0.5 ML (0.75 MG TOTAL) UNDER THE SKIN ONCE A WEEK Patient taking differently: Inject 0.75 mg into the skin every Tuesday. 12/02/20  Yes Jackson [name], Jackson [name]  vitamin C (ASCORBIC ACID) 500 MG tablet Take 1,000 mg by mouth daily.    Yes Provider, Historical, [name]  Vitamin E 400 units TABS Take 400 Units by mouth daily.    Yes Provider, Historical, [name]  triamcinolone ointment (KENALOG) 0.1 % Apply 1 application topically daily. As needed and cover with bandaid. Avoid face, groin, underarms. Patient not taking: No sig reported 01/28/21   Alfonso Patten, [name]    Family History  Problem Relation Age of Onset   Arthritis Mother    Lung cancer Mother    Hyperlipidemia Mother    Diabetes Mother    Arthritis Father    Diabetes Father    Lung disease Father    Hyperlipidemia Maternal Grandmother    Alcohol abuse Maternal Grandfather    Hyperlipidemia Maternal Grandfather    Drug abuse Cousin    Hyperlipidemia Other        maternal side of family      Social History   Tobacco Use   Smoking status: Never   Smokeless tobacco: Former    Types: Snuff  Vaping Use   Vaping Use: Never used  Substance Use Topics   Alcohol use: Yes    Alcohol/week: 1.0 - 2.0 standard drink    Types: 1 - 2 Cans of beer per week    Comment: OCC   Drug use: No    Allergies as of 04/30/2021 - Review Complete 03/28/2021  Allergen Reaction Noted   Januvia [sitagliptin]  12/19/2019   Lipitor [atorvastatin]  12/17/2019   Statins Other (See Comments) 05/26/2017    Review of Systems:    All systems reviewed and negative except where noted in HPI.   Physical Exam:  Constitutional: General:   Alert,  Well-developed, well-nourished, pleasant and cooperative in NAD BP 126/80   Pulse 63   Temp 98.5 F (36.9 C) (Oral) Comment: Patient's baseline is 97.7  Resp 16   Ht 6\' 1"  (1.854 m)   Wt 105.2 kg   SpO2 96%   BMI 30.61 kg/m    Eyes:  Sclera clear, no icterus.   Conjunctiva pink. PERRLA  Ears:  No scars, lesions or masses, Normal auditory acuity. Nose:  No deformity, discharge, or lesions. Mouth:  No deformity or lesions, oropharynx pink & moist.  Neck:  Supple; no masses or thyromegaly.  Respiratory: Normal respiratory effort, Normal percussion  Gastrointestinal:  Normal bowel sounds.  No bruits.  Soft, tender to palpation, periumbilical to bilateral lower  quadrant area, and non-distended without masses, hepatosplenomegaly or hernias noted.  No guarding or rebound tenderness.     Cardiac: No clubbing or edema.  No cyanosis. Normal posterior tibial pedal pulses noted.  Lymphatic:  No significant cervical or axillary adenopathy.  Psych:  Alert and cooperative. Normal mood and affect.  Musculoskeletal:  Head normocephalic, atraumatic. Symmetrical without gross deformities. 5/5 Upper and Lower extremity strength bilaterally.  Skin: Warm. Intact without significant lesions or rashes. No jaundice.  Neurologic:  Face symmetrical, tongue midline, Normal sensation to touch;  grossly normal neurologically.  Psych:  Alert and oriented x3, Alert and cooperative. Normal mood and affect.   LAB RESULTS: Recent Labs    04/30/21 0038  WBC 10.8*  HGB 14.1  HCT 40.9  PLT 218   BMET Recent Labs    04/30/21 0038  NA 133*  K 4.7  CL 102  CO2 20*  GLUCOSE 163*  BUN 18  CREATININE 1.03  CALCIUM 8.3*   LFT Recent Labs    04/30/21 0847  PROT 6.6  ALBUMIN 3.4*  AST 77*  ALT 136*  ALKPHOS 121  BILITOT 1.7*  BILIDIR 0.7*  IBILI 1.0*   PT/INR Recent Labs    04/30/21 0250  LABPROT 13.9  INR 1.1    STUDIES: MR ABDOMEN MRCP WO CONTRAST  Result Date: 04/30/2021 CLINICAL DATA:  70 year old male with history of right upper quadrant abdominal pain. Ultrasound was indicative of acute cholecystitis. EXAM: MRI ABDOMEN WITHOUT CONTRAST  (INCLUDING MRCP) TECHNIQUE: Multiplanar multisequence MR imaging of  the abdomen was performed. Heavily T2-weighted images of the biliary and pancreatic ducts were obtained, and three-dimensional MRCP images were rendered by post processing. COMPARISON:  No prior abdominal MRI. Abdominal ultrasound 04/30/2021. FINDINGS: Comment: Today's study is limited for detection and characterization of visceral and/or vascular lesions by lack of IV gadolinium. Lower chest: Unremarkable. Hepatobiliary: Diffuse loss of signal intensity throughout the hepatic parenchyma on out of phase dual echo images, indicative of a background of hepatic steatosis. No suspicious cystic or solid hepatic lesions are confidently identified on today's noncontrast examination. Tiny filling defects are present within the lumen of the gallbladder, compatible with small gallstones. Gallbladder is moderately distended. Gallbladder wall appears thickened and edematous measuring up to 11 mm in thickness with trace volume of pericholecystic fluid. There are no filling defects within the common bile duct to suggest choledocholithiasis. Common bile duct measures 3 mm in the porta hepatis. No intrahepatic biliary ductal dilatation noted on MRCP images. Pancreas: In the inferior aspect of the pancreatic head (axial image 27 of series 7) there is a 9 mm T2 hyperintense lesion. In the pancreatic body (axial image 16 of series 7) there is an additional 6 mm T2 hyperintense lesion. No definite solid appearing pancreatic mass noted on today's noncontrast examination. No pancreatic ductal dilatation noted on MRCP images. No peripancreatic fluid collections or inflammatory changes. Spleen:  Unremarkable. Adrenals/Urinary Tract: There are several T1 hypointense and T2 hyperintense lesions in the kidneys, measuring up to 2.9 cm in diameter in the medial aspect of the upper pole of the right kidney. In addition, in the upper pole of the right kidney laterally there is a 1.3 cm lesion (axial image 32 of series 19) which is mildly T1  hyperintense and T2 hyperintense. No hydroureteronephrosis in the visualized portions of the abdomen. Bilateral adrenal glands are normal in appearance. Stomach/Bowel: Visualized portions are unremarkable. Vascular/Lymphatic: No aneurysm identified in the visualized abdominal vasculature. No lymphadenopathy noted in the abdomen.  Other: No significant volume of ascites noted in the visualized portions of the peritoneal cavity. Musculoskeletal: No aggressive appearing osseous lesions are noted in the visualize portions of the skeleton. Signal void noted in the lumbar spine related to indwelling spinal fixation hardware. IMPRESSION: 1. Study is positive for cholelithiasis with imaging findings suggestive of acute cholecystitis. No evidence of choledocholithiasis. No findings of biliary tract obstruction. 2. Subcentimeter cystic lesions in the pancreatic head and body. These are statistically likely to represent focal areas of side branch ectasia or small pancreatic pseudocysts, however, the possibility of side branch IPMN (intraductal papillary mucinous neoplasm is not excluded). Recommend follow up pre and post contrast MRI/MRCP or pancreatic protocol CT in 2 years. This recommendation follows ACR consensus guidelines: Management of Incidental Pancreatic Cysts: A White Paper of the ACR Incidental Findings Committee. J Am Coll Radiol 6269;48:546-270. 3. Multiple renal lesions bilaterally, as above, incompletely characterized on today's noncontrast examination but likely to represent Bosniak class 1 and Bosniak class 2 cysts. Attention at time of repeat abdominal MRI is recommended to ensure stability. 4. Hepatic steatosis. Electronically Signed   By: Vinnie Langton M.D.   On: 04/30/2021 06:55   MR 3D Recon At Scanner  Result Date: 04/30/2021 CLINICAL DATA:  70 year old male with history of right upper quadrant abdominal pain. Ultrasound was indicative of acute cholecystitis. EXAM: MRI ABDOMEN WITHOUT CONTRAST   (INCLUDING MRCP) TECHNIQUE: Multiplanar multisequence MR imaging of the abdomen was performed. Heavily T2-weighted images of the biliary and pancreatic ducts were obtained, and three-dimensional MRCP images were rendered by post processing. COMPARISON:  No prior abdominal MRI. Abdominal ultrasound 04/30/2021. FINDINGS: Comment: Today's study is limited for detection and characterization of visceral and/or vascular lesions by lack of IV gadolinium. Lower chest: Unremarkable. Hepatobiliary: Diffuse loss of signal intensity throughout the hepatic parenchyma on out of phase dual echo images, indicative of a background of hepatic steatosis. No suspicious cystic or solid hepatic lesions are confidently identified on today's noncontrast examination. Tiny filling defects are present within the lumen of the gallbladder, compatible with small gallstones. Gallbladder is moderately distended. Gallbladder wall appears thickened and edematous measuring up to 11 mm in thickness with trace volume of pericholecystic fluid. There are no filling defects within the common bile duct to suggest choledocholithiasis. Common bile duct measures 3 mm in the porta hepatis. No intrahepatic biliary ductal dilatation noted on MRCP images. Pancreas: In the inferior aspect of the pancreatic head (axial image 27 of series 7) there is a 9 mm T2 hyperintense lesion. In the pancreatic body (axial image 16 of series 7) there is an additional 6 mm T2 hyperintense lesion. No definite solid appearing pancreatic mass noted on today's noncontrast examination. No pancreatic ductal dilatation noted on MRCP images. No peripancreatic fluid collections or inflammatory changes. Spleen:  Unremarkable. Adrenals/Urinary Tract: There are several T1 hypointense and T2 hyperintense lesions in the kidneys, measuring up to 2.9 cm in diameter in the medial aspect of the upper pole of the right kidney. In addition, in the upper pole of the right kidney laterally there is a  1.3 cm lesion (axial image 32 of series 19) which is mildly T1 hyperintense and T2 hyperintense. No hydroureteronephrosis in the visualized portions of the abdomen. Bilateral adrenal glands are normal in appearance. Stomach/Bowel: Visualized portions are unremarkable. Vascular/Lymphatic: No aneurysm identified in the visualized abdominal vasculature. No lymphadenopathy noted in the abdomen. Other: No significant volume of ascites noted in the visualized portions of the peritoneal cavity. Musculoskeletal: No aggressive  appearing osseous lesions are noted in the visualize portions of the skeleton. Signal void noted in the lumbar spine related to indwelling spinal fixation hardware. IMPRESSION: 1. Study is positive for cholelithiasis with imaging findings suggestive of acute cholecystitis. No evidence of choledocholithiasis. No findings of biliary tract obstruction. 2. Subcentimeter cystic lesions in the pancreatic head and body. These are statistically likely to represent focal areas of side branch ectasia or small pancreatic pseudocysts, however, the possibility of side branch IPMN (intraductal papillary mucinous neoplasm is not excluded). Recommend follow up pre and post contrast MRI/MRCP or pancreatic protocol CT in 2 years. This recommendation follows ACR consensus guidelines: Management of Incidental Pancreatic Cysts: A White Paper of the ACR Incidental Findings Committee. J Am Coll Radiol 9528;41:324-401. 3. Multiple renal lesions bilaterally, as above, incompletely characterized on today's noncontrast examination but likely to represent Bosniak class 1 and Bosniak class 2 cysts. Attention at time of repeat abdominal MRI is recommended to ensure stability. 4. Hepatic steatosis. Electronically Signed   By: Vinnie Langton M.D.   On: 04/30/2021 06:55   US Abdomen Limited RUQ (LIVER/GB)  Result Date: 04/30/2021 CLINICAL DATA:  Elevated liver function studies. Elevated bilirubin. Right upper quadrant pain for 4  days. EXAM: ULTRASOUND ABDOMEN LIMITED RIGHT UPPER QUADRANT COMPARISON:  01/27/2018 FINDINGS: Gallbladder: Cholelithiasis with sludge and small stones in the gallbladder. Largest stone measures about 4 mm. There is evidence of mild gallbladder wall thickening to 4 mm with pericholecystic edema. Murphy's sign is positive. Common bile duct: Diameter: 4 mm, normal Liver: Diffusely increased hepatic parenchymal echotexture consistent with fatty infiltration. No focal lesions identified. Portal vein is patent on color Doppler imaging with normal direction of blood flow towards the liver. Other: None. IMPRESSION: Cholelithiasis, gallbladder wall thickening, and edema with positive Murphy's sign. Findings are compatible with acute cholecystitis. Electronically Signed   By: Lucienne Capers M.D.   On: 04/30/2021 02:38      Impression / Plan:   Jackson [name] is a 70 y.o. y/o male with presentation for abdominal pain for 3 to 4 days  Clinical findings are consistent with likely gallstone pancreatitis, with no further CBD obstruction evident on MRCP  Elevation in total bilirubin on admission at 3.9, with improvement in total bilirubin today, and MRCP not showing choledocholithiasis, all consistent with likely passed gallstones  Surgical consultation indicated for evaluation of cholecystectomy.  Would defer timing of cholecystectomy to surgery team.  No evidence of ascending cholangitis  I review of his medication list does not reveal any obvious drugs associated with pancreatitis.  The new medications he stated he was recently started on over the last 1 to 2 months has been Flomax and red yeast rice.  None of these are common to cause pancreatitis upon review of literature.  In addition, given that another obvious cause of pancreatitis which is gallstone pancreatitis is evident, unlikely that these medications led to the symptoms.  Finding of subcentimeter pancreatic lesions on CT scan were also discussed  with the patient in detail.  Radiologist has recommended repeat in 2 years, however, I did discuss with the patient, given that this is a new finding, we may need to consider follow-up sooner than that to reevaluate these cysts perhaps in the next 3 to 6 months  Finding of fatty liver also seen on imaging, dating back to ultrasound of the abdomen done in 2019 as well.  Weight loss with diet and exercise over time is recommended and clinic follow-up for further work-up and  follow-up of this is also recommended  Continue to avoid hepato-toxic drugs and repeat CMP.  Labs currently improving compared to yesterday  Management for pancreatitis with IV fluids, at 200 to 250 cc an hour, with titration to prevent fluid overload  Pain control as needed  Once pain improves, okay to start diet gradually, starting with clear liquid diet and then advancing to low-fat diet, once okay with surgery and based on their surgical plans  Patient noted to be mildly hyponatremic today, which may correct with IV fluids.  Recheck and further work-up or replacement as necessary if continues to be hyponatremic  Thank you for involving me in the care of this patient.      LOS: 0 days   Virgel Manifold, [name]  04/30/2021, 12:05 PM

## 2021-04-30 NOTE — Transfer of Care (Signed)
Immediate Anesthesia Transfer of Care Note  Patient: Jackson [name]  Procedure(s) Performed: XI ROBOTIC ASSISTED LAPAROSCOPIC CHOLECYSTECTOMY (Abdomen)  Patient Location: PACU  Anesthesia Type:General  Level of Consciousness: sedated and patient cooperative  Airway & Oxygen Therapy: Patient Spontanous Breathing and Patient connected to face mask oxygen  Post-op Assessment: Report given to RN and Post -op Vital signs reviewed and stable  Post vital signs: Reviewed and stable  Last Vitals:  Vitals Value Taken Time  BP    Temp    Pulse 67 04/30/21 2114  Resp 23 04/30/21 2114  SpO2 97 % 04/30/21 2114  Vitals shown include unvalidated device data.  Last Pain:  Vitals:   04/30/21 2108  TempSrc:   PainSc: Asleep      Patients Stated Pain Goal: 0 (68/37/29 0211)  Complications: No notable events documented.

## 2021-04-30 NOTE — ED Notes (Addendum)
Pt. Ambulated to bathroom indep. Gait steady. Warm blankets provided, pt. Repositioned. Denies any further need. BP and pulse ox monitoring initiated.

## 2021-04-30 NOTE — ED Notes (Signed)
Pt to MRI

## 2021-04-30 NOTE — H&P (Signed)
[place]   PATIENT NAME: Jackson [name]    MR#:  [id number]  DATE OF BIRTH:  01/07/1951  DATE OF ADMISSION:  04/30/2021  PRIMARY CARE PHYSICIAN: [name], [name], MD   Patient is coming from: Home  REQUESTING/REFERRING PHYSICIAN: Ward, Delice Bison, DO CHIEF COMPLAINT:   Chief Complaint  Patient presents with   Abdominal Pain    HISTORY OF PRESENT ILLNESS:  Jackson [name] is a 70 y.o. Caucasian male with medical history significant for type 2 diabetes mellitus, dyslipidemia, hypertension, GERD, obstructive sleep apnea on home CPAP, rheumatic fever and SVT, who presented emergency room with a cancer of right upper quadrant abdominal pain which started on Saturday with associated nausea without vomiting.  He admitted to chills but has not had a measured fever.  He admitted to cough that is mainly dry with no wheezing or dyspnea.  No chest pain or palpitations.  No  melena or bright red bleeding per rectum.  No dysuria, oliguria or hematuria or flank pain.  ED Course: When patient came to the ER heart rate was 56 with otherwise normal vital signs.  Labs revealed mild hyponatremia and elevated AST of 111 ALT 147 and total bili of 3.9.  CBC showed no leukocytosis of 10.8.  Serum lipase came back 257.  Initially it was incalculable and it had to be evaluated. EKG as reviewed by me : Showed sinus bradycardia with a rate of 54 sinus arrhythmia and T wave inversion anteroseptally and Q waves inferiorly with T wave inversion inferiorly Imaging: Right upper quadrant ultrasound revealed cholelithiasis, and acute cholecystitis.  Contact with Dr. Hampton Abbot was made and recommendation was for MRCP that was performed and report is currently pending.  4 mg of IV morphine sulfate and 4 mg IV Zofran were given as well as IV Zosyn and hydration with normal saline.  The patient will be admitted to a progressive unit bed for further evaluation and management. PAST MEDICAL HISTORY:   Past  Medical History:  Diagnosis Date   Actinic keratosis    Allergy    Anemia    Arthritis    Basal cell carcinoma    Cancer ([place])    BASAL CELL-FACE   Complication of anesthesia    PT IS A RED HEAD AND STATES IT TAKES ALOT MORE ANESTHESIA TO SEDATE HIM   Diabetes mellitus without complication (HCC)    Dysrhythmia    SVT   Fatty liver    GERD (gastroesophageal reflux disease)    Hyperlipidemia    Melanoma ([place]) 02/02/2019   Left forearm. Superficial spreading. Tumor thickness 0.58mm, anatomic level III. Excised: 02/22/2019, margins free.   Multiple thyroid nodules    Rheumatic fever    Sleep apnea 1997   Uses C-Pap machine   SVT (supraventricular tachycardia) (HCC)    Thyroid nodule     PAST SURGICAL HISTORY:   Past Surgical History:  Procedure Laterality Date   APPENDECTOMY  1965   BICEPS TENDON REPAIR Right 05/2015   COLONOSCOPY  2016   LUMBAR FUSION  2015   L4/L5   RHINOPLASTY  1997   TONSILLECTOMY  1956   VASECTOMY  1981    SOCIAL HISTORY:   Social History   Tobacco Use   Smoking status: Never   Smokeless tobacco: Former    Types: Snuff  Substance Use Topics   Alcohol use: Yes    Alcohol/week: 1.0 - 2.0 standard drink    Types: 1 - 2 Cans of  beer per week    Comment: OCC    FAMILY HISTORY:   Family History  Problem Relation Age of Onset   Arthritis Mother    Lung cancer Mother    Hyperlipidemia Mother    Diabetes Mother    Arthritis Father    Diabetes Father    Lung disease Father    Hyperlipidemia Maternal Grandmother    Alcohol abuse Maternal Grandfather    Hyperlipidemia Maternal Grandfather    Drug abuse Cousin    Hyperlipidemia Other        maternal side of family     DRUG ALLERGIES:   Allergies  Allergen Reactions   Januvia [Sitagliptin]     Kidney pain    Lipitor [Atorvastatin]     10 mg ?myalgias   Statins Other (See Comments)    Myalgias; atorvastatin 10 mg, pravastatin 10 mg, rosuvastatin w/ unknown dose.     REVIEW OF  SYSTEMS:   ROS As per history of present illness. All pertinent systems were reviewed above. Constitutional, HEENT, cardiovascular, respiratory, GI, GU, musculoskeletal, neuro, psychiatric, endocrine, integumentary and hematologic systems were reviewed and are otherwise negative/unremarkable except for positive findings mentioned above in the HPI.   MEDICATIONS AT HOME:   Prior to Admission medications   Medication Sig Start Date End Date Taking? Authorizing Provider  azelastine (ASTELIN) 0.1 % nasal spray USE 1 SPRAY IN EACH NOSTRIL TWICE A DAY AS DIRECTED 12/10/20   [name], [name], MD  baclofen (LIORESAL) 10 MG tablet Take 5-10 mg by mouth at bedtime. Patient not taking: Reported on 03/31/2021 03/04/21   [provider]  cetirizine (ZYRTEC) 10 MG tablet Take 10 mg by mouth at bedtime.     [provider]  cholecalciferol (VITAMIN D3) 25 MCG (1000 UT) tablet Take 1,000 Units by mouth daily.    [provider]  Coenzyme Q10 (COQ10) 200 MG CAPS Take 200 mg by mouth daily.     [provider]  Continuous Blood Gluc Sensor (FREESTYLE LIBRE 2 SENSOR) MISC Use to check glucose Q8H 10/11/20   [name], [name], MD  ketoconazole (NIZORAL) 2 % shampoo  10/13/18   [provider]  metFORMIN (GLUCOPHAGE) 1000 MG tablet TAKE 1 TABLET TWICE A DAY WITH MEALS 03/04/21   [name], [name], MD  metoprolol succinate (TOPROL-XL) 50 MG 24 hr tablet TAKE 1 TABLET DAILY WITH OR IMMEDIATELY FOLLOWING A MEAL (DOSE CHANGE) 12/02/20   [name], [name], MD  Multiple Vitamins-Minerals (MULTIVITAMIN ADULT PO) Take 1 tablet by mouth daily.     [provider]  Omega-3 Fatty Acids (FISH OIL) 1200 MG CPDR Take 1 tablet by mouth daily.    [provider]  omeprazole (PRILOSEC) 40 MG capsule TAKE 1 CAPSULE DAILY 02/18/21   [name], [name], MD  OVER THE COUNTER MEDICATION Take 1 tablet by mouth daily. Osteo biflex    [provider]  predniSONE (DELTASONE) 10 MG tablet Take by mouth. Patient not taking: Reported on 03/31/2021 03/04/21   [provider]  Red Yeast Rice 600 MG TABS Take 600 mg by mouth 2 (two) times daily with a meal.    [provider]  tamsulosin (FLOMAX) 0.4 MG CAPS capsule Take 1 capsule (0.4 mg total) by mouth daily. 01/16/21   Billey Co, MD  triamcinolone ointment (KENALOG) 0.1 % Apply 1 application topically daily. As needed and cover with bandaid. Avoid face, groin, underarms. Patient not taking: Reported on 03/31/2021 01/28/21   Moye,  [place], MD  TRULICITY 2.53 GU/4.4IH SOPN INJECT 0.5 ML (0.75 MG TOTAL) UNDER THE SKIN ONCE A WEEK 12/02/20   [name], [name], MD  vitamin C (ASCORBIC ACID) 500 MG tablet Take 1,000 mg by mouth daily.     [provider]  vitamin E 100 UNIT capsule Take 400 Units by mouth daily.     [provider]      VITAL SIGNS:  Blood pressure 129/79, pulse (!) 59, temperature 98.6 F (37 C), temperature source Oral, resp. rate 20, height 6\' 1"  (1.854 m), weight 105.2 kg, SpO2 94 %.  PHYSICAL EXAMINATION:  Physical Exam  GENERAL:  70 y.o.-year-old Caucasian male patient lying in the bed with no acute distress.  EYES: Pupils equal, round, reactive to light and accommodation. No scleral icterus. Extraocular muscles intact.  HEENT: Head atraumatic, normocephalic. Oropharynx and nasopharynx clear.  NECK:  Supple, no jugular venous distention. No thyroid enlargement, no tenderness.  LUNGS: Normal breath sounds bilaterally, no wheezing, rales,rhonchi or crepitation. No use of accessory muscles of respiration.  CARDIOVASCULAR: Regular rate and rhythm, S1, S2 normal. No murmurs, rubs, or gallops.  ABDOMEN: Soft, nondistended, with epigastric and right upper quadrant tenderness without rebound tenderness guarding or rigidity.  He has positive Murphy sign.  Bowel sounds slightly diminished.  No organomegaly or mass.   EXTREMITIES: No pedal edema, cyanosis, or clubbing.  NEUROLOGIC: Cranial nerves II through XII are intact. Muscle strength 5/5 in all extremities. Sensation intact. Gait not checked.  PSYCHIATRIC: The patient is alert and oriented x 3.  Normal affect and good eye contact. SKIN: No obvious rash, lesion, or ulcer.   LABORATORY PANEL:   CBC Recent Labs  Lab 04/30/21 0038  WBC 10.8*  HGB 14.1  HCT 40.9  PLT 218   ------------------------------------------------------------------------------------------------------------------  Chemistries  Recent Labs  Lab 04/30/21 0038  NA 133*  K 4.7  CL 102  CO2 20*  GLUCOSE 163*  BUN 18  CREATININE 1.03  CALCIUM 8.3*  AST 111*  ALT 147*  ALKPHOS 121  BILITOT 3.9*   ------------------------------------------------------------------------------------------------------------------  Cardiac Enzymes No results for input(s): TROPONINI in the last 168 hours. ------------------------------------------------------------------------------------------------------------------  RADIOLOGY:  US Abdomen Limited RUQ (LIVER/GB)  Result Date: 04/30/2021 CLINICAL DATA:  Elevated liver function studies. Elevated bilirubin. Right upper quadrant pain for 4 days. EXAM: ULTRASOUND ABDOMEN LIMITED RIGHT UPPER QUADRANT COMPARISON:  01/27/2018 FINDINGS: Gallbladder: Cholelithiasis with sludge and small stones in the gallbladder. Largest stone measures about 4 mm. There is evidence of mild gallbladder wall thickening to 4 mm with pericholecystic edema. Murphy's sign is positive. Common bile duct: Diameter: 4 mm, normal Liver: Diffusely increased hepatic parenchymal echotexture consistent with fatty infiltration. No focal lesions identified. Portal vein is patent on color Doppler imaging with normal direction of blood flow towards the liver. Other: None. IMPRESSION: Cholelithiasis, gallbladder wall thickening, and edema with positive Murphy's sign. Findings are  compatible with acute cholecystitis. Electronically Signed   By: Lucienne Capers M.D.   On: 04/30/2021 02:38      IMPRESSION AND PLAN:  Active Problems:   Gallstone pancreatitis  1.  Acute calculus cholecystitis with associated acute pancreatitis and normal common bile duct however with jaundice and elevated transaminases. - The patient will be admitted to a progressive unit bed. - We will continue him on IV Zosyn. - Pain management will be provided. - He will be kept n.p.o. - General surgery consult to be obtained. - Notify Dr. Hampton Abbot about the patient. - Gastroenterology consult to  be obtained. - I notified Dr. Marius Ditch and Dr. Bonna Gains about the patient. - We will follow serial lipase levels as well as LFTs.  2.  Essential hypertension. - We will continue Toprol-XL.  3.  Type 2 diabetes mellitus. - The patient will be placed on supplement coverage with NovoLog and will hold off metformin.  4.  BPH. - We will continue Flomax.  DVT prophylaxis: Lovenox. Code Status: full code. Family Communication:  The plan of care was discussed in details with the patient (and his wife was with him in the room.). I answered all questions. The patient agreed to proceed with the above mentioned plan. Further management will depend upon hospital course. Disposition Plan: Back to previous home environment Consults called: General surgery and gastroenterology. All the records are reviewed and case discussed with ED provider.  Status is: Inpatient  Remains inpatient appropriate because:Ongoing active pain requiring inpatient pain management, Ongoing diagnostic testing needed not appropriate for outpatient work up, Unsafe d/c plan, IV treatments appropriate due to intensity of illness or inability to take PO, and Inpatient level of care appropriate due to severity of illness  Dispo: The patient is from: Home              Anticipated d/c is to: Home              Patient currently is not medically  stable to d/c.   Difficult to place patient No   TOTAL TIME TAKING CARE OF THIS PATIENT: 55 minutes.    Christel Mormon M.D on 04/30/2021 at 6:25 AM  [facility]   From 7 PM-7 AM, contact night-coverage [email or web address]  CC: Primary care physician; [name], [name], MD

## 2021-04-30 NOTE — Progress Notes (Signed)
Anticoagulation monitoring(Lovenox):  70 yo male ordered Lovenox 40 mg Q24h    Filed Weights   04/30/21 0024  Weight: 105.2 kg (232 lb)   BMI 30.61   Lab Results  Component Value Date   CREATININE 1.03 04/30/2021   CREATININE 1.18 03/11/2021   CREATININE 1.30 12/11/2020   Estimated Creatinine Clearance: 87.4 mL/min (by C-G formula based on SCr of 1.03 mg/dL). Hemoglobin & Hematocrit     Component Value Date/Time   HGB 14.1 04/30/2021 0038   HCT 40.9 04/30/2021 0038     Per Protocol for Patient with estCrcl  > 30 ml/min and BMI > 30, will transition to Lovenox 52.5 mg Q24H.

## 2021-04-30 NOTE — Anesthesia Preprocedure Evaluation (Signed)
Anesthesia Evaluation  Patient identified by MRN, date of birth, ID band Patient awake    Reviewed: Allergy & Precautions, H&P , NPO status , Patient's Chart, lab work & pertinent test results, reviewed documented beta blocker date and time   History of Anesthesia Complications (+) history of anesthetic complications  Airway Mallampati: III  TM Distance: >3 FB Neck ROM: full    Dental  (+) Teeth Intact   Pulmonary neg pulmonary ROS,    Pulmonary exam normal        Cardiovascular Exercise Tolerance: Good hypertension, On Medications + dysrhythmias Supra Ventricular Tachycardia  Rhythm:regular Rate:Normal     Neuro/Psych  Neuromuscular disease negative psych ROS   GI/Hepatic Neg liver ROS, GERD  Medicated,  Endo/Other  negative endocrine ROSdiabetes, Well Controlled, Type 2, Oral Hypoglycemic Agents  Renal/GU negative Renal ROS  negative genitourinary   Musculoskeletal   Abdominal   Peds  Hematology  (+) Blood dyscrasia, anemia ,   Anesthesia Other Findings Past Medical History: No date: Actinic keratosis No date: Allergy No date: Anemia No date: Arthritis No date: Basal cell carcinoma No date: Cancer ([place])     Comment:  BASAL CELL-FACE No date: Complication of anesthesia     Comment:  PT IS A RED HEAD AND STATES IT TAKES ALOT MORE               ANESTHESIA TO SEDATE HIM No date: Diabetes mellitus without complication (HCC) No date: Dysrhythmia     Comment:  SVT No date: Fatty liver No date: GERD (gastroesophageal reflux disease) No date: Hyperlipidemia 02/02/2019: Melanoma ([place])     Comment:  Left forearm. Superficial spreading. Tumor thickness               0.69mm, anatomic level III. Excised: 02/22/2019, margins               free. No date: Multiple thyroid nodules No date: Rheumatic fever 1997: Sleep apnea     Comment:  Uses C-Pap machine No date: SVT (supraventricular tachycardia) (HCC) No date:  Thyroid nodule Past Surgical History: 1965: APPENDECTOMY 05/2015: BICEPS TENDON REPAIR; Right 2016: COLONOSCOPY 2015: LUMBAR FUSION     Comment:  L4/L5 1997: RHINOPLASTY 1956: TONSILLECTOMY 1981: VASECTOMY BMI    Body Mass Index: 30.48 kg/m     Reproductive/Obstetrics negative OB ROS                             Anesthesia Physical Anesthesia Plan  ASA: 3  Anesthesia Plan: General ETT   Post-op Pain Management:    Induction:   PONV Risk Score and Plan: 3  Airway Management Planned:   Additional Equipment:   Intra-op Plan:   Post-operative Plan:   Informed Consent: I have reviewed the patients History and Physical, chart, labs and discussed the procedure including the risks, benefits and alternatives for the proposed anesthesia with the patient or authorized representative who has indicated his/her understanding and acceptance.     Dental Advisory Given  Plan Discussed with: CRNA  Anesthesia Plan Comments:         Anesthesia Quick Evaluation

## 2021-04-30 NOTE — Brief Op Note (Signed)
04/30/2021  9:13 PM  PATIENT:  [name]  70 y.o. male  PRE-OPERATIVE DIAGNOSIS:  Acute Cholecystitis  POST-OPERATIVE DIAGNOSIS:  Acute Cholecystitis  PROCEDURE:  Procedure(s): XI ROBOTIC ASSISTED LAPAROSCOPIC CHOLECYSTECTOMY (N/A)  SURGEON:  Surgeon(s) and Role:    * Zian Delair, MD - Primary  ANESTHESIA:   general  EBL:   100 ml  BLOOD ADMINISTERED:none  DRAINS: (19 Fr) Blake drain(s) in the RUQ    LOCAL MEDICATIONS USED:  BUPIVICAINE   SPECIMEN:  Source of Specimen:  gallbladder  DISPOSITION OF SPECIMEN:  PATHOLOGY  COUNTS:  YES  DICTATION: .Dragon Dictation  PLAN OF CARE: Admit to inpatient   PATIENT DISPOSITION:  PACU - hemodynamically stable.   Delay start of Pharmacological VTE agent (>24hrs) due to surgical blood loss or risk of bleeding: yes

## 2021-04-30 NOTE — Anesthesia Procedure Notes (Addendum)
Procedure Name: Intubation Date/Time: 04/30/2021 5:02 PM Performed by: Doreen Salvage, CRNA Pre-anesthesia Checklist: Patient identified, Patient being monitored, Timeout performed, Emergency Drugs available and Suction available Patient Re-evaluated:Patient Re-evaluated prior to induction Oxygen Delivery Method: Circle system utilized Preoxygenation: Pre-oxygenation with 100% oxygen Induction Type: IV induction Ventilation: Mask ventilation without difficulty Laryngoscope Size: Mac, 4 and McGraph Grade View: Grade I Tube type: Oral Tube size: 7.0 mm Number of attempts: 1 Airway Equipment and Method: Stylet Placement Confirmation: ETT inserted through vocal cords under direct vision, positive ETCO2 and breath sounds checked- equal and bilateral Secured at: 24 cm Tube secured with: Tape Dental Injury: Teeth and Oropharynx as per pre-operative assessment

## 2021-04-30 NOTE — Consult Note (Signed)
[facility] SURGICAL CONSULTATION NOTE (initial) - cpt: 99244   HISTORY OF PRESENT ILLNESS (HPI):  70 y.o. male presented to [facility] ED overnight for evaluation of abdominal pain. Patient reports that he initially had an episode of right sided abdominal pain on Saturday, but he thought this was related to working in the heat all day. This pain would spontaneously resolved. However, it returned last night after eating an ice cream bar. The pain is located in the RUQ and is sharp in nature. He endorses associated nausea,. Chills, and orange colored urine with the pain. No fever, CP, SOB, emesis, bowel changes, or jaundice. No history of similar prior to these episodes. Only previous surgery is appendectomy. He does endorse a known ventral hernia. Work up in the ED revealed a mild leukocytosis to 10.8K, renal function normal with sCr - 1.03, hyperbilirubinemia to 3.9, and Lipase to 257. He did have RUQ [place] which showed cholelithiasis and pericholecystic edema concerning for cholecystitis. He would undergo MRCP which was negative for choledocholithiasis nor pancreatitis. He was admitted to the medicine service and started on Zosyn.   Surgery is consulted by emergency medicine physician Dr. Pryor Curia, DO in this context for evaluation and management of acute cholecystitis and possible gallstone pancreatitis.   PAST MEDICAL HISTORY (PMH):  Past Medical History:  Diagnosis Date   Actinic keratosis    Allergy    Anemia    Arthritis    Basal cell carcinoma    Cancer ([place])    BASAL CELL-FACE   Complication of anesthesia    PT IS A RED HEAD AND STATES IT TAKES ALOT MORE ANESTHESIA TO SEDATE HIM   Diabetes mellitus without complication (HCC)    Dysrhythmia    SVT   Fatty liver    GERD (gastroesophageal reflux disease)    Hyperlipidemia    Melanoma ([place]) 02/02/2019   Left forearm. Superficial spreading. Tumor thickness 0.28mm, anatomic level III. Excised: 02/22/2019, margins free.    Multiple thyroid nodules    Rheumatic fever    Sleep apnea 1997   Uses C-Pap machine   SVT (supraventricular tachycardia) (HCC)    Thyroid nodule      PAST SURGICAL HISTORY ([place]):  Past Surgical History:  Procedure Laterality Date   APPENDECTOMY  1965   BICEPS TENDON REPAIR Right 05/2015   COLONOSCOPY  2016   LUMBAR FUSION  2015   L4/L5   RHINOPLASTY  1997   TONSILLECTOMY  1956   VASECTOMY  1981     MEDICATIONS:  Prior to Admission medications   Medication Sig Start Date End Date Taking? Authorizing Provider  azelastine (ASTELIN) 0.1 % nasal spray USE 1 SPRAY IN EACH NOSTRIL TWICE A DAY AS DIRECTED 12/10/20   [name], [name], MD  baclofen (LIORESAL) 10 MG tablet Take 5-10 mg by mouth at bedtime. Patient not taking: Reported on 03/31/2021 03/04/21   [provider]  cetirizine (ZYRTEC) 10 MG tablet Take 10 mg by mouth at bedtime.     [provider]  cholecalciferol (VITAMIN D3) 25 MCG (1000 UT) tablet Take 1,000 Units by mouth daily.    [provider]  Coenzyme Q10 (COQ10) 200 MG CAPS Take 200 mg by mouth daily.     [provider]  Continuous Blood Gluc Sensor (FREESTYLE LIBRE 2 SENSOR) MISC Use to check glucose Q8H 10/11/20   [name], [name], MD  ketoconazole (NIZORAL) 2 % shampoo  10/13/18   [provider]  metFORMIN (GLUCOPHAGE) 1000 MG tablet  TAKE 1 TABLET TWICE A DAY WITH MEALS 03/04/21   [name], [name], MD  metoprolol succinate (TOPROL-XL) 50 MG 24 hr tablet TAKE 1 TABLET DAILY WITH OR IMMEDIATELY FOLLOWING A MEAL (DOSE CHANGE) 12/02/20   [name], [name], MD  Multiple Vitamins-Minerals (MULTIVITAMIN ADULT PO) Take 1 tablet by mouth daily.     [provider]  Omega-3 Fatty Acids (FISH OIL) 1200 MG CPDR Take 1 tablet by mouth daily.    [provider]  omeprazole (PRILOSEC) 40 MG capsule TAKE 1 CAPSULE DAILY 02/18/21   [name], [name], MD  OVER THE COUNTER MEDICATION Take  1 tablet by mouth daily. Osteo biflex    [provider]  predniSONE (DELTASONE) 10 MG tablet Take by mouth. Patient not taking: Reported on 03/31/2021 03/04/21   [provider]  Red Yeast Rice 600 MG TABS Take 600 mg by mouth 2 (two) times daily with a meal.    [provider]  tamsulosin (FLOMAX) 0.4 MG CAPS capsule Take 1 capsule (0.4 mg total) by mouth daily. 01/16/21   Billey Co, MD  triamcinolone ointment (KENALOG) 0.1 % Apply 1 application topically daily. As needed and cover with bandaid. Avoid face, groin, underarms. Patient not taking: Reported on 03/31/2021 01/28/21   Moye, [place], MD  TRULICITY 3.66 YQ/0.3KV SOPN INJECT 0.5 ML (0.75 MG TOTAL) UNDER THE SKIN ONCE A WEEK 12/02/20   [name], [name], MD  vitamin C (ASCORBIC ACID) 500 MG tablet Take 1,000 mg by mouth daily.     [provider]  vitamin E 100 UNIT capsule Take 400 Units by mouth daily.     [provider]     ALLERGIES:  Allergies  Allergen Reactions   Januvia [Sitagliptin]     Kidney pain    Lipitor [Atorvastatin]     10 mg ?myalgias   Statins Other (See Comments)    Myalgias; atorvastatin 10 mg, pravastatin 10 mg, rosuvastatin w/ unknown dose.      SOCIAL HISTORY:  Social History   Socioeconomic History   Marital status: Married    Spouse name: Not on file   Number of children: Not on file   Years of education: Not on file   Highest education level: Not on file  Occupational History   Not on file  Tobacco Use   Smoking status: Never   Smokeless tobacco: Former    Types: Snuff  Vaping Use   Vaping Use: Never used  Substance and Sexual Activity   Alcohol use: Yes    Alcohol/week: 1.0 - 2.0 standard drink    Types: 1 - 2 Cans of beer per week    Comment: OCC   Drug use: No   Sexual activity: Not Currently  Other Topics Concern   Not on file  Social History Narrative   Married    Former Nature conservation officer    2 daughters   Social Determinants of  Radio broadcast assistant Strain: Low Risk    Difficulty of Paying Living Expenses: Not hard at all  Food Insecurity: No Food Insecurity   Worried About Charity fundraiser in the Last Year: Never true   Arboriculturist in the Last Year: Never true  Transportation Needs: No Transportation Needs   Lack of Transportation (Medical): No   Lack of Transportation (Non-Medical): No  Physical Activity: Not on file  Stress: No Stress Concern Present   Feeling of Stress : Not at all  Social Connections: Unknown  Frequency of Communication with Friends and Family: Not on file   Frequency of Social Gatherings with Friends and Family: Not on file   Attends Religious Services: Not on file   Active Member of Clubs or Organizations: Not on file   Attends Archivist Meetings: Not on file   Marital Status: Married  Human resources officer Violence: Not At Risk   Fear of Current or Ex-Partner: No   Emotionally Abused: No   Physically Abused: No   Sexually Abused: No     FAMILY HISTORY:  Family History  Problem Relation Age of Onset   Arthritis Mother    Lung cancer Mother    Hyperlipidemia Mother    Diabetes Mother    Arthritis Father    Diabetes Father    Lung disease Father    Hyperlipidemia Maternal Grandmother    Alcohol abuse Maternal Grandfather    Hyperlipidemia Maternal Grandfather    Drug abuse Cousin    Hyperlipidemia Other        maternal side of family       REVIEW OF SYSTEMS:  Review of Systems  Constitutional:  Positive for chills. Negative for fever.  Respiratory:  Negative for cough and shortness of breath.   Cardiovascular:  Negative for chest pain and palpitations.  Gastrointestinal:  Positive for abdominal pain and nausea. Negative for diarrhea and vomiting.  Genitourinary:  Negative for dysuria and urgency.  Skin:  Negative for itching and rash.  All other systems reviewed and are negative.  VITAL SIGNS:  Temp:  [97.9 F (36.6 C)-98.6 F (37 C)] 97.9  F (36.6 C) (07/06 0639) Pulse Rate:  [56-61] 61 (07/06 0639) Resp:  [16-20] 16 (07/06 0639) BP: (128-144)/(79-87) 129/79 (07/06 0530) SpO2:  [94 %-97 %] 97 % (07/06 0639) Weight:  [105.2 kg] 105.2 kg (07/06 0024)     Height: 6\' 1"  (185.4 cm) Weight: 105.2 kg BMI (Calculated): 30.62   INTAKE/OUTPUT:  07/05 0701 - 07/06 0700 In: 325 [I.V.:275; IV Piggyback:50] Out: -   PHYSICAL EXAM:  Physical Exam Vitals and nursing note reviewed. Exam conducted with a chaperone present.  Constitutional:      General: He is not in acute distress.    Appearance: He is well-developed. He is obese. He is not ill-appearing.  HENT:     Head: Normocephalic and atraumatic.  Eyes:     General: No scleral icterus.    Extraocular Movements: Extraocular movements intact.     Pupils: Pupils are equal, round, and reactive to light.  Cardiovascular:     Rate and Rhythm: Normal rate and regular rhythm.     Heart sounds: Normal heart sounds. No murmur heard. Pulmonary:     Effort: Pulmonary effort is normal. No respiratory distress.  Abdominal:     General: Abdomen is protuberant. A surgical scar is present. There is no distension.     Palpations: Abdomen is soft.     Tenderness: There is abdominal tenderness in the right upper quadrant. There is no guarding or rebound. Positive signs include Murphy's sign.     Hernia: A hernia is present. Hernia is present in the umbilical area.     Comments: Abdomen is soft, RUQ tenderness with positive Murphy's Sign, non-distended, no rebound/guarding. He does have an umbilical hernia.   Genitourinary:    Comments: Deferred Skin:    General: Skin is warm and dry.     Coloration: Skin is not jaundiced or pale.  Neurological:     General: No focal  deficit present.     Mental Status: He is alert and oriented to person, place, and time.  Psychiatric:        Mood and Affect: Mood normal.        Behavior: Behavior normal.     Labs:  CBC Latest Ref Rng & Units 04/30/2021  03/11/2021 12/11/2020  WBC 4.0 - 10.5 K/uL 10.8(H) 12.4(H) 9.7  Hemoglobin 13.0 - 17.0 g/dL 14.1 15.1 14.8  Hematocrit 39.0 - 52.0 % 40.9 44.8 43.9  Platelets 150 - 400 K/uL 218 246.0 207.0   CMP Latest Ref Rng & Units 04/30/2021 03/11/2021 12/11/2020  Glucose 70 - 99 mg/dL 163(H) 155(H) 95  BUN 8 - 23 mg/dL 18 22 15   Creatinine 0.61 - 1.24 mg/dL 1.03 1.18 1.30  Sodium 135 - 145 mmol/L 133(L) 137 139  Potassium 3.5 - 5.1 mmol/L 4.7 4.2 4.4  Chloride 98 - 111 mmol/L 102 100 101  CO2 22 - 32 mmol/L 20(L) 29 30  Calcium 8.9 - 10.3 mg/dL 8.3(L) 9.5 9.3  Total Protein 6.5 - 8.1 g/dL 6.8 6.6 6.6  Total Bilirubin 0.3 - 1.2 mg/dL 3.9(H) 0.4 0.4  Alkaline Phos 38 - 126 U/L 121 46 41  AST 15 - 41 U/L 111(H) 16 22  ALT 0 - 44 U/L 147(H) 31 34     Imaging studies:   RUQ [place] (04/30/2021) personally reviewed with cholelithiasis and gallbladder changes concerning for cholecystitis, and radiologist report reviewed:  IMPRESSION: Cholelithiasis, gallbladder wall thickening, and edema with positive Murphy's sign. Findings are compatible with acute cholecystitis.    MRCP (04/30/2021) personally reviewed with acute cholecystitis, no choledocholithiasis, no pancreatitis, and radiologist report reviewed below:  IMPRESSION: 1. Study is positive for cholelithiasis with imaging findings suggestive of acute cholecystitis. No evidence of choledocholithiasis. No findings of biliary tract obstruction. 2. Subcentimeter cystic lesions in the pancreatic head and body. These are statistically likely to represent focal areas of side branch ectasia or small pancreatic pseudocysts, however, the possibility of side branch IPMN (intraductal papillary mucinous neoplasm is not excluded). Recommend follow up pre and post contrast MRI/MRCP or pancreatic protocol CT in 2 years. This recommendation follows ACR consensus guidelines: Management of Incidental Pancreatic Cysts: A White Paper of the ACR Incidental  Findings Committee. J Am Coll Radiol 7989;21:194-174. 3. Multiple renal lesions bilaterally, as above, incompletely characterized on today's noncontrast examination but likely to represent Bosniak class 1 and Bosniak class 2 cysts. Attention at time of repeat abdominal MRI is recommended to ensure stability. 4. Hepatic steatosis.   Assessment/Plan: (ICD-10's: K81.0) 70 y.o. male with RUQ pain, mile leukocytosis, and hyperbilirubinemia found to have acute cholecystitis and possible gallstone pancreatitis; however, I suspect he may have passed stone based on laboratory and MRCP results.    - Appreciate medicine admission  - We will tentatively plan for robotic assisted laparoscopic cholecystectomy this afternoon with Dr Hampton Abbot pending OR/Anesthesia availability  - All risks, benefits, and alternatives to above procedure(s) were discussed with the patient, all of his questions were answered to his expressed satisfaction, patient expresses he wishes to proceed, and informed consent was obtained.   - NPO + IVF Resuscitation - IV Abx (Zosyn) - Monitor abdominal examination - Monitor leukocytosis - Monitor lipase level; hyperbilirubinemia    - DVT prophylaxis; hold for potential OR  All of the above findings and recommendations were discussed with the patient (and his wife at bedside), and all of patient's questions were answered to his expressed satisfaction.  Thank you for the  opportunity to participate in this patient's care.   -- Edison Simon, PA-C [facility] 04/30/2021, 7:29 AM [phone] M-F: 7am - 4pm

## 2021-04-30 NOTE — ED Triage Notes (Signed)
Right sided abdominal pain since Saturday. Blood in urine yesterday. No n/v/d or fevers. No pain with urination.

## 2021-04-30 NOTE — Progress Notes (Signed)

## 2021-04-30 NOTE — ED Provider Notes (Addendum)
[facility] Emergency Department Provider Note  ____________________________________________   Event Date/Time   First MD Initiated Contact with Patient 04/30/21 0250     (approximate)  I have reviewed the triage vital signs and the nursing notes.   HISTORY  Chief Complaint Abdominal Pain    HPI Jackson [name] is a 70 y.o. male with history of diabetes, hyperlipidemia who presents to the emergency department intermittent upper abdominal pain since Saturday, July 2.  Has had nausea without vomiting.  Some loose stools.  No bloody stools or melena.  No chest pain or shortness of breath.  Pain waxes and wanes and at times is sharp, severe in nature.  No known aggravating or alleviating factors.  No fever.        Past Medical History:  Diagnosis Date   Actinic keratosis    Allergy    Anemia    Arthritis    Basal cell carcinoma    Cancer (HCC)    BASAL CELL-FACE   Complication of anesthesia    PT IS A RED HEAD AND STATES IT TAKES ALOT MORE ANESTHESIA TO SEDATE HIM   Diabetes mellitus without complication (HCC)    Dysrhythmia    SVT   Fatty liver    GERD (gastroesophageal reflux disease)    Hyperlipidemia    Melanoma ([place]) 02/02/2019   Left forearm. Superficial spreading. Tumor thickness 0.49mm, anatomic level III. Excised: 02/22/2019, margins free.   Multiple thyroid nodules    Rheumatic fever    Sleep apnea 1997   Uses C-Pap machine   SVT (supraventricular tachycardia) (HCC)    Thyroid nodule     Patient Active Problem List   Diagnosis Date Noted   Gallstone pancreatitis 04/30/2021   Abnormal MRI, lumbar spine 02/17/2021   Iron deficiency 12/30/2020   Statin intolerance 08/23/2020   Hypertension associated with diabetes ([place]) 07/24/2020   Statin myopathy 03/20/2020   Type 2 diabetes mellitus with hyperglycemia, without long-term current use of insulin ([place]) 12/19/2019   Elevated PSA 06/13/2019   Iron deficiency anemia 06/13/2019    Basal cell carcinoma of skin 03/02/2019   Melanoma of skin ([place]) 02/21/2019   AK (actinic keratosis) 11/23/2018   Vertigo 11/23/2018   Allergic rhinitis 07/04/2018   Fatty liver 03/03/2018   Bradycardia 03/03/2018   Fatigue 01/23/2018   Benign prostatic hyperplasia without lower urinary tract symptoms 01/23/2018   Type 2 diabetes mellitus without complication, without long-term current use of insulin ([place]) 01/23/2018   Thyroid nodule 01/20/2018   Hyperlipidemia 11/26/2016   DM type 2 with diabetic peripheral neuropathy ([place]) 05/27/2016   Paroxysmal SVT (supraventricular tachycardia) ([place]) 05/27/2016   GERD (gastroesophageal reflux disease) 05/27/2016   OSA (obstructive sleep apnea) 05/27/2016    Past Surgical History:  Procedure Laterality Date   [place] Right 05/2015   COLONOSCOPY  2016   LUMBAR FUSION  2015   L4/L5   [place]    Prior to Admission medications   Medication Sig Start Date End Date Taking? Authorizing Provider  azelastine (ASTELIN) 0.1 % nasal spray USE 1 SPRAY IN EACH NOSTRIL TWICE A DAY AS DIRECTED 12/10/20   [name], [name], MD  baclofen (LIORESAL) 10 MG tablet Take 5-10 mg by mouth at bedtime. Patient not taking: Reported on 03/31/2021 03/04/21   [provider]  cetirizine (ZYRTEC) 10 MG tablet Take 10 mg by mouth at bedtime.  [provider]  cholecalciferol (VITAMIN D3) 25 MCG (1000 UT) tablet Take 1,000 Units by mouth daily.    [provider]  Coenzyme Q10 (COQ10) 200 MG CAPS Take 200 mg by mouth daily.     [provider]  Continuous Blood Gluc Sensor (FREESTYLE LIBRE 2 SENSOR) MISC Use to check glucose Q8H 10/11/20   [name], [name], MD  ketoconazole (NIZORAL) 2 % shampoo  10/13/18   [provider]  metFORMIN (GLUCOPHAGE) 1000 MG tablet TAKE 1 TABLET TWICE A DAY WITH MEALS 03/04/21   [name], [name], MD  metoprolol succinate (TOPROL-XL) 50 MG 24 hr tablet TAKE 1 TABLET DAILY WITH OR IMMEDIATELY FOLLOWING A MEAL (DOSE CHANGE) 12/02/20   [name], [name], MD  Multiple Vitamins-Minerals (MULTIVITAMIN ADULT PO) Take 1 tablet by mouth daily.     [provider]  Omega-3 Fatty Acids (FISH OIL) 1200 MG CPDR Take 1 tablet by mouth daily.    [provider]  omeprazole (PRILOSEC) 40 MG capsule TAKE 1 CAPSULE DAILY 02/18/21   [name], [name], MD  OVER THE COUNTER MEDICATION Take 1 tablet by mouth daily. Osteo biflex    [provider]  predniSONE (DELTASONE) 10 MG tablet Take by mouth. Patient not taking: Reported on 03/31/2021 03/04/21   [provider]  Red Yeast Rice 600 MG TABS Take 600 mg by mouth 2 (two) times daily with a meal.    [provider]  tamsulosin (FLOMAX) 0.4 MG CAPS capsule Take 1 capsule (0.4 mg total) by mouth daily. 01/16/21   Billey Co, MD  triamcinolone ointment (KENALOG) 0.1 % Apply 1 application topically daily. As needed and cover with bandaid. Avoid face, groin, underarms. Patient not taking: Reported on 03/31/2021 01/28/21   Moye, [place], MD  TRULICITY 3.32 RJ/1.8AC SOPN INJECT 0.5 ML (0.75 MG TOTAL) UNDER THE SKIN ONCE A WEEK 12/02/20   [name], [name], MD  vitamin C (ASCORBIC ACID) 500 MG tablet Take 1,000 mg by mouth daily.     [provider]  vitamin E 100 UNIT capsule Take 400 Units by mouth daily.     [provider]    Allergies Januvia [sitagliptin], Lipitor [atorvastatin], and Statins  Family History  Problem Relation Age of Onset   Arthritis Mother    Lung cancer Mother    Hyperlipidemia Mother    Diabetes Mother    Arthritis Father    Diabetes Father    Lung disease Father    Hyperlipidemia Maternal Grandmother    Alcohol abuse Maternal Grandfather    Hyperlipidemia Maternal Grandfather    Drug abuse Cousin    Hyperlipidemia Other        maternal side of family      Social History Social History   Tobacco Use   Smoking status: Never   Smokeless tobacco: Former    Types: Snuff  Vaping Use   Vaping Use: Never used  Substance Use Topics   Alcohol use: Yes    Alcohol/week: 1.0 - 2.0 standard drink    Types: 1 - 2 Cans of beer per week    Comment: OCC   Drug use: No    Review of Systems Constitutional: No fever. Eyes: No visual changes. ENT: No sore throat. Cardiovascular: Denies chest pain. Respiratory: Denies shortness of breath. Gastrointestinal: No vomiting, diarrhea. Genitourinary: Negative for dysuria. Musculoskeletal: Negative for back pain. Skin: Negative for rash. Neurological: Negative for focal weakness or numbness.  ____________________________________________   PHYSICAL EXAM:  VITAL SIGNS: ED Triage Vitals  Enc Vitals Group     BP 04/30/21 0024 128/80     Pulse Rate 04/30/21 0024 (!) 56     Resp 04/30/21 0024 20     Temp 04/30/21 0024 98.6 F (37 C)     Temp Source 04/30/21 0024 Oral     SpO2 04/30/21 0024 96 %     Weight 04/30/21 0024 232 lb (105.2 kg)     Height 04/30/21 0024 6\' 1"  (1.854 m)     Head Circumference --      Peak Flow --      Pain Score 04/30/21 0036 7     Pain Loc --      Pain Edu? --      Excl. in [place]? --    CONSTITUTIONAL: Alert and oriented and responds appropriately to questions. Well-appearing; well-nourished HEAD: Normocephalic EYES: Conjunctivae clear, pupils appear equal, EOM appear intact ENT: normal nose; moist mucous membranes NECK: Supple, normal ROM CARD: RRR; S1 and S2 appreciated; no murmurs, no clicks, no rubs, no gallops RESP: Normal chest excursion without splinting or tachypnea; breath sounds clear and equal bilaterally; no wheezes, no rhonchi, no rales, no hypoxia or respiratory distress, speaking full sentences ABD/GI: Normal bowel sounds; non-distended; soft, non-tender, no rebound, no guarding, no peritoneal signs, no hepatosplenomegaly BACK: The back appears  normal EXT: Normal ROM in all joints; no deformity noted, no edema; no cyanosis SKIN: Normal color for age and race; warm; no rash on exposed skin NEURO: Moves all extremities equally PSYCH: The patient's mood and manner are appropriate.  ____________________________________________   LABS (all labs ordered are listed, but only abnormal results are displayed)  Labs Reviewed  COMPREHENSIVE METABOLIC PANEL - Abnormal; Notable for the following components:      Result Value   Sodium 133 (*)    CO2 20 (*)    Glucose, Bld 163 (*)    Calcium 8.3 (*)    AST 111 (*)    ALT 147 (*)    Total Bilirubin 3.9 (*)    All other components within normal limits  CBC - Abnormal; Notable for the following components:   WBC 10.8 (*)    All other components within normal limits  URINALYSIS, COMPLETE (UACMP) WITH MICROSCOPIC - Abnormal; Notable for the following components:   Color, Urine YELLOW (*)    APPearance CLEAR (*)    All other components within normal limits  RESP PANEL BY RT-PCR (FLU A&B, COVID) ARPGX2  PROTIME-INR  LIPASE, BLOOD  TYPE AND SCREEN   ____________________________________________  EKG   EKG Interpretation  Date/Time:  Wednesday April 30 2021 00:31:59 EDT Ventricular Rate:  54 PR Interval:  164 QRS Duration: 88 QT Interval:  436 QTC Calculation: 413 R Axis:   34 Text Interpretation: Sinus bradycardia with sinus arrhythmia Nonspecific T wave abnormality Abnormal ECG Confirmed by Pryor Curia [phone]) on 04/30/2021 3:01:34 AM         ____________________________________________  RADIOLOGY Jessie Foot Jobanny Mavis, personally viewed and evaluated these images (plain radiographs) as part of my medical decision making, as well as reviewing the written report by the radiologist.  ED MD interpretation: Cholecystitis  Official radiology report(s): US Abdomen Limited RUQ (LIVER/GB)  Result Date: 04/30/2021 CLINICAL DATA:  Elevated liver function studies. Elevated bilirubin.  Right upper quadrant pain for 4 days. EXAM: ULTRASOUND ABDOMEN LIMITED RIGHT UPPER QUADRANT COMPARISON:  01/27/2018 FINDINGS: Gallbladder: Cholelithiasis with sludge and small stones in the gallbladder. Largest stone measures about 4  mm. There is evidence of mild gallbladder wall thickening to 4 mm with pericholecystic edema. Murphy's sign is positive. Common bile duct: Diameter: 4 mm, normal Liver: Diffusely increased hepatic parenchymal echotexture consistent with fatty infiltration. No focal lesions identified. Portal vein is patent on color Doppler imaging with normal direction of blood flow towards the liver. Other: None. IMPRESSION: Cholelithiasis, gallbladder wall thickening, and edema with positive Murphy's sign. Findings are compatible with acute cholecystitis. Electronically Signed   By: Lucienne Capers M.D.   On: 04/30/2021 02:38    ____________________________________________   PROCEDURES  Procedure(s) performed (including Critical Care):  Procedures   ____________________________________________   INITIAL IMPRESSION / ASSESSMENT AND PLAN / ED COURSE  As part of my medical decision making, I reviewed the following data within the [place] notes reviewed and incorporated, Labs reviewed , EKG interpreted , Old chart reviewed, Radiograph reviewed , Discussed with admitting physician , A consult was requested and obtained from this/these consultant(s) Surgery, and Notes from prior ED visits         Patient here with right upper quadrant abdominal pain.  Labs obtained in triage reveal slightly elevated AST, ALT and elevated total bilirubin.  Patient also has significantly elevated lipase.  Lab reports unable to result his lipase given it is so elevated and needing multiple rounds of dilution that they have run out of blood.  We will send another sample.  He does have a slight leukocytosis.  Ultrasound shows cholecystitis but normal common bile duct and no  signs of choledocholithiasis.  ED PROGRESS  3:05 AM  Discussed with Dr. Hampton Abbot with general surgery.  He agrees with giving IV antibiotics.  Given elevated liver function test and gallstone pancreatitis recommends MRCP.  States this will also evaluate the pancreas appropriately.  He has reviewed gastroenterology schedule and states that it appears that Dr. Allen Norris is in the hospital this week and that the patient would not need to be transferred to [facility] if he does have choledocholithiasis.  He recommends medicine admission.  We will keep n.p.o. and give IV fluids.  Will give pain and nausea medicine.  4:00 AM  Discussed patient's case with hospitalist, Dr. Sidney Ace.  I have recommended admission and patient (and family if present) agree with this plan. Admitting physician will place admission orders.   I reviewed all nursing notes, vitals, pertinent previous records and reviewed/interpreted all EKGs, lab and urine results, imaging (as available).   7:30 AM  Pt's lipase is elevated at 257.  MRI shows cholecystitis, cholelithiasis but no choledocholithiasis.  Appears to have multiple small pancreatic pseudocysts versus possible neoplasm versus ectasia.  Admitted to hospitalist service with surgical consultation. ____________________________________________   FINAL CLINICAL IMPRESSION(S) / ED DIAGNOSES  Final diagnoses:  RUQ abdominal pain  Cholecystitis  Gallstone pancreatitis     ED Discharge Orders     None       *Please note:  Jackson [name] was evaluated in Emergency Department on 04/30/2021 for the symptoms described in the history of present illness. He was evaluated in the context of the global COVID-19 pandemic, which necessitated consideration that the patient might be at risk for infection with the SARS-CoV-2 virus that causes COVID-19. Institutional protocols and algorithms that pertain to the evaluation of patients at risk for COVID-19 are in a state of rapid change based on  information released by regulatory bodies including the CDC and federal and state organizations. These policies and algorithms were followed during the patient's  care in the ED.  Some ED evaluations and interventions may be delayed as a result of limited staffing during and the pandemic.*   Note:  This document was prepared using Dragon voice recognition software and may include unintentional dictation errors.    Mussa Groesbeck, Delice Bison, DO 04/30/21 [place], Delice Bison, DO 04/30/21 [phone]

## 2021-05-01 DIAGNOSIS — E871 Hypo-osmolality and hyponatremia: Secondary | ICD-10-CM

## 2021-05-01 LAB — COMPREHENSIVE METABOLIC PANEL
ALT: 212 U/L — ABNORMAL HIGH (ref 0–44)
AST: 149 U/L — ABNORMAL HIGH (ref 15–41)
Albumin: 3 g/dL — ABNORMAL LOW (ref 3.5–5.0)
Alkaline Phosphatase: 121 U/L (ref 38–126)
Anion gap: 8 (ref 5–15)
BUN: 21 mg/dL (ref 8–23)
CO2: 24 mmol/L (ref 22–32)
Calcium: 7.8 mg/dL — ABNORMAL LOW (ref 8.9–10.3)
Chloride: 105 mmol/L (ref 98–111)
Creatinine, Ser: 1.14 mg/dL (ref 0.61–1.24)
GFR, Estimated: >60 mL/min (ref >60)
Glucose, Bld: 144 mg/dL — ABNORMAL HIGH (ref 70–99)
Potassium: 4.3 mmol/L (ref 3.5–5.1)
Sodium: 137 mmol/L (ref 135–145)
Total Bilirubin: 1.4 mg/dL — ABNORMAL HIGH (ref 0.3–1.2)
Total Protein: 6.2 g/dL — ABNORMAL LOW (ref 6.5–8.1)

## 2021-05-01 LAB — CBC
HCT: 38.4 % — ABNORMAL LOW (ref 39.0–52.0)
Hemoglobin: 13.2 g/dL (ref 13.0–17.0)
MCH: 30.8 pg (ref 26.0–34.0)
MCHC: 34.4 g/dL (ref 30.0–36.0)
MCV: 89.7 fL (ref 80.0–100.0)
Platelets: 245 10*3/uL (ref 150–400)
RBC: 4.28 MIL/uL (ref 4.22–5.81)
RDW: 14.1 % (ref 11.5–15.5)
WBC: 10 10*3/uL (ref 4.0–10.5)
nRBC: 0 % (ref 0.0–0.2)

## 2021-05-01 LAB — GLUCOSE, CAPILLARY: Glucose-Capillary: 147 mg/dL — ABNORMAL HIGH (ref 70–99)

## 2021-05-01 LAB — MAGNESIUM: Magnesium: 2.3 mg/dL (ref 1.7–2.4)

## 2021-05-01 LAB — LIPASE, BLOOD: Lipase: 66 U/L — ABNORMAL HIGH (ref 11–51)

## 2021-05-01 MED ORDER — INSULIN ASPART 100 UNIT/ML IJ SOLN
0.0000 [IU] | Freq: Three times a day (TID) | INTRAMUSCULAR | Status: DC
  Filled 2021-05-01: qty 1

## 2021-05-01 MED ORDER — ENOXAPARIN SODIUM 40 MG/0.4ML IJ SOSY
40.0000 mg | PREFILLED_SYRINGE | INTRAMUSCULAR | Status: DC
  Administered 2021-05-01: 40 mg via SUBCUTANEOUS
  Filled 2021-05-01: qty 0.4

## 2021-05-01 MED ORDER — SODIUM CHLORIDE 0.9 % IV SOLN: INTRAVENOUS | Status: DC | PRN

## 2021-05-01 MED ORDER — AZELASTINE HCL 0.1 % NA SOLN
1.0000 | Freq: Two times a day (BID) | NASAL | Status: DC | PRN
  Filled 2021-05-01: qty 30

## 2021-05-01 MED ORDER — INSULIN ASPART 100 UNIT/ML IJ SOLN: 0.0000 [IU] | Freq: Every day | INTRAMUSCULAR | Status: DC

## 2021-05-01 NOTE — Progress Notes (Signed)
Jackson Hospital Day(s): 1.   Post op day(s): 1 Day Post-Op.   Interval History:  [name] seen and examined No acute events or new complaints overnight.  [name] reports he is feeling better; abdominal soreness but overall improved No fever, chills, nausea, emesis Previous mild leukocytosis now resolved; WBC 10.0K this morning Renal function remains normal at his baseline, sCr - 1.14; UO - 200 ccs + unmeasured No significant electrolyte derangements Lipase down to 66 and hyperbilirubinemia improved to 1.4 Surgical drain with 10 ccs out overnight; serous He has been on CLD post-operatively; tolerating breakfast Continues on Zosyn  Vital signs in last 24 hours: [min-max] current  Temp:  [97.7 F (36.5 C)-98.5 F (36.9 C)] 98 F (36.7 C) (07/07 0500) Pulse Rate:  [60-84] 61 (07/07 0500) Resp:  [16-28] 19 (07/07 0500) BP: (116-143)/(67-84) 116/71 (07/07 0500) SpO2:  [90 %-99 %] 96 % (07/07 0500) Weight:  [104.8 kg] 104.8 kg (07/06 1536)     Height: 6\' 1"  (185.4 cm) Weight: 104.8 kg BMI (Calculated): 30.48   Intake/Output last 2 shifts:  07/06 0701 - 07/07 0700 In: 2030.7 [P.O.:60; I.V.:1945; IV Piggyback:25.7] Out: 310 [Urine:200; Drains:10; Blood:100]   Physical Exam:  Constitutional: alert, cooperative and no distress  Respiratory: breathing non-labored at rest  Cardiovascular: regular rate and sinus rhythm  Gastrointestinal: soft, non-tender, and non-distended, no rebound/guarding, surgical drain in RLQ with serous output  Integumentary: Laparoscopic incisions are CDI with dermabond, no erythema or drainage   Labs:  CBC Latest Ref Rng & Units 05/01/2021 04/30/2021 03/11/2021  WBC 4.0 - 10.5 K/uL 10.0 10.8(H) 12.4(H)  Hemoglobin 13.0 - 17.0 g/dL 13.2 14.1 15.1  Hematocrit 39.0 - 52.0 % 38.4(L) 40.9 44.8  Platelets 150 - 400 K/uL 245 218 246.0   CMP Latest Ref Rng & Units 05/01/2021 04/30/2021 04/30/2021  Glucose 70 - 99 mg/dL 144(H) -  163(H)  BUN 8 - 23 mg/dL 21 - 18  Creatinine 0.61 - 1.24 mg/dL 1.14 - 1.03  Sodium 135 - 145 mmol/L 137 - 133(L)  Potassium 3.5 - 5.1 mmol/L 4.3 - 4.7  Chloride 98 - 111 mmol/L 105 - 102  CO2 22 - 32 mmol/L 24 - 20(L)  Calcium 8.9 - 10.3 mg/dL 7.8(L) - 8.3(L)  Total Protein 6.5 - 8.1 g/dL 6.2(L) 6.6 6.8  Total Bilirubin 0.3 - 1.2 mg/dL 1.4(H) 1.7(H) 3.9(H)  Alkaline Phos 38 - 126 U/L 121 121 121  AST 15 - 41 U/L 149(H) 77(H) 111(H)  ALT 0 - 44 U/L 212(H) 136(H) 147(H)    Imaging studies: No new pertinent imaging studies   Assessment/Plan: 70 y.o. male  1 Day Post-Op s/p robotic assisted laparoscopic cholecystectomy for acute cholecystitis and gallstone pancreatitis, likely passed stone   - Okay to ADAT as tolerated for lunch/dinner - Stop IVF once diet advanced  - Continue IV Abx (Zosyn); transition to PO for home  - Monitor abdominal examination - Monitor leukocytosis --> improved - Monitor lipase level; hyperbilirubinemia  --> improved  - Mobilization as tolerated  - Further Management per primary service; we will follow    - Discharge Planning: Stay today for 24 hours additional IV Abx; anticipate discharge home in the AM with drain and PO ABx  All of the above findings and recommendations were discussed with the [name], Jackson [name]), and the medical team, and all of Jackson and family's questions were answered to their expressed satisfaction.  -- Edison Simon, PA-C [facility]  05/01/2021, 7:20 AM [phone] M-F: 7am - 4pm

## 2021-05-01 NOTE — Progress Notes (Signed)
Order given from Dr Hampton Abbot to increase diet to carb modified and stop IVF

## 2021-05-01 NOTE — Progress Notes (Signed)
PROGRESS NOTE    Jackson [name]  ZOX:[id number] DOB: November 21, 1950 DOA: 04/30/2021 PCP: [name], [name], MD  [id number]   Assessment & Plan:   Active Problems:   Gallstone pancreatitis   Cholecystitis   Jackson [name] is a 70 y.o. Caucasian male with medical history significant for type 2 diabetes mellitus, dyslipidemia, hypertension, GERD, obstructive sleep apnea on home CPAP, rheumatic fever and SVT, who presented emergency room with right upper quadrant abdominal pain which started on Saturday with associated nausea without vomiting.     1.  Acute calculus cholecystitis  with jaundice and elevated transaminases. S/p cholecystectomy on 04/30/21 -MRCP neg for choledocholithiasis --started on IV zosyn --cholecystectomy with Dr. Hampton Abbot with abdominal drain placed due to significant inflammation and infected gallbladder.  Plan: --cont IV zosyn  acute gallstone pancreatitis  --lipase 257 on presentation, already trended down with IVF --no need for further IVF  2.  Essential hypertension. --cont Toprol  3.  Type 2 diabetes mellitus. - recent A1c 6.8.  On metformin and Trulicity at home. --SSI while inpatient  4.  BPH. - We will continue Flomax.  GERD --cont PPI   DVT prophylaxis: Lovenox SQ Code Status: Full code  Family Communication:  Level of care: Med-Surg Dispo:   The patient is from: home Anticipated d/c is to: home Anticipated d/c date is: 1-2 days Patient currently is not medically ready to d/c due to: need to be on IV zosyn for now   Subjective and Interval History:  Abdominal pain controlled.  Tolerated solid foods.     Objective: Vitals:   05/01/21 0429 05/01/21 0500 05/01/21 0817 05/01/21 1520  BP: 116/71 116/71 130/71 124/64  Pulse: 61 61 61 72  Resp: 18 19 20 20   Temp:  98 F (36.7 C) 98.9 F (37.2 C) 99.3 F (37.4 C)  TempSrc:  Oral Oral Oral  SpO2: 96% 96% 96% 95%  Weight:      Height:        Intake/Output Summary (Last 24  hours) at 05/01/2021 1813 Last data filed at 05/01/2021 1657 Gross per 24 hour  Intake 2274.85 ml  Output 1140 ml  Net 1134.85 ml   Filed Weights   04/30/21 0024 04/30/21 1536  Weight: 105.2 kg 104.8 kg    Examination:   Constitutional: NAD, AAOx3 HEENT: conjunctivae and lids normal, EOMI CV: No cyanosis.   RESP: normal respiratory effort, on RA GI: abdominal drain outputting SS fluids Extremities: No effusions, edema in BLE SKIN: warm, dry Neuro: II - XII grossly intact.   Psych: Normal mood and affect.  Appropriate judgement and reason   Data Reviewed: I have personally reviewed following labs and imaging studies  CBC: Recent Labs  Lab 04/30/21 0038 05/01/21 0444  WBC 10.8* 10.0  HGB 14.1 13.2  HCT 40.9 38.4*  MCV 88.7 89.7  PLT 218 811   Basic Metabolic Panel: Recent Labs  Lab 04/30/21 0038 05/01/21 0444  NA 133* 137  K 4.7 4.3  CL 102 105  CO2 20* 24  GLUCOSE 163* 144*  BUN 18 21  CREATININE 1.03 1.14  CALCIUM 8.3* 7.8*  MG  --  2.3   GFR: Estimated Creatinine Clearance: 78.9 mL/min (by C-G formula based on SCr of 1.14 mg/dL). Liver Function Tests: Recent Labs  Lab 04/30/21 0038 04/30/21 0847 05/01/21 0444  AST 111* 77* 149*  ALT 147* 136* 212*  ALKPHOS 121 121 121  BILITOT 3.9* 1.7* 1.4*  PROT 6.8 6.6 6.2*  ALBUMIN 3.6 3.4*  3.0*   Recent Labs  Lab 04/30/21 0410 04/30/21 0847 05/01/21 0444  LIPASE 257* 199* 66*   No results for input(s): AMMONIA in the last 168 hours. Coagulation Profile: Recent Labs  Lab 04/30/21 0250  INR 1.1   Cardiac Enzymes: No results for input(s): CKTOTAL, CKMB, CKMBINDEX, TROPONINI in the last 168 hours. BNP (last 3 results) No results for input(s): PROBNP in the last 8760 hours. HbA1C: No results for input(s): HGBA1C in the last 72 hours. CBG: Recent Labs  Lab 04/30/21 0756 04/30/21 1203 04/30/21 2117  GLUCAP 121* 108* 195*   Lipid Profile: No results for input(s): CHOL, HDL, LDLCALC, TRIG,  CHOLHDL, LDLDIRECT in the last 72 hours. Thyroid Function Tests: No results for input(s): TSH, T4TOTAL, FREET4, T3FREE, THYROIDAB in the last 72 hours. Anemia Panel: No results for input(s): VITAMINB12, FOLATE, FERRITIN, TIBC, IRON, RETICCTPCT in the last 72 hours. Sepsis Labs: No results for input(s): PROCALCITON, LATICACIDVEN in the last 168 hours.  Recent Results (from the past 240 hour(s))  Resp Panel by RT-PCR (Flu A&B, Covid) Nasopharyngeal Swab     Status: None   Collection Time: 04/30/21  3:14 AM   Specimen: Nasopharyngeal Swab; Nasopharyngeal(NP) swabs in vial transport medium  Result Value Ref Range Status   SARS Coronavirus 2 by RT PCR NEGATIVE NEGATIVE Final    Comment: (NOTE) SARS-CoV-2 target nucleic acids are NOT DETECTED.  The SARS-CoV-2 RNA is generally detectable in upper respiratory specimens during the acute phase of infection. The lowest concentration of SARS-CoV-2 viral copies this assay can detect is 138 copies/mL. A negative result does not preclude SARS-Cov-2 infection and should not be used as the sole basis for treatment or other patient management decisions. A negative result may occur with  improper specimen collection/handling, submission of specimen other than nasopharyngeal swab, presence of viral mutation(s) within the areas targeted by this assay, and inadequate number of viral copies(<138 copies/mL). A negative result must be combined with clinical observations, patient history, and epidemiological information. The expected result is Negative.  Fact Sheet for Patients:  EntrepreneurPulse.com.au  Fact Sheet for Healthcare Providers:  IncredibleEmployment.be  This test is no t yet approved or cleared by the [place] FDA and  has been authorized for detection and/or diagnosis of SARS-CoV-2 by FDA under an Emergency Use Authorization (EUA). This EUA will remain  in effect (meaning this test can be used) for  the duration of the COVID-19 declaration under Section 564(b)(1) of the Act, 21 U.S.C.section 360bbb-3(b)(1), unless the authorization is terminated  or revoked sooner.       Influenza A by PCR NEGATIVE NEGATIVE Final   Influenza B by PCR NEGATIVE NEGATIVE Final    Comment: (NOTE) The Xpert Xpress SARS-CoV-2/FLU/RSV plus assay is intended as an aid in the diagnosis of influenza from Nasopharyngeal swab specimens and should not be used as a sole basis for treatment. Nasal washings and aspirates are unacceptable for Xpert Xpress SARS-CoV-2/FLU/RSV testing.  Fact Sheet for Patients: EntrepreneurPulse.com.au  Fact Sheet for Healthcare Providers: IncredibleEmployment.be  This test is not yet approved or cleared by the [place] FDA and has been authorized for detection and/or diagnosis of SARS-CoV-2 by FDA under an Emergency Use Authorization (EUA). This EUA will remain in effect (meaning this test can be used) for the duration of the COVID-19 declaration under Section 564(b)(1) of the Act, 21 U.S.C. section 360bbb-3(b)(1), unless the authorization is terminated or revoked.  Performed at [facility], [address] [place], [place] [address]  Surgical PCR screen     Status: Abnormal   Collection Time: 04/30/21  1:42 PM   Specimen: Nasal Mucosa; Nasal Swab  Result Value Ref Range Status   MRSA, PCR NEGATIVE NEGATIVE Final   Staphylococcus aureus POSITIVE (A) NEGATIVE Final    Comment: (NOTE) The Xpert SA Assay (FDA approved for NASAL specimens in patients 14 years of age and older), is one component of a comprehensive surveillance program. It is not intended to diagnose infection nor to guide or monitor treatment. Performed at [facility], [address] [place], [place] [address]       Radiology Studies: MR ABDOMEN MRCP WO CONTRAST  Result Date: 04/30/2021 CLINICAL DATA:  70 year old male with history of  right upper quadrant abdominal pain. Ultrasound was indicative of acute cholecystitis. EXAM: MRI ABDOMEN WITHOUT CONTRAST  (INCLUDING MRCP) TECHNIQUE: Multiplanar multisequence MR imaging of the abdomen was performed. Heavily T2-weighted images of the biliary and pancreatic ducts were obtained, and three-dimensional MRCP images were rendered by post processing. COMPARISON:  No prior abdominal MRI. Abdominal ultrasound 04/30/2021. FINDINGS: Comment: Today's study is limited for detection and characterization of visceral and/or vascular lesions by lack of IV gadolinium. Lower chest: Unremarkable. Hepatobiliary: Diffuse loss of signal intensity throughout the hepatic parenchyma on out of phase dual echo images, indicative of a background of hepatic steatosis. No suspicious cystic or solid hepatic lesions are confidently identified on today's noncontrast examination. Tiny filling defects are present within the lumen of the gallbladder, compatible with small gallstones. Gallbladder is moderately distended. Gallbladder wall appears thickened and edematous measuring up to 11 mm in thickness with trace volume of pericholecystic fluid. There are no filling defects within the common bile duct to suggest choledocholithiasis. Common bile duct measures 3 mm in the porta hepatis. No intrahepatic biliary ductal dilatation noted on MRCP images. Pancreas: In the inferior aspect of the pancreatic head (axial image 27 of series 7) there is a 9 mm T2 hyperintense lesion. In the pancreatic body (axial image 16 of series 7) there is an additional 6 mm T2 hyperintense lesion. No definite solid appearing pancreatic mass noted on today's noncontrast examination. No pancreatic ductal dilatation noted on MRCP images. No peripancreatic fluid collections or inflammatory changes. Spleen:  Unremarkable. Adrenals/Urinary Tract: There are several T1 hypointense and T2 hyperintense lesions in the kidneys, measuring up to 2.9 cm in diameter in the  medial aspect of the upper pole of the right kidney. In addition, in the upper pole of the right kidney laterally there is a 1.3 cm lesion (axial image 32 of series 19) which is mildly T1 hyperintense and T2 hyperintense. No hydroureteronephrosis in the visualized portions of the abdomen. Bilateral adrenal glands are normal in appearance. Stomach/Bowel: Visualized portions are unremarkable. Vascular/Lymphatic: No aneurysm identified in the visualized abdominal vasculature. No lymphadenopathy noted in the abdomen. Other: No significant volume of ascites noted in the visualized portions of the peritoneal cavity. Musculoskeletal: No aggressive appearing osseous lesions are noted in the visualize portions of the skeleton. Signal void noted in the lumbar spine related to indwelling spinal fixation hardware. IMPRESSION: 1. Study is positive for cholelithiasis with imaging findings suggestive of acute cholecystitis. No evidence of choledocholithiasis. No findings of biliary tract obstruction. 2. Subcentimeter cystic lesions in the pancreatic head and body. These are statistically likely to represent focal areas of side branch ectasia or small pancreatic pseudocysts, however, the possibility of side branch IPMN (intraductal papillary mucinous neoplasm is not excluded). Recommend follow up pre and post  contrast MRI/MRCP or pancreatic protocol CT in 2 years. This recommendation follows ACR consensus guidelines: Management of Incidental Pancreatic Cysts: A White Paper of the ACR Incidental Findings Committee. J Am Coll Radiol 9163;84:665-993. 3. Multiple renal lesions bilaterally, as above, incompletely characterized on today's noncontrast examination but likely to represent Bosniak class 1 and Bosniak class 2 cysts. Attention at time of repeat abdominal MRI is recommended to ensure stability. 4. Hepatic steatosis. Electronically Signed   By: Vinnie Langton M.D.   On: 04/30/2021 06:55   MR 3D Recon At Scanner  Result  Date: 04/30/2021 CLINICAL DATA:  70 year old male with history of right upper quadrant abdominal pain. Ultrasound was indicative of acute cholecystitis. EXAM: MRI ABDOMEN WITHOUT CONTRAST  (INCLUDING MRCP) TECHNIQUE: Multiplanar multisequence MR imaging of the abdomen was performed. Heavily T2-weighted images of the biliary and pancreatic ducts were obtained, and three-dimensional MRCP images were rendered by post processing. COMPARISON:  No prior abdominal MRI. Abdominal ultrasound 04/30/2021. FINDINGS: Comment: Today's study is limited for detection and characterization of visceral and/or vascular lesions by lack of IV gadolinium. Lower chest: Unremarkable. Hepatobiliary: Diffuse loss of signal intensity throughout the hepatic parenchyma on out of phase dual echo images, indicative of a background of hepatic steatosis. No suspicious cystic or solid hepatic lesions are confidently identified on today's noncontrast examination. Tiny filling defects are present within the lumen of the gallbladder, compatible with small gallstones. Gallbladder is moderately distended. Gallbladder wall appears thickened and edematous measuring up to 11 mm in thickness with trace volume of pericholecystic fluid. There are no filling defects within the common bile duct to suggest choledocholithiasis. Common bile duct measures 3 mm in the porta hepatis. No intrahepatic biliary ductal dilatation noted on MRCP images. Pancreas: In the inferior aspect of the pancreatic head (axial image 27 of series 7) there is a 9 mm T2 hyperintense lesion. In the pancreatic body (axial image 16 of series 7) there is an additional 6 mm T2 hyperintense lesion. No definite solid appearing pancreatic mass noted on today's noncontrast examination. No pancreatic ductal dilatation noted on MRCP images. No peripancreatic fluid collections or inflammatory changes. Spleen:  Unremarkable. Adrenals/Urinary Tract: There are several T1 hypointense and T2 hyperintense  lesions in the kidneys, measuring up to 2.9 cm in diameter in the medial aspect of the upper pole of the right kidney. In addition, in the upper pole of the right kidney laterally there is a 1.3 cm lesion (axial image 32 of series 19) which is mildly T1 hyperintense and T2 hyperintense. No hydroureteronephrosis in the visualized portions of the abdomen. Bilateral adrenal glands are normal in appearance. Stomach/Bowel: Visualized portions are unremarkable. Vascular/Lymphatic: No aneurysm identified in the visualized abdominal vasculature. No lymphadenopathy noted in the abdomen. Other: No significant volume of ascites noted in the visualized portions of the peritoneal cavity. Musculoskeletal: No aggressive appearing osseous lesions are noted in the visualize portions of the skeleton. Signal void noted in the lumbar spine related to indwelling spinal fixation hardware. IMPRESSION: 1. Study is positive for cholelithiasis with imaging findings suggestive of acute cholecystitis. No evidence of choledocholithiasis. No findings of biliary tract obstruction. 2. Subcentimeter cystic lesions in the pancreatic head and body. These are statistically likely to represent focal areas of side branch ectasia or small pancreatic pseudocysts, however, the possibility of side branch IPMN (intraductal papillary mucinous neoplasm is not excluded). Recommend follow up pre and post contrast MRI/MRCP or pancreatic protocol CT in 2 years. This recommendation follows ACR consensus guidelines: Management of Incidental  Pancreatic Cysts: A White Paper of the ACR Incidental Findings Committee. J Am Coll Radiol 6301;60:109-323. 3. Multiple renal lesions bilaterally, as above, incompletely characterized on today's noncontrast examination but likely to represent Bosniak class 1 and Bosniak class 2 cysts. Attention at time of repeat abdominal MRI is recommended to ensure stability. 4. Hepatic steatosis. Electronically Signed   By: Vinnie Langton  M.D.   On: 04/30/2021 06:55   US Abdomen Limited RUQ (LIVER/GB)  Result Date: 04/30/2021 CLINICAL DATA:  Elevated liver function studies. Elevated bilirubin. Right upper quadrant pain for 4 days. EXAM: ULTRASOUND ABDOMEN LIMITED RIGHT UPPER QUADRANT COMPARISON:  01/27/2018 FINDINGS: Gallbladder: Cholelithiasis with sludge and small stones in the gallbladder. Largest stone measures about 4 mm. There is evidence of mild gallbladder wall thickening to 4 mm with pericholecystic edema. Murphy's sign is positive. Common bile duct: Diameter: 4 mm, normal Liver: Diffusely increased hepatic parenchymal echotexture consistent with fatty infiltration. No focal lesions identified. Portal vein is patent on color Doppler imaging with normal direction of blood flow towards the liver. Other: None. IMPRESSION: Cholelithiasis, gallbladder wall thickening, and edema with positive Murphy's sign. Findings are compatible with acute cholecystitis. Electronically Signed   By: Lucienne Capers M.D.   On: 04/30/2021 02:38     Scheduled Meds:  vitamin C  1,000 mg Oral Daily   cholecalciferol  1,000 Units Oral Daily   guaiFENesin  600 mg Oral BID   loratadine  10 mg Oral Daily   metoprolol succinate  50 mg Oral Daily   multivitamin with minerals  1 tablet Oral Daily   mupirocin ointment  1 application Nasal BID   omega-3 acid ethyl esters  1 g Oral Daily   ondansetron (ZOFRAN) IV  4 mg Intravenous Once   pantoprazole  40 mg Oral Daily   tamsulosin  0.4 mg Oral Daily   Vitamin E  400 Units Oral Daily   Continuous Infusions:  sodium chloride     piperacillin-tazobactam Stopped (05/01/21 1644)     LOS: 1 day     Enzo Bi, MD [facility] If 7PM-7AM, please contact night-coverage 05/01/2021, 6:13 PM

## 2021-05-01 NOTE — Op Note (Signed)
Procedure Date:  05/01/2021  Pre-operative Diagnosis:  Acute cholecystitis, umbilical hernia  Post-operative Diagnosis:  Acute cholecystitis, umbilical hernia and ventral hernia  Procedure:   Robotic assisted cholecystectomy with ICG FireFly cholangiogram Open umbilical hernia repair  Surgeon:  Melvyn Neth, MD  Anesthesia:  General endotracheal  Estimated Blood Loss:  100 ml  Specimens:  gallbladder  Complications:  None  Indications for Procedure:  [name] who presents with abdominal pain and workup revealing acute cholecystitis with gallstone pancreatitis.  His pancreatitis was resolving and his LFTs were improving.  MRCP was negative for choledocholithiasis.  The benefits, complications, treatment options, and expected outcomes were discussed with the patient. The risks of bleeding, infection, recurrence of symptoms, failure to resolve symptoms, bile duct damage, bile duct leak, retained common bile duct stone, bowel injury, and need for further procedures were all discussed with the patient and he was willing to proceed.  Description of Procedure: The patient was correctly identified in the preoperative area and brought into the operating room.  The patient was placed supine with VTE prophylaxis in place.  Appropriate time-outs were performed.  Anesthesia was induced and the patient was intubated.  Appropriate antibiotics were infused.  The abdomen was prepped and draped in a sterile fashion. An infraumbilical incision was made. Cautery was used to dissect along the umbilical stalk and to detach the stalk from the underlying fascia.  This revealed a 2 cm umbilical hernia defect.  The fascial edges were cleared using cautery, and a 12 mm robotic port was inserted.  Pneumoperitoneum was obtained with appropriate opening pressures.  Three 8-mm ports were placed in the mid abdomen at the level of the umbilicus under direct visualization.  The DaVinci platform was docked,  camera targeted, and instruments were placed under direct visualization.  On initial inspection, the patient was also found to have an asymptomatic epigastric ventral hernia, which was small, about 1 cm in size.  This was opted to not be repaired at this time.  There were some adhesions in the RLQ near our right-most port, likely due to his prior open appendectomy.  These adhesions were taken down using cautery.  The gallbladder was identified and found to be significantly inflamed, with surrounding inflammation of the omentum/mesentery.  The fundus was grasped and retracted cephalad.  Adhesions were lysed bluntly and with electrocautery. The infundibulum was grasped and retracted laterally, exposing the peritoneum overlying the gallbladder.  This was incised with electrocautery and extended on either side of the gallbladder.  Careful dissection was done to identify the cystic duct and the cystic artery.  FireFly cholangiogram was then obtained, but there was no visualization of the the ICG, likely because of too much inflammation. Further dissection was done, and I could clearly identify only two structures going to the gallbladder, achieving the critical view of safety.  Due to the inflammation, I decided to take the cystic duct using the robotic stapler, blue load.  No complications from this.  The cystic artery was clipped twice proximally and once distally, cutting in between.  The gallbladder was taken from the gallbladder fossa in a retrograde fashion with electrocautery. The gallbladder was placed in an Endocatch bag. The liver bed was inspected and any bleeding was controlled with electrocautery. The right upper quadrant was then inspected again revealing intact clips and staple line, no bleeding, and no ductal injury.  The area was thoroughly irrigated.    The DaVinci platform was undocked at this point  and instruments removed.  A 19 Fr. Blake drain was introduced through the right lateral port side  and guided to the RUQ under the liver.  The 8 mm ports were removed under direct visualization and the 12 mm port was removed.  The Endocatch bag was brought out via the umbilical incision. The umbilical hernia defect was closed using 0 vicryl sutures.  Local anesthetic was infused in all incisions.  The umbilical stalk was reattached using 2-0 vicryl, and the umbilical incision was closed in layers using 3-0 Vicryl and 4-0 Monocryl.  The remaining port incisions were closed with 4-0 Monocryl.  The wounds were cleaned and sealed with DermaBond.  The drain was secured to the skin using 3-0 Nylon, and the drain was dressed with 4x4 gauze and TegaDerm.  The patient was emerged from anesthesia and extubated and brought to the recovery room for further management.  The patient tolerated the procedure well and all counts were correct at the end of the case.   Melvyn Neth, MD

## 2021-05-01 NOTE — Progress Notes (Addendum)
Jackson Antigua, MD [address] [place], [place], [place], [address] [address], [place], [place], [place], [address] Phone: [phone]  Fax: [phone]   Subjective: Patient underwent robotic cholecystectomy yesterday with open umbilical hernia repair.  States he feels like he just had surgery.  Describes having some abdominal discomfort that is dull.  He is wearing a towel over his head.  No nausea or vomiting.   Objective: Exam: Vital signs in last 24 hours: Vitals:   04/30/21 2245 05/01/21 0025 05/01/21 0500 05/01/21 0817  BP: (!) 143/73 122/67 116/71 130/71  Pulse: 61 65 61 61  Resp: '20 19 19 20  ' Temp: 98.1 F (36.7 C) 98 F (36.7 C) 98 F (36.7 C) 98.9 F (37.2 C)  TempSrc: Oral Oral Oral Oral  SpO2: 93% 99% 96% 96%  Weight:      Height:       Weight change: -0.454 kg  Intake/Output Summary (Last 24 hours) at 05/01/2021 0924 Last data filed at 05/01/2021 0825 Gross per 24 hour  Intake 2030.65 ml  Output 915 ml  Net 1115.65 ml    Constitutional: General:   Alert,  Well-developed, well-nourished, pleasant and cooperative in NAD BP 130/71 (BP Location: Right Arm)   Pulse 61   Temp 98.9 F (37.2 C) (Oral)   Resp 20   Ht '6\' 1"'  (1.854 m)   Wt 104.8 kg   SpO2 96%   BMI 30.48 kg/m   Eyes:  Sclera clear, no icterus.   Conjunctiva pink.   Ears:  No scars, lesions or masses, Normal auditory acuity. Nose:  No deformity, discharge, or lesions. Mouth:  No deformity or lesions, oropharynx pink & moist.  Neck:  Supple; no masses, trachea midline  Respiratory: Normal respiratory effort  Gastrointestinal:  Soft, slightly distended compared to yesterday, no hepatosplenomegaly .  No guarding or rebound tenderness.     Cardiac: No clubbing or edema.  No cyanosis. Normal posterior tibial pedal pulses noted.  Lymphatic:  No significant cervical adenopathy.  Psych:  Alert and cooperative. Normal mood and affect.  Musculoskeletal:  Head normocephalic,  atraumatic. 5/5 Lower extremity strength bilaterally.  Skin: Warm. Intact without significant lesions or rashes. No jaundice.  Neurologic:  Face symmetrical, tongue midline, Normal sensation to touch  Psych:  Alert and oriented x3, Alert and cooperative. Normal mood and affect.   Lab Results: Lab Results  Component Value Date   WBC 10.0 05/01/2021   HGB 13.2 05/01/2021   HCT 38.4 (L) 05/01/2021   MCV 89.7 05/01/2021   PLT 245 05/01/2021   CMP     Component Value Date/Time   NA 137 05/01/2021 0444   K 4.3 05/01/2021 0444   CL 105 05/01/2021 0444   CO2 24 05/01/2021 0444   GLUCOSE 144 (H) 05/01/2021 0444   BUN 21 05/01/2021 0444   CREATININE 1.14 05/01/2021 0444   CALCIUM 7.8 (L) 05/01/2021 0444   PROT 6.2 (L) 05/01/2021 0444   ALBUMIN 3.0 (L) 05/01/2021 0444   AST 149 (H) 05/01/2021 0444   ALT 212 (H) 05/01/2021 0444   ALKPHOS 121 05/01/2021 0444   BILITOT 1.4 (H) 05/01/2021 0444   GFRNONAA >60 05/01/2021 0444     Micro Results: Recent Results (from the past 240 hour(s))  Resp Panel by RT-PCR (Flu A&B, Covid) Nasopharyngeal Swab     Status: None   Collection Time: 04/30/21  3:14 AM   Specimen: Nasopharyngeal Swab; Nasopharyngeal(NP) swabs in vial transport medium  Result Value Ref Range  Status   SARS Coronavirus 2 by RT PCR NEGATIVE NEGATIVE Final    Comment: (NOTE) SARS-CoV-2 target nucleic acids are NOT DETECTED.  The SARS-CoV-2 RNA is generally detectable in upper respiratory specimens during the acute phase of infection. The lowest concentration of SARS-CoV-2 viral copies this assay can detect is 138 copies/mL. A negative result does not preclude SARS-Cov-2 infection and should not be used as the sole basis for treatment or other patient management decisions. A negative result may occur with  improper specimen collection/handling, submission of specimen other than nasopharyngeal swab, presence of viral mutation(s) within the areas targeted by this assay, and  inadequate number of viral copies(<138 copies/mL). A negative result must be combined with clinical observations, patient history, and epidemiological information. The expected result is Negative.  Fact Sheet for Patients:  EntrepreneurPulse.com.au  Fact Sheet for Healthcare Providers:  IncredibleEmployment.be  This test is no t yet approved or cleared by the [place] FDA and  has been authorized for detection and/or diagnosis of SARS-CoV-2 by FDA under an Emergency Use Authorization (EUA). This EUA will remain  in effect (meaning this test can be used) for the duration of the COVID-19 declaration under Section 564(b)(1) of the Act, 21 U.S.C.section 360bbb-3(b)(1), unless the authorization is terminated  or revoked sooner.       Influenza A by PCR NEGATIVE NEGATIVE Final   Influenza B by PCR NEGATIVE NEGATIVE Final    Comment: (NOTE) The Xpert Xpress SARS-CoV-2/FLU/RSV plus assay is intended as an aid in the diagnosis of influenza from Nasopharyngeal swab specimens and should not be used as a sole basis for treatment. Nasal washings and aspirates are unacceptable for Xpert Xpress SARS-CoV-2/FLU/RSV testing.  Fact Sheet for Patients: EntrepreneurPulse.com.au  Fact Sheet for Healthcare Providers: IncredibleEmployment.be  This test is not yet approved or cleared by the [place] FDA and has been authorized for detection and/or diagnosis of SARS-CoV-2 by FDA under an Emergency Use Authorization (EUA). This EUA will remain in effect (meaning this test can be used) for the duration of the COVID-19 declaration under Section 564(b)(1) of the Act, 21 U.S.C. section 360bbb-3(b)(1), unless the authorization is terminated or revoked.  Performed at [facility], [address], [place], [place] [address]   Surgical PCR screen     Status: Abnormal   Collection Time: 04/30/21  1:42 PM    Specimen: Nasal Mucosa; Nasal Swab  Result Value Ref Range Status   MRSA, PCR NEGATIVE NEGATIVE Final   Staphylococcus aureus POSITIVE (A) NEGATIVE Final    Comment: (NOTE) The Xpert SA Assay (FDA approved for NASAL specimens in patients 73 years of age and older), is one component of a comprehensive surveillance program. It is not intended to diagnose infection nor to guide or monitor treatment. Performed at [facility], [address] [place], [place] [address]    Studies/Results: MR ABDOMEN MRCP WO CONTRAST  Result Date: 04/30/2021 CLINICAL DATA:  70 year old male with history of right upper quadrant abdominal pain. Ultrasound was indicative of acute cholecystitis. EXAM: MRI ABDOMEN WITHOUT CONTRAST  (INCLUDING MRCP) TECHNIQUE: Multiplanar multisequence MR imaging of the abdomen was performed. Heavily T2-weighted images of the biliary and pancreatic ducts were obtained, and three-dimensional MRCP images were rendered by post processing. COMPARISON:  No prior abdominal MRI. Abdominal ultrasound 04/30/2021. FINDINGS: Comment: Today's study is limited for detection and characterization of visceral and/or vascular lesions by lack of IV gadolinium. Lower chest: Unremarkable. Hepatobiliary: Diffuse loss of signal intensity throughout the hepatic parenchyma on  out of phase dual echo images, indicative of a background of hepatic steatosis. No suspicious cystic or solid hepatic lesions are confidently identified on today's noncontrast examination. Tiny filling defects are present within the lumen of the gallbladder, compatible with small gallstones. Gallbladder is moderately distended. Gallbladder wall appears thickened and edematous measuring up to 11 mm in thickness with trace volume of pericholecystic fluid. There are no filling defects within the common bile duct to suggest choledocholithiasis. Common bile duct measures 3 mm in the porta hepatis. No intrahepatic biliary ductal dilatation  noted on MRCP images. Pancreas: In the inferior aspect of the pancreatic head (axial image 27 of series 7) there is a 9 mm T2 hyperintense lesion. In the pancreatic body (axial image 16 of series 7) there is an additional 6 mm T2 hyperintense lesion. No definite solid appearing pancreatic mass noted on today's noncontrast examination. No pancreatic ductal dilatation noted on MRCP images. No peripancreatic fluid collections or inflammatory changes. Spleen:  Unremarkable. Adrenals/Urinary Tract: There are several T1 hypointense and T2 hyperintense lesions in the kidneys, measuring up to 2.9 cm in diameter in the medial aspect of the upper pole of the right kidney. In addition, in the upper pole of the right kidney laterally there is a 1.3 cm lesion (axial image 32 of series 19) which is mildly T1 hyperintense and T2 hyperintense. No hydroureteronephrosis in the visualized portions of the abdomen. Bilateral adrenal glands are normal in appearance. Stomach/Bowel: Visualized portions are unremarkable. Vascular/Lymphatic: No aneurysm identified in the visualized abdominal vasculature. No lymphadenopathy noted in the abdomen. Other: No significant volume of ascites noted in the visualized portions of the peritoneal cavity. Musculoskeletal: No aggressive appearing osseous lesions are noted in the visualize portions of the skeleton. Signal void noted in the lumbar spine related to indwelling spinal fixation hardware. IMPRESSION: 1. Study is positive for cholelithiasis with imaging findings suggestive of acute cholecystitis. No evidence of choledocholithiasis. No findings of biliary tract obstruction. 2. Subcentimeter cystic lesions in the pancreatic head and body. These are statistically likely to represent focal areas of side branch ectasia or small pancreatic pseudocysts, however, the possibility of side branch IPMN (intraductal papillary mucinous neoplasm is not excluded). Recommend follow up pre and post contrast  MRI/MRCP or pancreatic protocol CT in 2 years. This recommendation follows ACR consensus guidelines: Management of Incidental Pancreatic Cysts: A White Paper of the ACR Incidental Findings Committee. J Am Coll Radiol 6967;89:381-017. 3. Multiple renal lesions bilaterally, as above, incompletely characterized on today's noncontrast examination but likely to represent Bosniak class 1 and Bosniak class 2 cysts. Attention at time of repeat abdominal MRI is recommended to ensure stability. 4. Hepatic steatosis. Electronically Signed   By: Vinnie Langton M.D.   On: 04/30/2021 06:55   MR 3D Recon At Scanner  Result Date: 04/30/2021 CLINICAL DATA:  70 year old male with history of right upper quadrant abdominal pain. Ultrasound was indicative of acute cholecystitis. EXAM: MRI ABDOMEN WITHOUT CONTRAST  (INCLUDING MRCP) TECHNIQUE: Multiplanar multisequence MR imaging of the abdomen was performed. Heavily T2-weighted images of the biliary and pancreatic ducts were obtained, and three-dimensional MRCP images were rendered by post processing. COMPARISON:  No prior abdominal MRI. Abdominal ultrasound 04/30/2021. FINDINGS: Comment: Today's study is limited for detection and characterization of visceral and/or vascular lesions by lack of IV gadolinium. Lower chest: Unremarkable. Hepatobiliary: Diffuse loss of signal intensity throughout the hepatic parenchyma on out of phase dual echo images, indicative of a background of hepatic steatosis. No suspicious cystic or solid  hepatic lesions are confidently identified on today's noncontrast examination. Tiny filling defects are present within the lumen of the gallbladder, compatible with small gallstones. Gallbladder is moderately distended. Gallbladder wall appears thickened and edematous measuring up to 11 mm in thickness with trace volume of pericholecystic fluid. There are no filling defects within the common bile duct to suggest choledocholithiasis. Common bile duct measures 3  mm in the porta hepatis. No intrahepatic biliary ductal dilatation noted on MRCP images. Pancreas: In the inferior aspect of the pancreatic head (axial image 27 of series 7) there is a 9 mm T2 hyperintense lesion. In the pancreatic body (axial image 16 of series 7) there is an additional 6 mm T2 hyperintense lesion. No definite solid appearing pancreatic mass noted on today's noncontrast examination. No pancreatic ductal dilatation noted on MRCP images. No peripancreatic fluid collections or inflammatory changes. Spleen:  Unremarkable. Adrenals/Urinary Tract: There are several T1 hypointense and T2 hyperintense lesions in the kidneys, measuring up to 2.9 cm in diameter in the medial aspect of the upper pole of the right kidney. In addition, in the upper pole of the right kidney laterally there is a 1.3 cm lesion (axial image 32 of series 19) which is mildly T1 hyperintense and T2 hyperintense. No hydroureteronephrosis in the visualized portions of the abdomen. Bilateral adrenal glands are normal in appearance. Stomach/Bowel: Visualized portions are unremarkable. Vascular/Lymphatic: No aneurysm identified in the visualized abdominal vasculature. No lymphadenopathy noted in the abdomen. Other: No significant volume of ascites noted in the visualized portions of the peritoneal cavity. Musculoskeletal: No aggressive appearing osseous lesions are noted in the visualize portions of the skeleton. Signal void noted in the lumbar spine related to indwelling spinal fixation hardware. IMPRESSION: 1. Study is positive for cholelithiasis with imaging findings suggestive of acute cholecystitis. No evidence of choledocholithiasis. No findings of biliary tract obstruction. 2. Subcentimeter cystic lesions in the pancreatic head and body. These are statistically likely to represent focal areas of side branch ectasia or small pancreatic pseudocysts, however, the possibility of side branch IPMN (intraductal papillary mucinous neoplasm  is not excluded). Recommend follow up pre and post contrast MRI/MRCP or pancreatic protocol CT in 2 years. This recommendation follows ACR consensus guidelines: Management of Incidental Pancreatic Cysts: A White Paper of the ACR Incidental Findings Committee. J Am Coll Radiol 8184;03:754-360. 3. Multiple renal lesions bilaterally, as above, incompletely characterized on today's noncontrast examination but likely to represent Bosniak class 1 and Bosniak class 2 cysts. Attention at time of repeat abdominal MRI is recommended to ensure stability. 4. Hepatic steatosis. Electronically Signed   By: Vinnie Langton M.D.   On: 04/30/2021 06:55   US Abdomen Limited RUQ (LIVER/GB)  Result Date: 04/30/2021 CLINICAL DATA:  Elevated liver function studies. Elevated bilirubin. Right upper quadrant pain for 4 days. EXAM: ULTRASOUND ABDOMEN LIMITED RIGHT UPPER QUADRANT COMPARISON:  01/27/2018 FINDINGS: Gallbladder: Cholelithiasis with sludge and small stones in the gallbladder. Largest stone measures about 4 mm. There is evidence of mild gallbladder wall thickening to 4 mm with pericholecystic edema. Murphy's sign is positive. Common bile duct: Diameter: 4 mm, normal Liver: Diffusely increased hepatic parenchymal echotexture consistent with fatty infiltration. No focal lesions identified. Portal vein is patent on color Doppler imaging with normal direction of blood flow towards the liver. Other: None. IMPRESSION: Cholelithiasis, gallbladder wall thickening, and edema with positive Murphy's sign. Findings are compatible with acute cholecystitis. Electronically Signed   By: Lucienne Capers M.D.   On: 04/30/2021 02:38   Medications:  Scheduled Meds:  vitamin C  1,000 mg Oral Daily   cholecalciferol  1,000 Units Oral Daily   guaiFENesin  600 mg Oral BID   loratadine  10 mg Oral Daily   metoprolol succinate  50 mg Oral Daily   multivitamin with minerals  1 tablet Oral Daily   mupirocin ointment  1 application Nasal BID    omega-3 acid ethyl esters  1 g Oral Daily   ondansetron (ZOFRAN) IV  4 mg Intravenous Once   pantoprazole  40 mg Oral Daily   tamsulosin  0.4 mg Oral Daily   Vitamin E  400 Units Oral Daily   Continuous Infusions:  sodium chloride 100 mL/hr at 05/01/21 0617   piperacillin-tazobactam 3.375 g (05/01/21 0807)   PRN Meds:.acetaminophen, azelastine, HYDROmorphone (DILAUDID) injection, ketorolac, magnesium hydroxide, ondansetron **OR** ondansetron (ZOFRAN) IV, oxyCODONE, traZODone   Assessment: Active Problems:   Gallstone pancreatitis   Cholecystitis Elevated liver enzymes Hyponatremia  Plan: Patient is status postcholecystectomy and open umbilical hernia repair last night  Feels some discomfort post surgery, but states is overall better since admission as far as of his abdominal pain is concerned  Given the clinical picture, pancreatitis appears to be improving overall, also as evidenced by improvement in lipase to 66 compared to 199 and 257 prior to today.  His transaminases have worsened today compared to on admission.  This is most likely transient, and related to acute issues on this current admission including cholecystitis, pancreatitis, with possible mild dehydration causing this picture  Avoid hepatotoxic drugs and follow transaminases at least once a day until it improves.  If transaminases worsen, we may need to obtain further work-up  Total bilirubin has continued to improve since admission, and alk phos is normal, consistent with no further biliary obstruction  Continue pain control as needed  Hyponatremia resolved   LOS: 1 day   Jackson Antigua, MD 05/01/2021, 9:24 AM

## 2021-05-02 LAB — GLUCOSE, CAPILLARY: Glucose-Capillary: 134 mg/dL — ABNORMAL HIGH (ref 70–99)

## 2021-05-02 LAB — COMPREHENSIVE METABOLIC PANEL
ALT: 170 U/L — ABNORMAL HIGH (ref 0–44)
AST: 94 U/L — ABNORMAL HIGH (ref 15–41)
Albumin: 2.8 g/dL — ABNORMAL LOW (ref 3.5–5.0)
Alkaline Phosphatase: 87 U/L (ref 38–126)
Anion gap: 5 (ref 5–15)
BUN: 17 mg/dL (ref 8–23)
CO2: 24 mmol/L (ref 22–32)
Calcium: 7.7 mg/dL — ABNORMAL LOW (ref 8.9–10.3)
Chloride: 106 mmol/L (ref 98–111)
Creatinine, Ser: 1.14 mg/dL (ref 0.61–1.24)
GFR, Estimated: >60 mL/min (ref >60)
Glucose, Bld: 124 mg/dL — ABNORMAL HIGH (ref 70–99)
Potassium: 3.9 mmol/L (ref 3.5–5.1)
Sodium: 135 mmol/L (ref 135–145)
Total Bilirubin: 1.1 mg/dL (ref 0.3–1.2)
Total Protein: 6.2 g/dL — ABNORMAL LOW (ref 6.5–8.1)

## 2021-05-02 LAB — CBC
HCT: 36.9 % — ABNORMAL LOW (ref 39.0–52.0)
Hemoglobin: 12.8 g/dL — ABNORMAL LOW (ref 13.0–17.0)
MCH: 30.8 pg (ref 26.0–34.0)
MCHC: 34.7 g/dL (ref 30.0–36.0)
MCV: 88.9 fL (ref 80.0–100.0)
Platelets: 227 10*3/uL (ref 150–400)
RBC: 4.15 MIL/uL — ABNORMAL LOW (ref 4.22–5.81)
RDW: 14.2 % (ref 11.5–15.5)
WBC: 9.4 10*3/uL (ref 4.0–10.5)
nRBC: 0 % (ref 0.0–0.2)

## 2021-05-02 LAB — MAGNESIUM: Magnesium: 2.3 mg/dL (ref 1.7–2.4)

## 2021-05-02 LAB — SURGICAL PATHOLOGY

## 2021-05-02 MED ORDER — AZELASTINE HCL 0.1 % NA SOLN: 1.0000 | Freq: Two times a day (BID) | NASAL | Status: DC | PRN

## 2021-05-02 MED ORDER — OMEPRAZOLE 40 MG PO CPDR: 40.0000 mg | DELAYED_RELEASE_CAPSULE | Freq: Every day | ORAL | Status: DC

## 2021-05-02 MED ORDER — AMOXICILLIN-POT CLAVULANATE 875-125 MG PO TABS: 1.0000 | ORAL_TABLET | Freq: Two times a day (BID) | ORAL | 0 refills | Status: AC

## 2021-05-02 MED ORDER — TAMSULOSIN HCL 0.4 MG PO CAPS: 0.4000 mg | ORAL_CAPSULE | Freq: Every day | ORAL | Status: DC

## 2021-05-02 MED ORDER — IBUPROFEN 600 MG PO TABS: 600.0000 mg | ORAL_TABLET | Freq: Four times a day (QID) | ORAL | 0 refills | Status: DC | PRN

## 2021-05-02 MED ORDER — TRULICITY 0.75 MG/0.5ML ~~LOC~~ SOAJ: 0.7500 mg | SUBCUTANEOUS | Status: DC

## 2021-05-02 MED ORDER — OXYCODONE HCL 5 MG PO TABS: 5.0000 mg | ORAL_TABLET | Freq: Four times a day (QID) | ORAL | 0 refills | Status: DC | PRN

## 2021-05-02 NOTE — Care Management Important Message (Signed)
Important Message  Patient Details  Name: Jackson [name] MRN: [id number] Date of Birth: October 28, 1950   Medicare Important Message Given:  Yes     Dannette Barbara 05/02/2021, 11:30 AM

## 2021-05-02 NOTE — Discharge Instructions (Signed)
In addition to included general post-operative instructions,  Diet: Resume home diet. Recommend avoiding or limiting fatty/greasy foods over the next few days/week. If you do eat these, you may (or may not) notice diarrhea. This is expected while your body adjusts to not having a gallbladder, and it typically resolves with time.    Activity: No heavy lifting >20 pounds (children, pets, laundry, garbage) or strenuous activity for 4 weeks, but light activity and walking are encouraged. Do not drive or drink alcohol if taking narcotic pain medications or having pain that might distract from driving.  Wound care: If you can keep the drain site waterproof, you may shower/get incision wet with soapy water and pat dry (do not rub incisions), but no baths or submerging incision underwater until follow-up.   Drain: Monitor and record output daily; handout given.   Medications: Resume all home medications. For mild to moderate pain: acetaminophen (Tylenol) or ibuprofen/naproxen (if no kidney disease). Combining Tylenol with alcohol can substantially increase your risk of causing liver disease. Narcotic pain medications, if prescribed, can be used for severe pain, though may cause nausea, constipation, and drowsiness. Do not combine Tylenol and Percocet (or similar) within a 6 hour period as Percocet (and similar) contain(s) Tylenol. If you do not need the narcotic pain medication, you do not need to fill the prescription.  Call office [phone] / [phone]) at any time if any questions, worsening pain, fevers/chills, bleeding, drainage from incision site, or other concerns.

## 2021-05-02 NOTE — Discharge Summary (Signed)
Physician Discharge Summary   Jackson [name]  male DOB: Jun 01, 1951  NIO:[id number]  PCP: [name], [name], MD  Admit date: 04/30/2021 Discharge date: 05/02/2021  Admitted From: home Disposition:  home CODE STATUS: Full code  Discharge Instructions     Discharge instructions   Complete by: As directed    You have your gallbladder taken out.  Please finish taking antibiotic Augmentin at home as directed.  Please follow up with Surgery as outpatient for drain removal.   Dr. Enzo Bi [facility] Course:  For full details, please see H&P, progress notes, consult notes and ancillary notes.  Briefly,  Jackson [name] is a 70 y.o. Caucasian male with medical history significant for type 2 diabetes mellitus, dyslipidemia, hypertension, GERD, obstructive sleep apnea on home CPAP, rheumatic fever and SVT, who presented emergency room with right upper quadrant abdominal pain with associated nausea without vomiting.     1.  Acute calculus cholecystitis  with jaundice and elevated transaminases. S/p cholecystectomy on 04/30/21 -MRCP neg for choledocholithiasis --started on IV zosyn --cholecystectomy with Dr. Hampton Abbot with abdominal drain placed due to significant inflammation and infected gallbladder.  --Pt received 4 days of IV zosyn and was discharged on 7 more days of Augmentin.   --Pt will follow up with Surgery for drain removal.   acute gallstone pancreatitis  --lipase 257 on presentation, trended down with IVF  Essential hypertension. --cont Toprol  Type 2 diabetes mellitus. - recent A1c 6.8.  On metformin and Trulicity at home. --SSI while inpatient --discharged back on home regimen.  BPH. - cont Flomax.   GERD --cont PPI   Discharge Diagnoses:  Active Problems:   Gallstone pancreatitis   Cholecystitis   30 Day Unplanned Readmission Risk Score    Flowsheet Row ED to Hosp-Admission (Current) from 04/30/2021 in [place]  30 Day Unplanned Readmission Risk Score (%) 16.36 Filed at 05/02/2021 0801       This score is the patient's risk of an unplanned readmission within 30 days of being discharged (0 -100%). The score is based on dignosis, age, lab data, medications, orders, and past utilization.   Low:  0-14.9   Medium: 15-21.9   High: 22-29.9   Extreme: 30 and above          Discharge Instructions:  Allergies as of 05/02/2021       Reactions   Januvia [sitagliptin]    Kidney pain   Lipitor [atorvastatin]    10 mg ?myalgias   Statins Other (See Comments)   Myalgias; atorvastatin 10 mg, pravastatin 10 mg, rosuvastatin w/ unknown dose.         Medication List     TAKE these medications    amoxicillin-clavulanate 875-125 MG tablet Commonly known as: Augmentin Take 1 tablet by mouth 2 (two) times daily for 7 days.   azelastine 0.1 % nasal spray Commonly known as: ASTELIN Place 1 spray into both nostrils 2 (two) times daily as needed for rhinitis or allergies. Use in each nostril as directed What changed: See the new instructions.   cetirizine 10 MG tablet Commonly known as: ZYRTEC Take 10 mg by mouth at bedtime.   cholecalciferol 25 MCG (1000 UNIT) tablet Commonly known as: VITAMIN D3 Take 1,000 Units by mouth daily.   CoQ10 200 MG Caps Take 200 mg by mouth daily.   Fish Oil 1200 MG Cpdr Take 1 tablet by mouth daily.  FreeStyle Libre 2 Sensor Misc Use to check glucose Q8H   ibuprofen 600 MG tablet Commonly known as: ADVIL Take 1 tablet (600 mg total) by mouth every 6 (six) hours as needed.   metFORMIN 1000 MG tablet Commonly known as: GLUCOPHAGE TAKE 1 TABLET TWICE A DAY WITH MEALS   metoprolol succinate 50 MG 24 hr tablet Commonly known as: TOPROL-XL TAKE 1 TABLET DAILY WITH OR IMMEDIATELY FOLLOWING A MEAL (DOSE CHANGE) What changed: See the new instructions.   MULTIVITAMIN ADULT PO Take 1 tablet by mouth daily.   omeprazole 40 MG  capsule Commonly known as: PRILOSEC Take 1 capsule (40 mg total) by mouth at bedtime.   Red Yeast Rice 600 MG Tabs Take 600 mg by mouth 2 (two) times daily with a meal.   tamsulosin 0.4 MG Caps capsule Commonly known as: FLOMAX Take 1 capsule (0.4 mg total) by mouth at bedtime.   triamcinolone ointment 0.1 % Commonly known as: KENALOG Apply 1 application topically daily. As needed and cover with bandaid. Avoid face, groin, underarms.   Trulicity 7.89 FY/1.0FB Sopn Generic drug: Dulaglutide Inject 0.75 mg into the skin every Tuesday. Start taking on: May 06, 2021   vitamin C 500 MG tablet Commonly known as: ASCORBIC ACID Take 1,000 mg by mouth daily.   Vitamin E 400 units Tabs Take 400 Units by mouth daily.         Follow-up Information     Olean Ree, MD. Schedule an appointment as soon as possible for a visit on 05/07/2021.   Specialty: General Surgery Why: follow up next week for, s/p laparoscopic cholecystectomy, has drain Contact information: [address] [place] [address] [phone]         [name], [name], MD Follow up in 1 week(s).   Specialty: Internal Medicine Contact information: [place] [address] [phone]         Minna Merritts, MD .   Specialty: Cardiology Contact information: [address] [place] [address] [phone]                 Allergies  Allergen Reactions   Januvia [Sitagliptin]     Kidney pain    Lipitor [Atorvastatin]     10 mg ?myalgias   Statins Other (See Comments)    Myalgias; atorvastatin 10 mg, pravastatin 10 mg, rosuvastatin w/ unknown dose.      The results of significant diagnostics from this hospitalization (including imaging, microbiology, ancillary and laboratory) are listed below for reference.   Consultations:   Procedures/Studies: MR ABDOMEN MRCP WO CONTRAST  Result Date: 04/30/2021 CLINICAL DATA:   70 year old male with history of right upper quadrant abdominal pain. Ultrasound was indicative of acute cholecystitis. EXAM: MRI ABDOMEN WITHOUT CONTRAST  (INCLUDING MRCP) TECHNIQUE: Multiplanar multisequence MR imaging of the abdomen was performed. Heavily T2-weighted images of the biliary and pancreatic ducts were obtained, and three-dimensional MRCP images were rendered by post processing. COMPARISON:  No prior abdominal MRI. Abdominal ultrasound 04/30/2021. FINDINGS: Comment: Today's study is limited for detection and characterization of visceral and/or vascular lesions by lack of IV gadolinium. Lower chest: Unremarkable. Hepatobiliary: Diffuse loss of signal intensity throughout the hepatic parenchyma on out of phase dual echo images, indicative of a background of hepatic steatosis. No suspicious cystic or solid hepatic lesions are confidently identified on today's noncontrast examination. Tiny filling defects are present within the lumen of the gallbladder, compatible with small gallstones. Gallbladder is moderately distended. Gallbladder  wall appears thickened and edematous measuring up to 11 mm in thickness with trace volume of pericholecystic fluid. There are no filling defects within the common bile duct to suggest choledocholithiasis. Common bile duct measures 3 mm in the porta hepatis. No intrahepatic biliary ductal dilatation noted on MRCP images. Pancreas: In the inferior aspect of the pancreatic head (axial image 27 of series 7) there is a 9 mm T2 hyperintense lesion. In the pancreatic body (axial image 16 of series 7) there is an additional 6 mm T2 hyperintense lesion. No definite solid appearing pancreatic mass noted on today's noncontrast examination. No pancreatic ductal dilatation noted on MRCP images. No peripancreatic fluid collections or inflammatory changes. Spleen:  Unremarkable. Adrenals/Urinary Tract: There are several T1 hypointense and T2 hyperintense lesions in the kidneys, measuring  up to 2.9 cm in diameter in the medial aspect of the upper pole of the right kidney. In addition, in the upper pole of the right kidney laterally there is a 1.3 cm lesion (axial image 32 of series 19) which is mildly T1 hyperintense and T2 hyperintense. No hydroureteronephrosis in the visualized portions of the abdomen. Bilateral adrenal glands are normal in appearance. Stomach/Bowel: Visualized portions are unremarkable. Vascular/Lymphatic: No aneurysm identified in the visualized abdominal vasculature. No lymphadenopathy noted in the abdomen. Other: No significant volume of ascites noted in the visualized portions of the peritoneal cavity. Musculoskeletal: No aggressive appearing osseous lesions are noted in the visualize portions of the skeleton. Signal void noted in the lumbar spine related to indwelling spinal fixation hardware. IMPRESSION: 1. Study is positive for cholelithiasis with imaging findings suggestive of acute cholecystitis. No evidence of choledocholithiasis. No findings of biliary tract obstruction. 2. Subcentimeter cystic lesions in the pancreatic head and body. These are statistically likely to represent focal areas of side branch ectasia or small pancreatic pseudocysts, however, the possibility of side branch IPMN (intraductal papillary mucinous neoplasm is not excluded). Recommend follow up pre and post contrast MRI/MRCP or pancreatic protocol CT in 2 years. This recommendation follows ACR consensus guidelines: Management of Incidental Pancreatic Cysts: A White Paper of the ACR Incidental Findings Committee. J Am Coll Radiol 2951;88:416-606. 3. Multiple renal lesions bilaterally, as above, incompletely characterized on today's noncontrast examination but likely to represent Bosniak class 1 and Bosniak class 2 cysts. Attention at time of repeat abdominal MRI is recommended to ensure stability. 4. Hepatic steatosis. Electronically Signed   By: Vinnie Langton M.D.   On: 04/30/2021 06:55   MR  3D Recon At Scanner  Result Date: 04/30/2021 CLINICAL DATA:  70 year old male with history of right upper quadrant abdominal pain. Ultrasound was indicative of acute cholecystitis. EXAM: MRI ABDOMEN WITHOUT CONTRAST  (INCLUDING MRCP) TECHNIQUE: Multiplanar multisequence MR imaging of the abdomen was performed. Heavily T2-weighted images of the biliary and pancreatic ducts were obtained, and three-dimensional MRCP images were rendered by post processing. COMPARISON:  No prior abdominal MRI. Abdominal ultrasound 04/30/2021. FINDINGS: Comment: Today's study is limited for detection and characterization of visceral and/or vascular lesions by lack of IV gadolinium. Lower chest: Unremarkable. Hepatobiliary: Diffuse loss of signal intensity throughout the hepatic parenchyma on out of phase dual echo images, indicative of a background of hepatic steatosis. No suspicious cystic or solid hepatic lesions are confidently identified on today's noncontrast examination. Tiny filling defects are present within the lumen of the gallbladder, compatible with small gallstones. Gallbladder is moderately distended. Gallbladder wall appears thickened and edematous measuring up to 11 mm in thickness with trace volume of pericholecystic fluid.  There are no filling defects within the common bile duct to suggest choledocholithiasis. Common bile duct measures 3 mm in the porta hepatis. No intrahepatic biliary ductal dilatation noted on MRCP images. Pancreas: In the inferior aspect of the pancreatic head (axial image 27 of series 7) there is a 9 mm T2 hyperintense lesion. In the pancreatic body (axial image 16 of series 7) there is an additional 6 mm T2 hyperintense lesion. No definite solid appearing pancreatic mass noted on today's noncontrast examination. No pancreatic ductal dilatation noted on MRCP images. No peripancreatic fluid collections or inflammatory changes. Spleen:  Unremarkable. Adrenals/Urinary Tract: There are several T1  hypointense and T2 hyperintense lesions in the kidneys, measuring up to 2.9 cm in diameter in the medial aspect of the upper pole of the right kidney. In addition, in the upper pole of the right kidney laterally there is a 1.3 cm lesion (axial image 32 of series 19) which is mildly T1 hyperintense and T2 hyperintense. No hydroureteronephrosis in the visualized portions of the abdomen. Bilateral adrenal glands are normal in appearance. Stomach/Bowel: Visualized portions are unremarkable. Vascular/Lymphatic: No aneurysm identified in the visualized abdominal vasculature. No lymphadenopathy noted in the abdomen. Other: No significant volume of ascites noted in the visualized portions of the peritoneal cavity. Musculoskeletal: No aggressive appearing osseous lesions are noted in the visualize portions of the skeleton. Signal void noted in the lumbar spine related to indwelling spinal fixation hardware. IMPRESSION: 1. Study is positive for cholelithiasis with imaging findings suggestive of acute cholecystitis. No evidence of choledocholithiasis. No findings of biliary tract obstruction. 2. Subcentimeter cystic lesions in the pancreatic head and body. These are statistically likely to represent focal areas of side branch ectasia or small pancreatic pseudocysts, however, the possibility of side branch IPMN (intraductal papillary mucinous neoplasm is not excluded). Recommend follow up pre and post contrast MRI/MRCP or pancreatic protocol CT in 2 years. This recommendation follows ACR consensus guidelines: Management of Incidental Pancreatic Cysts: A White Paper of the ACR Incidental Findings Committee. J Am Coll Radiol 7017;79:390-300. 3. Multiple renal lesions bilaterally, as above, incompletely characterized on today's noncontrast examination but likely to represent Bosniak class 1 and Bosniak class 2 cysts. Attention at time of repeat abdominal MRI is recommended to ensure stability. 4. Hepatic steatosis. Electronically  Signed   By: Vinnie Langton M.D.   On: 04/30/2021 06:55   US Abdomen Limited RUQ (LIVER/GB)  Result Date: 04/30/2021 CLINICAL DATA:  Elevated liver function studies. Elevated bilirubin. Right upper quadrant pain for 4 days. EXAM: ULTRASOUND ABDOMEN LIMITED RIGHT UPPER QUADRANT COMPARISON:  01/27/2018 FINDINGS: Gallbladder: Cholelithiasis with sludge and small stones in the gallbladder. Largest stone measures about 4 mm. There is evidence of mild gallbladder wall thickening to 4 mm with pericholecystic edema. Murphy's sign is positive. Common bile duct: Diameter: 4 mm, normal Liver: Diffusely increased hepatic parenchymal echotexture consistent with fatty infiltration. No focal lesions identified. Portal vein is patent on color Doppler imaging with normal direction of blood flow towards the liver. Other: None. IMPRESSION: Cholelithiasis, gallbladder wall thickening, and edema with positive Murphy's sign. Findings are compatible with acute cholecystitis. Electronically Signed   By: Lucienne Capers M.D.   On: 04/30/2021 02:38      Labs: BNP (last 3 results) No results for input(s): BNP in the last 8760 hours. Basic Metabolic Panel: Recent Labs  Lab 04/30/21 0038 05/01/21 0444 05/02/21 0621  NA 133* 137 135  K 4.7 4.3 3.9  CL 102 105 106  CO2 20*  24 24  GLUCOSE 163* 144* 124*  BUN 18 21 17   CREATININE 1.03 1.14 1.14  CALCIUM 8.3* 7.8* 7.7*  MG  --  2.3 2.3   Liver Function Tests: Recent Labs  Lab 04/30/21 0038 04/30/21 0847 05/01/21 0444 05/02/21 0621  AST 111* 77* 149* 94*  ALT 147* 136* 212* 170*  ALKPHOS 121 121 121 87  BILITOT 3.9* 1.7* 1.4* 1.1  PROT 6.8 6.6 6.2* 6.2*  ALBUMIN 3.6 3.4* 3.0* 2.8*   Recent Labs  Lab 04/30/21 0410 04/30/21 0847 05/01/21 0444  LIPASE 257* 199* 66*   No results for input(s): AMMONIA in the last 168 hours. CBC: Recent Labs  Lab 04/30/21 0038 05/01/21 0444 05/02/21 0621  WBC 10.8* 10.0 9.4  HGB 14.1 13.2 12.8*  HCT 40.9 38.4*  36.9*  MCV 88.7 89.7 88.9  PLT 218 245 227   Cardiac Enzymes: No results for input(s): CKTOTAL, CKMB, CKMBINDEX, TROPONINI in the last 168 hours. BNP: Invalid input(s): POCBNP CBG: Recent Labs  Lab 04/30/21 0756 04/30/21 1203 04/30/21 2117 05/01/21 2110 05/02/21 0818  GLUCAP 121* 108* 195* 147* 134*   D-Dimer No results for input(s): DDIMER in the last 72 hours. Hgb A1c No results for input(s): HGBA1C in the last 72 hours. Lipid Profile No results for input(s): CHOL, HDL, LDLCALC, TRIG, CHOLHDL, LDLDIRECT in the last 72 hours. Thyroid function studies No results for input(s): TSH, T4TOTAL, T3FREE, THYROIDAB in the last 72 hours.  Invalid input(s): FREET3 Anemia work up No results for input(s): VITAMINB12, FOLATE, FERRITIN, TIBC, IRON, RETICCTPCT in the last 72 hours. Urinalysis    Component Value Date/Time   COLORURINE YELLOW (A) 04/30/2021 0039   APPEARANCEUR CLEAR (A) 04/30/2021 0039   APPEARANCEUR Clear 03/11/2021 0806   LABSPEC 1.009 04/30/2021 0039   PHURINE 6.0 04/30/2021 0039   GLUCOSEU NEGATIVE 04/30/2021 0039   HGBUR NEGATIVE 04/30/2021 0039   BILIRUBINUR NEGATIVE 04/30/2021 0039   BILIRUBINUR Negative 03/11/2021 0806   KETONESUR NEGATIVE 04/30/2021 0039   PROTEINUR NEGATIVE 04/30/2021 0039   UROBILINOGEN 0.2 05/26/2017 1427   NITRITE NEGATIVE 04/30/2021 0039   LEUKOCYTESUR NEGATIVE 04/30/2021 0039   Sepsis Labs Invalid input(s): PROCALCITONIN,  WBC,  LACTICIDVEN Microbiology Recent Results (from the past 240 hour(s))  Resp Panel by RT-PCR (Flu A&B, Covid) Nasopharyngeal Swab     Status: None   Collection Time: 04/30/21  3:14 AM   Specimen: Nasopharyngeal Swab; Nasopharyngeal(NP) swabs in vial transport medium  Result Value Ref Range Status   SARS Coronavirus 2 by RT PCR NEGATIVE NEGATIVE Final    Comment: (NOTE) SARS-CoV-2 target nucleic acids are NOT DETECTED.  The SARS-CoV-2 RNA is generally detectable in upper respiratory specimens during the  acute phase of infection. The lowest concentration of SARS-CoV-2 viral copies this assay can detect is 138 copies/mL. A negative result does not preclude SARS-Cov-2 infection and should not be used as the sole basis for treatment or other patient management decisions. A negative result may occur with  improper specimen collection/handling, submission of specimen other than nasopharyngeal swab, presence of viral mutation(s) within the areas targeted by this assay, and inadequate number of viral copies(<138 copies/mL). A negative result must be combined with clinical observations, patient history, and epidemiological information. The expected result is Negative.  Fact Sheet for Patients:  EntrepreneurPulse.com.au  Fact Sheet for Healthcare Providers:  IncredibleEmployment.be  This test is no t yet approved or cleared by the [place] FDA and  has been authorized for detection and/or diagnosis of SARS-CoV-2 by FDA  under an Emergency Use Authorization (EUA). This EUA will remain  in effect (meaning this test can be used) for the duration of the COVID-19 declaration under Section 564(b)(1) of the Act, 21 U.S.C.section 360bbb-3(b)(1), unless the authorization is terminated  or revoked sooner.       Influenza A by PCR NEGATIVE NEGATIVE Final   Influenza B by PCR NEGATIVE NEGATIVE Final    Comment: (NOTE) The Xpert Xpress SARS-CoV-2/FLU/RSV plus assay is intended as an aid in the diagnosis of influenza from Nasopharyngeal swab specimens and should not be used as a sole basis for treatment. Nasal washings and aspirates are unacceptable for Xpert Xpress SARS-CoV-2/FLU/RSV testing.  Fact Sheet for Patients: EntrepreneurPulse.com.au  Fact Sheet for Healthcare Providers: IncredibleEmployment.be  This test is not yet approved or cleared by the [place] FDA and has been authorized for detection and/or  diagnosis of SARS-CoV-2 by FDA under an Emergency Use Authorization (EUA). This EUA will remain in effect (meaning this test can be used) for the duration of the COVID-19 declaration under Section 564(b)(1) of the Act, 21 U.S.C. section 360bbb-3(b)(1), unless the authorization is terminated or revoked.  Performed at [facility], [address] [place], [place] [address]   Surgical PCR screen     Status: Abnormal   Collection Time: 04/30/21  1:42 PM   Specimen: Nasal Mucosa; Nasal Swab  Result Value Ref Range Status   MRSA, PCR NEGATIVE NEGATIVE Final   Staphylococcus aureus POSITIVE (A) NEGATIVE Final    Comment: (NOTE) The Xpert SA Assay (FDA approved for NASAL specimens in patients 69 years of age and older), is one component of a comprehensive surveillance program. It is not intended to diagnose infection nor to guide or monitor treatment. Performed at [facility], [address] [place], [place] [address]      Total time spend on discharging this patient, including the last patient exam, discussing the hospital stay, instructions for ongoing care as it relates to all pertinent caregivers, as well as preparing the medical discharge records, prescriptions, and/or referrals as applicable, is 35 minutes.    Enzo Bi, MD  [facility] 05/02/2021, 9:18 AM

## 2021-05-02 NOTE — Progress Notes (Signed)
[name] Hospital Day(s): 2.   Post op day(s): 2 Days Post-Op.   Interval History:  Patient seen and examined No acute events or new complaints overnight.  Patient reports he overall feels well; abdominal soreness No fever, chills, nausea, emesis Leukocytosis remains resolved; WBC 9.4K this morning Renal function remains normal at his baseline, sCr - 1.14; UO - 755 ccs + unmeasured No significant electrolyte derangements Hyperbilirubinemia improved to 1.1 Surgical drain with 75 ccs out overnight; serous Tolerating regular diet Continues on Zosyn  Vital signs in last 24 hours: [min-max] current  Temp:  [97.6 F (36.4 C)-99.3 F (37.4 C)] 97.6 F (36.4 C) (07/07 2027) Pulse Rate:  [59-72] 63 (07/08 0358) Resp:  [16-20] 16 (07/08 0358) BP: (114-130)/(63-71) 114/63 (07/08 0358) SpO2:  [94 %-97 %] 94 % (07/08 0358)     Height: 6\' 1"  (185.4 cm) Weight: 104.8 kg BMI (Calculated): 30.48   Intake/Output last 2 shifts:  07/07 0701 - 07/08 0700 In: 1114.9 [P.O.:960; IV Piggyback:154.9] Out: 830 [Urine:755; Drains:75]   Physical Exam:  Constitutional: alert, cooperative and no distress  Respiratory: breathing non-labored at rest  Cardiovascular: regular rate and sinus rhythm  Gastrointestinal: soft, non-tender, and non-distended, no rebound/guarding, surgical drain in RLQ with serous output  Integumentary: Laparoscopic incisions are CDI with dermabond, no erythema or drainage   Labs:  CBC Latest Ref Rng & Units 05/02/2021 05/01/2021 04/30/2021  WBC 4.0 - 10.5 K/uL 9.4 10.0 10.8(H)  Hemoglobin 13.0 - 17.0 g/dL 12.8(L) 13.2 14.1  Hematocrit 39.0 - 52.0 % 36.9(L) 38.4(L) 40.9  Platelets 150 - 400 K/uL 227 245 218   CMP Latest Ref Rng & Units 05/02/2021 05/01/2021 04/30/2021  Glucose 70 - 99 mg/dL 124(H) 144(H) -  BUN 8 - 23 mg/dL 17 21 -  Creatinine 0.61 - 1.24 mg/dL 1.14 1.14 -  Sodium 135 - 145 mmol/L 135 137 -  Potassium 3.5 - 5.1 mmol/L 3.9  4.3 -  Chloride 98 - 111 mmol/L 106 105 -  CO2 22 - 32 mmol/L 24 24 -  Calcium 8.9 - 10.3 mg/dL 7.7(L) 7.8(L) -  Total Protein 6.5 - 8.1 g/dL 6.2(L) 6.2(L) 6.6  Total Bilirubin 0.3 - 1.2 mg/dL 1.1 1.4(H) 1.7(H)  Alkaline Phos 38 - 126 U/L 87 121 121  AST 15 - 41 U/L 94(H) 149(H) 77(H)  ALT 0 - 44 U/L 170(H) 212(H) 136(H)    Imaging studies: No new pertinent imaging studies   Assessment/Plan: 70 y.o. male  2 Days Post-Op s/p robotic assisted laparoscopic cholecystectomy for acute cholecystitis and gallstone pancreatitis, likely passed stone   - Continue regular diet; will provide post-op dietary recommendations - Continue IV Abx (Zosyn); transition to PO for home  - Monitor abdominal examination - Monitor leukocytosis --> resolved - Monitor lipase level; hyperbilirubinemia  --> resolved   - Mobilization as tolerated  - Further Management per primary service   - Discharge Planning: Okay for discharge home; updated post-operative instructions/recommendations, will send home with Augmentin x7 days, he will go home with drain and handouts provided, follow up on Wednesday   All of the above findings and recommendations were discussed with the patient, patient's family (wife at bedside), and the medical team, and all of patient's and family's questions were answered to their expressed satisfaction.  -- Edison Simon, PA-C [facility] 05/02/2021, 7:19 AM [phone] M-F: 7am - 4pm

## 2021-05-02 NOTE — Progress Notes (Signed)
Iv removed from patient. Discharge instructions given to patient. Verbalized understanding. No acute distress at this time. Wife to transport patient home.

## 2021-05-05 ENCOUNTER — Telehealth: Payer: Self-pay | Admitting: Gastroenterology

## 2021-05-05 NOTE — Telephone Encounter (Signed)
-----   Message from Virgel Manifold, MD sent at 05/02/2021 12:42 PM EDT -----  Please set up clinic appointment with me in 6 weeks for hospital follow up of elevated liver enzymes

## 2021-05-05 NOTE — Telephone Encounter (Signed)
LVM for patient to call our office to schedule a 6 wk follow-up with Dr. Bonna Gains.

## 2021-05-07 ENCOUNTER — Ambulatory Visit (INDEPENDENT_AMBULATORY_CARE_PROVIDER_SITE_OTHER): Payer: Medicare Other | Admitting: Surgery

## 2021-05-07 ENCOUNTER — Telehealth: Payer: Self-pay | Admitting: *Deleted

## 2021-05-07 ENCOUNTER — Other Ambulatory Visit: Payer: Self-pay

## 2021-05-07 ENCOUNTER — Encounter: Payer: Self-pay | Admitting: Surgery

## 2021-05-07 VITALS — BP 133/88 | HR 64 | Temp 98.2°F | Resp 14 | Ht 73.0 in | Wt 227.0 lb

## 2021-05-07 DIAGNOSIS — Z09 Encounter for follow-up examination after completed treatment for conditions other than malignant neoplasm: Secondary | ICD-10-CM

## 2021-05-07 DIAGNOSIS — K851 Biliary acute pancreatitis without necrosis or infection: Secondary | ICD-10-CM

## 2021-05-07 DIAGNOSIS — K819 Cholecystitis, unspecified: Secondary | ICD-10-CM

## 2021-05-07 NOTE — Telephone Encounter (Signed)
-----   Message from Virgel Manifold, MD sent at 05/02/2021 12:42 PM EDT -----  Please set up clinic appointment with me in 6 weeks for hospital follow up of elevated liver enzymes

## 2021-05-07 NOTE — Anesthesia Postprocedure Evaluation (Signed)
Anesthesia Post Note  Patient: Jackson [name]  Procedure(s) Performed: XI ROBOTIC ASSISTED LAPAROSCOPIC CHOLECYSTECTOMY (Abdomen)  Patient location during evaluation: PACU Anesthesia Type: General Level of consciousness: awake and alert Pain management: pain level controlled Vital Signs Assessment: post-procedure vital signs reviewed and stable Respiratory status: spontaneous breathing, nonlabored ventilation, respiratory function stable and patient connected to nasal cannula oxygen Cardiovascular status: blood pressure returned to baseline and stable Postop Assessment: no apparent nausea or vomiting Anesthetic complications: no   No notable events documented.   Last Vitals:  Vitals:   05/02/21 0358 05/02/21 0819  BP: 114/63 (!) 143/83  Pulse: 63 63  Resp: 16 16  Temp:  36.9 C  SpO2: 94% 98%    Last Pain:  Vitals:   05/02/21 0841  TempSrc:   PainSc: [place]

## 2021-05-07 NOTE — Telephone Encounter (Signed)
Virgel Manifold, MD  Trena Platt G Please set up clinic appointment with me in 6 weeks for hospital follow up ofelevated liver enzymes    LVM to call our office for an appt.

## 2021-05-07 NOTE — Progress Notes (Signed)
05/07/2021  HPI: Jackson [name] is a 70 y.o. male s/p robotic assisted cholecystectomy for acute cholecystitis with mild gallstone pancreatitis on 04/30/21.  His gallbladder was very inflamed and edematous, and a drain was left in place in the RUQ.  He was discharged with course of Augmentin.  Today he reports he's doing well.  Denies any significant pain, nausea, and is tolerating diet ok.  Drain is having about 15 ml per day.  Vital signs: BP 133/88   Pulse 64   Temp 98.2 F (36.8 C) (Oral)   Resp 14   Ht 6\' 1"  (1.854 m)   Wt 227 lb (103 kg)   SpO2 98%   BMI 29.95 kg/m    Physical Exam: Constitutional:  No acute distress Abdomen:  Soft, non-distended, non-tender to palpation.  Incisions healing well, clean, dry, intact.  Drain with serosanguinous fluid.  Removed at bedside without issues.  Dry gauze applied.  Assessment/Plan: [name] s/p robotic assisted cholecystectomy  --Patient is healing remarkably well, without any post op complications or issues.  Drain removed today without issues and dry gauze dressing applied.  Instructed to apply dry gauze dressing until fully healed. --Follow up prn   Melvyn Neth, [place]

## 2021-05-07 NOTE — Patient Instructions (Addendum)
If you have any concerns or questions, please feel free to call our office. Follow up as needed.   GENERAL POST-OPERATIVE PATIENT INSTRUCTIONS   WOUND CARE INSTRUCTIONS:  Keep a dry clean dressing on the wound if there is drainage. The initial bandage may be removed after 24 hours.  Once the wound has quit draining you may leave it open to air.  If clothing rubs against the wound or causes irritation and the wound is not draining you may cover it with a dry dressing during the daytime.  Try to keep the wound dry and avoid ointments on the wound unless directed to do so.  If the wound becomes bright red and painful or starts to drain infected material that is not clear, please contact your physician immediately.  If the wound is mildly pink and has a thick firm ridge underneath it, this is normal, and is referred to as a healing ridge.  This will resolve over the next 4-6 weeks.  BATHING: You may shower if you have been informed of this by your surgeon. However, Please do not submerge in a tub, hot tub, or pool until incisions are completely sealed or have been told by your surgeon that you may do so.  DIET:  You may eat any foods that you can tolerate.  It is a good idea to eat a high fiber diet and take in plenty of fluids to prevent constipation.  If you do become constipated you may want to take a mild laxative or take ducolax tablets on a daily basis until your bowel habits are regular.  Constipation can be very uncomfortable, along with straining, after recent surgery.  ACTIVITY:  You are encouraged to cough and deep breath or use your incentive spirometer if you were given one, every 15-30 minutes when awake.  This will help prevent respiratory complications and low grade fevers post-operatively if you had a general anesthetic.  You may want to hug a pillow when coughing and sneezing to add additional support to the surgical area, if you had abdominal or chest surgery, which will decrease pain  during these times.  You are encouraged to walk and engage in light activity for the next two weeks.  You should not lift more than 15 pounds, until  05/28/2021 as it could put you at increased risk for complications.  Twenty pounds is roughly equivalent to a plastic bag of groceries. At that time- Listen to your body when lifting, if you have pain when lifting, stop and then try again in a few days. Soreness after doing exercises or activities of daily living is normal as you get back in to your normal routine.  MEDICATIONS:  Try to take narcotic medications and anti-inflammatory medications, such as tylenol, ibuprofen, naprosyn, etc., with food.  This will minimize stomach upset from the medication.  Should you develop nausea and vomiting from the pain medication, or develop a rash, please discontinue the medication and contact your physician.  You should not drive, make important decisions, or operate machinery when taking narcotic pain medication.   Gallbladder Eating Plan  If you have a gallbladder condition, you may have trouble digesting fats. Eating a low-fat diet can help reduce your symptoms, and may be helpful before and after having surgery to remove your gallbladder (cholecystectomy). Your health care provider may recommend that you work with a diet and nutrition specialist (dietitian) to help you reduce the amount of fat in your diet. What are tips for following this  plan? General guidelines Limit your fat intake to less than 30% of your total daily calories. If you eat around 1,800 calories each day, this is less than 60 grams (g) of fat per day. Fat is an important part of a healthy diet. Eating a low-fat diet can make it hard to maintain a healthy body weight. Ask your dietitian how much fat, calories, and other nutrients you need each day. Eat small, frequent meals throughout the day instead of three large meals. Drink at least 8-10 cups of fluid a day. Drink enough fluid to keep your  urine clear or pale yellow. Limit alcohol intake to no more than 1 drink a day for nonpregnant women and 2 drinks a day for men. One drink equals 12 oz of beer, 5 oz of wine, or 1 oz of hard liquor. Reading food labels  Check Nutrition Facts on food labels for the amount of fat per serving. Choose foods with less than 3 grams of fat per serving.  Shopping Choose nonfat and low-fat healthy foods. Look for the words "nonfat," "low fat," or "fat free." Avoid buying processed or prepackaged foods. Cooking Cook using low-fat methods, such as baking, broiling, grilling, or boiling. Cook with small amounts of healthy fats, such as olive oil, grapeseed oil, canola oil, or sunflower oil. What foods are recommended? All fresh, frozen, or canned fruits and vegetables. Whole grains. Low-fat or non-fat (skim) milk and yogurt. Lean meat, skinless poultry, fish, eggs, and beans. Low-fat protein supplement powders or drinks. Spices and herbs. What foods are not recommended? High-fat foods. These include baked goods, fast food, fatty cuts of meat, ice cream, french toast, sweet rolls, pizza, cheese bread, foods covered with butter, creamy sauces, or cheese. Fried foods. These include french fries, tempura, battered fish, breaded chicken, fried breads, and sweets. Foods with strong odors. Foods that cause bloating and gas. Summary A low-fat diet can be helpful if you have a gallbladder condition, or before and after gallbladder surgery. Limit your fat intake to less than 30% of your total daily calories. This is about 60 g of fat if you eat 1,800 calories each day. Eat small, frequent meals throughout the day instead of three large meals. This information is not intended to replace advice given to you by your health care provider. Make sure you discuss any questions you have with your healthcare provider. Document Revised: 05/30/2020 Document Reviewed: 05/30/2020 Elsevier Patient Education  2022  [place] Use sun block to incision area over the next year if this area will be exposed to sun. This helps decrease scarring and will allow you avoid a permanent darkened area over your incision.

## 2021-05-09 ENCOUNTER — Ambulatory Visit (INDEPENDENT_AMBULATORY_CARE_PROVIDER_SITE_OTHER): Payer: Medicare Other | Admitting: Pharmacist

## 2021-05-09 DIAGNOSIS — N400 Enlarged prostate without lower urinary tract symptoms: Secondary | ICD-10-CM

## 2021-05-09 DIAGNOSIS — T466X5A Adverse effect of antihyperlipidemic and antiarteriosclerotic drugs, initial encounter: Secondary | ICD-10-CM

## 2021-05-09 DIAGNOSIS — E119 Type 2 diabetes mellitus without complications: Secondary | ICD-10-CM

## 2021-05-09 DIAGNOSIS — E785 Hyperlipidemia, unspecified: Secondary | ICD-10-CM

## 2021-05-09 DIAGNOSIS — M791 Myalgia, unspecified site: Secondary | ICD-10-CM

## 2021-05-09 DIAGNOSIS — E1142 Type 2 diabetes mellitus with diabetic polyneuropathy: Secondary | ICD-10-CM | POA: Diagnosis not present

## 2021-05-09 NOTE — Chronic Care Management (AMB) (Signed)
Care Management   Pharmacy Note  05/09/2021 Name: Jackson [name] MRN: [id number] DOB: 20-Jul-1951  Subjective: Jackson [name] is a 70 y.o. year old male who is a primary care patient of [name], [name], MD. The Care Management team was consulted for assistance with care management and care coordination needs.    Engaged with patient by telephone for follow up visit in response to provider referral for pharmacy case management and/or care coordination services.   The patient was given information about Care Management services today including:  Care Management services includes personalized support from designated clinical staff supervised by the patient's primary care provider, including individualized plan of care and coordination with other care providers. 24/7 contact phone numbers for assistance for urgent and routine care needs. The patient may stop case management services at any time by phone call to the office staff.  Patient agreed to services and consent obtained.  Assessment:  Review of patient status, including review of consultants reports, laboratory and other test data, was performed as part of comprehensive evaluation and provision of chronic care management services.   SDOH (Social Determinants of Health) assessments and interventions performed:  SDOH Interventions    Flowsheet Row Most Recent Value  SDOH Interventions   Financial Strain Interventions Intervention Not Indicated        Objective:  Lab Results  Component Value Date   CREATININE 1.14 05/02/2021   CREATININE 1.14 05/01/2021   CREATININE 1.03 04/30/2021    Lab Results  Component Value Date   HGBA1C 6.8 (H) 03/11/2021       Component Value Date/Time   CHOL 215 (H) 03/11/2021 0806   TRIG 191.0 (H) 03/11/2021 0806   HDL 37.70 (L) 03/11/2021 0806   CHOLHDL 6 03/11/2021 0806   VLDL 38.2 03/11/2021 0806   LDLCALC 139 (H) 03/11/2021 0806   LDLDIRECT 155.0 07/19/2020 0803      Clinical ASCVD: No  The 10-year ASCVD risk score Mikey Bussing DC Jr., et al., 2013) is: 39.7%   Values used to calculate the score:     Age: 57 years     Sex: Male     Is Non-Hispanic African American: No     Diabetic: Yes     Tobacco smoker: No     Systolic Blood Pressure: 606 mmHg     Is BP treated: Yes     HDL Cholesterol: 37.7 mg/dL     Total Cholesterol: 215 mg/dL      BP Readings from Last 3 Encounters:  05/07/21 133/88  05/02/21 (!) 143/83  03/28/21 (!) 132/118    Care Plan  Allergies  Allergen Reactions   Januvia [Sitagliptin]     Kidney pain    Lipitor [Atorvastatin]     10 mg ?myalgias   Statins Other (See Comments)    Myalgias; atorvastatin 10 mg, pravastatin 10 mg, rosuvastatin w/ unknown dose.     Medications Reviewed Today     Reviewed by De Hollingshead, RPH-CPP (Pharmacist) on 05/09/21 at 1336  Med List Status: <None>   Medication Order Taking? Sig Documenting Provider Last Dose Status Informant  amoxicillin-clavulanate (AUGMENTIN) 875-125 MG tablet [id number] Yes Take 1 tablet by mouth 2 (two) times daily for 7 days. Tylene Fantasia, PA-C Taking Active   azelastine (ASTELIN) 0.1 % nasal spray [id number] Yes Place 1 spray into both nostrils 2 (two) times daily as needed for rhinitis or allergies. Use in each nostril as directed Enzo Bi, MD Taking Active   cetirizine (ZYRTEC)  10 MG tablet [id number] Yes Take 10 mg by mouth at bedtime.  [provider] Taking Active Other  cholecalciferol (VITAMIN D3) 25 MCG (1000 UT) tablet [id number] Yes Take 1,000 Units by mouth daily. [provider] Taking Active Other  Coenzyme Q10 (COQ10) 200 MG CAPS [id number] Yes Take 200 mg by mouth daily.  [provider] Taking Active Other  Continuous Blood Gluc Sensor (FREESTYLE LIBRE 2 SENSOR) [place] [id number]  Use to check glucose Q8H [name], [name], MD  Active Other  Dulaglutide (TRULICITY) 4.56 YB/6.3SL SOPN [id number] Yes Inject 0.75  mg into the skin every Tuesday. Enzo Bi, MD Taking Active   ibuprofen (ADVIL) 600 MG tablet [id number] Yes Take 1 tablet (600 mg total) by mouth every 6 (six) hours as needed. Tylene Fantasia, PA-C Taking Active   metFORMIN (GLUCOPHAGE) 1000 MG tablet [id number] Yes TAKE 1 TABLET TWICE A DAY WITH MEALS [name], [name], MD Taking Active Self  metoprolol succinate (TOPROL-XL) 50 MG 24 hr tablet [id number] Yes TAKE 1 TABLET DAILY WITH OR IMMEDIATELY FOLLOWING A MEAL (DOSE CHANGE) [name], [name], MD Taking Active Self  Multiple Vitamins-Minerals (MULTIVITAMIN ADULT PO) [id number] Yes Take 1 tablet by mouth daily.  [provider] Taking Active Other           Med Note [facility] Graylin Shiver   Tue Jan 02, 2020  3:33 PM)    Omega-3 Fatty Acids (FISH OIL) 1200 MG CPDR [id number] Yes Take 1 tablet by mouth daily. [provider] Taking Active Other  omeprazole (PRILOSEC) 40 MG capsule [id number] Yes Take 1 capsule (40 mg total) by mouth at bedtime. Enzo Bi, MD Taking Active   Red Yeast Rice 600 MG TABS [id number] No Take 600 mg by mouth 2 (two) times daily with a meal.  Patient not taking: Reported on 05/09/2021   [provider] Not Taking Active Other  tamsulosin (FLOMAX) 0.4 MG CAPS capsule [id number] Yes Take 1 capsule (0.4 mg total) by mouth at bedtime. Enzo Bi, MD Taking Active   triamcinolone ointment (KENALOG) 0.1 % [id number] No Apply 1 application topically daily. As needed and cover with bandaid. Avoid face, groin, underarms.  Patient not taking: Reported on 05/09/2021   Alfonso Patten, MD Not Taking Active   vitamin C (ASCORBIC ACID) 500 MG tablet [id number] Yes Take 1,000 mg by mouth daily.  [provider] Taking Active Other  Vitamin E 400 units TABS [id number] Yes Take 400 Units by mouth daily.  [provider] Taking Active Other            Patient Active Problem List   Diagnosis Date Noted   Gallstone pancreatitis  04/30/2021   Cholecystitis    Abnormal MRI, lumbar spine 02/17/2021   Iron deficiency 12/30/2020   Statin intolerance 08/23/2020   Hypertension associated with diabetes ([place]) 07/24/2020   Statin myopathy 03/20/2020   Type 2 diabetes mellitus with hyperglycemia, without long-term current use of insulin ([place]) 12/19/2019   Elevated PSA 06/13/2019   Iron deficiency anemia 06/13/2019   Basal cell carcinoma of skin 03/02/2019   Melanoma of skin ([place]) 02/21/2019   AK (actinic keratosis) 11/23/2018   Vertigo 11/23/2018   Allergic rhinitis 07/04/2018   Fatty liver 03/03/2018   Bradycardia 03/03/2018   Fatigue 01/23/2018   Benign prostatic hyperplasia without lower urinary tract symptoms 01/23/2018   Type 2 diabetes mellitus without complication, without long-term current use of insulin ([place]) 01/23/2018   Thyroid nodule 01/20/2018   Hyperlipidemia 11/26/2016  DM type 2 with diabetic peripheral neuropathy ([place]) 05/27/2016   Paroxysmal SVT (supraventricular tachycardia) ([place]) 05/27/2016   GERD (gastroesophageal reflux disease) 05/27/2016   OSA (obstructive sleep apnea) 05/27/2016    Conditions to be addressed/monitored: HTN, HLD, and DMII  Care Plan : Medication Management  Updates made by De Hollingshead, RPH-CPP since 05/09/2021 12:00 AM  Completed 05/09/2021   Problem: T2DM, HTN, HLD, OSA Resolved 05/09/2021     Long-Range Goal: Disease Progression Prevention Completed 05/09/2021  Start Date: 03/31/2021  This Visit's Progress: On track  Recent Progress: On track  Priority: High  Note:   Pharmacist Clinical Goal(s):  Over the next 90 days, patient will verbalize ability to afford treatment regimen through collaboration with PharmD and provider.   Interventions: 1:1 collaboration with [name], [name], MD regarding development and update of comprehensive plan of care as evidenced by provider attestation and co-signature Inter-disciplinary care team collaboration (see  longitudinal plan of care) Comprehensive medication review performed; medication list updated in electronic medical record  Health Maintenance: Reports he is recovering well from cholecystectomy. S/p f/u with surgeon.   Diabetes: Controlled; current treatment: Trulicity 1.61 mg weekly, metformin 1000 mg BID Current glucose readings: reports all well controlled Discussed Libre. Reports that he had just placed one prior to MRI, so it was immediately removed. Encouraged to ask for sample next week when he is in here seeing PCP. Insurance does not cover, so if he wants to use moving forward he would need to pay cash price.  Discussed association between GLP1 agents and pancreatitis. He notes that he asked both his hospitalist and the surgeon about this. Per his report and my chart review, appears to have been diagnosed as gallstone pancreatitis. As gallbladder is now removed, unlikely for any future concerns with pancreatitis. I believe it is appropriate to continue GLP1 therapy at this time.  Recommended to continue current regimen at this time  Hypertension: Controlled per last office visit; current treatment: metoprolol succinate 50 mg daily;  Previously recommended to continue current regimen  Hyperlipidemia: Uncontrolled; current treatment: Omega-3 Fatty Acid 1200 mg daily, red yeast rice 600 mg BID;  Per cardiology visit 6/3: No heart disease and no strong fhx of heart disease and believes pt not a candidate for PCSK9-inhibitor or Nexletol/Nexlizet. Dr. Debara Pickett recommended adding OTC red yeast rice and continuing OTC fish will in addition to lifestyle modifications. Medications previously tried: atorvastatin 10 mg, pravastatin 10 mg, and rosuvastatin w/ unknown dose (mylagia); ezetimibe (no significant lipid lowering) Recommended to continue current regimen in collaboration with cardiology  Benign Prostate Hyperplasia Controlled; current treatment: tamsulosin 0.4 mg daily Recommended to  continue current regimen along with collaboration with urology  GERD Controlled per symptom report; current treatment: omeprazole 40 mg daily Recommended to continue current regimen  Allergic Rhinitis Controlled per patient report; current treatment: cetirizine 10 mg daily Recommended to continue current regimen  Supplementation Current treatment: Vitamin D, vitamin E, vitamin C, Coenzyme Q10, multivitamin, Osteo Biflex, fish oil, red yeast rice  Patient Goals/Self-Care Activities Over the next 90 days, patient will:  - take medications as prescribed check glucose daily, document, and provide at future appointments  Follow Up Plan: Goals of care met. Patient has my contact information for future questions or concerns      Medication Assistance:  None required.  Patient affirms current coverage meets needs.  Follow Up:  Patient requests no follow-up at this time.  Plan: Goals of care met. Closing CCM case. Patient has my  contact information for future questions or concerns.  Catie Darnelle Maffucci, PharmD, [place], [place] Clinical Pharmacist [employer] [facility] [place]

## 2021-05-09 NOTE — Patient Instructions (Addendum)
Visit Information  PATIENT GOALS:  Goals Addressed               This Visit's Progress     Patient Stated     COMPLETED: Medication Management (pt-stated)        Patient Goals/Self-Care Activities Over the next 90 days, patient will:  - take medications as prescribed check glucose daily, document, and provide at future appointments         Patient verbalizes understanding of instructions provided today and agrees to view in [place].   Plan: Goals of care met. Closing CCM case. Patient has my contact information for future questions or concerns.   Catie Darnelle Maffucci, PharmD, [place], [place] Clinical Pharmacist [employer] [facility] [place]

## 2021-05-12 NOTE — Progress Notes (Signed)
Encounter details: CCM Time Spent       Value Time User   Time spent with patient (minutes)  22 05/09/2021  1:53 PM De Hollingshead, RPH-CPP   Time spent performing Chart review  4 05/09/2021  1:53 PM De Hollingshead, RPH-CPP   Total time (minutes)  26 05/09/2021  1:53 PM De Hollingshead, RPH-CPP      Moderate to High Complex Decision Making       Value Time User   Moderate to High complex decision making  No 05/09/2021  1:53 PM De Hollingshead, RPH-CPP      CCM Services: n/a for 05/09/21 note    Prior to outreach and patient consent for Chronic Care Management, I referred this patient for services after reviewing the nominated patient list or from a personal encounter with the patient.  I have personally reviewed this encounter including the documentation in this note and have collaborated with the care management provider regarding care management and care coordination activities to include development and update of the comprehensive care plan. I am certifying that I agree with the content of this note and encounter as supervising physician.

## 2021-05-13 ENCOUNTER — Encounter: Payer: Self-pay | Admitting: Internal Medicine

## 2021-05-13 ENCOUNTER — Other Ambulatory Visit: Payer: Self-pay

## 2021-05-13 ENCOUNTER — Ambulatory Visit (INDEPENDENT_AMBULATORY_CARE_PROVIDER_SITE_OTHER): Payer: Medicare Other | Admitting: Internal Medicine

## 2021-05-13 VITALS — BP 114/68 | HR 72 | Temp 97.9°F | Ht 73.0 in | Wt 226.6 lb

## 2021-05-13 DIAGNOSIS — K862 Cyst of pancreas: Secondary | ICD-10-CM

## 2021-05-13 DIAGNOSIS — R748 Abnormal levels of other serum enzymes: Secondary | ICD-10-CM

## 2021-05-13 DIAGNOSIS — K851 Biliary acute pancreatitis without necrosis or infection: Secondary | ICD-10-CM | POA: Diagnosis not present

## 2021-05-13 DIAGNOSIS — D649 Anemia, unspecified: Secondary | ICD-10-CM | POA: Diagnosis not present

## 2021-05-13 DIAGNOSIS — T148XXA Other injury of unspecified body region, initial encounter: Secondary | ICD-10-CM | POA: Diagnosis not present

## 2021-05-13 DIAGNOSIS — D490 Neoplasm of unspecified behavior of digestive system: Secondary | ICD-10-CM

## 2021-05-13 DIAGNOSIS — N281 Cyst of kidney, acquired: Secondary | ICD-10-CM

## 2021-05-13 LAB — COMPREHENSIVE METABOLIC PANEL
ALT: 50 U/L (ref 0–53)
AST: 22 U/L (ref 0–37)
Albumin: 4.4 g/dL (ref 3.5–5.2)
Alkaline Phosphatase: 61 U/L (ref 39–117)
BUN: 17 mg/dL (ref 6–23)
CO2: 27 mEq/L (ref 19–32)
Calcium: 9.5 mg/dL (ref 8.4–10.5)
Chloride: 101 mEq/L (ref 96–112)
Creatinine, Ser: 1.13 mg/dL (ref 0.40–1.50)
GFR: 66.67 mL/min (ref >60.00)
Glucose, Bld: 142 mg/dL — ABNORMAL HIGH (ref 70–99)
Potassium: 4.4 mEq/L (ref 3.5–5.1)
Sodium: 137 mEq/L (ref 135–145)
Total Bilirubin: 0.6 mg/dL (ref 0.2–1.2)
Total Protein: 6.7 g/dL (ref 6.0–8.3)

## 2021-05-13 LAB — CBC WITH DIFFERENTIAL/PLATELET
Basophils Absolute: 0.1 10*3/uL (ref 0.0–0.1)
Basophils Relative: 0.6 % (ref 0.0–3.0)
Eosinophils Absolute: 0.2 10*3/uL (ref 0.0–0.7)
Eosinophils Relative: 1.9 % (ref 0.0–5.0)
HCT: 44.1 % (ref 39.0–52.0)
Hemoglobin: 14.9 g/dL (ref 13.0–17.0)
Lymphocytes Relative: 24.8 % (ref 12.0–46.0)
Lymphs Abs: 2.1 10*3/uL (ref 0.7–4.0)
MCHC: 33.9 g/dL (ref 30.0–36.0)
MCV: 89.3 fl (ref 78.0–100.0)
Monocytes Absolute: 0.5 10*3/uL (ref 0.1–1.0)
Monocytes Relative: 6 % (ref 3.0–12.0)
Neutro Abs: 5.5 10*3/uL (ref 1.4–7.7)
Neutrophils Relative %: 66.7 % (ref 43.0–77.0)
Platelets: 338 10*3/uL (ref 150.0–400.0)
RBC: 4.94 Mil/uL (ref 4.22–5.81)
RDW: 14 % (ref 11.5–15.5)
WBC: 8.3 10*3/uL (ref 4.0–10.5)

## 2021-05-13 LAB — LIPASE: Lipase: 57 U/L (ref 11.0–59.0)

## 2021-05-13 NOTE — Progress Notes (Addendum)
Chief Complaint  Patient presents with   Hospitalization Follow-up   HFU at [facility] 7/6-05/02/21 for pancreatitis/GB removal MRCP negative had gallstones in GB on ivf, iv zosyn still having right flank/right lateral back pain 2/10 today completed augmentin bid x 7 days Friday 05/09/21 doing well no n/v prior to hospital 04/26/21 had pizza and not well and 04/28/21 hamburger made worse and had fatigue, orange red urine now yellow after discharge MRI abdomen + kidney and pancreatic cysts   Review of Systems  Constitutional:  Negative for weight loss.  HENT:  Negative for hearing loss.   Eyes:  Negative for blurred vision.  Respiratory:  Negative for shortness of breath.   Cardiovascular:  Negative for chest pain.  Gastrointestinal:  Negative for heartburn.  Musculoskeletal:  Negative for falls and joint pain.  Skin:  Negative for rash.  Neurological:  Negative for headaches.  Psychiatric/Behavioral:  Negative for depression.   Past Medical History:  Diagnosis Date   Actinic keratosis    Allergy    Anemia    Arthritis    Basal cell carcinoma    Cancer (HCC)    BASAL CELL-FACE   Complication of anesthesia    PT IS A RED HEAD AND STATES IT TAKES ALOT MORE ANESTHESIA TO SEDATE HIM   Diabetes mellitus without complication (HCC)    Dysrhythmia    SVT   Fatty liver    GERD (gastroesophageal reflux disease)    Hyperlipidemia    Melanoma ([place]) 02/02/2019   Left forearm. Superficial spreading. Tumor thickness 0.66m, anatomic level III. Excised: 02/22/2019, margins free.   Multiple thyroid nodules    Rheumatic fever    Sleep apnea 1997   Uses C-Pap machine   SVT (supraventricular tachycardia) (HCC)    Thyroid nodule    Past Surgical History:  Procedure Laterality Date   APPENDECTOMY  1965   BICEPS TENDON REPAIR Right 05/2015   COLONOSCOPY  2016   LUMBAR FUSION  2015   L4/L5   RHINOPLASTY  1997   TONSILLECTOMY  1956   VASECTOMY  1981   Family History  Problem Relation Age of Onset    Arthritis Mother    Lung cancer Mother    Hyperlipidemia Mother    Diabetes Mother    Cholelithiasis Mother        gb removed   Arthritis Father    Diabetes Father    Lung disease Father    Pancreatitis Father        died age 2775pancreatitis   Cholelithiasis Brother        GB removed   Hyperlipidemia Maternal Grandmother    Alcohol abuse Maternal Grandfather    Hyperlipidemia Maternal Grandfather    Drug abuse Cousin    Hyperlipidemia Other        maternal side of family    Cholelithiasis Daughter        GB removed   Social History   Socioeconomic History   Marital status: Married    Spouse name: Not on file   Number of children: Not on file   Years of education: Not on file   Highest education level: Not on file  Occupational History   Not on file  Tobacco Use   Smoking status: Never   Smokeless tobacco: Former    Types: Snuff  Vaping Use   Vaping Use: Never used  Substance and Sexual Activity   Alcohol use: Yes    Alcohol/week: 1.0 - 2.0 standard drink  Types: 1 - 2 Cans of beer per week    Comment: OCC   Drug use: No   Sexual activity: Not Currently  Other Topics Concern   Not on file  Social History Narrative   Married    Former Nature conservation officer    2 daughters   Social Determinants of Radio broadcast assistant Strain: Low Risk    Difficulty of Paying Living Expenses: Not hard at all  Food Insecurity: No Food Insecurity   Worried About Charity fundraiser in the Last Year: Never true   Arboriculturist in the Last Year: Never true  Transportation Needs: No Transportation Needs   Lack of Transportation (Medical): No   Lack of Transportation (Non-Medical): No  Physical Activity: Not on file  Stress: No Stress Concern Present   Feeling of Stress : Not at all  Social Connections: Unknown   Frequency of Communication with Friends and Family: Not on file   Frequency of Social Gatherings with Friends and Family: Not on file   Attends Religious Services: Not  on file   Active Member of Clubs or Organizations: Not on file   Attends Archivist Meetings: Not on file   Marital Status: Married  Human resources officer Violence: Not At Risk   Fear of Current or Ex-Partner: No   Emotionally Abused: No   Physically Abused: No   Sexually Abused: No   Current Meds  Medication Sig   azelastine (ASTELIN) 0.1 % nasal spray Place 1 spray into both nostrils 2 (two) times daily as needed for rhinitis or allergies. Use in each nostril as directed   cetirizine (ZYRTEC) 10 MG tablet Take 10 mg by mouth at bedtime.    cholecalciferol (VITAMIN D3) 25 MCG (1000 UT) tablet Take 1,000 Units by mouth daily.   Coenzyme Q10 (COQ10) 200 MG CAPS Take 200 mg by mouth daily.    Continuous Blood Gluc Sensor (FREESTYLE LIBRE 2 SENSOR) MISC Use to check glucose Q8H   Dulaglutide (TRULICITY) 5.62 BW/3.8LH SOPN Inject 0.75 mg into the skin every Tuesday.   ibuprofen (ADVIL) 600 MG tablet Take 1 tablet (600 mg total) by mouth every 6 (six) hours as needed.   metFORMIN (GLUCOPHAGE) 1000 MG tablet TAKE 1 TABLET TWICE A DAY WITH MEALS   metoprolol succinate (TOPROL-XL) 50 MG 24 hr tablet TAKE 1 TABLET DAILY WITH OR IMMEDIATELY FOLLOWING A MEAL (DOSE CHANGE)   Multiple Vitamins-Minerals (MULTIVITAMIN ADULT PO) Take 1 tablet by mouth daily.    Omega-3 Fatty Acids (FISH OIL) 1200 MG CPDR Take 1 tablet by mouth daily.   omeprazole (PRILOSEC) 40 MG capsule Take 1 capsule (40 mg total) by mouth at bedtime.   Red Yeast Rice 600 MG TABS Take 600 mg by mouth 2 (two) times daily with a meal.   tamsulosin (FLOMAX) 0.4 MG CAPS capsule Take 1 capsule (0.4 mg total) by mouth at bedtime.   triamcinolone ointment (KENALOG) 0.1 % Apply 1 application topically daily. As needed and cover with bandaid. Avoid face, groin, underarms.   vitamin C (ASCORBIC ACID) 500 MG tablet Take 1,000 mg by mouth daily.    Vitamin E 400 units TABS Take 400 Units by mouth daily.    Allergies  Allergen Reactions    Januvia [Sitagliptin]     Kidney pain    Lipitor [Atorvastatin]     10 mg ?myalgias   Statins Other (See Comments)    Myalgias; atorvastatin 10 mg, pravastatin 10 mg, rosuvastatin w/ unknown  dose.    Recent Results (from the past 2160 hour(s))  Bladder Scan (Post Void Residual) in office     Status: None   Collection Time: 03/06/21  1:49 PM  Result Value Ref Range   Scan Result 366m   Testosterone     Status: Abnormal   Collection Time: 03/11/21  8:06 AM  Result Value Ref Range   Testosterone 267.63 (L) 300.00 - 890.00 ng/dL  B12     Status: Abnormal   Collection Time: 03/11/21  8:06 AM  Result Value Ref Range   Vitamin B-12 1,177 (H) 211 - 911 pg/mL  Vitamin D (25 hydroxy)     Status: None   Collection Time: 03/11/21  8:06 AM  Result Value Ref Range   VITD 36.25 30.00 - 100.00 ng/mL  Iron, TIBC and Ferritin Panel     Status: Abnormal   Collection Time: 03/11/21  8:06 AM  Result Value Ref Range   Total Iron Binding Capacity 383 250 - 450 ug/dL   UIBC 328 111 - 343 ug/dL   Iron 55 38 - 169 ug/dL   Iron Saturation 14 (L) 15 - 55 %   Ferritin 45 30 - 400 ng/mL  Hemoglobin A1c     Status: Abnormal   Collection Time: 03/11/21  8:06 AM  Result Value Ref Range   Hgb A1c MFr Bld 6.8 (H) 4.6 - 6.5 %    Comment: Glycemic Control Guidelines for People with Diabetes:Non Diabetic:  <6%Goal of Therapy: <7%Additional Action Suggested:  >8%   Urinalysis, Routine w reflex microscopic     Status: None   Collection Time: 03/11/21  8:06 AM  Result Value Ref Range   Specific Gravity, UA 1.020 1.005 - 1.030   pH, UA 6.0 5.0 - 7.5   Color, UA Yellow Yellow   Appearance Ur Clear Clear   Leukocytes,UA Negative Negative   Protein,UA Negative Negative/Trace   Glucose, UA Negative Negative   Ketones, UA Negative Negative   RBC, UA Negative Negative   Bilirubin, UA Negative Negative   Urobilinogen, Ur 0.2 0.2 - 1.0 mg/dL   Nitrite, UA Negative Negative   Microscopic Examination Comment      Comment: Microscopic not indicated and not performed.  TSH     Status: None   Collection Time: 03/11/21  8:06 AM  Result Value Ref Range   TSH 0.56 0.35 - 4.50 uIU/mL  CBC w/Diff     Status: Abnormal   Collection Time: 03/11/21  8:06 AM  Result Value Ref Range   WBC 12.4 (H) 4.0 - 10.5 K/uL   RBC 5.09 4.22 - 5.81 Mil/uL   Hemoglobin 15.1 13.0 - 17.0 g/dL   HCT 44.8 39.0 - 52.0 %   MCV 88.0 78.0 - 100.0 fl   MCHC 33.7 30.0 - 36.0 g/dL   RDW 14.5 11.5 - 15.5 %   Platelets 246.0 150.0 - 400.0 K/uL   Neutrophils Relative % 73.4 43.0 - 77.0 %   Lymphocytes Relative 20.0 12.0 - 46.0 %   Monocytes Relative 5.4 3.0 - 12.0 %   Eosinophils Relative 0.8 0.0 - 5.0 %   Basophils Relative 0.4 0.0 - 3.0 %   Neutro Abs 9.1 (H) 1.4 - 7.7 K/uL   Lymphs Abs 2.5 0.7 - 4.0 K/uL   Monocytes Absolute 0.7 0.1 - 1.0 K/uL   Eosinophils Absolute 0.1 0.0 - 0.7 K/uL   Basophils Absolute 0.1 0.0 - 0.1 K/uL  Lipid panel     Status: Abnormal  Collection Time: 03/11/21  8:06 AM  Result Value Ref Range   Cholesterol 215 (H) 0 - 200 mg/dL    Comment: ATP III Classification       Desirable:  < 200 mg/dL               Borderline High:  200 - 239 mg/dL          High:  > = 240 mg/dL   Triglycerides 191.0 (H) 0.0 - 149.0 mg/dL    Comment: Normal:  <150 mg/dLBorderline High:  150 - 199 mg/dL   HDL 37.70 (L) >39.00 mg/dL   VLDL 38.2 0.0 - 40.0 mg/dL   LDL Cholesterol 139 (H) 0 - 99 mg/dL   Total CHOL/HDL Ratio 6     Comment:                Men          Women1/2 Average Risk     3.4          3.3Average Risk          5.0          4.42X Average Risk          9.6          7.13X Average Risk          15.0          11.0                       NonHDL 177.15     Comment: NOTE:  Non-HDL goal should be 30 mg/dL higher than patient's LDL goal (i.e. LDL goal of < 70 mg/dL, would have non-HDL goal of < 100 mg/dL)  Comprehensive metabolic panel     Status: Abnormal   Collection Time: 03/11/21  8:06 AM  Result Value Ref Range    Sodium 137 135 - 145 mEq/L   Potassium 4.2 3.5 - 5.1 mEq/L   Chloride 100 96 - 112 mEq/L   CO2 29 19 - 32 mEq/L   Glucose, Bld 155 (H) 70 - 99 mg/dL   BUN 22 6 - 23 mg/dL   Creatinine, Ser 1.18 0.40 - 1.50 mg/dL   Total Bilirubin 0.4 0.2 - 1.2 mg/dL   Alkaline Phosphatase 46 39 - 117 U/L   AST 16 0 - 37 U/L   ALT 31 0 - 53 U/L   Total Protein 6.6 6.0 - 8.3 g/dL   Albumin 4.3 3.5 - 5.2 g/dL   GFR 63.37 >60.00 mL/min    Comment: Calculated using the CKD-EPI Creatinine Equation (2021)   Calcium 9.5 8.4 - 10.5 mg/dL  Comprehensive metabolic panel     Status: Abnormal   Collection Time: 04/30/21 12:38 AM  Result Value Ref Range   Sodium 133 (L) 135 - 145 mmol/L   Potassium 4.7 3.5 - 5.1 mmol/L    Comment: HEMOLYSIS AT THIS LEVEL MAY AFFECT RESULT   Chloride 102 98 - 111 mmol/L   CO2 20 (L) 22 - 32 mmol/L   Glucose, Bld 163 (H) 70 - 99 mg/dL    Comment: Glucose reference range applies only to samples taken after fasting for at least 8 hours.   BUN 18 8 - 23 mg/dL   Creatinine, Ser 1.03 0.61 - 1.24 mg/dL   Calcium 8.3 (L) 8.9 - 10.3 mg/dL   Total Protein 6.8 6.5 - 8.1 g/dL   Albumin 3.6 3.5 - 5.0 g/dL   AST 111 (  H) 15 - 41 U/L   ALT 147 (H) 0 - 44 U/L   Alkaline Phosphatase 121 38 - 126 U/L   Total Bilirubin 3.9 (H) 0.3 - 1.2 mg/dL   GFR, Estimated >60 >60 mL/min    Comment: (NOTE) Calculated using the CKD-EPI Creatinine Equation (2021)    Anion gap 11 5 - 15    Comment: Performed at [facility], [address] [place], [place] [address]  CBC     Status: Abnormal   Collection Time: 04/30/21 12:38 AM  Result Value Ref Range   WBC 10.8 (H) 4.0 - 10.5 K/uL   RBC 4.61 4.22 - 5.81 MIL/uL   Hemoglobin 14.1 13.0 - 17.0 g/dL   HCT 40.9 39.0 - 52.0 %   MCV 88.7 80.0 - 100.0 fL   MCH 30.6 26.0 - 34.0 pg   MCHC 34.5 30.0 - 36.0 g/dL   RDW 13.9 11.5 - 15.5 %   Platelets 218 150 - 400 K/uL   nRBC 0.0 0.0 - 0.2 %    Comment: Performed at [facility], [address] [place], [place] [address]  Urinalysis, Complete w Microscopic     Status: Abnormal   Collection Time: 04/30/21 12:39 AM  Result Value Ref Range   Color, Urine YELLOW (A) YELLOW   APPearance CLEAR (A) CLEAR   Specific Gravity, Urine 1.009 1.005 - 1.030   pH 6.0 5.0 - 8.0   Glucose, UA NEGATIVE NEGATIVE mg/dL   Hgb urine dipstick NEGATIVE NEGATIVE   Bilirubin Urine NEGATIVE NEGATIVE   Ketones, ur NEGATIVE NEGATIVE mg/dL   Protein, ur NEGATIVE NEGATIVE mg/dL   Nitrite NEGATIVE NEGATIVE   Leukocytes,Ua NEGATIVE NEGATIVE   RBC / HPF 0-5 0 - 5 RBC/hpf   WBC, UA 0-5 0 - 5 WBC/hpf   Bacteria, UA NONE SEEN NONE SEEN   Squamous Epithelial / LPF 0-5 0 - 5    Comment: Performed at [facility], [address] [place], [place] [address]  Protime-INR     Status: None   Collection Time: 04/30/21  2:50 AM  Result Value Ref Range   Prothrombin Time 13.9 11.4 - 15.2 seconds   INR 1.1 0.8 - 1.2    Comment: (NOTE) INR goal varies based on device and disease states. Performed at [facility], [address] [place], [place] [address]   Resp Panel by RT-PCR (Flu A&B, Covid) Nasopharyngeal Swab     Status: None   Collection Time: 04/30/21  3:14 AM   Specimen: Nasopharyngeal Swab; Nasopharyngeal(NP) swabs in vial transport medium  Result Value Ref Range   SARS Coronavirus 2 by RT PCR NEGATIVE NEGATIVE    Comment: (NOTE) SARS-CoV-2 target nucleic acids are NOT DETECTED.  The SARS-CoV-2 RNA is generally detectable in upper respiratory specimens during the acute phase of infection. The lowest concentration of SARS-CoV-2 viral copies this assay can detect is 138 copies/mL. A negative result does not preclude SARS-Cov-2 infection and should not be used as the sole basis for treatment or other patient management decisions. A negative result may occur with  improper specimen collection/handling, submission of specimen other than nasopharyngeal swab, presence of viral  mutation(s) within the areas targeted by this assay, and inadequate number of viral copies(<138 copies/mL). A negative result must be combined with clinical observations, patient history, and epidemiological information. The expected result is Negative.  Fact Sheet for Patients:  EntrepreneurPulse.com.au  Fact Sheet for Healthcare Providers:  IncredibleEmployment.be  This test is no  t yet approved or cleared by the [place] and  has been authorized for detection and/or diagnosis of SARS-CoV-2 by FDA under an Emergency Use Authorization (EUA). This EUA will remain  in effect (meaning this test can be used) for the duration of the COVID-19 declaration under Section 564(b)(1) of the Act, 21 U.S.C.section 360bbb-3(b)(1), unless the authorization is terminated  or revoked sooner.       Influenza A by PCR NEGATIVE NEGATIVE   Influenza B by PCR NEGATIVE NEGATIVE    Comment: (NOTE) The Xpert Xpress SARS-CoV-2/FLU/RSV plus assay is intended as an aid in the diagnosis of influenza from Nasopharyngeal swab specimens and should not be used as a sole basis for treatment. Nasal washings and aspirates are unacceptable for Xpert Xpress SARS-CoV-2/FLU/RSV testing.  Fact Sheet for Patients: EntrepreneurPulse.com.au  Fact Sheet for Healthcare Providers: IncredibleEmployment.be  This test is not yet approved or cleared by the [place] FDA and has been authorized for detection and/or diagnosis of SARS-CoV-2 by FDA under an Emergency Use Authorization (EUA). This EUA will remain in effect (meaning this test can be used) for the duration of the COVID-19 declaration under Section 564(b)(1) of the Act, 21 U.S.C. section 360bbb-3(b)(1), unless the authorization is terminated or revoked.  Performed at [facility], [address] [place], [place] [address]   Type and screen [place]     Status: None   Collection Time: 04/30/21  3:14 AM  Result Value Ref Range   ABO/RH(D) A NEG    Antibody Screen NEG    Sample Expiration      05/03/2021,2359 Performed at [facility], [address] [place], [place] [address]   Lipase, blood     Status: Abnormal   Collection Time: 04/30/21  4:10 AM  Result Value Ref Range   Lipase 257 (H) 11 - 51 U/L    Comment: RESULT CONFIRMED BY MANUAL DILUTION RH Performed at [facility], [address] [place], [place] [address]   CBG monitoring, ED     Status: Abnormal   Collection Time: 04/30/21  7:56 AM  Result Value Ref Range   Glucose-Capillary 121 (H) 70 - 99 mg/dL    Comment: Glucose reference range applies only to samples taken after fasting for at least 8 hours.  HIV Antibody (routine testing w rflx)     Status: None   Collection Time: 04/30/21  8:47 AM  Result Value Ref Range   HIV Screen 4th Generation wRfx Non Reactive Non Reactive    Comment: Performed at [facility], [address], [place], [place] [address]  Lipase, blood     Status: Abnormal   Collection Time: 04/30/21  8:47 AM  Result Value Ref Range   Lipase 199 (H) 11 - 51 U/L    Comment: Performed at [facility], [address] [place],  [address]  Hepatic function panel     Status: Abnormal   Collection Time: 04/30/21  8:47 AM  Result Value Ref Range   Total Protein 6.6 6.5 - 8.1 g/dL   Albumin 3.4 (L) 3.5 - 5.0 g/dL   AST 77 (H) 15 - 41 U/L   ALT 136 (H) 0 - 44 U/L   Alkaline Phosphatase 121 38 - 126 U/L   Total Bilirubin 1.7 (H) 0.3 - 1.2 mg/dL   Bilirubin, Direct 0.7 (H) 0.0 - 0.2 mg/dL   Indirect Bilirubin 1.0 (H) 0.3 - 0.9 mg/dL    Comment:  Performed at [facility], [address] [place], [place] [address]  CBG monitoring, ED     Status: Abnormal   Collection Time: 04/30/21 12:03 PM  Result Value Ref Range   Glucose-Capillary 108 (H) 70 - 99 mg/dL    Comment: Glucose reference range  applies only to samples taken after fasting for at least 8 hours.  Surgical PCR screen     Status: Abnormal   Collection Time: 04/30/21  1:42 PM   Specimen: Nasal Mucosa; Nasal Swab  Result Value Ref Range   MRSA, PCR NEGATIVE NEGATIVE   Staphylococcus aureus POSITIVE (A) NEGATIVE    Comment: (NOTE) The Xpert SA Assay (FDA approved for NASAL specimens in patients 71 years of age and older), is one component of a comprehensive surveillance program. It is not intended to diagnose infection nor to guide or monitor treatment. Performed at [facility], [address] [place], [place] [address]   Surgical pathology     Status: None   Collection Time: 04/30/21  3:45 PM  Result Value Ref Range   SURGICAL PATHOLOGY      SURGICAL PATHOLOGY CASE: [id number] PATIENT: Lenard Lance Surgical Pathology Report     Specimen Submitted: A. Gallbladder  Clinical History: Acute cholecystitis      DIAGNOSIS: A. GALLBLADDER, CHOLECYSTECTOMY: - ACUTE CHOLECYSTITIS WITH SEROSITIS AND CHOLELITHIASIS. - ONE BENIGN LYMPH NODE. - NEGATIVE FOR MALIGNANCY.   GROSS DESCRIPTION: A. Labeled: Gallbladder Received: Formalin Collection time: 8:20 PM on 04/30/2021 Placed into formalin time: 8:27 PM on 04/30/2021 Size of specimen: 7.5 x 4.3 x 2.9 cm Specimen integrity: Focally disrupted External surface: The serosa is red-tan, smooth, and glistening with scattered areas of overlying white fibrinous exudate.  There is a roughened hepatic bed which has a 0.6 x 0.3 cm transmural defect. Wall thickness: Ranges from 0.1-0.6 cm Mucosa: The mucosa is red-tan and velvety with scattered areas of cholesterolosis.  There are scattered areas of adherent tan-pink friable soft tissue. Cystic duct: The cystic  duct is 1.3 x 0.3 x 0.2 cm.  The duct is grossly patent and unremarkable.  There is an adjacent 2 x 0.9 x 0.8 cm lymph node candidate.  The lymph node candidate is focally hemorrhagic. Bile  present: There is a scant amount of tan viscous bile with fragments of tan-pink hemorrhagic friable soft tissue. Stones present: There is a single black, firm, and smooth stone, 0.3 x 0.2 x 0.2 cm. Other findings: Within the wall there is a 1 x 1 x 0.5 cm hemorrhagic cavity which may represent a potential abscess.  The wall has overlying areas of adipose tissue.  Block summary: 1 - cystic duct resection margin, en face and inked blue, with representative wall, cholesterolosis, and serosal exudate 2 - representative thickened wall with potential abscess and adherent soft tissue 3 - 4 - lymph node candidate, serially sectioned and submitted entirely  RB 05/01/2021  Final Diagnosis performed by Betsy Pries, MD.   Electronically signed 05/02/2021 11:35:32AM The electronic signature  indicates that the named Attending Pathologist has evaluated the specimen Technical component performed at [facility], [address] [place], [place] [address] Lab: [phone] Dir: Rush Farmer, MD, MMM  Professional component performed at [facility], [facility], [place], [place],  [address] Lab: [phone] Dir: Dellia Nims. Rubinas, MD   Glucose, capillary     Status: Abnormal   Collection Time: 04/30/21  9:17 PM  Result Value Ref Range   Glucose-Capillary 195 (H) 70 - 99 mg/dL  Comment: Glucose reference range applies only to samples taken after fasting for at least 8 hours.  CBC     Status: Abnormal   Collection Time: 05/01/21  4:44 AM  Result Value Ref Range   WBC 10.0 4.0 - 10.5 K/uL   RBC 4.28 4.22 - 5.81 MIL/uL   Hemoglobin 13.2 13.0 - 17.0 g/dL   HCT 38.4 (L) 39.0 - 52.0 %   MCV 89.7 80.0 - 100.0 fL   MCH 30.8 26.0 - 34.0 pg   MCHC 34.4 30.0 - 36.0 g/dL   RDW 14.1 11.5 - 15.5 %   Platelets 245 150 - 400 K/uL   nRBC 0.0 0.0 - 0.2 %    Comment: Performed at [facility], [address] [place], [place] [address]  Lipase, blood     Status: Abnormal    Collection Time: 05/01/21  4:44 AM  Result Value Ref Range   Lipase 66 (H) 11 - 51 U/L    Comment: Performed at [facility], [address] [place], [place] [address]  Magnesium     Status: None   Collection Time: 05/01/21  4:44 AM  Result Value Ref Range   Magnesium 2.3 1.7 - 2.4 mg/dL    Comment: Performed at [facility], [address] [place], [place] [address]  Comprehensive metabolic panel     Status: Abnormal   Collection Time: 05/01/21  4:44 AM  Result Value Ref Range   Sodium 137 135 - 145 mmol/L   Potassium 4.3 3.5 - 5.1 mmol/L   Chloride 105 98 - 111 mmol/L   CO2 24 22 - 32 mmol/L   Glucose, Bld 144 (H) 70 - 99 mg/dL    Comment: Glucose reference range applies only to samples taken after fasting for at least 8 hours.   BUN 21 8 - 23 mg/dL   Creatinine, Ser 1.14 0.61 - 1.24 mg/dL   Calcium 7.8 (L) 8.9 - 10.3 mg/dL   Total Protein 6.2 (L) 6.5 - 8.1 g/dL   Albumin 3.0 (L) 3.5 - 5.0 g/dL   AST 149 (H) 15 - 41 U/L   ALT 212 (H) 0 - 44 U/L   Alkaline Phosphatase 121 38 - 126 U/L   Total Bilirubin 1.4 (H) 0.3 - 1.2 mg/dL   GFR, Estimated >60 >60 mL/min    Comment: (NOTE) Calculated using the CKD-EPI Creatinine Equation (2021)    Anion gap 8 5 - 15    Comment: Performed at [facility], [address] [place], [place] [address]  Glucose, capillary     Status: Abnormal   Collection Time: 05/01/21  9:10 PM  Result Value Ref Range   Glucose-Capillary 147 (H) 70 - 99 mg/dL    Comment: Glucose reference range applies only to samples taken after fasting for at least 8 hours.  CBC     Status: Abnormal   Collection Time: 05/02/21  6:21 AM  Result Value Ref Range   WBC 9.4 4.0 - 10.5 K/uL   RBC 4.15 (L) 4.22 - 5.81 MIL/uL   Hemoglobin 12.8 (L) 13.0 - 17.0 g/dL   HCT 36.9 (L) 39.0 - 52.0 %   MCV 88.9 80.0 - 100.0 fL   MCH 30.8 26.0 - 34.0 pg   MCHC 34.7 30.0 - 36.0 g/dL   RDW 14.2 11.5 - 15.5 %   Platelets 227 150 - 400 K/uL   nRBC 0.0 0.0  - 0.2 %    Comment: Performed at [facility], [place], [place] [address]  Comprehensive metabolic panel     Status: Abnormal   Collection Time: 05/02/21  6:21 AM  Result Value Ref Range   Sodium 135 135 - 145 mmol/L   Potassium 3.9 3.5 - 5.1 mmol/L   Chloride 106 98 - 111 mmol/L   CO2 24 22 - 32 mmol/L   Glucose, Bld 124 (H) 70 - 99 mg/dL    Comment: Glucose reference range applies only to samples taken after fasting for at least 8 hours.   BUN 17 8 - 23 mg/dL   Creatinine, Ser 1.14 0.61 - 1.24 mg/dL   Calcium 7.7 (L) 8.9 - 10.3 mg/dL   Total Protein 6.2 (L) 6.5 - 8.1 g/dL   Albumin 2.8 (L) 3.5 - 5.0 g/dL   AST 94 (H) 15 - 41 U/L   ALT 170 (H) 0 - 44 U/L   Alkaline Phosphatase 87 38 - 126 U/L   Total Bilirubin 1.1 0.3 - 1.2 mg/dL   GFR, Estimated >60 >60 mL/min    Comment: (NOTE) Calculated using the CKD-EPI Creatinine Equation (2021)    Anion gap 5 5 - 15    Comment: Performed at [facility], [address], [place], [place] [address]  Magnesium     Status: None   Collection Time: 05/02/21  6:21 AM  Result Value Ref Range   Magnesium 2.3 1.7 - 2.4 mg/dL    Comment: Performed at [facility], [address] [place], [place] [id number]  Glucose, capillary     Status: Abnormal   Collection Time: 05/02/21  8:18 AM  Result Value Ref Range   Glucose-Capillary 134 (H) 70 - 99 mg/dL    Comment: Glucose reference range applies only to samples taken after fasting for at least 8 hours.   Objective  Body mass index is 29.9 kg/m. Wt Readings from Last 3 Encounters:  05/13/21 226 lb 9.6 oz (102.8 kg)  05/07/21 227 lb (103 kg)  04/30/21 231 lb (104.8 kg)   Temp Readings from Last 3 Encounters:  05/13/21 97.9 F (36.6 C) (Oral)  05/07/21 98.2 F (36.8 C) (Oral)  05/02/21 98.5 F (36.9 C)   BP Readings from Last 3 Encounters:  05/13/21 114/68  05/07/21 133/88  05/02/21 (!) 143/83   Pulse Readings from Last 3 Encounters:   05/13/21 72  05/07/21 64  05/02/21 63    Physical Exam Vitals and nursing note reviewed.  Constitutional:      Appearance: Normal appearance. He is well-developed and well-groomed. He is obese.  HENT:     Head: Normocephalic and atraumatic.  Eyes:     Conjunctiva/sclera: Conjunctivae normal.     Pupils: Pupils are equal, round, and reactive to light.  Cardiovascular:     Rate and Rhythm: Normal rate and regular rhythm.     Heart sounds: Normal heart sounds. No murmur heard. Pulmonary:     Effort: Pulmonary effort is normal.     Breath sounds: Normal breath sounds.  Abdominal:     General: Abdomen is flat. Bowel sounds are normal.     Tenderness: There is abdominal tenderness. There is right CVA tenderness.  Skin:    General: Skin is warm and dry.  Neurological:     General: No focal deficit present.     Mental Status: He is alert and oriented to person, place, and time.     Gait: Gait normal.  Psychiatric:        Attention  and Perception: Attention and perception normal.        Mood and Affect: Mood and affect normal.        Speech: Speech normal.        Behavior: Behavior normal. Behavior is cooperative.        Thought Content: Thought content normal.        Cognition and Memory: Cognition and memory normal.        Judgment: Judgment normal.    Assessment  Plan  Elevated liver enzymes  due to cholecystitis and pancreatitis likely caused by gallstones MRCP negative- Plan: Comprehensive metabolic panel  Anemia, unspecified type - Plan: CBC with Differential/Platelet  Open wound x 4-5 open healing wounds s/p GB removal  Kidney cysts Pancreatic cyst  F/u MRI abdomen will disc with radiology when   HM Reviewed labs 05/2020  Flu shot utd  Tdap utd Had prevnar  pna 23 vaccine utd 3/3 covid vaccines consider 4th Consider shingrix in future given Rx prev and he has declines vaccine twinrix 3/3 immune MMR immune   PSA had 07/10/2019 4.3 elevated then 12/26/19 3.5  estab urology Dr. Diamantina [place] f/u 01/16/21 1pm Results for SHEILA, GERVASI" (MRN [id number]) as of 12/30/2020 13:50   Ref. Range 07/10/2019 15:13 12/26/2019 14:19 12/20/2020 13:49  Prostate Specific Ag, Serum Latest Ref Range: 0.0 - 4.0 ng/mL 4.3 (H) 3.5 4.1 (H)    PSA sch this week 12/20/20   Results for DIEZEL, MAZUR" (MRN [id number]) as of 05/13/2021 13:16  Ref. Range 12/20/2020 13:49  Prostate Specific Ag, Serum Latest Ref Range: 0.0 - 4.0 ng/mL 4.1 (H)  PSA, FREE Latest Ref Range: N/A ng/mL 0.91  % FREE PSA Latest Units: % 22.2   Zoster had 03/13/15, declines shingrix    hep C neg   Derm Dr. Laurence Ferrari LN2 arms, scalp due 08/2019 with h/o melanoma and seen 2021  Saw 11/05/20 f/u 05/05/21    Colonoscopy 10/17/15 normal fair prep consider repeat in 5 years 09/2020  no FH colon cancer  Pt wanted to do cologuard 09/05/20 negative  With iron def consider EGD/colonoscopy   rec healthy diet and exercise    CT chest had 04/06/18  CT CHEST WITHOUT CONTRAST   TECHNIQUE: Multidetector CT imaging of the chest was performed following the standard protocol without IV contrast.   COMPARISON:  None.   FINDINGS: Cardiovascular: Normal heart size. Trace pericardial thickening/fluid. Normal caliber thoracic aorta. Minimal atherosclerotic calcification of the aortic valve.   Mediastinum/Nodes: No enlarged mediastinal or axillary lymph nodes. Enlarged, nodular right thyroid lobe with coarse calcifications. The trachea and esophagus demonstrate no significant findings.   Lungs/Pleura: No focal consolidation, pleural effusion, or pneumothorax. Small focal areas of scarring in the lingula and left lower lobe. No suspicious pulmonary nodule.   Upper Abdomen: No acute abnormality. 2.0 cm simple cyst in the midpole of the right kidney. Subcentimeter lesions in the upper poles of both kidneys are too small to characterize.   Musculoskeletal: No chest wall mass or suspicious bone  lesions identified.   IMPRESSION: 1.  No acute intrathoracic process. 2. Enlarged, nodular right thyroid lobe. Please see thyroid ultrasound from same day for further evaluation.   US thyroid 04/06/18 bx kc endo 04/2019 benign f/u 05/01/20 repeat thyroid US and 05/08/20 f/u Dr. Honor Junes    IMPRESSION: 1. Mild thyromegaly with bilateral nodules. 2. Recommend FNA biopsy of suspicious 2.5 cm inferior right AND moderately suspicious 2.8 cm mid right nodules. 3. Recommend annual/biennial ultrasound  follow-up of additional lesions as above until stability x5 years confirmed Thyroid bx 04/2019 benign    Sleep study 09/14/18 sleep med mild OSA total AHI 10 per hr, cpap titration/bipap 9 cm h20 but turned down to 8 -rec wt loss , nasal congestion, CPAP at 8 cm h20 he also had high periodic limb movement index with only few arousals ?RLS +/- insomnia   -CPAP 8 cm H20 humidified, [place] Full face mask large measures to maintain airway address nasal congestion   04/30/21 MRI/MRCP (05/14/21 spoke with radiology likely pancreas findings post inflammatory and rec repeat MRI in 6 months) FINDINGS: Comment: Today's study is limited for detection and characterization of visceral and/or vascular lesions by lack of IV gadolinium.   Lower chest: Unremarkable.   Hepatobiliary: Diffuse loss of signal intensity throughout the hepatic parenchyma on out of phase dual echo images, indicative of a background of hepatic steatosis. No suspicious cystic or solid hepatic lesions are confidently identified on today's noncontrast examination. Tiny filling defects are present within the lumen of the gallbladder, compatible with small gallstones. Gallbladder is moderately distended. Gallbladder wall appears thickened and edematous measuring up to 11 mm in thickness with trace volume of pericholecystic fluid. There are no filling defects within the common bile duct to suggest choledocholithiasis. Common bile  duct measures 3 mm in the porta hepatis. No intrahepatic biliary ductal dilatation noted on MRCP images.   Pancreas: In the inferior aspect of the pancreatic head (axial image 27 of series 7) there is a 9 mm T2 hyperintense lesion. In the pancreatic body (axial image 16 of series 7) there is an additional 6 mm T2 hyperintense lesion. No definite solid appearing pancreatic mass noted on today's noncontrast examination. No pancreatic ductal dilatation noted on MRCP images. No peripancreatic fluid collections or inflammatory changes.   Spleen:  Unremarkable.   Adrenals/Urinary Tract: There are several T1 hypointense and T2 hyperintense lesions in the kidneys, measuring up to 2.9 cm in diameter in the medial aspect of the upper pole of the right kidney. In addition, in the upper pole of the right kidney laterally there is a 1.3 cm lesion (axial image 32 of series 19) which is mildly T1 hyperintense and T2 hyperintense. No hydroureteronephrosis in the visualized portions of the abdomen. Bilateral adrenal glands are normal in appearance.   Stomach/Bowel: Visualized portions are unremarkable.   Vascular/Lymphatic: No aneurysm identified in the visualized abdominal vasculature. No lymphadenopathy noted in the abdomen.   Other: No significant volume of ascites noted in the visualized portions of the peritoneal cavity.   Musculoskeletal: No aggressive appearing osseous lesions are noted in the visualize portions of the skeleton. Signal void noted in the lumbar spine related to indwelling spinal fixation hardware.   IMPRESSION: 1. Study is positive for cholelithiasis with imaging findings suggestive of acute cholecystitis. No evidence of choledocholithiasis. No findings of biliary tract obstruction. 2. Subcentimeter cystic lesions in the pancreatic head and body. These are statistically likely to represent focal areas of side branch ectasia or small pancreatic pseudocysts, however,  the possibility of side branch IPMN (intraductal papillary mucinous neoplasm is not excluded). Recommend follow up pre and post contrast MRI/MRCP or pancreatic protocol CT in 2 years. This recommendation follows ACR consensus guidelines: Management of Incidental Pancreatic Cysts: A White Paper of the ACR Incidental Findings Committee. J Am Coll Radiol 1914;78:295-621. 3. Multiple renal lesions bilaterally, as above, incompletely characterized on today's noncontrast examination but likely to represent Bosniak class 1 and Bosniak class 2  cysts. Attention at time of repeat abdominal MRI is recommended to ensure stability. 4. Hepatic steatosis.  Provider: Dr. Olivia Mackie [name]-Internal Medicine

## 2021-05-13 NOTE — Patient Instructions (Addendum)
Dove antibacterial soap body wash

## 2021-05-13 NOTE — Progress Notes (Signed)
Patient given a Libre sensor sample as stated by Catie in 05/09/21 encounter note.

## 2021-05-14 ENCOUNTER — Encounter: Payer: Self-pay | Admitting: Internal Medicine

## 2021-05-14 DIAGNOSIS — D490 Neoplasm of unspecified behavior of digestive system: Secondary | ICD-10-CM | POA: Insufficient documentation

## 2021-05-15 ENCOUNTER — Telehealth: Payer: Self-pay | Admitting: Surgery

## 2021-05-15 NOTE — Telephone Encounter (Signed)
Spoke with patient-he is doing well-no issues and he states he does not need to be seen.

## 2021-05-15 NOTE — Telephone Encounter (Signed)
Outgoing call is made to the patient, left message for him to call the office.  He had robotic cholecystectomy done, drain removed but apparently still having some issues.  Per Dr. Hampton Abbot can add to his schedule for Friday 05/16/21 if patient would like.

## 2021-05-20 ENCOUNTER — Ambulatory Visit: Payer: Medicare Other | Admitting: Internal Medicine

## 2021-05-29 DIAGNOSIS — E042 Nontoxic multinodular goiter: Secondary | ICD-10-CM | POA: Diagnosis not present

## 2021-06-17 ENCOUNTER — Ambulatory Visit (INDEPENDENT_AMBULATORY_CARE_PROVIDER_SITE_OTHER): Payer: Medicare Other | Admitting: Dermatology

## 2021-06-17 ENCOUNTER — Other Ambulatory Visit: Payer: Self-pay

## 2021-06-17 DIAGNOSIS — L821 Other seborrheic keratosis: Secondary | ICD-10-CM | POA: Diagnosis not present

## 2021-06-17 DIAGNOSIS — L820 Inflamed seborrheic keratosis: Secondary | ICD-10-CM

## 2021-06-17 DIAGNOSIS — L570 Actinic keratosis: Secondary | ICD-10-CM | POA: Diagnosis not present

## 2021-06-17 DIAGNOSIS — B078 Other viral warts: Secondary | ICD-10-CM

## 2021-06-17 NOTE — Progress Notes (Signed)
Follow-Up Visit   Subjective  Jackson [name] is a 70 y.o. male who presents for the following: Actinic Keratosis (3 months f/u on face hx of Aks, pt c/o several scaly places on his face and ears). Growth at the right ear growing and irritating.   The following portions of the chart were reviewed this encounter and updated as appropriate:   Tobacco  Allergies  Meds  Problems  Med Hx  Surg Hx  Fam Hx      Review of Systems:  No other skin or systemic complaints except as noted in HPI or Assessment and Plan.  Objective  Well appearing patient in no apparent distress; mood and affect are within normal limits.  A focused examination was performed including face,ears, arms. Relevant physical exam findings are noted in the Assessment and Plan.  left temple, right forehead, right ear, nose Erythematous thin papules/macules with gritty scale.   left forehead Verrucous papules -- Discussed viral etiology and contagion.   right preauricular Erythematous keratotic or waxy stuck-on papule or plaque.    Assessment & Plan  AK (actinic keratosis) left temple, right forehead, right ear, nose  Actinic keratoses are precancerous spots that appear secondary to cumulative UV radiation exposure/sun exposure over time. They are chronic with expected duration over 1 year. A portion of actinic keratoses will progress to squamous cell carcinoma of the skin. It is not possible to reliably predict which spots will progress to skin cancer and so treatment is recommended to prevent development of skin cancer.  Recommend daily broad spectrum sunscreen SPF 30+ to sun-exposed areas, reapply every 2 hours as needed.  Recommend staying in the shade or wearing long sleeves, sun glasses (UVA+UVB protection) and wide brim hats (4-inch brim around the entire circumference of the hat). Call for new or changing lesions.   Start Fluorouracil: 5% Calcipotriene: 0.005% Vehicle: Cream Apply twice a day to  affected areas on the face x 4 days, apply twice a day to ears x 7 days   Patient provided with handout reviewing treatment course and side effects and advised to call or message [place] on MyChart with any concerns.   Other viral warts left forehead  Discussed viral etiology and risk of spread.  Discussed multiple treatments may be required to clear warts.  Discussed possible post-treatment dyspigmentation and risk of recurrence.   Prior to procedure, discussed risks of blister formation, small wound, skin dyspigmentation, or rare scar following cryotherapy. Recommend Vaseline ointment to treated areas while healing.   Destruction of lesion - left forehead Complexity: simple   Destruction method: cryotherapy   Informed consent: discussed and consent obtained   Timeout:  patient name, date of birth, surgical site, and procedure verified Lesion destroyed using liquid nitrogen: Yes   Region frozen until ice ball extended beyond lesion: Yes   Outcome: patient tolerated procedure well with no complications   Post-procedure details: wound care instructions given   Additional details:  Prior to procedure, discussed risks of blister formation, small wound, skin dyspigmentation, or rare scar following cryotherapy.     Inflamed seborrheic keratosis right preauricular  Prior to procedure, discussed risks of blister formation, small wound, skin dyspigmentation, or rare scar following cryotherapy. Recommend Vaseline ointment to treated areas while healing.   Destruction of lesion - right preauricular Complexity: simple   Destruction method: cryotherapy   Informed consent: discussed and consent obtained   Timeout:  patient name, date of birth, surgical site, and procedure verified Lesion destroyed using liquid  nitrogen: Yes   Region frozen until ice ball extended beyond lesion: Yes   Outcome: patient tolerated procedure well with no complications   Post-procedure details: wound care instructions  given   Additional details:  Prior to procedure, discussed risks of blister formation, small wound, skin dyspigmentation, or rare scar following cryotherapy.     Seborrheic Keratoses Arms  - Stuck-on, waxy, tan-brown papules and/or plaques  - Benign-appearing - Discussed benign etiology and prognosis. - Observe - Call for any changes   Return in about 2 months (around 08/21/2021) for Aks, TBSE.  I, Marye Round, CMA, am acting as scribe for Forest Gleason, MD .   Documentation: I have reviewed the above documentation for accuracy and completeness, and I agree with the above.  Forest Gleason, MD

## 2021-06-17 NOTE — Patient Instructions (Addendum)
Instructions for [facility] Medications  One or more of your medications was sent to the [facility] mail order compounding pharmacy. You will receive an email from them and can purchase the medicine through that link. It will then be mailed to your home at the address you confirmed. If for any reason you do not receive an email from them, please check your spam folder. If you still do not find the email, please let us know. [facility] phone number is [phone].     5-Fluorouracil/Calcipotriene Patient Education  Apply to face 4 days, apply to ears 7 days   Actinic keratoses are the dry, red scaly spots on the skin caused by sun damage. A portion of these spots can turn into skin cancer with time, and treating them can help prevent development of skin cancer.   Treatment of these spots requires removal of the defective skin cells. There are various ways to remove actinic keratoses, including freezing with liquid nitrogen, treatment with creams, or treatment with a blue light procedure in the office.   5-fluorouracil cream is a topical cream used to treat actinic keratoses. It works by interfering with the growth of abnormal fast-growing skin cells, such as actinic keratoses. These cells peel off and are replaced by healthy ones.   5-fluorouracil/calcipotriene is a combination of the 5-fluorouracil cream with a vitamin D analog cream called calcipotriene. The calcipotriene alone does not treat actinic keratoses. However, when it is combined with 5-fluorouracil, it helps the 5-fluorouracil treat the actinic keratoses much faster so that the same results can be achieved with a much shorter treatment time.  INSTRUCTIONS FOR 5-FLUOROURACIL/CALCIPOTRIENE CREAM:   5-fluorouracil/calcipotriene cream typically only needs to be used for 4-7 days. A thin layer should be applied twice a day to the treatment areas recommended by your physician.   If your physician prescribed you separate  tubes of 5-fluourouracil and calcipotriene, apply a thin layer of 5-fluorouracil followed by a thin layer of calcipotriene.   Avoid contact with your eyes, nostrils, and mouth. Do not use 5-fluorouracil/calcipotriene cream on infected or open wounds.   You will develop redness, irritation and some crusting at areas where you have pre-cancer damage/actinic keratoses. IF YOU DEVELOP PAIN, BLEEDING, OR SIGNIFICANT CRUSTING, STOP THE TREATMENT EARLY - you have already gotten a good response and the actinic keratoses should clear up well.  Wash your hands after applying 5-fluorouracil 5% cream on your skin.   A moisturizer or sunscreen with a minimum SPF 30 should be applied each morning.   Once you have finished the treatment, you can apply a thin layer of Vaseline twice a day to irritated areas to soothe and calm the areas more quickly. If you experience significant discomfort, contact your physician.  For some patients it is necessary to repeat the treatment for best results.  SIDE EFFECTS: When using 5-fluorouracil/calcipotriene cream, you may have mild irritation, such as redness, dryness, swelling, or a mild burning sensation. This usually resolves within 2 weeks. The more actinic keratoses you have, the more redness and inflammation you can expect during treatment. Eye irritation has been reported rarely. If this occurs, please let us know.  If you have any trouble using this cream, please call the office. If you have any other questions about this information, please do not hesitate to ask me before you leave the office.    Cryotherapy Aftercare  Wash gently with soap and water everyday.   Apply Vaseline and Band-Aid daily until healed.  If you have any questions or concerns for your doctor, please call our main line at [phone] and press option 4 to reach your doctor's medical assistant. If no one answers, please leave a voicemail as directed and we will return your call as soon  as possible. Messages left after 4 pm will be answered the following business day.   You may also send [place] a message via [place]. We typically respond to MyChart messages within 1-2 business days.  For prescription refills, please ask your pharmacy to contact our office. Our fax number is [phone].  If you have an urgent issue when the clinic is closed that cannot wait until the next business day, you can page your doctor at the number below.    Please note that while we do our best to be available for urgent issues outside of office hours, we are not available 24/7.   If you have an urgent issue and are unable to reach [place], you may choose to seek medical care at your doctor's office, retail clinic, urgent care center, or emergency room.  If you have a medical emergency, please immediately call 911 or go to the emergency department.  Pager Numbers  - Dr. Nehemiah Massed: [phone]  - Dr. Laurence Ferrari: [phone]  - Dr. Nicole Kindred: [phone]  In the event of inclement weather, please call our main line at [phone] for an update on the status of any delays or closures.  Dermatology Medication Tips: Please keep the boxes that topical medications come in in order to help keep track of the instructions about where and how to use these. Pharmacies typically print the medication instructions only on the boxes and not directly on the medication tubes.   If your medication is too expensive, please contact our office at [phone] option 4 or send [place] a message through [place].   We are unable to tell what your co-pay for medications will be in advance as this is different depending on your insurance coverage. However, we may be able to find a substitute medication at lower cost or fill out paperwork to get insurance to cover a needed medication.   If a prior authorization is required to get your medication covered by your insurance company, please allow [place] 1-2 business days to complete this  process.  Drug prices often vary depending on where the prescription is filled and some pharmacies may offer cheaper prices.  The website www.goodrx.com contains coupons for medications through different pharmacies. The prices here do not account for what the cost may be with help from insurance (it may be cheaper with your insurance), but the website can give you the price if you did not use any insurance.  - You can print the associated coupon and take it with your prescription to the pharmacy.  - You may also stop by our office during regular business hours and pick up a GoodRx coupon card.  - If you need your prescription sent electronically to a different pharmacy, notify our office through [facility] or by phone at [phone] option 4.

## 2021-06-21 ENCOUNTER — Encounter: Payer: Self-pay | Admitting: Dermatology

## 2021-06-25 DIAGNOSIS — E042 Nontoxic multinodular goiter: Secondary | ICD-10-CM | POA: Diagnosis not present

## 2021-08-21 ENCOUNTER — Ambulatory Visit: Payer: Medicare Other | Admitting: Internal Medicine

## 2021-08-21 ENCOUNTER — Ambulatory Visit: Payer: Medicare Other | Admitting: Dermatology

## 2021-09-02 ENCOUNTER — Encounter: Payer: Self-pay | Admitting: Emergency Medicine

## 2021-09-02 ENCOUNTER — Ambulatory Visit
Admission: EM | Admit: 2021-09-02 | Discharge: 2021-09-02 | Disposition: A | Discharge status: Home / Self Care | Payer: Medicare Other | Attending: Internal Medicine | Admitting: Internal Medicine

## 2021-09-02 ENCOUNTER — Other Ambulatory Visit: Payer: Self-pay

## 2021-09-02 ENCOUNTER — Ambulatory Visit (INDEPENDENT_AMBULATORY_CARE_PROVIDER_SITE_OTHER): Payer: Medicare Other

## 2021-09-02 DIAGNOSIS — J069 Acute upper respiratory infection, unspecified: Secondary | ICD-10-CM | POA: Diagnosis not present

## 2021-09-02 DIAGNOSIS — R059 Cough, unspecified: Secondary | ICD-10-CM

## 2021-09-02 DIAGNOSIS — J9811 Atelectasis: Secondary | ICD-10-CM | POA: Diagnosis not present

## 2021-09-02 LAB — POCT RAPID STREP A (OFFICE): Rapid Strep A Screen: NEGATIVE

## 2021-09-02 LAB — POCT INFLUENZA A/B
Influenza A, POC: NEGATIVE
Influenza B, POC: NEGATIVE

## 2021-09-02 IMAGING — DX DG CHEST 2V
2 series · 2 of 2 positions shown · non-contrast
Comparison: None.

CLINICAL DATA: Cough and body aches

EXAM:
CHEST - 2 VIEW

[chest pa]
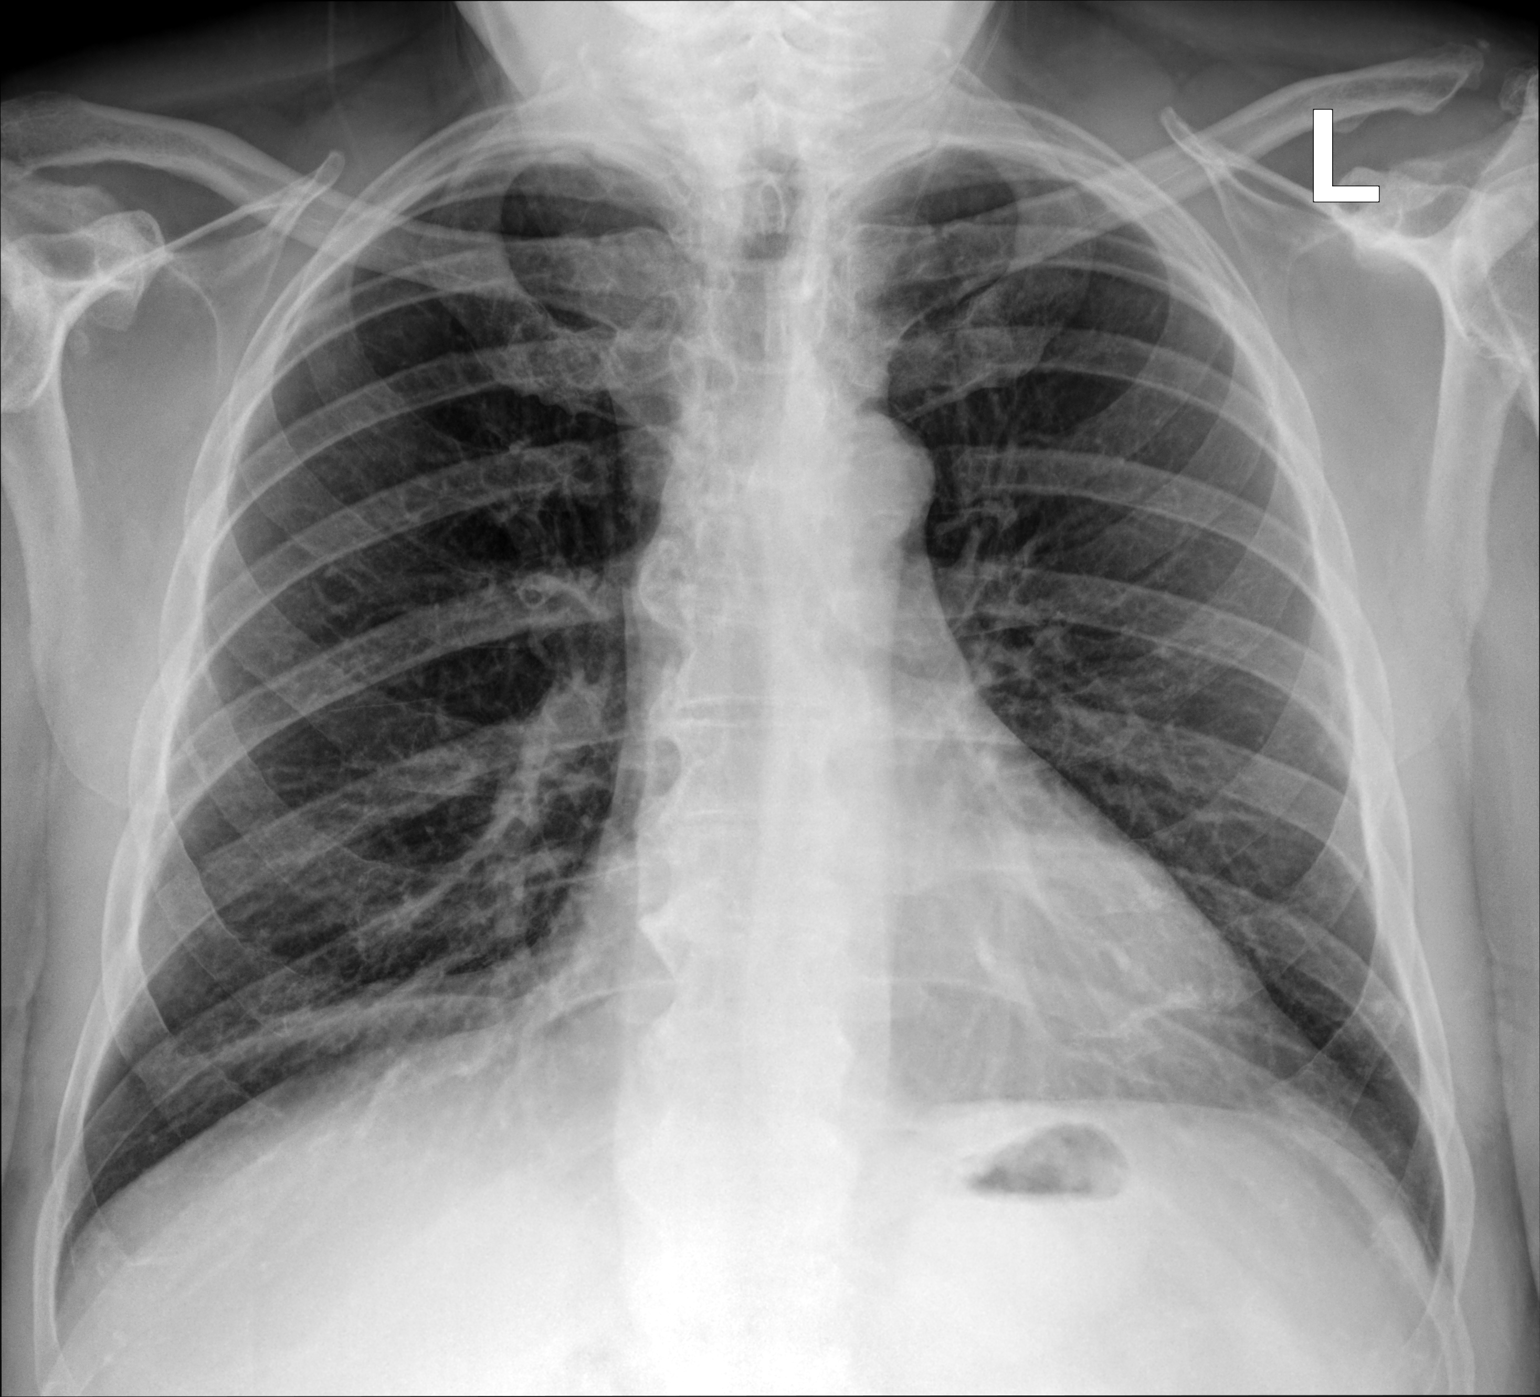

[chest lat]
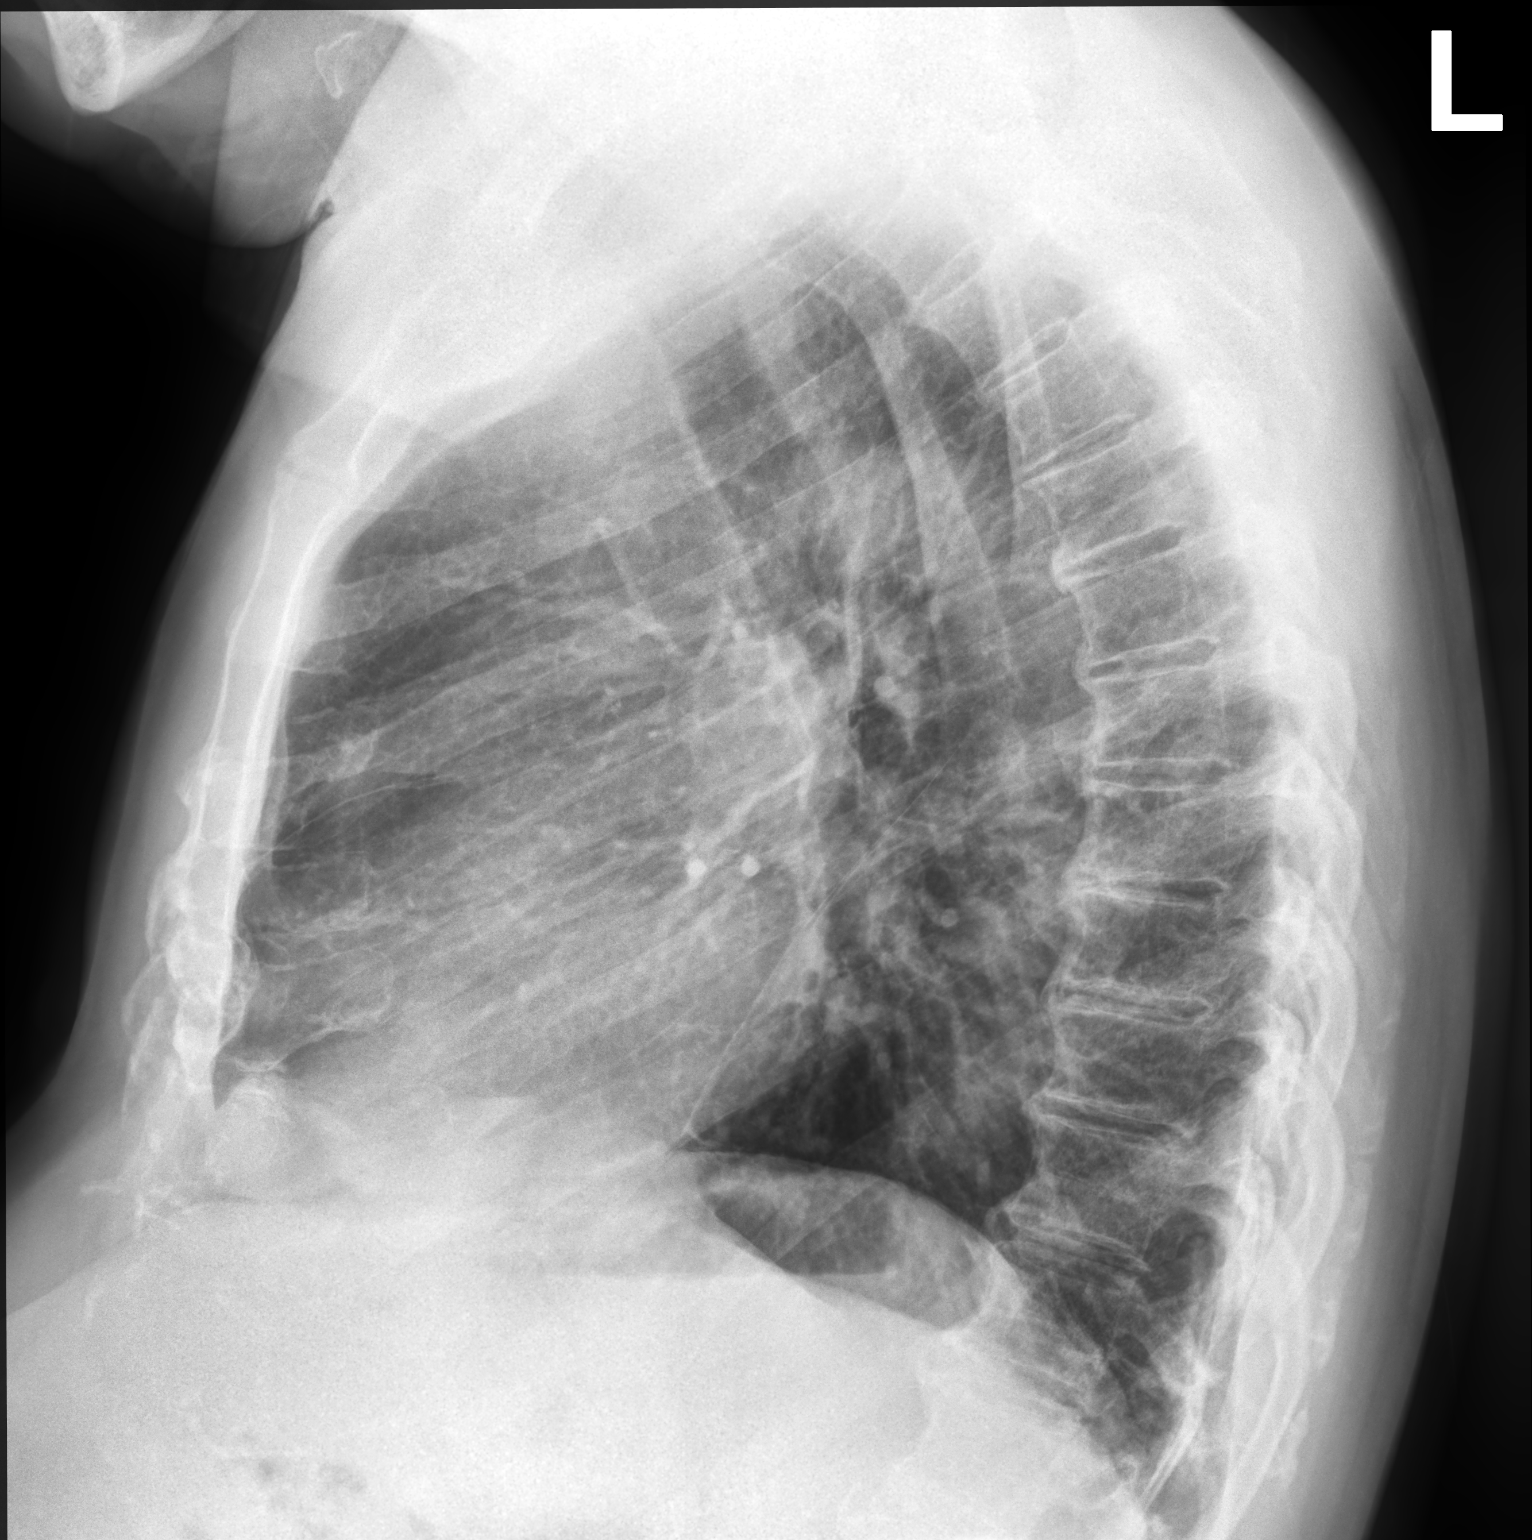

[2 of 2 positions shown; findings below may reference images not displayed]

FINDINGS: The heart size and mediastinal contours are within normal limits.
Subsegmental linear atelectasis in the right lung base. Lungs
otherwise clear. The visualized skeletal structures are
unremarkable.
IMPRESSION: No active cardiopulmonary disease.

## 2021-09-02 MED ORDER — BENZONATATE 200 MG PO CAPS: 200.0000 mg | ORAL_CAPSULE | Freq: Three times a day (TID) | ORAL | 0 refills | Status: DC | PRN

## 2021-09-02 NOTE — Discharge Instructions (Addendum)
Your flu test, strep test and chest xray are all negative You have a cold which could last up to 2 weeks, but should improve after 10 days and not get worse or develop a fever. If that happens, then you need to be seen again.

## 2021-09-02 NOTE — ED Triage Notes (Signed)
Pt c/o deep cough for a couple of days began to get worse last night. Pt states his chest hurts when he coughs and he has body aches.

## 2021-09-02 NOTE — ED Provider Notes (Signed)
[facility]    CSN: [id number] Arrival date & time: 09/02/21  0945      History   Chief Complaint Chief Complaint  Patient presents with   Cough    HPI LEM PEARY is a 70 y.o. male presents today due to having a cough x 2 days. Is getting worse at night time. His chest hurts to cough and has starting to get body aches yesterday. He just returned from [place] this past weekend. His cough is mildly productive. Has not had much rhinitis or stuffy nose.  His appetite and energy have been normal. Has mild HA today. Denies being sick in the past month.     Past Medical History:  Diagnosis Date   Actinic keratosis    Allergy    Anemia    Arthritis    Basal cell carcinoma    Cancer (HCC)    BASAL CELL-FACE   Complication of anesthesia    PT IS A RED HEAD AND STATES IT TAKES ALOT MORE ANESTHESIA TO SEDATE HIM   Diabetes mellitus without complication (HCC)    Dysrhythmia    SVT   Fatty liver    GERD (gastroesophageal reflux disease)    Hyperlipidemia    Melanoma ([place]) 02/02/2019   Left forearm. Superficial spreading. Tumor thickness 0.25mm, anatomic level III. Excised: 02/22/2019, margins free.   Multiple thyroid nodules    Rheumatic fever    Sleep apnea 1997   Uses C-Pap machine   SVT (supraventricular tachycardia) (HCC)    Thyroid nodule     Patient Active Problem List   Diagnosis Date Noted   IPMN (intraductal papillary mucinous neoplasm) 05/14/2021   Kidney cysts 05/13/2021   Pancreatic cyst 05/13/2021   Gallstone pancreatitis 04/30/2021   Cholecystitis    Abnormal MRI, lumbar spine 02/17/2021   Iron deficiency 12/30/2020   Statin intolerance 08/23/2020   Hypertension associated with diabetes ([place]) 07/24/2020   Statin myopathy 03/20/2020   Type 2 diabetes mellitus with hyperglycemia, without long-term current use of insulin ([place]) 12/19/2019   Elevated PSA 06/13/2019   Iron deficiency anemia 06/13/2019   Basal cell carcinoma of skin 03/02/2019    Melanoma of skin ([place]) 02/21/2019   AK (actinic keratosis) 11/23/2018   Vertigo 11/23/2018   Allergic rhinitis 07/04/2018   Fatty liver 03/03/2018   Bradycardia 03/03/2018   Fatigue 01/23/2018   Benign prostatic hyperplasia without lower urinary tract symptoms 01/23/2018   Type 2 diabetes mellitus without complication, without long-term current use of insulin ([place]) 01/23/2018   Thyroid nodule 01/20/2018   Hyperlipidemia 11/26/2016   DM type 2 with diabetic peripheral neuropathy ([place]) 05/27/2016   Paroxysmal SVT (supraventricular tachycardia) ([place]) 05/27/2016   GERD (gastroesophageal reflux disease) 05/27/2016   OSA (obstructive sleep apnea) 05/27/2016    Past Surgical History:  Procedure Laterality Date   [place] Right 05/2015   COLONOSCOPY  2016   LUMBAR FUSION  2015   L4/L5   [place] Medications    Prior to Admission medications   Medication Sig Start Date End Date Taking? Authorizing Provider  azelastine (ASTELIN) 0.1 % nasal spray Place 1 spray into both nostrils 2 (two) times daily as needed for rhinitis or allergies. Use in each nostril as directed 05/02/21  Yes Enzo Bi, MD  benzonatate (TESSALON) 200 MG capsule Take 1 capsule (200 mg total) by mouth  3 (three) times daily as needed for cough. 09/02/21  Yes Rodriguez-Southworth, Sunday Spillers, PA-C  cholecalciferol (VITAMIN D3) 25 MCG (1000 UT) tablet Take 1,000 Units by mouth daily.   Yes [provider]  Coenzyme Q10 (COQ10) 200 MG CAPS Take 200 mg by mouth daily.    Yes [provider]  Dulaglutide (TRULICITY) 3.23 FT/7.3UK SOPN Inject 0.75 mg into the skin every Tuesday. 05/06/21  Yes Enzo Bi, MD  ibuprofen (ADVIL) 600 MG tablet Take 1 tablet (600 mg total) by mouth every 6 (six) hours as needed. 05/02/21  Yes Edison Simon R, PA-C  metoprolol succinate (TOPROL-XL) 50 MG 24 hr tablet TAKE 1 TABLET DAILY WITH OR  IMMEDIATELY FOLLOWING A MEAL (DOSE CHANGE) 12/02/20  Yes [name], [name], MD  Multiple Vitamins-Minerals (MULTIVITAMIN ADULT PO) Take 1 tablet by mouth daily.    Yes [provider]  Omega-3 Fatty Acids (FISH OIL) 1200 MG CPDR Take 1 tablet by mouth daily.   Yes [provider]  omeprazole (PRILOSEC) 40 MG capsule Take 1 capsule (40 mg total) by mouth at bedtime. 05/02/21  Yes Enzo Bi, MD  Red Yeast Rice 600 MG TABS Take 600 mg by mouth 2 (two) times daily with a meal.   Yes [provider]  tamsulosin (FLOMAX) 0.4 MG CAPS capsule Take 1 capsule (0.4 mg total) by mouth at bedtime. 05/02/21  Yes Enzo Bi, MD  vitamin C (ASCORBIC ACID) 500 MG tablet Take 1,000 mg by mouth daily.    Yes [provider]  Vitamin E 400 units TABS Take 400 Units by mouth daily.    Yes [provider]  cetirizine (ZYRTEC) 10 MG tablet Take 10 mg by mouth at bedtime.     [provider]  Continuous Blood Gluc Sensor (FREESTYLE LIBRE 2 SENSOR) MISC Use to check glucose Q8H 10/11/20   [name], [name], MD  metFORMIN (GLUCOPHAGE) 1000 MG tablet TAKE 1 TABLET TWICE A DAY WITH MEALS 03/04/21   [name], [name], MD  triamcinolone ointment (KENALOG) 0.1 % Apply 1 application topically daily. As needed and cover with bandaid. Avoid face, groin, underarms. 01/28/21   Alfonso Patten, MD    Family History Family History  Problem Relation Age of Onset   Arthritis Mother    Lung cancer Mother    Hyperlipidemia Mother    Diabetes Mother    Cholelithiasis Mother        gb removed   Arthritis Father    Diabetes Father    Lung disease Father    Pancreatitis Father        died age 59 pancreatitis   Cholelithiasis Brother        GB removed   Hyperlipidemia Maternal Grandmother    Alcohol abuse Maternal Grandfather    Hyperlipidemia Maternal Grandfather    Drug abuse Cousin    Hyperlipidemia Other        maternal side of family    Cholelithiasis  Daughter        GB removed    Social History Social History   Tobacco Use   Smoking status: Never   Smokeless tobacco: Former    Types: Snuff  Vaping Use   Vaping Use: Never used  Substance Use Topics   Alcohol use: Yes    Alcohol/week: 1.0 - 2.0 standard drink    Types: 1 - 2 Cans of beer per week    Comment: OCC   Drug use: No     Allergies   Januvia [  sitagliptin], Lipitor [atorvastatin], and Statins   Review of Systems Review of Systems  Constitutional:  Negative for activity change, appetite change, chills, diaphoresis, fatigue and fever.  HENT:  Positive for congestion and postnasal drip. Negative for ear discharge, ear pain, sore throat and trouble swallowing.   Eyes:  Negative for discharge.  Respiratory:  Positive for cough. Negative for chest tightness.   Cardiovascular:  Positive for chest pain.  Musculoskeletal:  Positive for myalgias.  Skin:  Negative for rash.  Neurological:  Positive for headaches.  Hematological:  Negative for adenopathy.    Physical Exam Triage Vital Signs ED Triage Vitals  Enc Vitals Group     BP 09/02/21 0951 139/81     Pulse Rate 09/02/21 0951 79     Resp 09/02/21 0951 18     Temp 09/02/21 0951 98.8 F (37.1 C)     Temp Source 09/02/21 0951 Oral     SpO2 09/02/21 0951 95 %     Weight --      Height --      Head Circumference --      Peak Flow --      Pain Score 09/02/21 0953 2     Pain Loc --      Pain Edu? --      Excl. in [place]? --    No data found.  Updated Vital Signs BP 139/81 (BP Location: Left Arm)   Pulse 79   Temp 98.8 F (37.1 C) (Oral)   Resp 18   SpO2 95%   Visual Acuity Right Eye Distance:   Left Eye Distance:   Bilateral Distance:    Right Eye Near:   Left Eye Near:    Bilateral Near:     Physical Exam Physical Exam Vitals signs and nursing note reviewed.  Constitutional:      General: he is not in acute distress.    Appearance: Normal appearance, he is not ill-appearing, toxic-appearing  or diaphoretic.  HENT:     Head: Normocephalic.     Right Ear: Tympanic membrane, ear canal and external ear normal.     Left Ear: Tympanic membrane, ear canal and external ear normal.     Nose: Nose normal.     Mouth/Throat:     Mouth: Mucous membranes are moist.  Eyes:     General: No scleral icterus.       Right eye: No discharge.        Left eye: No discharge.     Conjunctiva/sclera: Conjunctivae normal.  Neck:     Musculoskeletal: Neck supple. No neck rigidity.  Cardiovascular:     Rate and Rhythm: Normal rate and regular rhythm.     Heart sounds: No murmur.  Pulmonary:     Effort: Pulmonary effort is normal.     Breath sounds: Normal breath sounds.  AMusculoskeletal: Normal range of motion.  Lymphadenopathy:     Cervical: No cervical adenopathy.  Skin:    General: Skin is warm and dry.     Coloration: Skin is not jaundiced.     Findings: No rash.  Neurological:     Mental Status: he is alert and oriented to person, place, and time.     Gait: Gait normal.  Psychiatric:        Mood and Affect: Mood normal.        Behavior: Behavior normal.        Thought Content: Thought content normal.        Judgment: Judgment  normal.    UC Treatments / Results  Labs (all labs ordered are listed, but only abnormal results are displayed) Labs Reviewed  POCT RAPID STREP A (OFFICE)  POCT INFLUENZA A/B   Flu A& B and rapid strep are neg EKG   Radiology DG Chest 2 View  Result Date: 09/02/2021 CLINICAL DATA:  Cough and body aches EXAM: CHEST - 2 VIEW COMPARISON:  None. FINDINGS: The heart size and mediastinal contours are within normal limits. Subsegmental linear atelectasis in the right lung base. Lungs otherwise clear. The visualized skeletal structures are unremarkable. IMPRESSION: No active cardiopulmonary disease. Electronically Signed   By: Keane Police D.O.   On: 09/02/2021 10:32    Procedures Procedures (including critical care time)  Medications Ordered in  UC Medications - No data to display  Initial Impression / Assessment and Plan / UC Course  I have reviewed the triage vital signs and the nursing notes. Pertinent labs & imaging results that were available during my care of the patient were reviewed by me and considered in my medical decision making (see chart for details). Has URI. I sent Tessalon prn cough as noted.  See instructions    Final Clinical Impressions(s) / UC Diagnoses   Final diagnoses:  Acute upper respiratory infection     Discharge Instructions      Your flu test, strep test and chest xray are all negative You have a cold which could last up to 2 weeks, but should improve after 10 days and not get worse or develop a fever. If that happens, then you need to be seen again.      ED Prescriptions     Medication Sig Dispense Auth. Provider   benzonatate (TESSALON) 200 MG capsule Take 1 capsule (200 mg total) by mouth 3 (three) times daily as needed for cough. 30 capsule Rodriguez-Southworth, Sunday Spillers, PA-C      PDMP not reviewed this encounter.   Shelby Mattocks, PA-C 09/02/21 1050

## 2021-09-08 ENCOUNTER — Ambulatory Visit
Admission: RE | Admit: 2021-09-08 | Discharge: 2021-09-08 | Disposition: A | Discharge status: Home / Self Care | Payer: Medicare Other | Source: Ambulatory Visit | Attending: Urology | Admitting: Urology

## 2021-09-08 ENCOUNTER — Other Ambulatory Visit: Payer: Self-pay

## 2021-09-08 DIAGNOSIS — N189 Chronic kidney disease, unspecified: Secondary | ICD-10-CM | POA: Diagnosis not present

## 2021-09-08 DIAGNOSIS — R339 Retention of urine, unspecified: Secondary | ICD-10-CM

## 2021-09-08 DIAGNOSIS — N281 Cyst of kidney, acquired: Secondary | ICD-10-CM | POA: Diagnosis not present

## 2021-09-08 IMAGING — US US RENAL
1 series · 13 of 25 positions shown · non-contrast
Comparison: MRI [DATE]

CLINICAL DATA: Chronic kidney disease, incomplete bladder emptying

EXAM:
RENAL / URINARY TRACT ULTRASOUND COMPLETE

[Series 1: us renal · 13 of 57 slices shown]
[im 1/57]
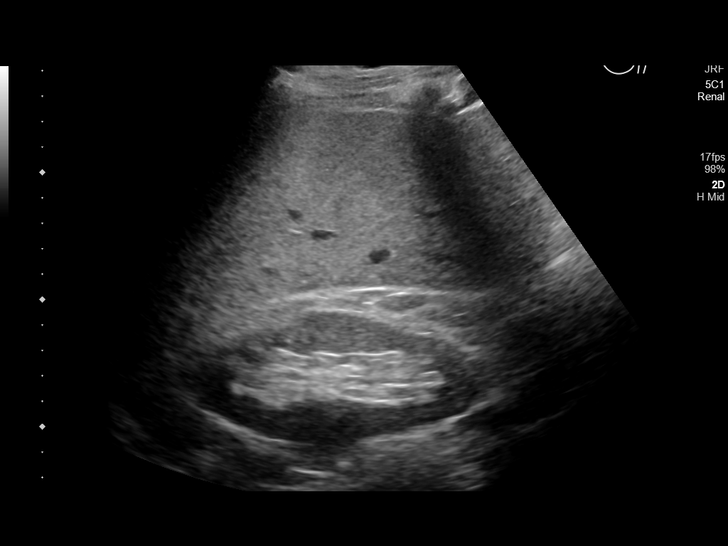
[im 5/57]
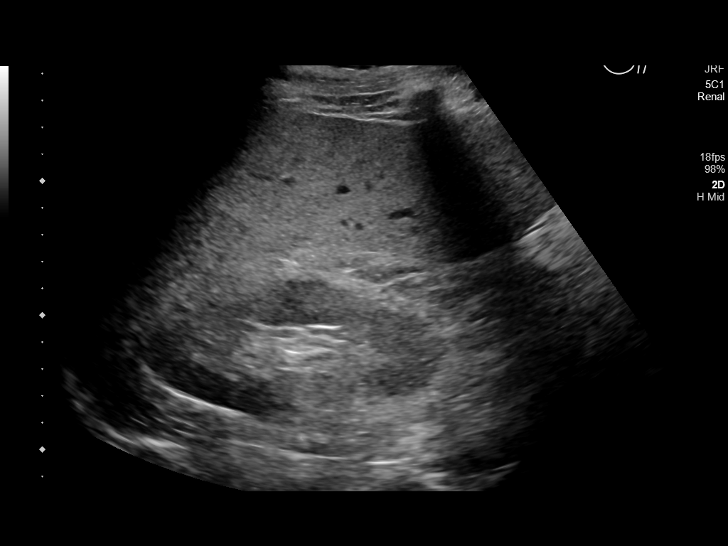
[im 10/57]
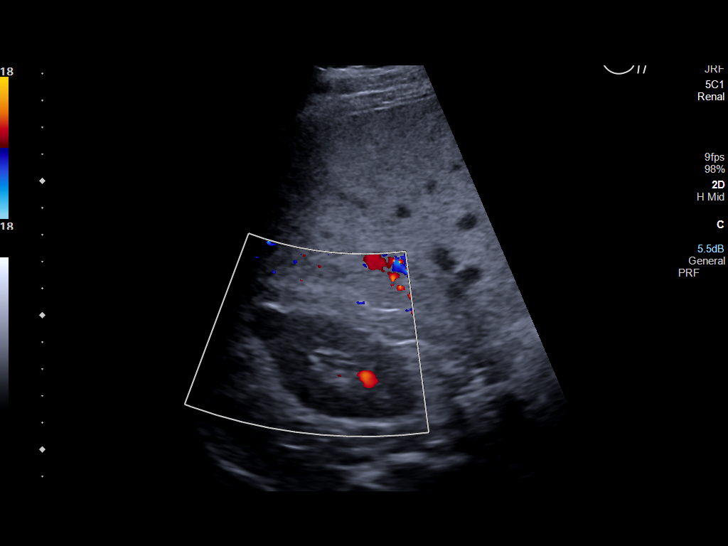
[im 15/57]
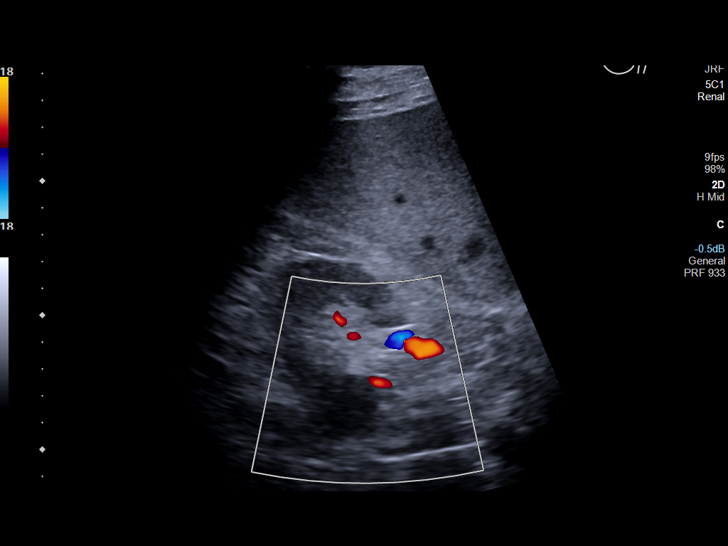
[im 19/57]
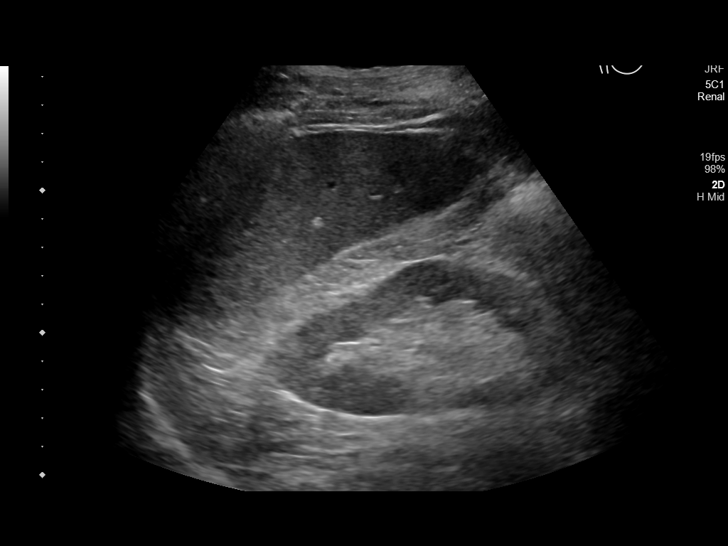
[im 24/57]
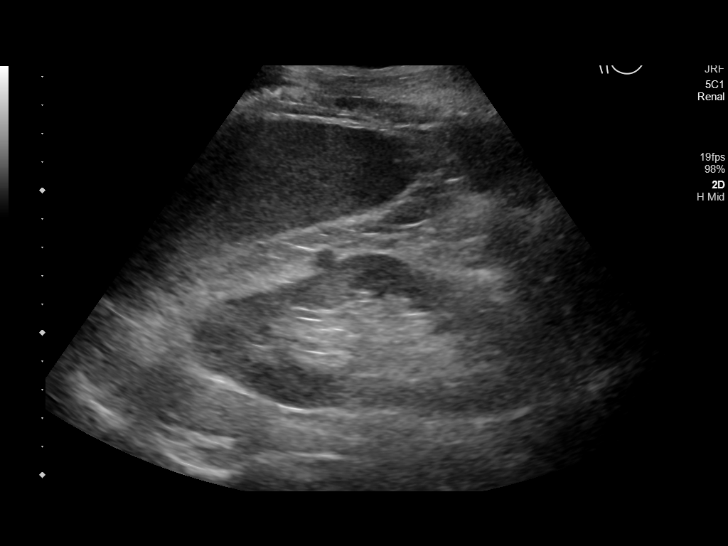
[im 29/57]
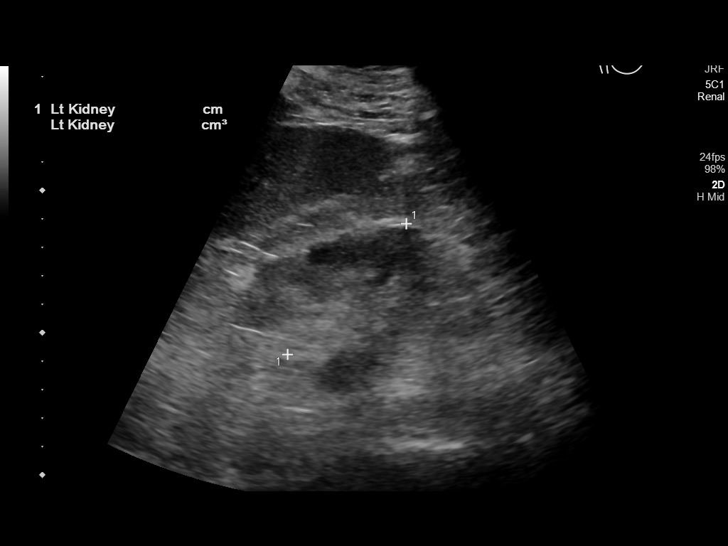
[im 33/57]
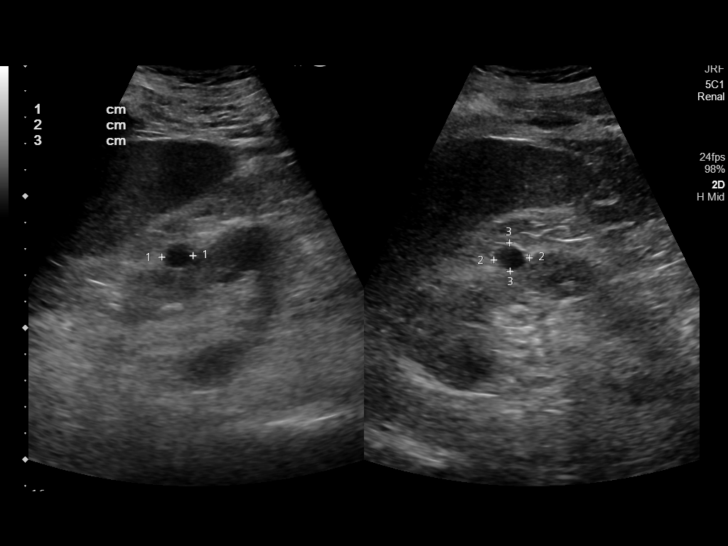
[im 38/57]
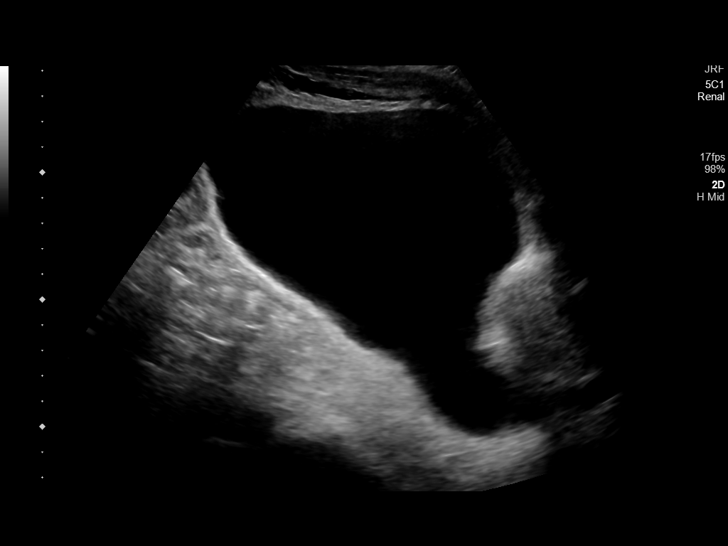
[im 43/57]
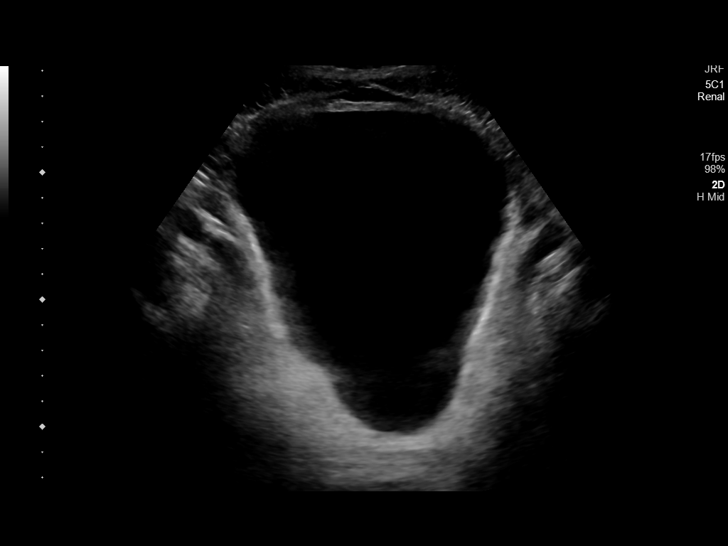
[im 47/57]
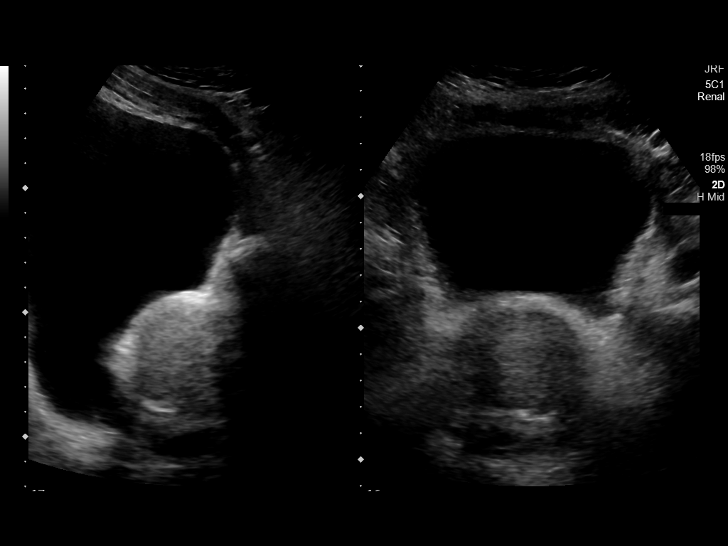
[im 52/57]
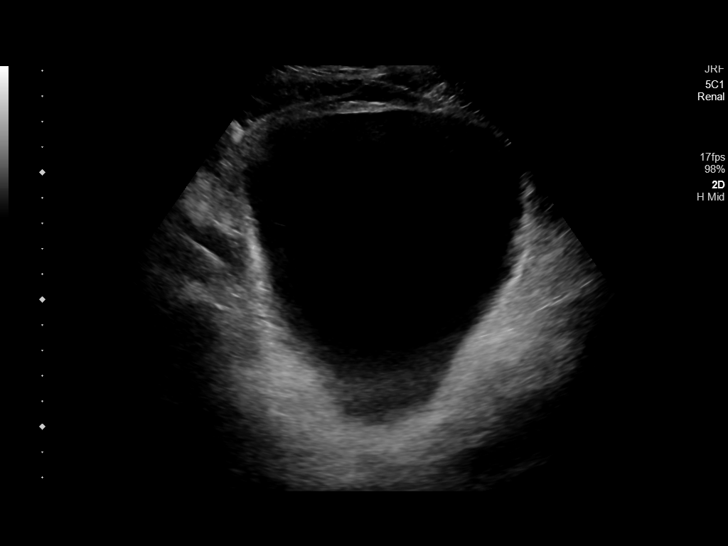
[im 57/57]
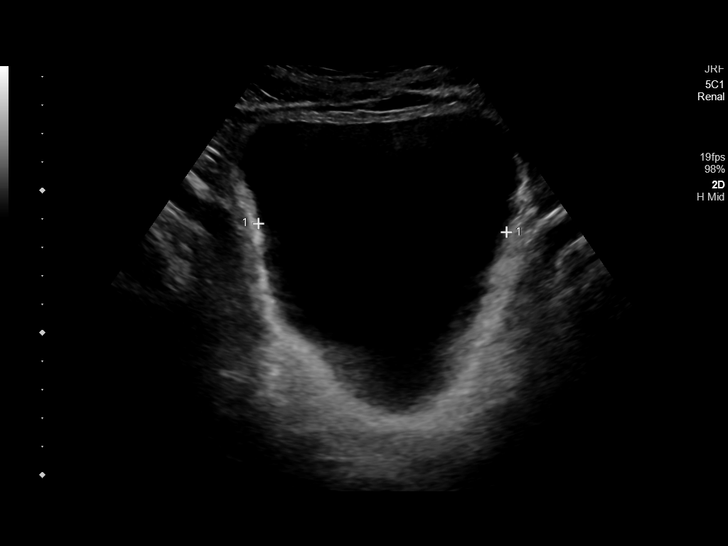

[13 of 25 positions shown; findings below may reference images not displayed]

FINDINGS: Right Kidney:

Renal measurements: 11.5 x 5.9 x 6.1 cm = volume: 215 mL.
Echogenicity within normal limits. No mass or hydronephrosis
visualized. While not optimally characterized, due to depth of the
lesions, previously noted renal cysts appears stable measuring 1.6 x
1.5 x 1.4 cm within the upper pole and 2.8 x 2.2 x 2.8 cm within the
interpolar region.

Left Kidney:

Renal measurements: 12.3 x 5.5 x 6.2 cm = volume: 221 mL.
Echogenicity within normal limits. No mass or hydronephrosis
visualized. Exophytic simple cortical cyst is seen measuring 1.4 x
1.1 x 1.2 cm.

Bladder:

The bladder is distended with an initial volume of 995 cc and a
large postvoid residual of 537 cc. The prostate gland is moderately
enlarged measuring 77 cc in total volume.

Other:

Hepatic steatosis noted.
IMPRESSION: Stable renal cysts when compared to prior MRI examination, not
optimally characterized on this exam due to depth of the lesion.

No hydronephrosis.

Marked distention of the bladder with large postvoid residual as
noted above. Moderate prostatic enlargement may contribute to
bladder outlet obstruction.

Hepatic steatosis.

## 2021-09-09 ENCOUNTER — Ambulatory Visit: Payer: Medicare Other | Admitting: Internal Medicine

## 2021-09-09 ENCOUNTER — Telehealth: Payer: Self-pay | Admitting: Internal Medicine

## 2021-09-09 NOTE — Telephone Encounter (Signed)
Patient no-showed today's appointment; appointment was for 09/09/21, provider notified for review of record. Letter sent for patient to call in and re-schedule.

## 2021-09-10 ENCOUNTER — Other Ambulatory Visit: Payer: Self-pay

## 2021-09-10 ENCOUNTER — Ambulatory Visit: Payer: Medicare Other | Admitting: Dermatology

## 2021-09-10 ENCOUNTER — Ambulatory Visit: Payer: Medicare Other

## 2021-09-17 ENCOUNTER — Other Ambulatory Visit: Payer: Self-pay

## 2021-09-17 ENCOUNTER — Ambulatory Visit (INDEPENDENT_AMBULATORY_CARE_PROVIDER_SITE_OTHER): Payer: Medicare Other | Admitting: Urology

## 2021-09-17 ENCOUNTER — Encounter: Payer: Self-pay | Admitting: Urology

## 2021-09-17 VITALS — BP 127/78 | HR 59 | Ht 73.0 in | Wt 228.0 lb

## 2021-09-17 DIAGNOSIS — N1330 Unspecified hydronephrosis: Secondary | ICD-10-CM

## 2021-09-17 DIAGNOSIS — R339 Retention of urine, unspecified: Secondary | ICD-10-CM | POA: Diagnosis not present

## 2021-09-17 DIAGNOSIS — N400 Enlarged prostate without lower urinary tract symptoms: Secondary | ICD-10-CM | POA: Diagnosis not present

## 2021-09-17 LAB — BLADDER SCAN AMB NON-IMAGING

## 2021-09-17 MED ORDER — TAMSULOSIN HCL 0.4 MG PO CAPS: 0.4000 mg | ORAL_CAPSULE | Freq: Every day | ORAL | 3 refills | Status: DC

## 2021-09-17 NOTE — Patient Instructions (Signed)

## 2021-09-17 NOTE — Addendum Note (Signed)
Addended by: Despina Hidden on: 09/17/2021 02:08 PM   Modules accepted: Orders

## 2021-09-17 NOTE — Progress Notes (Signed)
   09/17/2021 1:53 PM   Jackson [name] Oct 04, 1951 [id number]  Reason for visit: Follow up elevated PSA, BPH, incomplete bladder emptying  HPI: 70 year old male with a long history of mildly elevated PSA ranging from 3-4.9 over the last few years, and has been stable with a reassuring greater than 20% free, and DRE has been benign.  He has had worsening urinary symptoms with feeling of incomplete emptying and weak stream, and PVR has been elevated in the past greater than 250 mL.  He was started on Flomax with some improvement in the urinary symptoms, but PVRs remain elevated.  I recommended seriously considering an outlet procedure with his persistently elevated PVRs but he deferred.  I personally viewed and interpreted the renal/bladder ultrasound dated 09/08/2021 that shows a 77 g prostate, with significant postvoid residual 537 mL, and normal kidneys with simple cysts and no hydronephrosis.  Renal function normal with creatinine 1.13, EGFR greater than 60.  He continues to be minimally bothered by his urinary symptoms, with primary complaint of weak stream and feeling of incomplete emptying.  He has nocturia 0-1 time overnight.  No significant incontinence.  PVR today mildly elevated at 172 mL, improved from his prior values.  We again reviewed outlet procedures like HOLEP or UroLift, but he would like to continue Flomax at this time.  Return precautions discussed extensively including gross hematuria, urinary retention, UTIs, worsening renal function, worsening urinary symptoms.  Refill Flomax RTC 1 year PSA reflex to free, PVR, renal/bladder ultrasound prior   Billey Co, MD  [facility] [address] [place], [place] [address] [phone]

## 2021-10-01 ENCOUNTER — Other Ambulatory Visit: Payer: Self-pay

## 2021-10-01 ENCOUNTER — Ambulatory Visit (INDEPENDENT_AMBULATORY_CARE_PROVIDER_SITE_OTHER): Payer: Medicare Other | Admitting: Dermatology

## 2021-10-01 DIAGNOSIS — L578 Other skin changes due to chronic exposure to nonionizing radiation: Secondary | ICD-10-CM | POA: Diagnosis not present

## 2021-10-01 DIAGNOSIS — L821 Other seborrheic keratosis: Secondary | ICD-10-CM | POA: Diagnosis not present

## 2021-10-01 DIAGNOSIS — D229 Melanocytic nevi, unspecified: Secondary | ICD-10-CM

## 2021-10-01 DIAGNOSIS — D1801 Hemangioma of skin and subcutaneous tissue: Secondary | ICD-10-CM | POA: Diagnosis not present

## 2021-10-01 DIAGNOSIS — L570 Actinic keratosis: Secondary | ICD-10-CM

## 2021-10-01 DIAGNOSIS — Z85828 Personal history of other malignant neoplasm of skin: Secondary | ICD-10-CM | POA: Diagnosis not present

## 2021-10-01 DIAGNOSIS — Z85820 Personal history of malignant melanoma of skin: Secondary | ICD-10-CM | POA: Diagnosis not present

## 2021-10-01 DIAGNOSIS — L814 Other melanin hyperpigmentation: Secondary | ICD-10-CM | POA: Diagnosis not present

## 2021-10-01 DIAGNOSIS — Z1283 Encounter for screening for malignant neoplasm of skin: Secondary | ICD-10-CM

## 2021-10-01 DIAGNOSIS — L738 Other specified follicular disorders: Secondary | ICD-10-CM | POA: Diagnosis not present

## 2021-10-01 NOTE — Patient Instructions (Addendum)
Recommend taking Heliocare sun protection supplement daily in sunny weather for additional sun protection. For maximum protection on the sunniest days, you can take up to 2 capsules of regular Heliocare OR take 1 capsule of Heliocare Ultra. For prolonged exposure (such as a full day in the sun), you can repeat your dose of the supplement 4 hours after your first dose. Heliocare can be purchased at [facility] or at [email or web address].    Melanoma ABCDEs  Melanoma is the most dangerous type of skin cancer, and is the leading cause of death from skin disease.  You are more likely to develop melanoma if you: Have light-colored skin, light-colored eyes, or red or blond hair Spend a lot of time in the sun Tan regularly, either outdoors or in a tanning bed Have had blistering sunburns, especially during childhood Have a close family member who has had a melanoma Have atypical moles or large birthmarks  Early detection of melanoma is key since treatment is typically straightforward and cure rates are extremely high if we catch it early.   The first sign of melanoma is often a change in a mole or a new dark spot.  The ABCDE system is a way of remembering the signs of melanoma.  A for asymmetry:  The two halves do not match. B for border:  The edges of the growth are irregular. C for color:  A mixture of colors are present instead of an even brown color. D for diameter:  Melanomas are usually (but not always) greater than 91mm - the size of a pencil eraser. E for evolution:  The spot keeps changing in size, shape, and color.  Please check your skin once per month between visits. You can use a small mirror in front and a large mirror behind you to keep an eye on the back side or your body.   If you see any new or changing lesions before your next follow-up, please call to schedule a visit.  Please continue daily skin protection including broad spectrum sunscreen SPF 30+ to sun-exposed areas,  reapplying every 2 hours as needed when you're outdoors.    If You Need Anything After Your Visit  If you have any questions or concerns for your doctor, please call our main line at [phone] and press option 4 to reach your doctor's medical assistant. If no one answers, please leave a voicemail as directed and we will return your call as soon as possible. Messages left after 4 pm will be answered the following business day.   You may also send [place] a message via [place]. We typically respond to MyChart messages within 1-2 business days.  For prescription refills, please ask your pharmacy to contact our office. Our fax number is [phone].  If you have an urgent issue when the clinic is closed that cannot wait until the next business day, you can page your doctor at the number below.    Please note that while we do our best to be available for urgent issues outside of office hours, we are not available 24/7.   If you have an urgent issue and are unable to reach [place], you may choose to seek medical care at your doctor's office, retail clinic, urgent care center, or emergency room.  If you have a medical emergency, please immediately call 911 or go to the emergency department.  Pager Numbers  - Dr. Nehemiah Massed: [phone]  - Dr. Laurence Ferrari: [phone]  - Dr. Nicole Kindred: [phone]  In the event  of inclement weather, please call our main line at [phone] for an update on the status of any delays or closures.  Dermatology Medication Tips: Please keep the boxes that topical medications come in in order to help keep track of the instructions about where and how to use these. Pharmacies typically print the medication instructions only on the boxes and not directly on the medication tubes.   If your medication is too expensive, please contact our office at [phone] option 4 or send [place] a message through [place].   We are unable to tell what your co-pay for medications will be in advance  as this is different depending on your insurance coverage. However, we may be able to find a substitute medication at lower cost or fill out paperwork to get insurance to cover a needed medication.   If a prior authorization is required to get your medication covered by your insurance company, please allow [place] 1-2 business days to complete this process.  Drug prices often vary depending on where the prescription is filled and some pharmacies may offer cheaper prices.  The website www.goodrx.com contains coupons for medications through different pharmacies. The prices here do not account for what the cost may be with help from insurance (it may be cheaper with your insurance), but the website can give you the price if you did not use any insurance.  - You can print the associated coupon and take it with your prescription to the pharmacy.  - You may also stop by our office during regular business hours and pick up a GoodRx coupon card.  - If you need your prescription sent electronically to a different pharmacy, notify our office through [facility] or by phone at [phone] option 4.     Si Usted Necesita Algo Despus de Su Visita  Tambin puede enviarnos un mensaje a travs de Pharmacist, community. Por lo general respondemos a los mensajes de MyChart en el transcurso de 1 a 2 das hbiles.  Para renovar recetas, por favor pida a su farmacia que se ponga en contacto con nuestra oficina. Harland Dingwall de fax es [place] [address] [phone].  Si tiene un asunto urgente cuando la clnica est cerrada y que no puede esperar hasta el siguiente da hbil, puede llamar/localizar a su doctor(a) al nmero que aparece a continuacin.   Por favor, tenga en cuenta que aunque hacemos todo lo posible para estar disponibles para asuntos urgentes fuera del horario de [place], no estamos disponibles las 24 horas del da, los 7 das de la [place].   Si tiene un problema urgente y no puede comunicarse con nosotros, puede optar  por buscar atencin mdica  en el consultorio de su doctor(a), en una clnica privada, en un centro de atencin urgente o en una sala de emergencias.  Si tiene Engineering geologist, por favor llame inmediatamente al 911 o vaya a la sala de emergencias.  Nmeros de bper  - Dr. Nehemiah Massed: [phone]  - Dra. Moye: [phone]  - Dra. Nicole Kindred: [phone]  En caso de inclemencias del [place], por favor llame a Johnsie Kindred principal al [phone] para una actualizacin sobre el [place] de cualquier retraso o cierre.  Consejos para la medicacin en dermatologa: Por favor, guarde las cajas en las que vienen los medicamentos de uso tpico para ayudarle a seguir las instrucciones sobre dnde y cmo usarlos. Las farmacias generalmente imprimen las instrucciones del medicamento slo en las cajas y no directamente en los tubos del [place].   Si su medicamento  es muy caro, por favor, pngase en contacto con Zigmund Daniel llamando al [phone] y presione la opcin 4 o envenos un mensaje a travs de Pharmacist, community.   No podemos decirle cul ser su copago por los medicamentos por adelantado ya que esto es diferente dependiendo de la cobertura de su seguro. Sin embargo, es posible que podamos encontrar un medicamento sustituto a Electrical engineer un formulario para que el seguro cubra el medicamento que se considera necesario.   Si se requiere una autorizacin previa para que su compaa de seguros [place] su medicamento, por favor permtanos de 1 a 2 das hbiles para completar este proceso.  Los precios de los medicamentos varan con frecuencia dependiendo del Environmental consultant de dnde se surte la receta y alguna farmacias pueden ofrecer precios ms baratos.  El sitio web www.goodrx.com tiene cupones para medicamentos de Airline pilot. Los precios aqu no tienen en cuenta lo que podra costar con la ayuda del seguro (puede ser ms barato con su seguro), pero el sitio web puede darle el precio si  no utiliz Research scientist (physical sciences).  - Puede imprimir el cupn correspondiente y llevarlo con su receta a la farmacia.  - Tambin puede pasar por nuestra oficina durante el horario de atencin regular y Charity fundraiser una tarjeta de cupones de GoodRx.  - Si necesita que su receta se enve electrnicamente a una farmacia diferente, informe a nuestra oficina a travs de MyChart de [place] o por telfono llamando al [phone] y presione la opcin 4.

## 2021-10-01 NOTE — Progress Notes (Signed)
Follow-Up Visit   Subjective  Jackson [name] is a 70 y.o. male who presents for the following: FBSE (Patient here for full body skin exam and skin cancer screening. Patient with hx of melanoma and BCC. Patient advises he is not aware of any new or changing spots. ).   The following portions of the chart were reviewed this encounter and updated as appropriate:   Tobacco  Allergies  Meds  Problems  Med Hx  Surg Hx  Fam Hx      Review of Systems:  No other skin or systemic complaints except as noted in HPI or Assessment and Plan.  Objective  Well appearing patient in no apparent distress; mood and affect are within normal limits.  A full examination was performed including scalp, head, eyes, ears, nose, lips, neck, chest, axillae, abdomen, back, buttocks, bilateral upper extremities, bilateral lower extremities, hands, feet, fingers, toes, fingernails, and toenails. All findings within normal limits unless otherwise noted below.  face Small yellow papules with a central dell.   left cheek Erythematous thin papules/macules with gritty scale.    Assessment & Plan  Sebaceous hyperplasia face  Benign-appearing.  Observation.  Call clinic for new or changing lesions.    AK (actinic keratosis) left cheek  Actinic keratoses are precancerous spots that appear secondary to cumulative UV radiation exposure/sun exposure over time. They are chronic with expected duration over 1 year. A portion of actinic keratoses will progress to squamous cell carcinoma of the skin. It is not possible to reliably predict which spots will progress to skin cancer and so treatment is recommended to prevent development of skin cancer.  Recommend daily broad spectrum sunscreen SPF 30+ to sun-exposed areas, reapply every 2 hours as needed.  Recommend staying in the shade or wearing long sleeves, sun glasses (UVA+UVB protection) and wide brim hats (4-inch brim around the entire circumference of the  hat). Call for new or changing lesions.  Apply prescription 5-Fluorouracil/calcipotriene twice daily for 4 days or until red and crusty. Reviewed expected reaction and healing afterward. Apply vaseline as needed while healing.  Lentigines - Scattered tan macules - Due to sun exposure - Benign-appearing, observe - Recommend daily broad spectrum sunscreen SPF 30+ to sun-exposed areas, reapply every 2 hours as needed. - Call for any changes  Seborrheic Keratoses - Stuck-on, waxy, tan-brown papules and/or plaques  - Benign-appearing - Discussed benign etiology and prognosis. - Observe - Call for any changes  Melanocytic Nevi - Tan-brown and/or pink-flesh-colored symmetric macules and papules - Benign appearing on exam today - Observation - Call clinic for new or changing moles - Recommend daily use of broad spectrum spf 30+ sunscreen to sun-exposed areas.   Hemangiomas - Red papules - Discussed benign nature - Observe - Call for any changes  Actinic Damage - Chronic condition, secondary to cumulative UV/sun exposure - diffuse scaly erythematous macules with underlying dyspigmentation - Recommend daily broad spectrum sunscreen SPF 30+ to sun-exposed areas, reapply every 2 hours as needed.  - Staying in the shade or wearing long sleeves, sun glasses (UVA+UVB protection) and wide brim hats (4-inch brim around the entire circumference of the hat) are also recommended for sun protection.  - Call for new or changing lesions.  Skin cancer screening performed today.  History of Melanoma - No evidence of recurrence today - No lymphadenopathy - Recommend regular full body skin exams - Recommend daily broad spectrum sunscreen SPF 30+ to sun-exposed areas, reapply every 2 hours as needed.  -  Call if any new or changing lesions are noted between office visits  History of Basal Cell Carcinoma of the Skin - No evidence of recurrence today - Recommend regular full body skin exams -  Recommend daily broad spectrum sunscreen SPF 30+ to sun-exposed areas, reapply every 2 hours as needed.  - Call if any new or changing lesions are noted between office visits  Return in about 1 month (around 11/01/2021) for TBSE.  Graciella Belton, RMA, am acting as scribe for Forest Gleason, MD .  Documentation: I have reviewed the above documentation for accuracy and completeness, and I agree with the above.  Forest Gleason, MD

## 2021-10-02 ENCOUNTER — Other Ambulatory Visit: Payer: Self-pay | Admitting: Internal Medicine

## 2021-10-02 DIAGNOSIS — E1165 Type 2 diabetes mellitus with hyperglycemia: Secondary | ICD-10-CM

## 2021-10-03 ENCOUNTER — Telehealth: Payer: Self-pay | Admitting: Internal Medicine

## 2021-10-03 NOTE — Telephone Encounter (Signed)
When previously investigated, his insurance does cover as he is not on insulin. He had been choosing to pay cash price for it.

## 2021-10-03 NOTE — Telephone Encounter (Signed)
So what do we do appears they will not fill?

## 2021-10-03 NOTE — Telephone Encounter (Signed)
Received notice freestyle libre 2 PA denied can you help?

## 2021-10-06 NOTE — Telephone Encounter (Signed)
Called pharmacy. They are filling the prescription for cash price, as they have been doing for several months

## 2021-10-08 ENCOUNTER — Encounter: Payer: Self-pay | Admitting: Dermatology

## 2021-10-14 ENCOUNTER — Encounter: Payer: Self-pay | Admitting: Emergency Medicine

## 2021-10-14 ENCOUNTER — Encounter: Payer: Self-pay | Admitting: Internal Medicine

## 2021-10-14 ENCOUNTER — Other Ambulatory Visit: Payer: Self-pay

## 2021-10-14 ENCOUNTER — Ambulatory Visit
Admission: EM | Admit: 2021-10-14 | Discharge: 2021-10-14 | Disposition: A | Discharge status: Home / Self Care | Payer: Medicare Other | Attending: Family Medicine | Admitting: Family Medicine

## 2021-10-14 DIAGNOSIS — H6692 Otitis media, unspecified, left ear: Secondary | ICD-10-CM

## 2021-10-14 DIAGNOSIS — J069 Acute upper respiratory infection, unspecified: Secondary | ICD-10-CM

## 2021-10-14 MED ORDER — AZITHROMYCIN 250 MG PO TABS: ORAL_TABLET | ORAL | 0 refills | Status: DC

## 2021-10-14 MED ORDER — PROMETHAZINE-DM 6.25-15 MG/5ML PO SYRP: 5.0000 mL | ORAL_SOLUTION | Freq: Four times a day (QID) | ORAL | 0 refills | Status: DC | PRN

## 2021-10-14 NOTE — ED Provider Notes (Signed)
[facility]    CSN: [id number] Arrival date & time: 10/14/21  1113      History   Chief Complaint Chief Complaint  Patient presents with   Nasal Congestion   Generalized Body Aches   Fever   Cough   Sore Throat    HPI Jackson [name] is a 70 y.o. male.   HPI Patient presents today with nasal congestion, generalized body aches, fever, cough, and sore throat x 3 days.  Patient also reports associated bilateral ear pain left greater than right which started prior to the onset of other symptoms.  He reports feeling feverish and chills yesterday although did not measure his temperature.  He denies any known sick contacts.  Denies any difficulty breathing or any symptoms of chest tightness or congestion.  Past Medical History:  Diagnosis Date   Actinic keratosis    Allergy    Anemia    Arthritis    Basal cell carcinoma    Cancer (HCC)    BASAL CELL-FACE   Complication of anesthesia    PT IS A RED HEAD AND STATES IT TAKES ALOT MORE ANESTHESIA TO SEDATE HIM   Diabetes mellitus without complication (HCC)    Dysrhythmia    SVT   Fatty liver    GERD (gastroesophageal reflux disease)    Hyperlipidemia    Melanoma ([place]) 02/02/2019   Left forearm. Superficial spreading. Tumor thickness 0.65mm, anatomic level III. Excised: 02/22/2019, margins free.   Multiple thyroid nodules    Rheumatic fever    Sleep apnea 1997   Uses C-Pap machine   SVT (supraventricular tachycardia) (HCC)    Thyroid nodule     Patient Active Problem List   Diagnosis Date Noted   IPMN (intraductal papillary mucinous neoplasm) 05/14/2021   Kidney cysts 05/13/2021   Pancreatic cyst 05/13/2021   Gallstone pancreatitis 04/30/2021   Cholecystitis    Abnormal MRI, lumbar spine 02/17/2021   Iron deficiency 12/30/2020   Statin intolerance 08/23/2020   Hypertension associated with diabetes ([place]) 07/24/2020   Statin myopathy 03/20/2020   Type 2 diabetes mellitus with hyperglycemia, without  long-term current use of insulin ([place]) 12/19/2019   Elevated PSA 06/13/2019   Iron deficiency anemia 06/13/2019   Basal cell carcinoma of skin 03/02/2019   Melanoma of skin ([place]) 02/21/2019   AK (actinic keratosis) 11/23/2018   Vertigo 11/23/2018   Allergic rhinitis 07/04/2018   Fatty liver 03/03/2018   Bradycardia 03/03/2018   Fatigue 01/23/2018   Benign prostatic hyperplasia without lower urinary tract symptoms 01/23/2018   Type 2 diabetes mellitus without complication, without long-term current use of insulin ([place]) 01/23/2018   Thyroid nodule 01/20/2018   Hyperlipidemia 11/26/2016   DM type 2 with diabetic peripheral neuropathy ([place]) 05/27/2016   Paroxysmal SVT (supraventricular tachycardia) ([place]) 05/27/2016   GERD (gastroesophageal reflux disease) 05/27/2016   OSA (obstructive sleep apnea) 05/27/2016    Past Surgical History:  Procedure Laterality Date   [place] Right 05/2015   COLONOSCOPY  2016   LUMBAR FUSION  2015   L4/L5   [place] Medications    Prior to Admission medications   Medication Sig Start Date End Date Taking? Authorizing Provider  azithromycin (ZITHROMAX) 250 MG tablet Take 2 tabs PO x 1 dose, then 1 tab PO QD x 4 days 10/14/21  Yes Scot Jun, FNP  promethazine-dextromethorphan (  PROMETHAZINE-DM) 6.25-15 MG/5ML syrup Take 5 mLs by mouth 4 (four) times daily as needed for cough. 10/14/21  Yes Scot Jun, FNP  azelastine (ASTELIN) 0.1 % nasal spray Place 1 spray into both nostrils 2 (two) times daily as needed for rhinitis or allergies. Use in each nostril as directed 05/02/21   Enzo Bi, MD  benzonatate (TESSALON) 200 MG capsule Take 1 capsule (200 mg total) by mouth 3 (three) times daily as needed for cough. 09/02/21   Rodriguez-Southworth, Sunday Spillers, PA-C  cetirizine (ZYRTEC) 10 MG tablet Take 10 mg by mouth at bedtime.     [provider]   cholecalciferol (VITAMIN D3) 25 MCG (1000 UT) tablet Take 1,000 Units by mouth daily.    [provider]  Coenzyme Q10 (COQ10) 200 MG CAPS Take 200 mg by mouth daily.     [provider]  Continuous Blood Gluc Sensor (FREESTYLE LIBRE 2 SENSOR) MISC USE TO CHECK BLOOD SUGAR EVERY 8 HOURS 10/02/21   [name], [name], MD  Dulaglutide (TRULICITY) 0.17 PZ/0.2HE SOPN Inject 0.75 mg into the skin every Tuesday. 05/06/21   Enzo Bi, MD  ibuprofen (ADVIL) 600 MG tablet Take 1 tablet (600 mg total) by mouth every 6 (six) hours as needed. 05/02/21   Tylene Fantasia, PA-C  metFORMIN (GLUCOPHAGE) 1000 MG tablet TAKE 1 TABLET TWICE A DAY WITH MEALS 03/04/21   [name], [name], MD  metoprolol succinate (TOPROL-XL) 50 MG 24 hr tablet TAKE 1 TABLET DAILY WITH OR IMMEDIATELY FOLLOWING A MEAL (DOSE CHANGE) 12/02/20   [name], [name], MD  Multiple Vitamins-Minerals (MULTIVITAMIN ADULT PO) Take 1 tablet by mouth daily.     [provider]  Omega-3 Fatty Acids (FISH OIL) 1200 MG CPDR Take 1 tablet by mouth daily.    [provider]  omeprazole (PRILOSEC) 40 MG capsule Take 1 capsule (40 mg total) by mouth at bedtime. 05/02/21   Enzo Bi, MD  Red Yeast Rice 600 MG TABS Take 600 mg by mouth 2 (two) times daily with a meal.    [provider]  tamsulosin (FLOMAX) 0.4 MG CAPS capsule Take 1 capsule (0.4 mg total) by mouth at bedtime. 09/17/21   Billey Co, MD  triamcinolone ointment (KENALOG) 0.1 % Apply 1 application topically daily. As needed and cover with bandaid. Avoid face, groin, underarms. 01/28/21   Moye, [place], MD  vitamin C (ASCORBIC ACID) 500 MG tablet Take 1,000 mg by mouth daily.     [provider]  Vitamin E 400 units TABS Take 400 Units by mouth daily.     [provider]    Family History Family History  Problem Relation Age of Onset   Arthritis Mother    Lung cancer Mother    Hyperlipidemia Mother    Diabetes  Mother    Cholelithiasis Mother        gb removed   Arthritis Father    Diabetes Father    Lung disease Father    Pancreatitis Father        died age 46 pancreatitis   Cholelithiasis Brother        GB removed   Hyperlipidemia Maternal Grandmother    Alcohol abuse Maternal Grandfather    Hyperlipidemia Maternal Grandfather    Drug abuse Cousin    Hyperlipidemia Other        maternal side of family    Cholelithiasis Daughter        GB removed    Social History Social  History   Tobacco Use   Smoking status: Never   Smokeless tobacco: Former    Types: Snuff  Vaping Use   Vaping Use: Never used  Substance Use Topics   Alcohol use: Yes    Alcohol/week: 1.0 - 2.0 standard drink    Types: 1 - 2 Cans of beer per week    Comment: OCC   Drug use: No     Allergies   Januvia [sitagliptin], Lipitor [atorvastatin], and Statins   Review of Systems Review of Systems Pertinent negatives listed in HPI   Physical Exam Triage Vital Signs ED Triage Vitals [10/14/21 1214]  Enc Vitals Group     BP 126/80     Pulse Rate 72     Resp 18     Temp 98.2 F (36.8 C)     Temp Source Oral     SpO2 96 %     Weight      Height      Head Circumference      Peak Flow      Pain Score      Pain Loc      Pain Edu?      Excl. in [place]?    No data found.  Updated Vital Signs BP 126/80 (BP Location: Left Arm)    Pulse 72    Temp 98.2 F (36.8 C) (Oral)    Resp 18    SpO2 96%   Visual Acuity Right Eye Distance:   Left Eye Distance:   Bilateral Distance:    Right Eye Near:   Left Eye Near:    Bilateral Near:     Physical Exam  General Appearance:    Alert, cooperative, no distress  HENT:   Normocephalic, ears normal, nares mucosal edema with congestion, rhinorrhea, oropharynx patent   Eyes:    PERRL, conjunctiva/corneas clear, EOM's intact       Lungs:     Clear to auscultation bilaterally, respirations unlabored  Heart:    Regular rate and rhythm  Neurologic:   Awake, alert,  oriented x 3. No apparent focal neurological           defect.      UC Treatments / Results  Labs (all labs ordered are listed, but only abnormal results are displayed) Labs Reviewed - No data to display  EKG   Radiology No results found.  Procedures Procedures (including critical care time)  Medications Ordered in UC Medications - No data to display  Initial Impression / Assessment and Plan / UC Course  I have reviewed the triage vital signs and the nursing notes.  Pertinent labs & imaging results that were available during my care of the patient were reviewed by me and considered in my medical decision making (see chart for details).    Acute ear infection, left ear with URI symptoms Promethazine DM and Azithromycin .  Continue Cetirizine. Strict ER follow-up if symptoms worsen or become severe. RTC PRN Final Clinical Impressions(s) / UC Diagnoses   Final diagnoses:  Acute ear infection, left  Upper respiratory tract infection, unspecified type     Discharge Instructions      Continue zyrtec. Take medication as prescribed. If any of your symptoms worsen or do not readily improve, go immediately to the emergency department.     ED Prescriptions     Medication Sig Dispense Auth. Provider   promethazine-dextromethorphan (PROMETHAZINE-DM) 6.25-15 MG/5ML syrup Take 5 mLs by mouth 4 (four) times daily as needed for cough.  180 mL Scot Jun, FNP   azithromycin (ZITHROMAX) 250 MG tablet Take 2 tabs PO x 1 dose, then 1 tab PO QD x 4 days 6 tablet Scot Jun, FNP      PDMP not reviewed this encounter.   Scot Jun, FNP 10/14/21 1300

## 2021-10-14 NOTE — Discharge Instructions (Signed)
Continue zyrtec. Take medication as prescribed. If any of your symptoms worsen or do not readily improve, go immediately to the emergency department.

## 2021-10-14 NOTE — ED Triage Notes (Signed)
Pt c/o cough , ST, fever, bodyaches x 3 days

## 2021-10-28 ENCOUNTER — Ambulatory Visit: Payer: Medicare Other

## 2021-11-06 ENCOUNTER — Other Ambulatory Visit: Payer: Self-pay

## 2021-11-06 DIAGNOSIS — N400 Enlarged prostate without lower urinary tract symptoms: Secondary | ICD-10-CM

## 2021-11-06 MED ORDER — TAMSULOSIN HCL 0.4 MG PO CAPS: 0.4000 mg | ORAL_CAPSULE | Freq: Every day | ORAL | 3 refills | Status: DC

## 2021-11-06 NOTE — Telephone Encounter (Signed)
Pt requests rx be sent to mail order. RX sent.

## 2021-11-13 ENCOUNTER — Other Ambulatory Visit: Payer: Self-pay | Admitting: Internal Medicine

## 2021-11-13 DIAGNOSIS — E1165 Type 2 diabetes mellitus with hyperglycemia: Secondary | ICD-10-CM

## 2021-12-11 ENCOUNTER — Telehealth: Payer: Self-pay | Admitting: Internal Medicine

## 2021-12-11 NOTE — Telephone Encounter (Signed)
Prior authorization has been submitted for patient's [facility]  Awaiting approval or denial.

## 2021-12-22 DIAGNOSIS — H43813 Vitreous degeneration, bilateral: Secondary | ICD-10-CM | POA: Diagnosis not present

## 2021-12-22 LAB — HM DIABETES EYE EXAM

## 2022-01-18 ENCOUNTER — Encounter: Payer: Self-pay | Admitting: Internal Medicine

## 2022-01-20 ENCOUNTER — Other Ambulatory Visit: Payer: Self-pay | Admitting: Internal Medicine

## 2022-01-20 DIAGNOSIS — E1165 Type 2 diabetes mellitus with hyperglycemia: Secondary | ICD-10-CM

## 2022-01-20 NOTE — Telephone Encounter (Signed)
Please advise, okay for CPAP supplies order or does Patient need pulmonology?  ?

## 2022-01-23 ENCOUNTER — Encounter: Payer: Self-pay | Admitting: Internal Medicine

## 2022-01-26 ENCOUNTER — Other Ambulatory Visit: Payer: Self-pay | Admitting: Internal Medicine

## 2022-01-26 DIAGNOSIS — I471 Supraventricular tachycardia: Secondary | ICD-10-CM

## 2022-02-11 ENCOUNTER — Telehealth: Payer: Self-pay | Admitting: Internal Medicine

## 2022-02-11 NOTE — Telephone Encounter (Signed)
Copied from [place] [id number]. Topic: Medicare AWV ?>> Feb 11, 2022 11:08 AM Harris-Coley, Hannah Beat wrote: ?Reason for CRM: Left message for patient to schedule Annual Wellness Visit.  Please schedule with Nurse Health Advisor Denisa O'Brien-Blaney, LPN at [facility].  Please call [phone] ask for Juliann Pulse ?

## 2022-04-02 ENCOUNTER — Telehealth: Payer: Self-pay | Admitting: Internal Medicine

## 2022-04-02 NOTE — Telephone Encounter (Signed)
Spoke with patient spouse they declined AWV do not call  

## 2022-04-24 ENCOUNTER — Ambulatory Visit (INDEPENDENT_AMBULATORY_CARE_PROVIDER_SITE_OTHER): Payer: Medicare Other

## 2022-04-24 ENCOUNTER — Ambulatory Visit (INDEPENDENT_AMBULATORY_CARE_PROVIDER_SITE_OTHER): Payer: Medicare Other | Admitting: Internal Medicine

## 2022-04-24 ENCOUNTER — Encounter: Payer: Self-pay | Admitting: Internal Medicine

## 2022-04-24 VITALS — BP 130/70 | HR 70 | Temp 98.3°F | Resp 14 | Ht 73.0 in | Wt 242.0 lb

## 2022-04-24 DIAGNOSIS — R972 Elevated prostate specific antigen [PSA]: Secondary | ICD-10-CM | POA: Diagnosis not present

## 2022-04-24 DIAGNOSIS — R5383 Other fatigue: Secondary | ICD-10-CM | POA: Diagnosis not present

## 2022-04-24 DIAGNOSIS — M25562 Pain in left knee: Secondary | ICD-10-CM | POA: Diagnosis not present

## 2022-04-24 DIAGNOSIS — D509 Iron deficiency anemia, unspecified: Secondary | ICD-10-CM

## 2022-04-24 DIAGNOSIS — E785 Hyperlipidemia, unspecified: Secondary | ICD-10-CM | POA: Diagnosis not present

## 2022-04-24 DIAGNOSIS — G8929 Other chronic pain: Secondary | ICD-10-CM

## 2022-04-24 DIAGNOSIS — M1712 Unilateral primary osteoarthritis, left knee: Secondary | ICD-10-CM

## 2022-04-24 DIAGNOSIS — R0789 Other chest pain: Secondary | ICD-10-CM | POA: Diagnosis not present

## 2022-04-24 DIAGNOSIS — G720 Drug-induced myopathy: Secondary | ICD-10-CM

## 2022-04-24 DIAGNOSIS — E119 Type 2 diabetes mellitus without complications: Secondary | ICD-10-CM | POA: Diagnosis not present

## 2022-04-24 DIAGNOSIS — E1142 Type 2 diabetes mellitus with diabetic polyneuropathy: Secondary | ICD-10-CM | POA: Diagnosis not present

## 2022-04-24 DIAGNOSIS — K760 Fatty (change of) liver, not elsewhere classified: Secondary | ICD-10-CM | POA: Diagnosis not present

## 2022-04-24 DIAGNOSIS — R0602 Shortness of breath: Secondary | ICD-10-CM

## 2022-04-24 DIAGNOSIS — T466X5A Adverse effect of antihyperlipidemic and antiarteriosclerotic drugs, initial encounter: Secondary | ICD-10-CM

## 2022-04-24 DIAGNOSIS — E1165 Type 2 diabetes mellitus with hyperglycemia: Secondary | ICD-10-CM

## 2022-04-24 DIAGNOSIS — M25511 Pain in right shoulder: Secondary | ICD-10-CM | POA: Diagnosis not present

## 2022-04-24 DIAGNOSIS — M25512 Pain in left shoulder: Secondary | ICD-10-CM

## 2022-04-24 DIAGNOSIS — K219 Gastro-esophageal reflux disease without esophagitis: Secondary | ICD-10-CM

## 2022-04-24 NOTE — Progress Notes (Unsigned)
Chief Complaint  Patient presents with   Annual Exam    Nonfasting, denies any unusual pain.   HPI ROS Past Medical History:  Diagnosis Date   Actinic keratosis    Allergy    Anemia    Arthritis    Basal cell carcinoma    Cancer (HCC)    BASAL CELL-FACE   Complication of anesthesia    PT IS A RED HEAD AND STATES IT TAKES ALOT MORE ANESTHESIA TO SEDATE HIM   Diabetes mellitus without complication (HCC)    Dysrhythmia    SVT   Fatty liver    GERD (gastroesophageal reflux disease)    Hyperlipidemia    Melanoma ([place]) 02/02/2019   Left forearm. Superficial spreading. Tumor thickness 0.77m, anatomic level III. Excised: 02/22/2019, margins free.   Multiple thyroid nodules    Rheumatic fever    Sleep apnea 1997   Uses C-Pap machine   SVT (supraventricular tachycardia) (HCC)    Thyroid nodule    Past Surgical History:  Procedure Laterality Date   APPENDECTOMY  1965   BICEPS TENDON REPAIR Right 05/2015   COLONOSCOPY  2016   LUMBAR FUSION  2015   L4/L5   RHINOPLASTY  1997   TONSILLECTOMY  1956   VASECTOMY  1981   Family History  Problem Relation Age of Onset   Arthritis Mother    Lung cancer Mother    Hyperlipidemia Mother    Diabetes Mother    Cholelithiasis Mother        gb removed   Arthritis Father    Diabetes Father    Lung disease Father    Pancreatitis Father        died age 8537pancreatitis   Cholelithiasis Brother        GB removed   Hyperlipidemia Maternal Grandmother    Alcohol abuse Maternal Grandfather    Hyperlipidemia Maternal Grandfather    Drug abuse Cousin    Hyperlipidemia Other        maternal side of family    Cholelithiasis Daughter        GB removed   Social History   Socioeconomic History   Marital status: Married    Spouse name: Not on file   Number of children: Not on file   Years of education: Not on file   Highest education level: Not on file  Occupational History   Not on file  Tobacco Use   Smoking status: Never    Smokeless tobacco: Former    Types: Snuff  Vaping Use   Vaping Use: Never used  Substance and Sexual Activity   Alcohol use: Yes    Alcohol/week: 1.0 - 2.0 standard drink of alcohol    Types: 1 - 2 Cans of beer per week    Comment: OCC   Drug use: No   Sexual activity: Not Currently  Other Topics Concern   Not on file  Social History Narrative   Married    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   2 daughters   Social Determinants of Health   Financial Resource Strain: Low Risk  (05/09/2021)   Overall Financial Resource Strain (CARDIA)    Difficulty of Paying Living Expenses: Not hard at all  Food Insecurity: No Food Insecurity (09/09/2020)   Hunger Vital Sign    Worried About Running Out of Food in the Last Year: Never true    Ran Out of Food in the Last Year: Never true  Transportation Needs: No Transportation Needs (09/09/2020)   PRAPARE -  Hydrologist (Medical): No    Lack of Transportation (Non-Medical): No  Physical Activity: Unknown (09/01/2018)   Exercise Vital Sign    Days of Exercise per Week: 3 days    Minutes of Exercise per Session: Not on file  Stress: No Stress Concern Present (09/09/2020)   [place]    Feeling of Stress : Not at all  Social Connections: Unknown (09/09/2020)   Social Connection and Isolation Panel [NHANES]    Frequency of Communication with Friends and Family: Not on file    Frequency of Social Gatherings with Friends and Family: Not on file    Attends Religious Services: Not on file    Active Member of Clubs or Organizations: Not on file    Attends Archivist Meetings: Not on file    Marital Status: Married  Intimate Partner Violence: Not At Risk (09/09/2020)   Humiliation, Afraid, Rape, and Kick questionnaire    Fear of Current or Ex-Partner: No    Emotionally Abused: No    Physically Abused: No    Sexually Abused: No   Current Meds  Medication Sig    azelastine (ASTELIN) 0.1 % nasal spray Place 1 spray into both nostrils 2 (two) times daily as needed for rhinitis or allergies. Use in each nostril as directed   azithromycin (ZITHROMAX) 250 MG tablet Take 2 tabs PO x 1 dose, then 1 tab PO QD x 4 days   cetirizine (ZYRTEC) 10 MG tablet Take 10 mg by mouth at bedtime.    cholecalciferol (VITAMIN D3) 25 MCG (1000 UT) tablet Take 1,000 Units by mouth daily.   Coenzyme Q10 (COQ10) 200 MG CAPS Take 200 mg by mouth daily.    Continuous Blood Gluc Sensor (FREESTYLE LIBRE 2 SENSOR) MISC USE TO CHECK BLOOD SUGAR EVERY 8 HOURS   ibuprofen (ADVIL) 600 MG tablet Take 1 tablet (600 mg total) by mouth every 6 (six) hours as needed.   metFORMIN (GLUCOPHAGE) 1000 MG tablet TAKE 1 TABLET TWICE A DAY WITH MEALS (Patient taking differently: Take 1,000 mg by mouth daily.)   metoprolol succinate (TOPROL-XL) 50 MG 24 hr tablet TAKE 1 TABLET DAILY WITH OR IMMEDIATELY FOLLOWING A MEAL (DOSE CHANGE)   Multiple Vitamins-Minerals (MULTIVITAMIN ADULT PO) Take 1 tablet by mouth daily.    Omega-3 Fatty Acids (FISH OIL) 1200 MG CPDR Take 1 tablet by mouth daily.   omeprazole (PRILOSEC) 40 MG capsule Take 1 capsule (40 mg total) by mouth at bedtime.   promethazine-dextromethorphan (PROMETHAZINE-DM) 6.25-15 MG/5ML syrup Take 5 mLs by mouth 4 (four) times daily as needed for cough.   Red Yeast Rice 600 MG TABS Take 600 mg by mouth 2 (two) times daily with a meal.   tamsulosin (FLOMAX) 0.4 MG CAPS capsule Take 1 capsule (0.4 mg total) by mouth at bedtime.   triamcinolone ointment (KENALOG) 0.1 % Apply 1 application topically daily. As needed and cover with bandaid. Avoid face, groin, underarms.   TRULICITY 0.17 BL/3.9QZ SOPN INJECT 0.5 ML (0.75 MG TOTAL) UNDER THE SKIN ONCE A WEEK   vitamin C (ASCORBIC ACID) 500 MG tablet Take 1,000 mg by mouth daily.    Vitamin E 400 units TABS Take 400 Units by mouth daily.    Allergies  Allergen Reactions   Januvia [Sitagliptin]      Kidney pain    Lipitor [Atorvastatin]     10 mg ?myalgias   Statins Other (See Comments)  Myalgias; atorvastatin 10 mg, pravastatin 10 mg, rosuvastatin w/ unknown dose.    No results found for this or any previous visit (from the past 2160 hour(s)). Objective  Body mass index is 31.93 kg/m. Wt Readings from Last 3 Encounters:  04/24/22 242 lb (109.8 kg)  09/17/21 228 lb (103.4 kg)  05/13/21 226 lb 9.6 oz (102.8 kg)   Temp Readings from Last 3 Encounters:  04/24/22 98.3 F (36.8 C) (Oral)  10/14/21 98.2 F (36.8 C) (Oral)  09/02/21 98.8 F (37.1 C) (Oral)   BP Readings from Last 3 Encounters:  04/24/22 130/70  10/14/21 126/80  09/17/21 127/78   Pulse Readings from Last 3 Encounters:  04/24/22 70  10/14/21 72  09/17/21 (!) 59    Physical Exam  Assessment  Plan  No diagnosis found.   HM Reviewed labs 05/2020  Flu shot utd  Tdap utd Had prevnar  pna 23 vaccine utd 3/3 covid vaccines consider 4th Consider shingrix in future given Rx prev and he has declines vaccine twinrix 3/3 immune MMR immune   PSA had 07/10/2019 4.3 elevated then 12/26/19 3.5 estab urology Dr. Diamantina Providence f/u 01/16/21 1pm Results for Jackson [name], Jackson [name]" (MRN [id number]) as of 12/30/2020 13:50   Ref. Range 07/10/2019 15:13 12/26/2019 14:19 12/20/2020 13:49  Prostate Specific Ag, Serum Latest Ref Range: 0.0 - 4.0 ng/mL 4.3 (H) 3.5 4.1 (H)    PSA sch this week 12/20/20   Results for Jackson [name], Jackson [name]" (MRN [id number]) as of 05/13/2021 13:16   Ref. Range 12/20/2020 13:49  Prostate Specific Ag, Serum Latest Ref Range: 0.0 - 4.0 ng/mL 4.1 (H)  PSA, FREE Latest Ref Range: N/A ng/mL 0.91  % FREE PSA Latest Units: % 22.2    Zoster had 03/13/15, declines shingrix    hep C neg   Derm Dr. Laurence Ferrari LN2 arms, scalp due 08/2019 with h/o melanoma and seen 2021  Saw 11/05/20 f/u 05/05/21    Colonoscopy 10/17/15 normal fair prep consider repeat in 5 years 09/2020  no FH colon cancer  Pt wanted to do cologuard  09/05/20 negative  With iron def consider EGD/colonoscopy   rec healthy diet and exercise    CT chest had 04/06/18  CT CHEST WITHOUT CONTRAST   TECHNIQUE: Multidetector CT imaging of the chest was performed following the standard protocol without IV contrast.   COMPARISON:  None.   FINDINGS: Cardiovascular: Normal heart size. Trace pericardial thickening/fluid. Normal caliber thoracic aorta. Minimal atherosclerotic calcification of the aortic valve.   Mediastinum/Nodes: No enlarged mediastinal or axillary lymph nodes. Enlarged, nodular right thyroid lobe with coarse calcifications. The trachea and esophagus demonstrate no significant findings.   Lungs/Pleura: No focal consolidation, pleural effusion, or pneumothorax. Small focal areas of scarring in the lingula and left lower lobe. No suspicious pulmonary nodule.   Upper Abdomen: No acute abnormality. 2.0 cm simple cyst in the midpole of the right kidney. Subcentimeter lesions in the upper poles of both kidneys are too small to characterize.   Musculoskeletal: No chest wall mass or suspicious bone lesions identified.   IMPRESSION: 1.  No acute intrathoracic process. 2. Enlarged, nodular right thyroid lobe. Please see thyroid ultrasound from same day for further evaluation.   US thyroid 04/06/18 bx [facility] endo 04/2019 benign f/u 05/01/20 repeat thyroid US and 05/08/20 f/u Dr. Honor Junes    IMPRESSION: 1. Mild thyromegaly with bilateral nodules. 2. Recommend FNA biopsy of suspicious 2.5 cm inferior right AND moderately suspicious 2.8 cm mid right nodules. 3. Recommend annual/biennial  ultrasound follow-up of additional lesions as above until stability x5 years confirmed Thyroid bx 04/2019 benign    Sleep study 09/14/18 sleep med mild OSA total AHI 10 per hr, cpap titration/bipap 9 cm h20 but turned down to 8 -rec wt loss , nasal congestion, CPAP at 8 cm h20 he also had high periodic limb movement index with only few arousals ?RLS  +/- insomnia   -CPAP 8 cm H20 humidified, [place] Full face mask large measures to maintain airway address nasal congestion     04/30/21 MRI/MRCP (05/14/21 spoke with radiology likely pancreas findings post inflammatory and rec repeat MRI in 6 months) FINDINGS: Comment: Today's study is limited for detection and characterization of visceral and/or vascular lesions by lack of IV gadolinium.   Lower chest: Unremarkable.   Hepatobiliary: Diffuse loss of signal intensity throughout the hepatic parenchyma on out of phase dual echo images, indicative of a background of hepatic steatosis. No suspicious cystic or solid hepatic lesions are confidently identified on today's noncontrast examination. Tiny filling defects are present within the lumen of the gallbladder, compatible with small gallstones. Gallbladder is moderately distended. Gallbladder wall appears thickened and edematous measuring up to 11 mm in thickness with trace volume of pericholecystic fluid. There are no filling defects within the common bile duct to suggest choledocholithiasis. Common bile duct measures 3 mm in the porta hepatis. No intrahepatic biliary ductal dilatation noted on MRCP images.   Pancreas: In the inferior aspect of the pancreatic head (axial image 27 of series 7) there is a 9 mm T2 hyperintense lesion. In the pancreatic body (axial image 16 of series 7) there is an additional 6 mm T2 hyperintense lesion. No definite solid appearing pancreatic mass noted on today's noncontrast examination. No pancreatic ductal dilatation noted on MRCP images. No peripancreatic fluid collections or inflammatory changes.   Spleen:  Unremarkable.   Adrenals/Urinary Tract: There are several T1 hypointense and T2 hyperintense lesions in the kidneys, measuring up to 2.9 cm in diameter in the medial aspect of the upper pole of the right kidney. In addition, in the upper pole of the right kidney laterally there is a 1.3 cm lesion  (axial image 32 of series 19) which is mildly T1 hyperintense and T2 hyperintense. No hydroureteronephrosis in the visualized portions of the abdomen. Bilateral adrenal glands are normal in appearance.   Stomach/Bowel: Visualized portions are unremarkable.   Vascular/Lymphatic: No aneurysm identified in the visualized abdominal vasculature. No lymphadenopathy noted in the abdomen.   Other: No significant volume of ascites noted in the visualized portions of the peritoneal cavity.   Musculoskeletal: No aggressive appearing osseous lesions are noted in the visualize portions of the skeleton. Signal void noted in the lumbar spine related to indwelling spinal fixation hardware.   IMPRESSION: 1. Study is positive for cholelithiasis with imaging findings suggestive of acute cholecystitis. No evidence of choledocholithiasis. No findings of biliary tract obstruction. 2. Subcentimeter cystic lesions in the pancreatic head and body. These are statistically likely to represent focal areas of side branch ectasia or small pancreatic pseudocysts, however, the possibility of side branch IPMN (intraductal papillary mucinous neoplasm is not excluded). Recommend follow up pre and post contrast MRI/MRCP or pancreatic protocol CT in 2 years. This recommendation follows ACR consensus guidelines: Management of Incidental Pancreatic Cysts: A White Paper of the ACR Incidental Findings Committee. J Am Coll Radiol 7185;50:158-682. 3. Multiple renal lesions bilaterally, as above, incompletely characterized on today's noncontrast examination but likely to represent Bosniak class 1 and  Bosniak class 2 cysts. Attention at time of repeat abdominal MRI is recommended to ensure stability. 4. Hepatic steatosis.   Provider: Dr. Olivia Mackie [name]

## 2022-04-24 NOTE — Patient Instructions (Addendum)
Dr. Volanda Napoleon new PCP   [facility] [address] [facility] Sleep clinic in [place], [place] Address: [address] [place], [place], [place] [address] Hours:  Open ? Closes 5?PM Phone: [phone]  Dr. Rockey Situ or End  Phone Fax E-mail Address  [phone] [email or web address] [address]   [address]   [place] [address]     Specialties     Cardiology      Nonspecific Chest Pain, Adult Chest pain is an uncomfortable, tight, or painful feeling in the chest. The pain can feel like a crushing, aching, or squeezing pressure. A person can feel a burning or tingling sensation. Chest pain can also be felt in your back, neck, jaw, shoulder, or arm. This pain can be worse when you move, sneeze, or take a deep breath. Chest pain can be caused by a condition that is life-threatening. This must be treated right away. It can also be caused by something that is not life-threatening. If you have chest pain, it can be hard to know the difference, so it is important to get help right away to make sure that you do not have a serious condition. Some life-threatening causes of chest pain include: Heart attack. A tear in the body's main blood vessel (aortic dissection). Inflammation around your heart (pericarditis). A problem in the lungs, such as a blood clot (pulmonary embolism) or a collapsed lung (pneumothorax). Some non life-threatening causes of chest pain include: Heartburn. Anxiety or stress. Damage to the bones, muscles, and cartilage that make up your chest wall. Pneumonia or bronchitis. Shingles infection (varicella-zoster virus). Your chest pain may come and go. It may also be constant. Your health care provider will do tests and other studies to find the cause of your pain. Treatment will depend on the cause of your chest pain. Follow these instructions at home: Medicines Take over-the-counter and prescription medicines only as  told by your health care provider. If you were prescribed an antibiotic medicine, take it as told by your health care provider. Do not stop taking the antibiotic even if you start to feel better. Activity Avoid any activities that cause chest pain. Do not lift anything that is heavier than 10 lb (4.5 kg), or the limit that you are told, until your health care provider says that it is safe. Rest as directed by your health care provider. Return to your normal activities only as told by your health care provider. Ask your health care provider what activities are safe for you. Lifestyle     Do not use any products that contain nicotine or tobacco, such as cigarettes, e-cigarettes, and chewing tobacco. If you need help quitting, ask your health care provider. Do not drink alcohol. Make healthy lifestyle changes as recommended. These may include: Getting regular exercise. Ask your health care provider to suggest some exercises that are safe for you. Eating a heart-healthy diet. This includes plenty of fresh fruits and vegetables, whole grains, low-fat (lean) protein, and low-fat dairy products. A dietitian can help you find healthy eating options. Maintaining a healthy weight. Managing any other health conditions you may have, such as high blood pressure (hypertension) or diabetes. Reducing stress, such as with yoga or relaxation techniques. General instructions Pay attention to any changes in your symptoms. It is up to you to get the results of any tests that were done. Ask your health care provider, or the department that is doing the tests, when your results will be  ready. Keep all follow-up visits as told by your health care provider. This is important. You may be asked to go for further testing if your chest pain does not go away. Contact a health care provider if: Your chest pain does not go away. You feel depressed. You have a fever. You notice changes in your symptoms or develop new  symptoms. Get help right away if: Your chest pain gets worse. You have a cough that gets worse, or you cough up blood. You have severe pain in your abdomen. You faint. You have sudden, unexplained chest discomfort. You have sudden, unexplained discomfort in your arms, back, neck, or jaw. You have shortness of breath at any time. You suddenly start to sweat, or your skin gets clammy. You feel nausea or you vomit. You suddenly feel lightheaded or dizzy. You have severe weakness, or unexplained weakness or fatigue. Your heart begins to beat quickly, or it feels like it is skipping beats. These symptoms may represent a serious problem that is an emergency. Do not wait to see if the symptoms will go away. Get medical help right away. Call your local emergency services (911 in the [place] Do not drive yourself to the hospital. Summary Chest pain can be caused by a condition that is serious and requires urgent treatment. It may also be caused by something that is not life-threatening. Your health care provider may do lab tests and other studies to find the cause of your pain. Follow your health care provider's instructions on taking medicines, making lifestyle changes, and getting emergency treatment if symptoms become worse. Keep all follow-up visits as told by your health care provider. This includes visits for any further testing if your chest pain does not go away. This information is not intended to replace advice given to you by your health care provider. Make sure you discuss any questions you have with your health care provider. Document Revised: 12/26/2020 Document Reviewed: 12/26/2020 Elsevier Patient Education  [place].

## 2022-04-27 ENCOUNTER — Other Ambulatory Visit (INDEPENDENT_AMBULATORY_CARE_PROVIDER_SITE_OTHER): Payer: Medicare Other

## 2022-04-27 DIAGNOSIS — R5383 Other fatigue: Secondary | ICD-10-CM

## 2022-04-27 DIAGNOSIS — E1142 Type 2 diabetes mellitus with diabetic polyneuropathy: Secondary | ICD-10-CM | POA: Diagnosis not present

## 2022-04-27 DIAGNOSIS — R972 Elevated prostate specific antigen [PSA]: Secondary | ICD-10-CM | POA: Diagnosis not present

## 2022-04-27 DIAGNOSIS — D509 Iron deficiency anemia, unspecified: Secondary | ICD-10-CM

## 2022-04-27 LAB — CBC WITH DIFFERENTIAL/PLATELET
Basophils Absolute: 0 10*3/uL (ref 0.0–0.1)
Basophils Relative: 0.4 % (ref 0.0–3.0)
Eosinophils Absolute: 0.2 10*3/uL (ref 0.0–0.7)
Eosinophils Relative: 2.9 % (ref 0.0–5.0)
HCT: 43.4 % (ref 39.0–52.0)
Hemoglobin: 14.6 g/dL (ref 13.0–17.0)
Lymphocytes Relative: 34.4 % (ref 12.0–46.0)
Lymphs Abs: 2.7 10*3/uL (ref 0.7–4.0)
MCHC: 33.6 g/dL (ref 30.0–36.0)
MCV: 91 fl (ref 78.0–100.0)
Monocytes Absolute: 0.5 10*3/uL (ref 0.1–1.0)
Monocytes Relative: 6.8 % (ref 3.0–12.0)
Neutro Abs: 4.4 10*3/uL (ref 1.4–7.7)
Neutrophils Relative %: 55.5 % (ref 43.0–77.0)
Platelets: 192 10*3/uL (ref 150.0–400.0)
RBC: 4.77 Mil/uL (ref 4.22–5.81)
RDW: 13.9 % (ref 11.5–15.5)
WBC: 8 10*3/uL (ref 4.0–10.5)

## 2022-04-27 LAB — COMPREHENSIVE METABOLIC PANEL
ALT: 25 U/L (ref 0–53)
AST: 19 U/L (ref 0–37)
Albumin: 4.2 g/dL (ref 3.5–5.2)
Alkaline Phosphatase: 41 U/L (ref 39–117)
BUN: 16 mg/dL (ref 6–23)
CO2: 28 mEq/L (ref 19–32)
Calcium: 8.8 mg/dL (ref 8.4–10.5)
Chloride: 103 mEq/L (ref 96–112)
Creatinine, Ser: 1.19 mg/dL (ref 0.40–1.50)
GFR: 62.24 mL/min (ref >60.00)
Glucose, Bld: 96 mg/dL (ref 70–99)
Potassium: 3.8 mEq/L (ref 3.5–5.1)
Sodium: 139 mEq/L (ref 135–145)
Total Bilirubin: 0.5 mg/dL (ref 0.2–1.2)
Total Protein: 6.2 g/dL (ref 6.0–8.3)

## 2022-04-27 LAB — IBC + FERRITIN
Ferritin: 27.5 ng/mL (ref 22.0–322.0)
Iron: 70 ug/dL (ref 42–165)
Saturation Ratios: 15.4 % — ABNORMAL LOW (ref 20.0–50.0)
TIBC: 455 ug/dL — ABNORMAL HIGH (ref 250.0–450.0)
Transferrin: 325 mg/dL (ref 212.0–360.0)

## 2022-04-27 LAB — LIPID PANEL
Cholesterol: 175 mg/dL (ref 0–200)
HDL: 28.6 mg/dL — ABNORMAL LOW (ref >39.00)
LDL Cholesterol: 109 mg/dL — ABNORMAL HIGH (ref 0–99)
NonHDL: 146.16
Total CHOL/HDL Ratio: 6
Triglycerides: 186 mg/dL — ABNORMAL HIGH (ref 0.0–149.0)
VLDL: 37.2 mg/dL (ref 0.0–40.0)

## 2022-04-27 LAB — PSA: PSA: 3.49 ng/mL (ref 0.10–4.00)

## 2022-04-27 LAB — TSH: TSH: 2.18 u[IU]/mL (ref 0.35–5.50)

## 2022-04-27 LAB — HEMOGLOBIN A1C: Hgb A1c MFr Bld: 6.5 % (ref 4.6–6.5)

## 2022-04-28 LAB — MICROALBUMIN / CREATININE URINE RATIO
Creatinine, Urine: 106 mg/dL (ref 20–320)
Microalb Creat Ratio: 4 mcg/mg creat (ref <30)
Microalb, Ur: 0.4 mg/dL

## 2022-04-28 LAB — URINALYSIS, ROUTINE W REFLEX MICROSCOPIC
Bilirubin Urine: NEGATIVE
Glucose, UA: NEGATIVE
Hgb urine dipstick: NEGATIVE
Ketones, ur: NEGATIVE
Leukocytes,Ua: NEGATIVE
Nitrite: NEGATIVE
Protein, ur: NEGATIVE
Specific Gravity, Urine: 1.016 (ref 1.001–1.035)
pH: 6.5 (ref 5.0–8.0)

## 2022-04-29 ENCOUNTER — Telehealth: Payer: Self-pay

## 2022-04-29 ENCOUNTER — Telehealth: Payer: Self-pay | Admitting: Cardiovascular Disease

## 2022-04-29 DIAGNOSIS — M1712 Unilateral primary osteoarthritis, left knee: Secondary | ICD-10-CM | POA: Insufficient documentation

## 2022-04-29 MED ORDER — METFORMIN HCL 1000 MG PO TABS: 1000.0000 mg | ORAL_TABLET | Freq: Every day | ORAL | 3 refills | Status: DC

## 2022-04-29 MED ORDER — OMEPRAZOLE 40 MG PO CPDR: 40.0000 mg | DELAYED_RELEASE_CAPSULE | Freq: Every day | ORAL | 3 refills | Status: DC

## 2022-04-29 MED ORDER — TRULICITY 0.75 MG/0.5ML ~~LOC~~ SOAJ: SUBCUTANEOUS | 11 refills | Status: DC

## 2022-04-29 NOTE — Telephone Encounter (Signed)
Pt c/o Shortness Of Breath: STAT if SOB developed within the last 24 hours or pt is noticeably SOB on the phone  1. Are you currently SOB (can you hear that pt is SOB on the phone)? no  2. How long have you been experiencing SOB? Several months   3. Are you SOB when sitting or when up moving around? Moving around   4. Are you currently experiencing any other symptoms? Pt states that he has been having some SOB such as when he is going up and down the stairs and for him that is unusual. Pt made a f/u appt with [place], [place] 06/16/22 @ 3:10pm

## 2022-04-29 NOTE — Telephone Encounter (Signed)
Spoke with patient who reports that he had physical last week. Labs came back showing low ferritin and saturation ratio was also low. He has noticed being "winded" when going up stairs and with activity. He reports mild chest pain not related to exercise. He feels it could be due to being more sedentary. Scheduled him to come in later this week and reviewed strict ED precautions. He verbalized understanding with no further questions at this time.

## 2022-04-29 NOTE — Telephone Encounter (Signed)
Lvm for pt to return call in regards to lab & x-ray results.  Per Dr.Tracy: Iron is low rec multivitamin with iron daily otc  Cholesterol improved great work keep it up healthy diet and exercise  No protein in urine  PSA approaching 4 f/u urology Dr. Diamantina Providence  Thyroid lab normal  Blood cts normla  Liver kidneys normal  A1c improved 6.5   Arthritis mostly in inner knee  Does pt want to see [facility] ortho or [facility]?

## 2022-05-01 ENCOUNTER — Ambulatory Visit (INDEPENDENT_AMBULATORY_CARE_PROVIDER_SITE_OTHER): Payer: Medicare Other | Admitting: Physician Assistant

## 2022-05-01 ENCOUNTER — Other Ambulatory Visit: Payer: Self-pay | Admitting: Internal Medicine

## 2022-05-01 ENCOUNTER — Encounter: Payer: Self-pay | Admitting: Physician Assistant

## 2022-05-01 VITALS — BP 110/74 | HR 65 | Ht 73.0 in | Wt 239.0 lb

## 2022-05-01 DIAGNOSIS — I471 Supraventricular tachycardia: Secondary | ICD-10-CM | POA: Diagnosis not present

## 2022-05-01 DIAGNOSIS — K219 Gastro-esophageal reflux disease without esophagitis: Secondary | ICD-10-CM

## 2022-05-01 DIAGNOSIS — R072 Precordial pain: Secondary | ICD-10-CM

## 2022-05-01 DIAGNOSIS — R0609 Other forms of dyspnea: Secondary | ICD-10-CM

## 2022-05-01 DIAGNOSIS — I208 Other forms of angina pectoris: Secondary | ICD-10-CM

## 2022-05-01 DIAGNOSIS — E782 Mixed hyperlipidemia: Secondary | ICD-10-CM

## 2022-05-01 MED ORDER — METOPROLOL TARTRATE 100 MG PO TABS: 100.0000 mg | ORAL_TABLET | Freq: Once | ORAL | 0 refills | Status: DC

## 2022-05-01 MED ORDER — OMEPRAZOLE 40 MG PO CPDR: 40.0000 mg | DELAYED_RELEASE_CAPSULE | Freq: Every day | ORAL | 3 refills | Status: DC

## 2022-05-01 NOTE — Progress Notes (Signed)
Cardiology Office Note    Date:  05/01/2022   ID:  Jackson [name], DOB [birth year], MRN [id number]  PCP:  [name], [name], MD  Cardiologist:  Ida Rogue, MD  Electrophysiologist:  None   Chief Complaint: Exertional shortness of breath and chest tightness  History of Present Illness:   Jackson [name] is a 71 y.o. male with history of SVT, DM2, HLD with statin intolerance, thyroid nodules, and sleep apnea who presents for evaluation of exertional shortness of breath and chest tightness for 5 to 6 months.  Patient has previously undergone a stress echo through [facility] in 2010, for preoperative cardiac risk stratification, with results being unavailable for review.  He established care with Dr. Rockey Situ in 2019 for cardiac risk stratification.  CT imaging at that time showed no evidence of coronary artery calcification or aortic atherosclerosis with minimal calcification of the aortic valve noted.  He has been maintained on metoprolol for SVT.  More recently, he was evaluated by his PCP on 04/24/2022 with shortness of breath and chest pain.  Echo was ordered and is pending at this time.  He subsequently contacted our office noting a several month history of exertional shortness of breath and appointment was scheduled for today.  He comes in today indicating his wife has noticed a progressive increase in the patient's exertional shortness of breath with household and yard activities.  The patient indicates he has not significantly noticed a change in his breathing.  He indicates he is able to ride his recumbent bike without symptoms of dyspnea, typically getting a heart rate into the low 100s bpm.  He has not felt any symptoms similar to his prior known SVT with these episodes of dyspnea.  He also notes some intermittent chest tightness that is randomly occurring, but will also occur when carrying heavy objects.  Symptoms improved with rest.  With regards to his history of SVT, he has had  several episodes, and appears to be well controlled at this time on Toprol-XL 50 mg, which is a decrease in dose when compared to prior.  He denies any lower extremity swelling, abdominal distention, orthopnea, PND, or early satiety.  No dizziness, presyncope, or syncope.  Currently asymptomatic.   Labs independently reviewed: 04/2022 - potassium 3.8, BUN 16, serum creatinine 1.19, albumin 4.2, AST/ALT normal, TC 175, TG 186, HDL 28, LDL 109, A1c 6.8, Hgb 14.6, PLT 192, TSH normal  Past Medical History:  Diagnosis Date   Actinic keratosis    Allergy    Anemia    Arthritis    Basal cell carcinoma    Cancer ([facility])    BASAL CELL-FACE   Complication of anesthesia    PT IS A RED HEAD AND STATES IT TAKES ALOT MORE ANESTHESIA TO SEDATE HIM   Diabetes mellitus without complication (HCC)    Dysrhythmia    SVT   Fatty liver    GERD (gastroesophageal reflux disease)    Hyperlipidemia    Melanoma ([place]) 02/02/2019   Left forearm. Superficial spreading. Tumor thickness 0.62m, anatomic level III. Excised: 02/22/2019, margins free.   Multiple thyroid nodules    Rheumatic fever    Sleep apnea 1997   Uses C-Pap machine   SVT (supraventricular tachycardia) (HCC)    Thyroid nodule     Past Surgical History:  Procedure Laterality Date   APPENDECTOMY  1965   BICEPS TENDON REPAIR Right 05/2015   COLONOSCOPY  2016   LUMBAR FUSION  2015   L4/L5  RHINOPLASTY  1997   SHOULDER SURGERY Left    2001   TONSILLECTOMY  1956   VASECTOMY  1981    Current Medications: Current Meds  Medication Sig   azelastine (ASTELIN) 0.1 % nasal spray Place 1 spray into both nostrils 2 (two) times daily as needed for rhinitis or allergies. Use in each nostril as directed   cetirizine (ZYRTEC) 10 MG tablet Take 10 mg by mouth at bedtime.    cholecalciferol (VITAMIN D3) 25 MCG (1000 UT) tablet Take 1,000 Units by mouth daily.   Coenzyme Q10 (COQ10) 200 MG CAPS Take 200 mg by mouth daily.    Continuous Blood Gluc  Sensor (FREESTYLE LIBRE 2 SENSOR) MISC USE TO CHECK BLOOD SUGAR EVERY 8 HOURS   Dulaglutide (TRULICITY) 8.11 BJ/4.7WG SOPN INJECT 0.5 ML (0.75 MG TOTAL) UNDER THE SKIN ONCE A WEEK   ibuprofen (ADVIL) 600 MG tablet Take 1 tablet (600 mg total) by mouth every 6 (six) hours as needed.   metFORMIN (GLUCOPHAGE) 1000 MG tablet Take 1 tablet (1,000 mg total) by mouth daily. With food   metoprolol succinate (TOPROL-XL) 50 MG 24 hr tablet TAKE 1 TABLET DAILY WITH OR IMMEDIATELY FOLLOWING A MEAL (DOSE CHANGE)   metoprolol tartrate (LOPRESSOR) 100 MG tablet Take 1 tablet (100 mg total) by mouth once for 1 dose. Take 2 hours before your test.   Multiple Vitamins-Minerals (MULTIVITAMIN ADULT PO) Take 1 tablet by mouth daily.    Omega-3 Fatty Acids (FISH OIL) 1200 MG CPDR Take 1 tablet by mouth daily.   omeprazole (PRILOSEC) 40 MG capsule Take 1 capsule (40 mg total) by mouth at bedtime. 30 min before food   Red Yeast Rice 600 MG TABS Take 600 mg by mouth 2 (two) times daily with a meal.   tamsulosin (FLOMAX) 0.4 MG CAPS capsule Take 1 capsule (0.4 mg total) by mouth at bedtime.   triamcinolone ointment (KENALOG) 0.1 % Apply 1 application topically daily. As needed and cover with bandaid. Avoid face, groin, underarms.   vitamin C (ASCORBIC ACID) 500 MG tablet Take 1,000 mg by mouth daily.    Vitamin E 400 units TABS Take 400 Units by mouth daily.     Allergies:   Januvia [sitagliptin], Lipitor [atorvastatin], and Statins   Social History   Socioeconomic History   Marital status: Married    Spouse name: Not on file   Number of children: Not on file   Years of education: Not on file   Highest education level: Not on file  Occupational History   Not on file  Tobacco Use   Smoking status: Never   Smokeless tobacco: Former    Types: Snuff  Vaping Use   Vaping Use: Never used  Substance and Sexual Activity   Alcohol use: Yes    Alcohol/week: 1.0 - 2.0 standard drink of alcohol    Types: 1 - 2 Cans  of beer per week    Comment: OCC   Drug use: No   Sexual activity: Not Currently  Other Topics Concern   Not on file  Social History Narrative   Married    Former Nature conservation officer    2 daughters   Social Determinants of Health   Financial Resource Strain: Low Risk  (05/09/2021)   Overall Financial Resource Strain (CARDIA)    Difficulty of Paying Living Expenses: Not hard at all  Food Insecurity: No Food Insecurity (09/09/2020)   Hunger Vital Sign    Worried About Running Out of Food in the  Last Year: Never true    Show Low in the Last Year: Never true  Transportation Needs: No Transportation Needs (09/09/2020)   PRAPARE - Hydrologist (Medical): No    Lack of Transportation (Non-Medical): No  Physical Activity: Unknown (09/01/2018)   Exercise Vital Sign    Days of Exercise per Week: 3 days    Minutes of Exercise per Session: Not on file  Stress: No Stress Concern Present (09/09/2020)   [place]    Feeling of Stress : Not at all  Social Connections: Unknown (09/09/2020)   Social Connection and Isolation Panel [NHANES]    Frequency of Communication with Friends and Family: Not on file    Frequency of Social Gatherings with Friends and Family: Not on file    Attends Religious Services: Not on file    Active Member of Clubs or Organizations: Not on file    Attends Archivist Meetings: Not on file    Marital Status: Married     Family History:  The patient's family history includes Alcohol abuse in his maternal grandfather; Arthritis in his father and mother; Cholelithiasis in his brother, daughter, and mother; Diabetes in his father and mother; Drug abuse in his cousin; Hyperlipidemia in his maternal grandfather, maternal grandmother, mother, and another family member; Lung cancer in his mother; Lung disease in his father; Pancreatitis in his father.  ROS:   12-point review  of systems is negative unless otherwise noted in the HPI.   EKGs/Labs/Other Studies Reviewed:    Studies reviewed were summarized above. The additional studies were reviewed today:  2D echo pending  EKG:  EKG is ordered today.  The EKG ordered today demonstrates NSR, 65 bpm, no acute ST-T changes  Recent Labs: 05/02/2021: Magnesium 2.3 04/27/2022: ALT 25; BUN 16; Creatinine, Ser 1.19; Hemoglobin 14.6; Platelets 192.0; Potassium 3.8; Sodium 139; TSH 2.18  Recent Lipid Panel    Component Value Date/Time   CHOL 175 04/27/2022 0734   TRIG 186.0 (H) 04/27/2022 0734   HDL 28.60 (L) 04/27/2022 0734   CHOLHDL 6 04/27/2022 0734   VLDL 37.2 04/27/2022 0734   LDLCALC 109 (H) 04/27/2022 0734   LDLDIRECT 155.0 07/19/2020 0803    PHYSICAL EXAM:    VS:  BP 110/74 (BP Location: Left Arm, Patient Position: Sitting, Cuff Size: Normal)   Pulse 65   Ht '6\' 1"'$  (1.854 m)   Wt 239 lb (108.4 kg)   SpO2 98%   BMI 31.53 kg/m   BMI: Body mass index is 31.53 kg/m.  Physical Exam Vitals reviewed.  Constitutional:      Appearance: He is well-developed.  HENT:     Head: Normocephalic and atraumatic.  Eyes:     General:        Right eye: No discharge.        Left eye: No discharge.  Neck:     Vascular: No JVD.  Cardiovascular:     Rate and Rhythm: Normal rate and regular rhythm.     Pulses:          Posterior tibial pulses are 2+ on the right side and 2+ on the left side.     Heart sounds: Normal heart sounds, S1 normal and S2 normal. Heart sounds not distant. No midsystolic click and no opening snap. No murmur heard.    No friction rub.  Pulmonary:     Effort: Pulmonary effort is normal. No  respiratory distress.     Breath sounds: Normal breath sounds. No decreased breath sounds, wheezing or rales.  Chest:     Chest wall: No tenderness.  Abdominal:     General: There is no distension.  Musculoskeletal:     Cervical back: Normal range of motion.     Right lower leg: No edema.     Left  lower leg: No edema.  Skin:    General: Skin is warm and dry.     Nails: There is no clubbing.  Neurological:     Mental Status: He is alert and oriented to person, place, and time.  Psychiatric:        Speech: Speech normal.        Behavior: Behavior normal.        Thought Content: Thought content normal.        Judgment: Judgment normal.     Wt Readings from Last 3 Encounters:  05/01/22 239 lb (108.4 kg)  04/24/22 242 lb (109.8 kg)  09/17/21 228 lb (103.4 kg)     ASSESSMENT & PLAN:   Exertional chest tightness/dyspnea concerning for anginal equivalent: Currently symptom-free.  Echo has been ordered by PCP and is scheduled for next week.  We will follow-up on these results.  Schedule coronary CTA.  Pending results, we may need to consider escalation of medical therapy for risk factor modification.  Of note, he has noted myalgias to low and high intensity statins.  He has previously been evaluated by the lipid clinic with recommendation for lifestyle modification at that time.  Further recommendations pending cardiac testing.  SVT: Quiescent.  He remains on Toprol-XL 50 mg daily.  No symptoms consistent with prior known SVT associated with his chest tightness or dyspnea.  HLD: Intolerant to low and high intensity statins.  Previously evaluated by the lipid clinic as outlined above.  Lifestyle modification recommended.  Await coronary CTA.    Disposition: F/u with Dr. Rockey Situ or an APP in 4 to 6 weeks.   Medication Adjustments/Labs and Tests Ordered: Current medicines are reviewed at length with the patient today.  Concerns regarding medicines are outlined above. Medication changes, Labs and Tests ordered today are summarized above and listed in the Patient Instructions accessible in Encounters.   Signed, Christell Faith, PA-C 05/01/2022 3:52 PM     [place] [address] [place], [place] [address] [phone]

## 2022-05-01 NOTE — Patient Instructions (Addendum)
Medication Instructions:  No changes at this time.   *If you need a refill on your cardiac medications before your next appointment, please call your pharmacy*   Lab Work: None  If you have labs (blood work) drawn today and your tests are completely normal, you will receive your results only by: [facility] (if you have MyChart) OR A paper copy in the mail If you have any lab test that is abnormal or we need to change your treatment, we will call you to review the results.   Testing/Procedures:   Your cardiac CT will be done at the below location:    [facility] [address] [place], [place] [address] [phone]  Scheduled for 05/07/22 at 2:00 pm. Please arrive at 1:45 pm to do check-in and test prep.  Please follow these instructions carefully (unless otherwise directed):  Hold all erectile dysfunction medications at least 3 days (72 hrs) prior to test.  On the Night Before the Test: Be sure to Drink plenty of water. Do not consume any caffeinated/decaffeinated beverages or chocolate 12 hours prior to your test. Do not take any antihistamines 12 hours prior to your test.   On the Day of the Test: Drink plenty of water until 1 hour prior to the test. Do not eat any food 4 hours prior to the test. You may take your regular medications prior to the test.  Take metoprolol (Lopressor) 100 mg two hours prior to test. HOLD Furosemide/Hydrochlorothiazide morning of the test. FEMALES- please wear underwire-free bra if available, avoid dresses & tight clothing        After the Test: Drink plenty of water. After receiving IV contrast, you may experience a mild flushed feeling. This is normal. On occasion, you may experience a mild rash up to 24 hours after the test. This is not dangerous. If this occurs, you can take Benadryl 25 mg and increase your fluid intake. If you experience trouble breathing, this can be serious. If  it is severe call 911 IMMEDIATELY. If it is mild, please call our office. If you take any of these medications: Glipizide/Metformin, Avandament, Glucavance, please do not take 48 hours after completing test unless otherwise instructed.  We will call to schedule your test 2-4 weeks out understanding that some insurance companies will need an authorization prior to the service being performed.   For non-scheduling related questions, please contact the cardiac imaging nurse navigator should you have any questions/concerns: Marchia Bond, Cardiac Imaging Nurse Navigator Gordy Clement, Cardiac Imaging Nurse Navigator [facility] Direct Office Dial: [phone]   For scheduling needs, including cancellations and rescheduling, please call [place], [phone].    Follow-Up: At [facility], you and your health needs are our priority.  As part of our continuing mission to provide you with exceptional heart care, we have created designated Provider Care Teams.  These Care Teams include your primary Cardiologist (physician) and Advanced Practice Providers (APPs -  Physician Assistants and Nurse Practitioners) who all work together to provide you with the care you need, when you need it.    Your next appointment:   6 week(s)  The format for your next appointment:   In Person  Provider:   Ida Rogue, MD or Christell Faith, PA-C       Important Information About [place]

## 2022-05-06 ENCOUNTER — Telehealth (HOSPITAL_COMMUNITY): Payer: Self-pay | Admitting: Emergency Medicine

## 2022-05-06 NOTE — Telephone Encounter (Signed)
Reaching out to patient to offer assistance regarding upcoming cardiac imaging study; pt verbalizes understanding of appt date/time, parking situation and where to check in, pre-test NPO status and medications ordered, and verified current allergies; name and call back number provided for further questions should they arise Marchia Bond RN Navigator Cardiac Imaging [facility] [phone] office [phone] cell  Hold toprol xl , '100mg'$  lopressor 2 hr prior to scan Arrival 145

## 2022-05-07 ENCOUNTER — Ambulatory Visit
Admission: RE | Admit: 2022-05-07 | Discharge: 2022-05-07 | Disposition: A | Discharge status: Home / Self Care | Payer: Medicare Other | Source: Ambulatory Visit | Attending: Internal Medicine | Admitting: Internal Medicine

## 2022-05-07 ENCOUNTER — Ambulatory Visit
Admission: RE | Admit: 2022-05-07 | Discharge: 2022-05-07 | Disposition: A | Discharge status: Home / Self Care | Payer: Medicare Other | Source: Ambulatory Visit | Attending: Physician Assistant | Admitting: Physician Assistant

## 2022-05-07 DIAGNOSIS — E785 Hyperlipidemia, unspecified: Secondary | ICD-10-CM | POA: Diagnosis not present

## 2022-05-07 DIAGNOSIS — R0602 Shortness of breath: Secondary | ICD-10-CM | POA: Diagnosis not present

## 2022-05-07 DIAGNOSIS — R072 Precordial pain: Secondary | ICD-10-CM | POA: Diagnosis not present

## 2022-05-07 DIAGNOSIS — R0600 Dyspnea, unspecified: Secondary | ICD-10-CM | POA: Diagnosis not present

## 2022-05-07 DIAGNOSIS — E119 Type 2 diabetes mellitus without complications: Secondary | ICD-10-CM | POA: Diagnosis not present

## 2022-05-07 DIAGNOSIS — R0789 Other chest pain: Secondary | ICD-10-CM | POA: Diagnosis not present

## 2022-05-07 LAB — ECHOCARDIOGRAM COMPLETE
AR max vel: 3.77 cm2
AV Area VTI: 4.27 cm2
AV Area mean vel: 3.46 cm2
AV Mean grad: 4 mmHg
AV Peak grad: 6 mmHg
Ao pk vel: 1.22 m/s
Area-P 1/2: 3.54 cm2
S' Lateral: 3.3 cm

## 2022-05-07 MED ORDER — IOHEXOL 350 MG/ML SOLN
75.0000 mL | Freq: Once | INTRAVENOUS | Status: AC | PRN
  Administered 2022-05-07: 75 mL via INTRAVENOUS

## 2022-05-07 MED ORDER — NITROGLYCERIN 0.4 MG SL SUBL
0.8000 mg | SUBLINGUAL_TABLET | Freq: Once | SUBLINGUAL | Status: AC
  Administered 2022-05-07: 0.8 mg via SUBLINGUAL

## 2022-05-07 NOTE — Progress Notes (Signed)
*  PRELIMINARY RESULTS* Echocardiogram 2D Echocardiogram has been performed.  Jackson [name] 05/07/2022, 10:40 AM

## 2022-05-07 NOTE — Progress Notes (Signed)
Patient tolerated CT well. Drank water after. Vital signs stable encourage to drink water throughout day.Reasons explained and verbalized understanding. Ambulated steady gait.  

## 2022-06-01 ENCOUNTER — Other Ambulatory Visit: Payer: Self-pay | Admitting: Internal Medicine

## 2022-06-01 DIAGNOSIS — J309 Allergic rhinitis, unspecified: Secondary | ICD-10-CM

## 2022-06-03 ENCOUNTER — Ambulatory Visit (INDEPENDENT_AMBULATORY_CARE_PROVIDER_SITE_OTHER): Payer: Medicare Other | Admitting: Dermatology

## 2022-06-03 DIAGNOSIS — L570 Actinic keratosis: Secondary | ICD-10-CM | POA: Diagnosis not present

## 2022-06-03 DIAGNOSIS — L820 Inflamed seborrheic keratosis: Secondary | ICD-10-CM | POA: Diagnosis not present

## 2022-06-03 DIAGNOSIS — L720 Epidermal cyst: Secondary | ICD-10-CM | POA: Diagnosis not present

## 2022-06-03 DIAGNOSIS — D489 Neoplasm of uncertain behavior, unspecified: Secondary | ICD-10-CM

## 2022-06-03 DIAGNOSIS — C44612 Basal cell carcinoma of skin of right upper limb, including shoulder: Secondary | ICD-10-CM | POA: Diagnosis not present

## 2022-06-03 DIAGNOSIS — C44619 Basal cell carcinoma of skin of left upper limb, including shoulder: Secondary | ICD-10-CM | POA: Diagnosis not present

## 2022-06-03 DIAGNOSIS — C4491 Basal cell carcinoma of skin, unspecified: Secondary | ICD-10-CM

## 2022-06-03 HISTORY — DX: Basal cell carcinoma of skin, unspecified: C44.91

## 2022-06-03 NOTE — Progress Notes (Signed)
Follow-Up Visit   Subjective  Jackson [name] is a 71 y.o. male who presents for the following: Skin Problem (Patient reports several spots he would like checked. Spot at right ear, under right eye, spots at neck, left temple area, right and left forearm area, and right thigh. ).  The patient has spots, moles and lesions to be evaluated, some may be new or changing and the patient has concerns that these could be cancer.   The following portions of the chart were reviewed this encounter and updated as appropriate:  Tobacco  Allergies  Meds  Problems  Med Hx  Surg Hx  Fam Hx      Review of Systems: No other skin or systemic complaints except as noted in HPI or Assessment and Plan.   Objective  Well appearing patient in no apparent distress; mood and affect are within normal limits.  A focused examination was performed including face, neck, chest, arms, right thigh. Relevant physical exam findings are noted in the Assessment and Plan.  right temporal scalp above ear x 1, right lower eyelid  x 1, right thigh anterior x 1 (3) Erythematous stuck-on, waxy papule or plaque  upper mid chest x 1, left temple x 1, left zygoma x 2, left ear x 1 (5) Erythematous with slightly thickened gritty scale  right anterior neck Subcutaneous nodule.   left forearm 0.5 cm pink papule        right forearm 0.9 cm pink papule         Assessment & Plan  Inflamed seborrheic keratosis (3) right temporal scalp above ear x 1, right lower eyelid  x 1, right thigh anterior x 1  Symptomatic, irritating, patient would like treated.  With possible wart virus at right lower eyelid  Destruction of lesion - right temporal scalp above ear x 1, right lower eyelid  x 1, right thigh anterior x 1  Destruction method: cryotherapy   Informed consent: discussed and consent obtained   Lesion destroyed using liquid nitrogen: Yes   Region frozen until ice ball extended beyond lesion: Yes   Outcome:  patient tolerated procedure well with no complications   Post-procedure details: wound care instructions given   Additional details:  Prior to procedure, discussed risks of blister formation, small wound, skin dyspigmentation, or rare scar following cryotherapy. Recommend Vaseline ointment to treated areas while healing.   Actinic keratosis (5) upper mid chest x 1, left temple x 1, left zygoma x 2, left ear x 1  Slightly hypertrophic  Actinic keratoses are precancerous spots that appear secondary to cumulative UV radiation exposure/sun exposure over time. They are chronic with expected duration over 1 year. A portion of actinic keratoses will progress to squamous cell carcinoma of the skin. It is not possible to reliably predict which spots will progress to skin cancer and so treatment is recommended to prevent development of skin cancer.  Recommend daily broad spectrum sunscreen SPF 30+ to sun-exposed areas, reapply every 2 hours as needed.  Recommend staying in the shade or wearing long sleeves, sun glasses (UVA+UVB protection) and wide brim hats (4-inch brim around the entire circumference of the hat). Call for new or changing lesions.   Destruction of lesion - upper mid chest x 1, left temple x 1, left zygoma x 2, left ear x 1  Destruction method: cryotherapy   Informed consent: discussed and consent obtained   Lesion destroyed using liquid nitrogen: Yes   Cryotherapy cycles:  2 Outcome: patient tolerated  procedure well with no complications   Post-procedure details: wound care instructions given   Additional details:  Prior to procedure, discussed risks of blister formation, small wound, skin dyspigmentation, or rare scar following cryotherapy. Recommend Vaseline ointment to treated areas while healing.   Epidermal inclusion cyst right anterior neck  Favor    Benign-appearing. Exam most consistent with an epidermal inclusion cyst. Discussed that a cyst is a benign growth that  can grow over time and sometimes get irritated or inflamed. Recommend observation if it is not bothersome. Discussed option of surgical excision to remove it if it is growing, symptomatic, or other changes noted. Please call for new or changing lesions so they can be evaluated.    Neoplasm of uncertain behavior (2) left forearm  Skin / nail biopsy Type of biopsy: tangential   Informed consent: discussed and consent obtained   Timeout: patient name, date of birth, surgical site, and procedure verified   Patient was prepped and draped in usual sterile fashion: Area prepped with isopropyl alcohol. Anesthesia: the lesion was anesthetized in a standard fashion   Anesthetic:  1% lidocaine w/ epinephrine 1-100,000 buffered w/ 8.4% NaHCO3 Instrument used: flexible razor blade   Hemostasis achieved with: aluminum chloride   Outcome: patient tolerated procedure well   Post-procedure details: wound care instructions given   Additional details:  Mupirocin and a bandage applied  Specimen 1 - Surgical pathology Differential Diagnosis: r/o bcc   Check Margins: No  right forearm  Skin / nail biopsy Type of biopsy: tangential   Informed consent: discussed and consent obtained   Timeout: patient name, date of birth, surgical site, and procedure verified   Patient was prepped and draped in usual sterile fashion: Area prepped with isopropyl alcohol. Anesthesia: the lesion was anesthetized in a standard fashion   Anesthetic:  1% lidocaine w/ epinephrine 1-100,000 buffered w/ 8.4% NaHCO3 Instrument used: flexible razor blade   Hemostasis achieved with: aluminum chloride   Outcome: patient tolerated procedure well   Post-procedure details: wound care instructions given   Additional details:  Mupirocin and a bandage applied  Specimen 2 - Surgical pathology Differential Diagnosis: r/o bcc   Check Margins: No  R/o bcc    Actinic Damage - Severe, confluent actinic changes with pre-cancerous  actinic keratoses  - Severe, chronic, not at goal, secondary to cumulative UV radiation exposure over time - diffuse scaly erythematous macules and papules with underlying dyspigmentation - Discussed Prescription "Field Treatment" for Severe, Chronic Confluent Actinic Changes with Pre-Cancerous Actinic Keratoses Field treatment involves treatment of an entire area of skin that has confluent Actinic Changes (Sun/ Ultraviolet light damage) and PreCancerous Actinic Keratoses by method of PhotoDynamic Therapy (PDT) and/or prescription Topical Chemotherapy agents such as 5-fluorouracil, 5-fluorouracil/calcipotriene, and/or imiquimod.  The purpose is to decrease the number of clinically evident and subclinical PreCancerous lesions to prevent progression to development of skin cancer by chemically destroying early precancer changes that may or may not be visible.  It has been shown to reduce the risk of developing skin cancer in the treated area. As a result of treatment, redness, scaling, crusting, and open sores may occur during treatment course. One or more than one of these methods may be used and may have to be used several times to control, suppress and eliminate the PreCancerous changes. Discussed treatment course, expected reaction, and possible side effects. - Recommend daily broad spectrum sunscreen SPF 30+ to sun-exposed areas, reapply every 2 hours as needed.  - Staying in the shade  or wearing long sleeves, sun glasses (UVA+UVB protection) and wide brim hats (4-inch brim around the entire circumference of the hat) are also recommended. - Call for new or changing lesions. - Start 5-fluorouracil/calcipotriene cream twice a day for 4 days to affected areas including left medial cheek . Prescription sent to [facility]. Patient advised they will receive an email to purchase the medication online and have it sent to their home. Patient provided with handout reviewing treatment course  and side effects and advised to call or message [place] on MyChart with any concerns.    Return for keep follow up as scheduled . I, Ruthell Rummage, CMA, am acting as scribe for Forest Gleason, MD.  Documentation: I have reviewed the above documentation for accuracy and completeness, and I agree with the above.  Forest Gleason, MD

## 2022-06-03 NOTE — Patient Instructions (Addendum)
- Start 5-fluorouracil/calcipotriene cream twice a day for 4 days to affected areas including left medial cheek . Prescription sent to [facility]. Patient advised they will receive an email to purchase the medication online and have it sent to their home. Patient provided with handout reviewing treatment course and side effects and advised to call or message [place] on MyChart with any concerns.   5-Fluorouracil/Calcipotriene Patient Education   Actinic keratoses are the dry, red scaly spots on the skin caused by sun damage. A portion of these spots can turn into skin cancer with time, and treating them can help prevent development of skin cancer.   Treatment of these spots requires removal of the defective skin cells. There are various ways to remove actinic keratoses, including freezing with liquid nitrogen, treatment with creams, or treatment with a blue light procedure in the office.   5-fluorouracil cream is a topical cream used to treat actinic keratoses. It works by interfering with the growth of abnormal fast-growing skin cells, such as actinic keratoses. These cells peel off and are replaced by healthy ones.   5-fluorouracil/calcipotriene is a combination of the 5-fluorouracil cream with a vitamin D analog cream called calcipotriene. The calcipotriene alone does not treat actinic keratoses. However, when it is combined with 5-fluorouracil, it helps the 5-fluorouracil treat the actinic keratoses much faster so that the same results can be achieved with a much shorter treatment time.  INSTRUCTIONS FOR 5-FLUOROURACIL/CALCIPOTRIENE CREAM:   5-fluorouracil/calcipotriene cream typically only needs to be used for 4-7 days. A thin layer should be applied twice a day to the treatment areas recommended by your physician.   If your physician prescribed you separate tubes of 5-fluourouracil and calcipotriene, apply a thin layer of 5-fluorouracil followed by a thin layer of  calcipotriene.   Avoid contact with your eyes, nostrils, and mouth. Do not use 5-fluorouracil/calcipotriene cream on infected or open wounds.   You will develop redness, irritation and some crusting at areas where you have pre-cancer damage/actinic keratoses. IF YOU DEVELOP PAIN, BLEEDING, OR SIGNIFICANT CRUSTING, STOP THE TREATMENT EARLY - you have already gotten a good response and the actinic keratoses should clear up well.  Wash your hands after applying 5-fluorouracil 5% cream on your skin.   A moisturizer or sunscreen with a minimum SPF 30 should be applied each morning.   Once you have finished the treatment, you can apply a thin layer of Vaseline twice a day to irritated areas to soothe and calm the areas more quickly. If you experience significant discomfort, contact your physician.  For some patients it is necessary to repeat the treatment for best results.  SIDE EFFECTS: When using 5-fluorouracil/calcipotriene cream, you may have mild irritation, such as redness, dryness, swelling, or a mild burning sensation. This usually resolves within 2 weeks. The more actinic keratoses you have, the more redness and inflammation you can expect during treatment. Eye irritation has been reported rarely. If this occurs, please let us know.  If you have any trouble using this cream, please call the office. If you have any other questions about this information, please do not hesitate to ask me before you leave the office.    Biopsy Wound Care Instructions  Leave the original bandage on for 24 hours if possible.  If the bandage becomes soaked or soiled before that time, it is OK to remove it and examine the wound.  A small amount of post-operative bleeding is normal.  If excessive bleeding occurs, remove the bandage,  place gauze over the site and apply continuous pressure (no peeking) over the area for 30 minutes. If this does not work, please call our clinic as soon as possible or page your doctor if  it is after hours.   Once a day, cleanse the wound with soap and water. It is fine to shower. If a thick crust develops you may use a Q-tip dipped into dilute hydrogen peroxide (mix 1:1 with water) to dissolve it.  Hydrogen peroxide can slow the healing process, so use it only as needed.    After washing, apply petroleum jelly (Vaseline) or an antibiotic ointment if your doctor prescribed one for you, followed by a bandage.    For best healing, the wound should be covered with a layer of ointment at all times. If you are not able to keep the area covered with a bandage to hold the ointment in place, this may mean re-applying the ointment several times a day.  Continue this wound care until the wound has healed and is no longer open.   Itching and mild discomfort is normal during the healing process. However, if you develop pain or severe itching, please call our office.   If you have any discomfort, you can take Tylenol (acetaminophen) or ibuprofen as directed on the bottle. (Please do not take these if you have an allergy to them or cannot take them for another reason).  Some redness, tenderness and white or yellow material in the wound is normal healing.  If the area becomes very sore and red, or develops a thick yellow-green material (pus), it may be infected; please notify us.    If you have stitches, return to clinic as directed to have the stitches removed. You will continue wound care for 2-3 days after the stitches are removed.   Wound healing continues for up to one year following surgery. It is not unusual to experience pain in the scar from time to time during the interval.  If the pain becomes severe or the scar thickens, you should notify the office.    A slight amount of redness in a scar is expected for the first six months.  After six months, the redness will fade and the scar will soften and fade.  The color difference becomes less noticeable with time.  If there are any problems,  return for a post-op surgery check at your earliest convenience.  To improve the appearance of the scar, you can use silicone scar gel, cream, or sheets (such as Mederma or Serica) every night for up to one year. These are available over the counter (without a prescription).  Please call our office at [phone] for any questions or concerns.        Actinic keratoses are precancerous spots that appear secondary to cumulative UV radiation exposure/sun exposure over time. They are chronic with expected duration over 1 year. A portion of actinic keratoses will progress to squamous cell carcinoma of the skin. It is not possible to reliably predict which spots will progress to skin cancer and so treatment is recommended to prevent development of skin cancer.  Recommend daily broad spectrum sunscreen SPF 30+ to sun-exposed areas, reapply every 2 hours as needed.  Recommend staying in the shade or wearing long sleeves, sun glasses (UVA+UVB protection) and wide brim hats (4-inch brim around the entire circumference of the hat). Call for new or changing lesions.      Seborrheic Keratosis  What causes seborrheic keratoses? Seborrheic keratoses are harmless,  common skin growths that first appear during adult life.  As time goes by, more growths appear.  Some people may develop a large number of them.  Seborrheic keratoses appear on both covered and uncovered body parts.  They are not caused by sunlight.  The tendency to develop seborrheic keratoses can be inherited.  They vary in color from skin-colored to gray, brown, or even black.  They can be either smooth or have a rough, warty surface.   Seborrheic keratoses are superficial and look as if they were stuck on the skin.  Under the microscope this type of keratosis looks like layers upon layers of skin.  That is why at times the top layer may seem to fall off, but the rest of the growth remains and re-grows.    Treatment Seborrheic keratoses do  not need to be treated, but can easily be removed in the office.  Seborrheic keratoses often cause symptoms when they rub on clothing or jewelry.  Lesions can be in the way of shaving.  If they become inflamed, they can cause itching, soreness, or burning.  Removal of a seborrheic keratosis can be accomplished by freezing, burning, or surgery. If any spot bleeds, scabs, or grows rapidly, please return to have it checked, as these can be an indication of a skin cancer.  Cryotherapy Aftercare  Wash gently with soap and water everyday.   Apply Vaseline and Band-Aid daily until healed.   Due to recent changes in healthcare laws, you may see results of your pathology and/or laboratory studies on MyChart before the doctors have had a chance to review them. We understand that in some cases there may be results that are confusing or concerning to you. Please understand that not all results are received at the same time and often the doctors may need to interpret multiple results in order to provide you with the best plan of care or course of treatment. Therefore, we ask that you please give [place] 2 business days to thoroughly review all your results before contacting the office for clarification. Should we see a critical lab result, you will be contacted sooner.   If You Need Anything After Your Visit  If you have any questions or concerns for your doctor, please call our main line at [phone] and press option 4 to reach your doctor's medical assistant. If no one answers, please leave a voicemail as directed and we will return your call as soon as possible. Messages left after 4 pm will be answered the following business day.   You may also send [place] a message via [place]. We typically respond to MyChart messages within 1-2 business days.  For prescription refills, please ask your pharmacy to contact our office. Our fax number is [phone].  If you have an urgent issue when the clinic is closed that  cannot wait until the next business day, you can page your doctor at the number below.    Please note that while we do our best to be available for urgent issues outside of office hours, we are not available 24/7.   If you have an urgent issue and are unable to reach [place], you may choose to seek medical care at your doctor's office, retail clinic, urgent care center, or emergency room.  If you have a medical emergency, please immediately call 911 or go to the emergency department.  Pager Numbers  - Dr. Nehemiah Massed: [phone]  - Dr. Laurence Ferrari: [phone]  - Dr. Nicole Kindred: [phone]  In  the event of inclement weather, please call our main line at [phone] for an update on the status of any delays or closures.  Dermatology Medication Tips: Please keep the boxes that topical medications come in in order to help keep track of the instructions about where and how to use these. Pharmacies typically print the medication instructions only on the boxes and not directly on the medication tubes.   If your medication is too expensive, please contact our office at [phone] option 4 or send [place] a message through [place].   We are unable to tell what your co-pay for medications will be in advance as this is different depending on your insurance coverage. However, we may be able to find a substitute medication at lower cost or fill out paperwork to get insurance to cover a needed medication.   If a prior authorization is required to get your medication covered by your insurance company, please allow [place] 1-2 business days to complete this process.  Drug prices often vary depending on where the prescription is filled and some pharmacies may offer cheaper prices.  The website www.goodrx.com contains coupons for medications through different pharmacies. The prices here do not account for what the cost may be with help from insurance (it may be cheaper with your insurance), but the website can give you the  price if you did not use any insurance.  - You can print the associated coupon and take it with your prescription to the pharmacy.  - You may also stop by our office during regular business hours and pick up a GoodRx coupon card.  - If you need your prescription sent electronically to a different pharmacy, notify our office through [facility] or by phone at [phone] option 4.     Si Usted Necesita Algo Despus de Su Visita  Tambin puede enviarnos un mensaje a travs de Pharmacist, community. Por lo general respondemos a los mensajes de MyChart en el transcurso de 1 a 2 das hbiles.  Para renovar recetas, por favor pida a su farmacia que se ponga en contacto con nuestra oficina. Harland Dingwall de fax es [place] [phone].  Si tiene un asunto urgente cuando la clnica est cerrada y que no puede esperar hasta el siguiente da hbil, puede llamar/localizar a su doctor(a) al nmero que aparece a continuacin.   Por favor, tenga en cuenta que aunque hacemos todo lo posible para estar disponibles para asuntos urgentes fuera del horario de [place], no estamos disponibles las 24 horas del da, los 7 das de la [place].   Si tiene un problema urgente y no puede comunicarse con nosotros, puede optar por buscar atencin mdica  en el consultorio de su doctor(a), en una clnica privada, en un centro de atencin urgente o en una sala de emergencias.  Si tiene Engineering geologist, por favor llame inmediatamente al 911 o vaya a la sala de emergencias.  Nmeros de bper  - Dr. Nehemiah Massed: [phone]  - Dra. Moye: [phone]  - Dra. Nicole Kindred: [phone]  En caso de inclemencias del [place], por favor llame a Johnsie Kindred principal al [phone] para una actualizacin sobre el [place] de cualquier retraso o cierre.  Consejos para la medicacin en dermatologa: Por favor, guarde las cajas en las que vienen los medicamentos de uso tpico para ayudarle a seguir las instrucciones sobre dnde y cmo  usarlos. Las farmacias generalmente imprimen las instrucciones del medicamento slo en las cajas y no directamente en los tubos del [place].   Si  su medicamento es muy caro, por favor, pngase en contacto con Zigmund Daniel llamando al [phone] y presione la opcin 4 o envenos un mensaje a travs de Pharmacist, community.   No podemos decirle cul ser su copago por los medicamentos por adelantado ya que esto es diferente dependiendo de la cobertura de su seguro. Sin embargo, es posible que podamos encontrar un medicamento sustituto a Electrical engineer un formulario para que el seguro cubra el medicamento que se considera necesario.   Si se requiere una autorizacin previa para que su compaa de seguros [place] su medicamento, por favor permtanos de 1 a 2 das hbiles para completar este proceso.  Los precios de los medicamentos varan con frecuencia dependiendo del Environmental consultant de dnde se surte la receta y alguna farmacias pueden ofrecer precios ms baratos.  El sitio web www.goodrx.com tiene cupones para medicamentos de Airline pilot. Los precios aqu no tienen en cuenta lo que podra costar con la ayuda del seguro (puede ser ms barato con su seguro), pero el sitio web puede darle el precio si no utiliz Research scientist (physical sciences).  - Puede imprimir el cupn correspondiente y llevarlo con su receta a la farmacia.  - Tambin puede pasar por nuestra oficina durante el horario de atencin regular y Charity fundraiser una tarjeta de cupones de GoodRx.  - Si necesita que su receta se enve electrnicamente a una farmacia diferente, informe a nuestra oficina a travs de MyChart de [place] o por telfono llamando al [phone] y presione la opcin 4.

## 2022-06-09 ENCOUNTER — Telehealth: Payer: Self-pay

## 2022-06-09 NOTE — Telephone Encounter (Signed)
-----   Message from Alfonso Patten, MD sent at 06/09/2022  9:06 AM EDT ----- 1. Skin , left forearm BASAL CELL CARCINOMA, SUPERFICIAL AND NODULAR PATTERNS 2. Skin , right forearm BASAL CELL CARCINOMA, SUPERFICIAL AND NODULAR PATTERNS  Recommend ED&C for both.  MAs please call to discuss and schedule. Thank you!

## 2022-06-09 NOTE — Telephone Encounter (Signed)
Patient advised bx's showed BCC at left and right forearm, scheduled for [facility] 06/23/22. Lurlean Horns., RMA

## 2022-06-15 ENCOUNTER — Encounter: Payer: Self-pay | Admitting: Dermatology

## 2022-06-15 NOTE — Progress Notes (Unsigned)
Cardiology Office Note    Date:  06/16/2022   ID:  Bee, [name] [birth year], MRN [id number]  PCP:  [name], [name], MD  Cardiologist:  Ida Rogue, MD  Electrophysiologist:  None   Chief Complaint: Follow-up  History of Present Illness:   Jackson [name] is a 71 y.o. male with history of SVT, DM2, HLD with statin intolerance, thyroid nodules, and sleep apnea who presents for follow-up of coronary CTA.   He previously underwent stress echo through [facility] in 2010, for preoperative cardiac risk stratification, with results being unavailable for review.  He established care with Dr. Rockey Situ in 2019 for cardiac risk stratification.  CT imaging at that time showed no evidence of coronary artery calcification or aortic atherosclerosis with minimal calcification of the aortic valve noted.  He has been maintained on metoprolol for SVT.  More recently, he was evaluated by his PCP on 04/24/2022 with shortness of breath and chest pain.  Echo was ordered, which demonstrated an EF of 55 to 60%, no regional wall motion abnormalities, grade 1 diastolic dysfunction, normal RV systolic function and ventricular cavity size, mildly dilated left atrium, aortic valve sclerosis without evidence of stenosis, and borderline dilatation of the aortic root and ascending aorta.  He was evaluated by our office on 05/01/2022 with continued progressive exertional shortness of breath, as reported by his wife.  The patient indicated he had not significantly noticed a change in his breathing.  He was able to ride a recumbent bike without symptoms of dyspnea.  No symptoms similar to his prior SVT.  He did note some intermittent chest tightness that was randomly occurring, though would also occur when carrying heavy objects.  Coronary CTA on 05/07/2022 showed no significant extracardiac findings.  Cardiac portion showed a calcium score of 0 with no evidence of CAD as well as a normal size aorta.   He comes in doing well  from a cardiac perspective and is without symptoms of angina or decompensation.  No further dyspnea or chest discomfort.  No palpitations, dizziness, presyncope, or syncope.  No significant lower extremity swelling.  More recently, he has not been riding his bike, though does plan to resume this.  He remains active.  Overall, he feels well and does not have any further cardiac issues or concerns at this time.   Labs independently reviewed: 04/2022 - potassium 3.8, BUN 16, serum creatinine 1.19, albumin 4.2, AST/ALT normal, TC 175, TG 186, HDL 28, LDL 109, A1c 6.8, Hgb 14.6, PLT 192, TSH normal  Past Medical History:  Diagnosis Date   Actinic keratosis    Allergy    Anemia    Arthritis    Basal cell carcinoma    BCC (basal cell carcinoma) 06/03/2022   left forearm, needs EDC   BCC (basal cell carcinoma) 06/03/2022   right forearm, needs EDC   Cancer ([place])    BASAL CELL-FACE   Complication of anesthesia    PT IS A RED HEAD AND STATES IT TAKES ALOT MORE ANESTHESIA TO SEDATE HIM   Diabetes mellitus without complication ([place])    Dysrhythmia    SVT   Fatty liver    GERD (gastroesophageal reflux disease)    Hyperlipidemia    Melanoma ([place]) 02/02/2019   Left forearm. Superficial spreading. Tumor thickness 0.71m, anatomic level III. Excised: 02/22/2019, margins free.   Multiple thyroid nodules    Rheumatic fever    Sleep apnea 1997   Uses C-Pap machine   SVT (supraventricular tachycardia) (  [place])    Thyroid nodule     Past Surgical History:  Procedure Laterality Date   APPENDECTOMY  1965   BICEPS TENDON REPAIR Right 05/2015   COLONOSCOPY  2016   LUMBAR FUSION  2015   L4/L5   RHINOPLASTY  1997   SHOULDER SURGERY Left    2001   TONSILLECTOMY  1956   VASECTOMY  1981    Current Medications: Current Meds  Medication Sig   azelastine (ASTELIN) 0.1 % nasal spray USE 1 SPRAY IN EACH NOSTRIL TWICE A DAY AS DIRECTED   cetirizine (ZYRTEC) 10 MG tablet Take 10 mg by mouth at bedtime.     cholecalciferol (VITAMIN D3) 25 MCG (1000 UT) tablet Take 1,000 Units by mouth daily.   Coenzyme Q10 (COQ10) 200 MG CAPS Take 200 mg by mouth daily.    Continuous Blood Gluc Sensor (FREESTYLE LIBRE 2 SENSOR) MISC USE TO CHECK BLOOD SUGAR EVERY 8 HOURS   Dulaglutide (TRULICITY) 4.16 SA/6.3KZ SOPN INJECT 0.5 ML (0.75 MG TOTAL) UNDER THE SKIN ONCE A WEEK   ibuprofen (ADVIL) 600 MG tablet Take 1 tablet (600 mg total) by mouth every 6 (six) hours as needed.   metFORMIN (GLUCOPHAGE) 1000 MG tablet Take 1 tablet (1,000 mg total) by mouth daily. With food   metoprolol succinate (TOPROL-XL) 50 MG 24 hr tablet TAKE 1 TABLET DAILY WITH OR IMMEDIATELY FOLLOWING A MEAL (DOSE CHANGE)   Multiple Vitamins-Minerals (MULTIVITAMIN ADULT PO) Take 1 tablet by mouth daily.    Omega-3 Fatty Acids (FISH OIL) 1200 MG CPDR Take 1 tablet by mouth daily.   omeprazole (PRILOSEC) 40 MG capsule Take 1 capsule (40 mg total) by mouth daily. 30 min before food   Red Yeast Rice 600 MG TABS Take 600 mg by mouth 2 (two) times daily with a meal.   tamsulosin (FLOMAX) 0.4 MG CAPS capsule Take 1 capsule (0.4 mg total) by mouth at bedtime.   triamcinolone ointment (KENALOG) 0.1 % Apply 1 application topically daily. As needed and cover with bandaid. Avoid face, groin, underarms.   vitamin C (ASCORBIC ACID) 500 MG tablet Take 1,000 mg by mouth daily.    Vitamin E 400 units TABS Take 400 Units by mouth daily.     Allergies:   Januvia [sitagliptin], Lipitor [atorvastatin], and Statins   Social History   Socioeconomic History   Marital status: Married    Spouse name: Not on file   Number of children: Not on file   Years of education: Not on file   Highest education level: Not on file  Occupational History   Not on file  Tobacco Use   Smoking status: Never   Smokeless tobacco: Former    Types: Snuff  Vaping Use   Vaping Use: Never used  Substance and Sexual Activity   Alcohol use: Yes    Alcohol/week: 1.0 - 2.0 standard  drink of alcohol    Types: 1 - 2 Cans of beer per week    Comment: OCC   Drug use: No   Sexual activity: Not Currently  Other Topics Concern   Not on file  Social History Narrative   Married    Former Nature conservation officer    2 daughters   Social Determinants of Health   Financial Resource Strain: Low Risk  (05/09/2021)   Overall Financial Resource Strain (CARDIA)    Difficulty of Paying Living Expenses: Not hard at all  Food Insecurity: No Food Insecurity (09/09/2020)   Hunger Vital Sign    Worried  About Running Out of Food in the Last Year: Never true    Ran Out of Food in the Last Year: Never true  Transportation Needs: No Transportation Needs (09/09/2020)   PRAPARE - Hydrologist (Medical): No    Lack of Transportation (Non-Medical): No  Physical Activity: Unknown (09/01/2018)   Exercise Vital Sign    Days of Exercise per Week: 3 days    Minutes of Exercise per Session: Not on file  Stress: No Stress Concern Present (09/09/2020)   [place]    Feeling of Stress : Not at all  Social Connections: Unknown (09/09/2020)   Social Connection and Isolation Panel [NHANES]    Frequency of Communication with Friends and Family: Not on file    Frequency of Social Gatherings with Friends and Family: Not on file    Attends Religious Services: Not on file    Active Member of Clubs or Organizations: Not on file    Attends Archivist Meetings: Not on file    Marital Status: Married     Family History:  The patient's family history includes Alcohol abuse in his maternal grandfather; Arthritis in his father and mother; Cholelithiasis in his brother, daughter, and mother; Diabetes in his father and mother; Drug abuse in his cousin; Hyperlipidemia in his maternal grandfather, maternal grandmother, mother, and another family member; Lung cancer in his mother; Lung disease in his father; Pancreatitis in  his father.  ROS:   12-review of systems is negative unless otherwise noted in the HPI.   EKGs/Labs/Other Studies Reviewed:    Studies reviewed were summarized above. The additional studies were reviewed today:  2D echo 05/07/2022: 1. Left ventricular ejection fraction, by estimation, is 55 to 60%. The  left ventricle has normal function. The left ventricle has no regional  wall motion abnormalities. Left ventricular diastolic parameters are  consistent with Grade I diastolic  dysfunction (impaired relaxation).   2. Right ventricular systolic function is normal. The right ventricular  size is normal.   3. Left atrial size was mildly dilated.   4. There is no evidence of cardiac tamponade.   5. The mitral valve is normal in structure. No evidence of mitral valve  regurgitation. No evidence of mitral stenosis.   6. The aortic valve has an indeterminant number of cusps. Aortic valve  regurgitation is not visualized. Aortic valve sclerosis is present, with  no evidence of aortic valve stenosis.   7. There is borderline dilatation of the aortic root and of the ascending  aorta, measuring 37 mm.   8. The inferior vena cava is normal in size with greater than 50%  respiratory variability, suggesting right atrial pressure of 3 mmHg. __________  Coronary CTA 05/07/2022: FINDINGS: Aorta:  Normal size.  No calcifications.  No dissection.   Aortic Valve:  Trileaflet.  No calcifications.   Coronary Arteries:  Normal coronary origin.  Right dominance.   RCA is a dominant artery that gives rise to PDA and PLA. There is no plaque.   Left main gives rise to LAD and LCX arteries.  LM has no disease.   LAD has no plaque.   LCX is a non-dominant artery that gives rise to two obtuse marginal branches. There is no plaque.   Other findings:   Normal pulmonary vein drainage into the left atrium.   Normal left atrial appendage without a thrombus.   Normal size of the pulmonary artery.  IMPRESSION: 1. Normal coronary calcium score of 0. Patient is low risk for coronary events. 2. Normal coronary origin with right dominance. 3. No evidence of CAD. 4. CAD-RADS 0. Consider non-atherosclerotic causes of chest pain.    EKG:  EKG is ordered today.  The EKG ordered today demonstrates NSR, 69 bpm, anterior T wave inversion  Recent Labs: 04/27/2022: ALT 25; BUN 16; Creatinine, Ser 1.19; Hemoglobin 14.6; Platelets 192.0; Potassium 3.8; Sodium 139; TSH 2.18  Recent Lipid Panel    Component Value Date/Time   CHOL 175 04/27/2022 0734   TRIG 186.0 (H) 04/27/2022 0734   HDL 28.60 (L) 04/27/2022 0734   CHOLHDL 6 04/27/2022 0734   VLDL 37.2 04/27/2022 0734   LDLCALC 109 (H) 04/27/2022 0734   LDLDIRECT 155.0 07/19/2020 0803    PHYSICAL EXAM:    VS:  BP 128/82 (BP Location: Left Arm, Patient Position: Sitting, Cuff Size: Large)   Pulse 69   Ht '6\' 1"'$  (1.854 m)   Wt 244 lb (110.7 kg)   SpO2 97%   BMI 32.19 kg/m   BMI: Body mass index is 32.19 kg/m.  Physical Exam Vitals reviewed.  Constitutional:      Appearance: He is well-developed.  HENT:     Head: Normocephalic and atraumatic.  Eyes:     General:        Right eye: No discharge.        Left eye: No discharge.  Neck:     Vascular: No JVD.  Cardiovascular:     Rate and Rhythm: Normal rate and regular rhythm.     Pulses:          Posterior tibial pulses are 2+ on the right side and 2+ on the left side.     Heart sounds: Normal heart sounds, S1 normal and S2 normal. Heart sounds not distant. No midsystolic click and no opening snap. No murmur heard.    No friction rub.  Pulmonary:     Effort: Pulmonary effort is normal. No respiratory distress.     Breath sounds: Normal breath sounds. No decreased breath sounds, wheezing or rales.  Chest:     Chest wall: No tenderness.  Abdominal:     General: There is no distension.  Musculoskeletal:     Cervical back: Normal range of motion.     Right lower leg: No edema.      Left lower leg: No edema.  Skin:    General: Skin is warm and dry.     Nails: There is no clubbing.  Neurological:     Mental Status: He is alert and oriented to person, place, and time.  Psychiatric:        Speech: Speech normal.        Behavior: Behavior normal.        Thought Content: Thought content normal.        Judgment: Judgment normal.     Wt Readings from Last 3 Encounters:  06/16/22 244 lb (110.7 kg)  05/01/22 239 lb (108.4 kg)  04/24/22 242 lb (109.8 kg)     ASSESSMENT & PLAN:   Chest discomfort: No further issues.  Coronary CTA showed a calcium score of 0 with no evidence of coronary artery disease.  Continue aggressive risk factor modification and primary prevention along with active lifestyle.  No indication for further ischemic testing at this time.  SVT: Quiescent on Toprol-XL.  HLD: LDL 109.  Intolerant to low and high intensity statins.  Calcium score of 0  on recent coronary CTA.  Continue with lifestyle modification.   Disposition: F/u with Dr. Rockey Situ or an APP in 12 months.   Medication Adjustments/Labs and Tests Ordered: Current medicines are reviewed at length with the patient today.  Concerns regarding medicines are outlined above. Medication changes, Labs and Tests ordered today are summarized above and listed in the Patient Instructions accessible in Encounters.   Signed, Christell Faith, PA-C 06/16/2022 4:00 PM     [address] [place], [place] [address] [phone]

## 2022-06-16 ENCOUNTER — Ambulatory Visit: Payer: Medicare Other | Admitting: Medical

## 2022-06-16 ENCOUNTER — Ambulatory Visit (INDEPENDENT_AMBULATORY_CARE_PROVIDER_SITE_OTHER): Payer: Medicare Other | Admitting: Physician Assistant

## 2022-06-16 ENCOUNTER — Encounter: Payer: Self-pay | Admitting: Physician Assistant

## 2022-06-16 VITALS — BP 128/82 | HR 69 | Ht 73.0 in | Wt 244.0 lb

## 2022-06-16 DIAGNOSIS — E782 Mixed hyperlipidemia: Secondary | ICD-10-CM

## 2022-06-16 DIAGNOSIS — R0789 Other chest pain: Secondary | ICD-10-CM | POA: Diagnosis not present

## 2022-06-16 DIAGNOSIS — I208 Other forms of angina pectoris: Secondary | ICD-10-CM | POA: Diagnosis not present

## 2022-06-16 DIAGNOSIS — I471 Supraventricular tachycardia: Secondary | ICD-10-CM

## 2022-06-16 NOTE — Patient Instructions (Signed)
Medication Instructions:  Your physician recommends that you continue on your current medications as directed. Please refer to the Current Medication list given to you today.  *If you need a refill on your cardiac medications before your next appointment, please call your pharmacy*   Lab Work: None  If you have labs (blood work) drawn today and your tests are completely normal, you will receive your results only by: [place] (if you have MyChart) OR A paper copy in the mail If you have any lab test that is abnormal or we need to change your treatment, we will call you to review the results.   Testing/Procedures: None   Follow-Up: At [facility], you and your health needs are our priority.  As part of our continuing mission to provide you with exceptional heart care, we have created designated Provider Care Teams.  These Care Teams include your primary Cardiologist (physician) and Advanced Practice Providers (APPs -  Physician Assistants and Nurse Practitioners) who all work together to provide you with the care you need, when you need it.  Your next appointment:   1 year(s)  The format for your next appointment:   In Person  Provider:   Ida Rogue, MD or Christell Faith, PA-C       Important Information About Sugar

## 2022-06-18 DIAGNOSIS — E042 Nontoxic multinodular goiter: Secondary | ICD-10-CM | POA: Diagnosis not present

## 2022-06-21 ENCOUNTER — Ambulatory Visit
Admission: EM | Admit: 2022-06-21 | Discharge: 2022-06-21 | Disposition: A | Discharge status: Home / Self Care | Payer: Medicare Other | Attending: Emergency Medicine | Admitting: Emergency Medicine

## 2022-06-21 DIAGNOSIS — L03031 Cellulitis of right toe: Secondary | ICD-10-CM

## 2022-06-21 MED ORDER — DOXYCYCLINE HYCLATE 100 MG PO CAPS: 100.0000 mg | ORAL_CAPSULE | Freq: Two times a day (BID) | ORAL | 0 refills | Status: AC

## 2022-06-21 NOTE — ED Provider Notes (Signed)
[facility]    CSN: [id number] Arrival date & time: 06/21/22  1451      History   Chief Complaint Chief Complaint  Patient presents with   Toe Pain    HPI Jackson [name] is a 71 y.o. male.  Patient presents with pain and swelling of his right great toe beside the toenail x4 days.  He had some purulent drainage 2 days ago.  No falls or injury.  No fever, chills, numbness, weakness, paresthesias, or other symptoms.  Treatment attempted at home with antibiotic ointment.  Patient is diabetic.  The history is provided by the patient and medical records.    Past Medical History:  Diagnosis Date   Actinic keratosis    Allergy    Anemia    Arthritis    Basal cell carcinoma    BCC (basal cell carcinoma) 06/03/2022   left forearm, needs EDC   BCC (basal cell carcinoma) 06/03/2022   right forearm, needs EDC   Cancer ([place])    BASAL CELL-FACE   Complication of anesthesia    PT IS A RED HEAD AND STATES IT TAKES ALOT MORE ANESTHESIA TO SEDATE HIM   Diabetes mellitus without complication ([place])    Dysrhythmia    SVT   Fatty liver    GERD (gastroesophageal reflux disease)    Hyperlipidemia    Melanoma ([place]) 02/02/2019   Left forearm. Superficial spreading. Tumor thickness 0.53m, anatomic level III. Excised: 02/22/2019, margins free.   Multiple thyroid nodules    Rheumatic fever    Sleep apnea 1997   Uses C-Pap machine   SVT (supraventricular tachycardia) (HCC)    Thyroid nodule     Patient Active Problem List   Diagnosis Date Noted   Arthritis of left knee 04/29/2022   IPMN (intraductal papillary mucinous neoplasm) 05/14/2021   Kidney cysts 05/13/2021   Pancreatic cyst 05/13/2021   Gallstone pancreatitis 04/30/2021   Cholecystitis    Abnormal MRI, lumbar spine 02/17/2021   Iron deficiency 12/30/2020   Statin intolerance 08/23/2020   Hypertension associated with diabetes ([facility] 07/24/2020   Statin myopathy 03/20/2020   Type 2 diabetes mellitus with  hyperglycemia, without long-term current use of insulin ([facility] 12/19/2019   Elevated PSA 06/13/2019   Iron deficiency anemia 06/13/2019   Basal cell carcinoma of skin 03/02/2019   Melanoma of skin ([facility] 02/21/2019   AK (actinic keratosis) 11/23/2018   Vertigo 11/23/2018   Allergic rhinitis 07/04/2018   Fatty liver 03/03/2018   Bradycardia 03/03/2018   Fatigue 01/23/2018   Benign prostatic hyperplasia without lower urinary tract symptoms 01/23/2018   Type 2 diabetes mellitus without complication, without long-term current use of insulin ([facility] 01/23/2018   Thyroid nodule 01/20/2018   Hyperlipidemia 11/26/2016   DM type 2 with diabetic peripheral neuropathy ([facility] 05/27/2016   Paroxysmal SVT (supraventricular tachycardia) ([facility] 05/27/2016   GERD (gastroesophageal reflux disease) 05/27/2016   OSA (obstructive sleep apnea) 05/27/2016    Past Surgical History:  Procedure Laterality Date   [place] 05/2015   COLONOSCOPY  2016   LUMBAR FUSION  2015   L4/L5   RHINOPLASTY  1997   SHOULDER SURGERY Left    2001   [place]    Prior to Admission medications   Medication Sig Start Date End Date Taking? Authorizing Provider  doxycycline (VIBRAMYCIN) 100 MG capsule Take 1 capsule (100 mg total)  by mouth 2 (two) times daily for 7 days. 06/21/22 06/28/22 Yes Sharion Balloon, NP  azelastine (ASTELIN) 0.1 % nasal spray USE 1 SPRAY IN EACH NOSTRIL TWICE A DAY AS DIRECTED 06/01/22   Dutch Quint B, FNP  cetirizine (ZYRTEC) 10 MG tablet Take 10 mg by mouth at bedtime.     [provider]  cholecalciferol (VITAMIN D3) 25 MCG (1000 UT) tablet Take 1,000 Units by mouth daily.    [provider]  Coenzyme Q10 (COQ10) 200 MG CAPS Take 200 mg by mouth daily.     [provider]  Continuous Blood Gluc Sensor (FREESTYLE LIBRE 2 SENSOR) MISC USE TO CHECK BLOOD SUGAR EVERY 8 HOURS 01/21/22    [name], [name], MD  Dulaglutide (TRULICITY) 9.70 YO/3.7CH SOPN INJECT 0.5 ML (0.75 MG TOTAL) UNDER THE SKIN ONCE A WEEK 04/29/22   [name], [name], MD  ibuprofen (ADVIL) 600 MG tablet Take 1 tablet (600 mg total) by mouth every 6 (six) hours as needed. 05/02/21   Tylene Fantasia, PA-C  metFORMIN (GLUCOPHAGE) 1000 MG tablet Take 1 tablet (1,000 mg total) by mouth daily. With food 04/29/22   [name], [name], MD  metoprolol succinate (TOPROL-XL) 50 MG 24 hr tablet TAKE 1 TABLET DAILY WITH OR IMMEDIATELY FOLLOWING A MEAL (DOSE CHANGE) 01/26/22   [name], [name], MD  metoprolol tartrate (LOPRESSOR) 100 MG tablet Take 1 tablet (100 mg total) by mouth once for 1 dose. Take 2 hours before your test. 05/01/22 05/01/22  Rise Mu, PA-C  Multiple Vitamins-Minerals (MULTIVITAMIN ADULT PO) Take 1 tablet by mouth daily.     [provider]  Omega-3 Fatty Acids (FISH OIL) 1200 MG CPDR Take 1 tablet by mouth daily.    [provider]  omeprazole (PRILOSEC) 40 MG capsule Take 1 capsule (40 mg total) by mouth daily. 30 min before food 05/01/22   [name], [name], MD  Red Yeast Rice 600 MG TABS Take 600 mg by mouth 2 (two) times daily with a meal.    [provider]  tamsulosin (FLOMAX) 0.4 MG CAPS capsule Take 1 capsule (0.4 mg total) by mouth at bedtime. 11/06/21   Billey Co, MD  triamcinolone ointment (KENALOG) 0.1 % Apply 1 application topically daily. As needed and cover with bandaid. Avoid face, groin, underarms. 01/28/21   Moye, [place], MD  vitamin C (ASCORBIC ACID) 500 MG tablet Take 1,000 mg by mouth daily.     [provider]  Vitamin E 400 units TABS Take 400 Units by mouth daily.     [provider]    Family History Family History  Problem Relation Age of Onset   Arthritis Mother    Lung cancer Mother    Hyperlipidemia Mother    Diabetes Mother    Cholelithiasis Mother        gb removed   Arthritis Father     Diabetes Father    Lung disease Father    Pancreatitis Father        died age 16 pancreatitis   Cholelithiasis Brother        GB removed   Hyperlipidemia Maternal Grandmother    Alcohol abuse Maternal Grandfather    Hyperlipidemia Maternal Grandfather    Drug abuse Cousin    Hyperlipidemia Other        maternal side of family    Cholelithiasis Daughter        GB removed    Social History Social History  Tobacco Use   Smoking status: Never   Smokeless tobacco: Former    Types: Snuff  Vaping Use   Vaping Use: Never used  Substance Use Topics   Alcohol use: Yes    Alcohol/week: 1.0 - 2.0 standard drink of alcohol    Types: 1 - 2 Cans of beer per week    Comment: OCC   Drug use: No     Allergies   Januvia [sitagliptin], Lipitor [atorvastatin], and Statins   Review of Systems Review of Systems  Constitutional:  Negative for chills and fever.  Musculoskeletal:  Negative for arthralgias, gait problem and joint swelling.  Skin:  Positive for color change and wound.  Neurological:  Negative for weakness and numbness.  All other systems reviewed and are negative.    Physical Exam Triage Vital Signs ED Triage Vitals  Enc Vitals Group     BP 06/21/22 1537 129/81     Pulse Rate 06/21/22 1537 97     Resp 06/21/22 1537 18     Temp 06/21/22 1537 98.2 F (36.8 C)     Temp src --      SpO2 06/21/22 1537 96 %     Weight 06/21/22 1541 240 lb (108.9 kg)     Height 06/21/22 1541 '6\' 1"'$  (1.854 m)     Head Circumference --      Peak Flow --      Pain Score 06/21/22 1537 6     Pain Loc --      Pain Edu? --      Excl. in [place]? --    No data found.  Updated Vital Signs BP 129/81   Pulse 97   Temp 98.2 F (36.8 C)   Resp 18   Ht '6\' 1"'$  (1.854 m)   Wt 240 lb (108.9 kg)   SpO2 96%   BMI 31.66 kg/m   Visual Acuity Right Eye Distance:   Left Eye Distance:   Bilateral Distance:    Right Eye Near:   Left Eye Near:    Bilateral Near:     Physical Exam Vitals and  nursing note reviewed.  Constitutional:      General: He is not in acute distress.    Appearance: Normal appearance. He is well-developed. He is not ill-appearing.  HENT:     Mouth/Throat:     Mouth: Mucous membranes are moist.  Cardiovascular:     Rate and Rhythm: Normal rate and regular rhythm.  Pulmonary:     Effort: Pulmonary effort is normal. No respiratory distress.  Musculoskeletal:        General: No swelling or deformity. Normal range of motion.     Cervical back: Neck supple.  Skin:    General: Skin is warm and dry.     Capillary Refill: Capillary refill takes less than 2 seconds.     Findings: Erythema and lesion present.     Comments: Right great toe tender and mildly erythematous lateral to toenail.  No open wounds or pus pockets.  Neurological:     General: No focal deficit present.     Mental Status: He is alert and oriented to person, place, and time.     Sensory: No sensory deficit.     Motor: No weakness.     Gait: Gait normal.  Psychiatric:        Mood and Affect: Mood normal.        Behavior: Behavior normal.       UC Treatments /  Results  Labs (all labs ordered are listed, but only abnormal results are displayed) Labs Reviewed - No data to display  EKG   Radiology No results found.  Procedures Procedures (including critical care time)  Medications Ordered in UC Medications - No data to display  Initial Impression / Assessment and Plan / UC Course  I have reviewed the triage vital signs and the nursing notes.  Pertinent labs & imaging results that were available during my care of the patient were reviewed by me and considered in my medical decision making (see chart for details).    Paronychia of right great toe.  Treating with doxycycline.  Instructed patient to follow-up with his PCP or podiatrist in 2 to 3 days for recheck.  Education provided on paronychia.  Patient agrees to plan of care.  Final Clinical Impressions(s) / UC Diagnoses    Final diagnoses:  Paronychia of great toe, right     Discharge Instructions      Take the antibiotic as directed.  Follow up with your primary care provider or a podiatrist in 2-3 days for a recheck .        ED Prescriptions     Medication Sig Dispense Auth. Provider   doxycycline (VIBRAMYCIN) 100 MG capsule Take 1 capsule (100 mg total) by mouth 2 (two) times daily for 7 days. 14 capsule Sharion Balloon, NP      PDMP not reviewed this encounter.   Sharion Balloon, NP 06/21/22 [phone]

## 2022-06-21 NOTE — Discharge Instructions (Addendum)
Take the antibiotic as directed.  Follow up with your primary care provider or a podiatrist in 2-3 days for a recheck .

## 2022-06-21 NOTE — ED Triage Notes (Addendum)
Patient to Urgent Care with complaints of right big toe nail pain, reports pain started 4 days ago. Has been soaking area and applying ointment. Reports the toe has not improved.  Patient is diabetic.

## 2022-06-23 ENCOUNTER — Encounter: Payer: Self-pay | Admitting: Dermatology

## 2022-06-23 ENCOUNTER — Ambulatory Visit (INDEPENDENT_AMBULATORY_CARE_PROVIDER_SITE_OTHER): Payer: Medicare Other | Admitting: Dermatology

## 2022-06-23 DIAGNOSIS — C44612 Basal cell carcinoma of skin of right upper limb, including shoulder: Secondary | ICD-10-CM

## 2022-06-23 DIAGNOSIS — C44619 Basal cell carcinoma of skin of left upper limb, including shoulder: Secondary | ICD-10-CM | POA: Diagnosis not present

## 2022-06-23 NOTE — Progress Notes (Signed)
   Follow-Up Visit   Subjective  Jackson [name] is a 71 y.o. male who presents for the following: Procedure (Patient here today to treat bc proven BCC at right and left forearm. ).  Patient also with a spot at left cheek that has been sore.  The following portions of the chart were reviewed this encounter and updated as appropriate:   Tobacco  Allergies  Meds  Problems  Med Hx  Surg Hx  Fam Hx      Review of Systems:  No other skin or systemic complaints except as noted in HPI or Assessment and Plan.  Objective  Well appearing patient in no apparent distress; mood and affect are within normal limits.  A focused examination was performed including arms. Relevant physical exam findings are noted in the Assessment and Plan.  Right Forearm Healing bx site  Left Forearm Healing bx site    Assessment & Plan  Basal cell carcinoma (BCC) of skin of right upper extremity including shoulder Right Forearm  Destruction of lesion  Destruction method: electrodesiccation and curettage   Informed consent: discussed and consent obtained   Timeout:  patient name, date of birth, surgical site, and procedure verified Anesthesia: the lesion was anesthetized in a standard fashion   Anesthetic:  Topical anesthetic Curettage performed in three different directions: Yes   Electrodesiccation performed over the curetted area: Yes   Curettage cycles:  3 Final wound size (cm):  1 Hemostasis achieved with:  electrodesiccation Outcome: patient tolerated procedure well with no complications   Post-procedure details: sterile dressing applied and wound care instructions given   Dressing type: petrolatum    Basal cell carcinoma (BCC) of skin of left upper extremity including shoulder Left Forearm  Destruction of lesion  Destruction method: electrodesiccation and curettage   Informed consent: discussed and consent obtained   Timeout:  patient name, date of birth, surgical site, and procedure  verified Anesthesia: the lesion was anesthetized in a standard fashion   Anesthetic:  1% lidocaine w/ epinephrine 1-100,000 buffered w/ 8.4% NaHCO3 Curettage performed in three different directions: Yes   Electrodesiccation performed over the curetted area: Yes   Curettage cycles:  3 Final wound size (cm):  1.1 Hemostasis achieved with:  electrodesiccation Outcome: patient tolerated procedure well with no complications   Post-procedure details: sterile dressing applied and wound care instructions given   Dressing type: petrolatum     Return for TBSE, as scheduled.  Graciella Belton, RMA, am acting as scribe for Forest Gleason, MD .  Documentation: I have reviewed the above documentation for accuracy and completeness, and I agree with the above.  Forest Gleason, MD

## 2022-06-23 NOTE — Patient Instructions (Addendum)
Wound Care Instructions  Cleanse wound gently with soap and water once a day then pat dry with clean gauze. Apply a thin coat of Petrolatum (petroleum jelly, "Vaseline") over the wound (unless you have an allergy to this). We recommend that you use a new, sterile tube of Vaseline. Do not pick or remove scabs. Do not remove the yellow or white "healing tissue" from the base of the wound.  Cover the wound with fresh, clean, nonstick gauze and secure with paper tape. You may use Band-Aids in place of gauze and tape if the wound is small enough, but would recommend trimming much of the tape off as there is often too much. Sometimes Band-Aids can irritate the skin.  You should call the office for your biopsy report after 1 week if you have not already been contacted.  If you experience any problems, such as abnormal amounts of bleeding, swelling, significant bruising, significant pain, or evidence of infection, please call the office immediately.  FOR ADULT SURGERY PATIENTS: If you need something for pain relief you may take 1 extra strength Tylenol (acetaminophen) AND 2 Ibuprofen (200mg each) together every 4 hours as needed for pain. (do not take these if you are allergic to them or if you have a reason you should not take them.) Typically, you may only need pain medication for 1 to 3 days.     Due to recent changes in healthcare laws, you may see results of your pathology and/or laboratory studies on MyChart before the doctors have had a chance to review them. We understand that in some cases there may be results that are confusing or concerning to you. Please understand that not all results are received at the same time and often the doctors may need to interpret multiple results in order to provide you with the best plan of care or course of treatment. Therefore, we ask that you please give us 2 business days to thoroughly review all your results before contacting the office for clarification. Should  we see a critical lab result, you will be contacted sooner.   If You Need Anything After Your Visit  If you have any questions or concerns for your doctor, please call our main line at [phone] and press option 4 to reach your doctor's medical assistant. If no one answers, please leave a voicemail as directed and we will return your call as soon as possible. Messages left after 4 pm will be answered the following business day.   You may also send us a message via MyChart. We typically respond to MyChart messages within 1-2 business days.  For prescription refills, please ask your pharmacy to contact our office. Our fax number is [phone].  If you have an urgent issue when the clinic is closed that cannot wait until the next business day, you can page your doctor at the number below.    Please note that while we do our best to be available for urgent issues outside of office hours, we are not available 24/7.   If you have an urgent issue and are unable to reach us, you may choose to seek medical care at your doctor's office, retail clinic, urgent care center, or emergency room.  If you have a medical emergency, please immediately call 911 or go to the emergency department.  Pager Numbers  - Dr. Kowalski: [phone]  - Dr. Moye: [phone]  - Dr. Stewart: [phone]  In the event of inclement weather, please call our main line at   [phone] for an update on the status of any delays or closures.  Dermatology Medication Tips: Please keep the boxes that topical medications come in in order to help keep track of the instructions about where and how to use these. Pharmacies typically print the medication instructions only on the boxes and not directly on the medication tubes.   If your medication is too expensive, please contact our office at [phone] option 4 or send us a message through MyChart.   We are unable to tell what your co-pay for medications will be in  advance as this is different depending on your insurance coverage. However, we may be able to find a substitute medication at lower cost or fill out paperwork to get insurance to cover a needed medication.   If a prior authorization is required to get your medication covered by your insurance company, please allow us 1-2 business days to complete this process.  Drug prices often vary depending on where the prescription is filled and some pharmacies may offer cheaper prices.  The website www.goodrx.com contains coupons for medications through different pharmacies. The prices here do not account for what the cost may be with help from insurance (it may be cheaper with your insurance), but the website can give you the price if you did not use any insurance.  - You can print the associated coupon and take it with your prescription to the pharmacy.  - You may also stop by our office during regular business hours and pick up a GoodRx coupon card.  - If you need your prescription sent electronically to a different pharmacy, notify our office through [place] MyChart or by phone at [phone] option 4.     Si Usted Necesita Algo Despus de Su Visita  Tambin puede enviarnos un mensaje a travs de MyChart. Por lo general respondemos a los mensajes de MyChart en el transcurso de 1 a 2 das hbiles.  Para renovar recetas, por favor pida a su farmacia que se ponga en contacto con nuestra oficina. Nuestro nmero de fax es el [phone].  Si tiene un asunto urgente cuando la clnica est cerrada y que no puede esperar hasta el siguiente da hbil, puede llamar/localizar a su doctor(a) al nmero que aparece a continuacin.   Por favor, tenga en cuenta que aunque hacemos todo lo posible para estar disponibles para asuntos urgentes fuera del horario de oficina, no estamos disponibles las 24 horas del da, los 7 das de la semana.   Si tiene un problema urgente y no puede comunicarse con nosotros, puede  optar por buscar atencin mdica  en el consultorio de su doctor(a), en una clnica privada, en un centro de atencin urgente o en una sala de emergencias.  Si tiene una emergencia mdica, por favor llame inmediatamente al 911 o vaya a la sala de emergencias.  Nmeros de bper  - Dr. Kowalski: [phone]  - Dra. Moye: [phone]  - Dra. Stewart: [phone]  En caso de inclemencias del tiempo, por favor llame a nuestra lnea principal al [phone] para una actualizacin sobre el estado de cualquier retraso o cierre.  Consejos para la medicacin en dermatologa: Por favor, guarde las cajas en las que vienen los medicamentos de uso tpico para ayudarle a seguir las instrucciones sobre dnde y cmo usarlos. Las farmacias generalmente imprimen las instrucciones del medicamento slo en las cajas y no directamente en los tubos del medicamento.   Si su medicamento es muy caro, por favor, pngase en contacto con   nuestra oficina llamando al [phone] y presione la opcin 4 o envenos un mensaje a travs de MyChart.   No podemos decirle cul ser su copago por los medicamentos por adelantado ya que esto es diferente dependiendo de la cobertura de su seguro. Sin embargo, es posible que podamos encontrar un medicamento sustituto a menor costo o llenar un formulario para que el seguro cubra el medicamento que se considera necesario.   Si se requiere una autorizacin previa para que su compaa de seguros cubra su medicamento, por favor permtanos de 1 a 2 das hbiles para completar este proceso.  Los precios de los medicamentos varan con frecuencia dependiendo del lugar de dnde se surte la receta y alguna farmacias pueden ofrecer precios ms baratos.  El sitio web www.goodrx.com tiene cupones para medicamentos de diferentes farmacias. Los precios aqu no tienen en cuenta lo que podra costar con la ayuda del seguro (puede ser ms barato con su seguro), pero el sitio web puede darle el  precio si no utiliz ningn seguro.  - Puede imprimir el cupn correspondiente y llevarlo con su receta a la farmacia.  - Tambin puede pasar por nuestra oficina durante el horario de atencin regular y recoger una tarjeta de cupones de GoodRx.  - Si necesita que su receta se enve electrnicamente a una farmacia diferente, informe a nuestra oficina a travs de MyChart de [place] o por telfono llamando al [phone] y presione la opcin 4.  

## 2022-06-25 DIAGNOSIS — E042 Nontoxic multinodular goiter: Secondary | ICD-10-CM | POA: Diagnosis not present

## 2022-08-10 ENCOUNTER — Encounter: Payer: Self-pay | Admitting: Family Medicine

## 2022-08-10 ENCOUNTER — Ambulatory Visit (INDEPENDENT_AMBULATORY_CARE_PROVIDER_SITE_OTHER): Payer: Medicare Other | Admitting: Family Medicine

## 2022-08-10 VITALS — BP 120/76 | HR 54 | Temp 97.7°F | Ht 73.0 in | Wt 244.2 lb

## 2022-08-10 DIAGNOSIS — E1159 Type 2 diabetes mellitus with other circulatory complications: Secondary | ICD-10-CM

## 2022-08-10 DIAGNOSIS — E119 Type 2 diabetes mellitus without complications: Secondary | ICD-10-CM

## 2022-08-10 DIAGNOSIS — I152 Hypertension secondary to endocrine disorders: Secondary | ICD-10-CM | POA: Diagnosis not present

## 2022-08-10 DIAGNOSIS — E041 Nontoxic single thyroid nodule: Secondary | ICD-10-CM

## 2022-08-10 DIAGNOSIS — J309 Allergic rhinitis, unspecified: Secondary | ICD-10-CM

## 2022-08-10 NOTE — Progress Notes (Signed)
    SUBJECTIVE:   CHIEF COMPLAINT / HPI:  transfer care  Patient presents to clinic to transfer care.  No acute concerns today  HTN Asymptomatic.  Tolerating current medication. Takes Metoprolol for history of SVT.   DM Type 2 Asymptomatic.  Tolerating medications.  Taking Metformin 9485 mg and Trulicity 4.62 mg. Freestyle monitor glucose range 150's. On statin.  Not on ACE/ARB  H/O SVT Controlled with Metoprolol XL 50 mg daily. Follows with Cardiology   PERTINENT  PMH / PSH:  HTN DM Type 2 HLD H/O SVT    OBJECTIVE:   BP 124/80 (BP Location: Left Arm, Patient Position: Sitting, Cuff Size: Normal)   Pulse (!) 54   Temp 97.7 F (36.5 C) (Oral)   Ht '6\' 1"'$  (1.854 m)   Wt 244 lb 3.2 oz (110.8 kg)   SpO2 99%   BMI 32.22 kg/m    General: Alert, no acute distress Cardio: Normal S1 and S2, RRR, no r/m/g Pulm: CTAB, normal work of breathing Abdomen: Bowel sounds normal. Abdomen soft and non-tender.  Extremities: No peripheral edema.  Neuro: Cranial nerves grossly intact   ASSESSMENT/PLAN:   Type 2 diabetes mellitus without complication, without long-term current use of insulin (HCC) Asymptomatic.  Reviewed recent labs,  A1c 6.5. Goal 7.5 for age.  Compliant with current medications -Continue Metformin 1000 mg daily -Continue Trulicity 0.'75mg'$  weekly -Recommend yearly foot exam at next visit. Can refer to Podiatry if wanting. -Eye exam UTD. Due 023/2024 -Statin intolerant due to myopathy.  Had normal calcium score, low risk for coronary event. -Not on ACE/ARB, consider in future -Follow up in 1 month  Hypertension associated with diabetes ([place]) Asymptomatic.  Not at goal <120/80.  Currently on Metoprolol for history of SVT.  Not on ACE/ARB therapy -Monitor BP at home, record results and will review at next visit -Follow up in 1 month, if BP remains elevated plan to discuss initiation of ARB   Thyroid nodule Multigoiter thyroid. Currently asymptomatic.  FNA in 2019  on 2 nodules that were benign.  Follows with Endocrinology. Last seen 05/2022. -Continue to follow up with Endo as scheduled    HCM Recommend RSV, PCV 20 and Flu vaccine Colonoscopy UTD Recommend Foot exam at next visit   PDMP Reviewed  Carollee Leitz, MD

## 2022-08-10 NOTE — Patient Instructions (Addendum)
It was a pleasure meeting you today. Thank you for allowing me to take part in your health care.  Our goals for today as we discussed include:  For your blood pressure Your blood pressure is not quite at goal.  Would like it to be less than 120/80. Monitor your blood pressure at home 2-3 time a week and if continues to remain greater than 120/80 will plan to discuss at next visit.  Please record reading for review.   For your diabetes Your last A1c was 6.5.  This is good. Continue Trulicity 2.53 mg once a week Continue Metformin 1000 mg daily  Recommend foot exam.  Can refer to Podiatry if you are interested  Recommend RSV vaccine  Recommend Shingles vaccine.  This is a 2 dose series and can be given at your local pharmacy.  Please talk to your pharmacist about this.   Recommend Flu vaccine   Please follow-up with PCP in 1 month   If you have any questions or concerns, please do not hesitate to call the office at [phone].  I look forward to our next visit and until then take care and stay safe.  Regards,   Carollee Leitz, MD   [facility]

## 2022-08-15 ENCOUNTER — Encounter: Payer: Self-pay | Admitting: Family Medicine

## 2022-08-15 MED ORDER — FEXOFENADINE HCL 60 MG PO TABS: 60.0000 mg | ORAL_TABLET | Freq: Two times a day (BID) | ORAL | 3 refills | Status: DC

## 2022-08-15 NOTE — Assessment & Plan Note (Signed)
Asymptomatic.  Not at goal <120/80.  Currently on Metoprolol for history of SVT.  Not on ACE/ARB therapy -Monitor BP at home, record results and will review at next visit -Follow up in 1 month, if BP remains elevated plan to discuss initiation of ARB

## 2022-08-15 NOTE — Assessment & Plan Note (Signed)
Asymptomatic.  Reviewed recent labs,  A1c 6.5. Goal 7.5 for age.  Compliant with current medications -Continue Metformin 1000 mg daily -Continue Trulicity 0.'75mg'$  weekly -Recommend yearly foot exam at next visit. Can refer to Podiatry if wanting. -Eye exam UTD. Due 023/2024 -Statin intolerant due to myopathy.  Had normal calcium score, low risk for coronary event. -Not on ACE/ARB, consider in future -Follow up in 1 month

## 2022-08-15 NOTE — Assessment & Plan Note (Signed)
Multigoiter thyroid. Currently asymptomatic.  FNA in 2019 on 2 nodules that were benign.  Follows with Endocrinology. Last seen 05/2022. -Continue to follow up with Endo as scheduled

## 2022-08-17 ENCOUNTER — Encounter: Payer: Self-pay | Admitting: Family Medicine

## 2022-09-01 ENCOUNTER — Ambulatory Visit
Admission: RE | Admit: 2022-09-01 | Discharge: 2022-09-01 | Disposition: A | Discharge status: Home / Self Care | Payer: Medicare Other | Source: Ambulatory Visit | Attending: Urology | Admitting: Urology

## 2022-09-01 DIAGNOSIS — N400 Enlarged prostate without lower urinary tract symptoms: Secondary | ICD-10-CM | POA: Diagnosis not present

## 2022-09-01 DIAGNOSIS — N1330 Unspecified hydronephrosis: Secondary | ICD-10-CM | POA: Diagnosis not present

## 2022-09-01 DIAGNOSIS — N281 Cyst of kidney, acquired: Secondary | ICD-10-CM | POA: Diagnosis not present

## 2022-09-01 DIAGNOSIS — R339 Retention of urine, unspecified: Secondary | ICD-10-CM | POA: Diagnosis not present

## 2022-09-09 ENCOUNTER — Ambulatory Visit (INDEPENDENT_AMBULATORY_CARE_PROVIDER_SITE_OTHER): Payer: Medicare Other | Admitting: Family Medicine

## 2022-09-09 ENCOUNTER — Encounter: Payer: Self-pay | Admitting: Family Medicine

## 2022-09-09 VITALS — BP 126/84 | HR 68 | Temp 97.5°F | Ht 73.0 in | Wt 241.2 lb

## 2022-09-09 DIAGNOSIS — D509 Iron deficiency anemia, unspecified: Secondary | ICD-10-CM

## 2022-09-09 DIAGNOSIS — D490 Neoplasm of unspecified behavior of digestive system: Secondary | ICD-10-CM | POA: Diagnosis not present

## 2022-09-09 DIAGNOSIS — E785 Hyperlipidemia, unspecified: Secondary | ICD-10-CM | POA: Diagnosis not present

## 2022-09-09 DIAGNOSIS — E559 Vitamin D deficiency, unspecified: Secondary | ICD-10-CM

## 2022-09-09 DIAGNOSIS — E041 Nontoxic single thyroid nodule: Secondary | ICD-10-CM

## 2022-09-09 DIAGNOSIS — I152 Hypertension secondary to endocrine disorders: Secondary | ICD-10-CM | POA: Diagnosis not present

## 2022-09-09 DIAGNOSIS — E119 Type 2 diabetes mellitus without complications: Secondary | ICD-10-CM

## 2022-09-09 DIAGNOSIS — R7989 Other specified abnormal findings of blood chemistry: Secondary | ICD-10-CM

## 2022-09-09 DIAGNOSIS — E1159 Type 2 diabetes mellitus with other circulatory complications: Secondary | ICD-10-CM

## 2022-09-09 NOTE — Progress Notes (Signed)
SUBJECTIVE:   Chief Complaint  Patient presents with   Follow-up    1 month follow up-BP   HPI Presents for follow up for elevated blood pressure. Seen in clinic on 10/16 no changes made in medications at that time.  Blood pressures at home remain stable and less than 120/80.  Since then patient reports no associated symptoms of headaches, visual disturbances, chest pain, shortness of breath or lower extremity edema.  No acute concerns today.  PERTINENT PMH / PSH: Diabetes type 2 Hypertension Hyperlipidemia Thyroid nodule Intraductal papillary mucinous neoplasm  OBJECTIVE:  BP 126/84 (BP Location: Left Arm, Patient Position: Sitting, Cuff Size: Large)   Pulse 68   Temp (!) 97.5 F (36.4 C) (Oral)   Ht '6\' 1"'$  (1.854 m)   Wt 241 lb 3.2 oz (109.4 kg)   SpO2 98%   BMI 31.82 kg/m    Physical Exam Vitals reviewed.  Constitutional:      General: He is not in acute distress.    Appearance: Normal appearance. He is normal weight. He is not ill-appearing, toxic-appearing or diaphoretic.  Eyes:     General:        Right eye: No discharge.        Left eye: No discharge.  Cardiovascular:     Rate and Rhythm: Normal rate and regular rhythm.     Heart sounds: Normal heart sounds.  Pulmonary:     Effort: Pulmonary effort is normal.     Breath sounds: Normal breath sounds.  Abdominal:     General: Bowel sounds are normal.  Musculoskeletal:        General: Normal range of motion.     Cervical back: Normal range of motion.  Skin:    General: Skin is warm and dry.  Neurological:     Mental Status: He is alert and oriented to person, place, and time. Mental status is at baseline.  Psychiatric:        Mood and Affect: Mood normal.        Behavior: Behavior normal.        Thought Content: Thought content normal.        Judgment: Judgment normal.     ASSESSMENT/PLAN:  Hypertension associated with diabetes ([place]) Assessment & Plan: Initially elevated.  Repeat decreased to  126/84.  Patient reports home blood pressures 120/78.  Per JNC 8 guidelines at goal of <140/90. Currently on metoprolol 50 mg daily for previous history of SVT that remained stable Repeat labs in 1 year  Orders: -     Comprehensive metabolic panel; Future  Type 2 diabetes mellitus without complication, without long-term current use of insulin (HCC) Assessment & Plan: Stable A1c since 2017 Decrease A1c checks to yearly Continue metformin 1000 mg daily Continue Trulicity 7.32 mg weekly  Orders: -     Hemoglobin A1c; Future  Hyperlipidemia, unspecified hyperlipidemia type Assessment & Plan: Recent calcium score 0 Cannot tolerate statin secondary to myalgia Continue lifestyle management  Orders: -     Lipid panel; Future  Iron deficiency anemia, unspecified iron deficiency anemia type -     CBC with Differential/Platelet; Future -     Iron, TIBC and Ferritin Panel; Future  Low vitamin B12 level -     Vitamin B12; Future  Vitamin D deficiency -     VITAMIN D 25 Hydroxy (Vit-D Deficiency, Fractures); Future  IPMN (intraductal papillary mucinous neoplasm) Assessment & Plan: Previous MRI abdomen from 04/30/2021 noted subcentimeter cystic lesions in the pancreatic  head and body.  Likely represent a focal areas of ectasia or small pancreatic pseudocyst but cannot exclude IPMN.  Recommended follow-up MRI/MRCP or pancreatic protocol CT in 2 years. Follow-up with imaging in 2024    PDMP reviewed  Return in about 1 year (around 09/10/2023).  Carollee Leitz, MD

## 2022-09-09 NOTE — Patient Instructions (Addendum)
It was a pleasure meeting you today. Thank you for allowing me to take part in your health care.  Our goals for today as we discussed include:  For your blood pressure Repeat blood pressure 126/84 Continue metoprolol 50 mg daily Monitor blood pressure at home.  Please schedule appointment for lab work at least 1 week before your next visit  Please follow-up with PCP in 1 year for annual physical or sooner if needed  If you have any questions or concerns, please do not hesitate to call the office at [phone].  I look forward to our next visit and until then take care and stay safe.  Regards,   Carollee Leitz, MD   [facility]

## 2022-09-16 ENCOUNTER — Ambulatory Visit: Payer: Medicare Other | Admitting: Urology

## 2022-09-26 ENCOUNTER — Encounter: Payer: Self-pay | Admitting: Family Medicine

## 2022-09-26 DIAGNOSIS — R7989 Other specified abnormal findings of blood chemistry: Secondary | ICD-10-CM | POA: Insufficient documentation

## 2022-09-26 DIAGNOSIS — E559 Vitamin D deficiency, unspecified: Secondary | ICD-10-CM | POA: Insufficient documentation

## 2022-09-26 NOTE — Assessment & Plan Note (Signed)
Stable A1c since 2017 Decrease A1c checks to yearly Continue metformin 1000 mg daily Continue Trulicity 2.35 mg weekly

## 2022-09-26 NOTE — Assessment & Plan Note (Addendum)
Initially elevated.  Repeat decreased to 126/84.  Patient reports home blood pressures 120/78.  Per JNC 8 guidelines at goal of <140/90. Currently on metoprolol 50 mg daily for previous history of SVT that remained stable Repeat labs in 1 year

## 2022-09-26 NOTE — Assessment & Plan Note (Addendum)
Previous MRI abdomen from 04/30/2021 noted subcentimeter cystic lesions in the pancreatic head and body.  Likely represent a focal areas of ectasia or small pancreatic pseudocyst but cannot exclude IPMN.  Recommended follow-up MRI/MRCP or pancreatic protocol CT in 2 years. Follow-up with imaging in 2024

## 2022-09-26 NOTE — Assessment & Plan Note (Signed)
Recent calcium score 0 Cannot tolerate statin secondary to myalgia Continue lifestyle management

## 2022-09-30 ENCOUNTER — Encounter: Payer: Self-pay | Admitting: Urology

## 2022-09-30 ENCOUNTER — Ambulatory Visit (INDEPENDENT_AMBULATORY_CARE_PROVIDER_SITE_OTHER): Payer: Medicare Other | Admitting: Urology

## 2022-09-30 VITALS — BP 163/82 | HR 68 | Ht 73.0 in | Wt 238.0 lb

## 2022-09-30 DIAGNOSIS — R3914 Feeling of incomplete bladder emptying: Secondary | ICD-10-CM | POA: Diagnosis not present

## 2022-09-30 DIAGNOSIS — R972 Elevated prostate specific antigen [PSA]: Secondary | ICD-10-CM | POA: Diagnosis not present

## 2022-09-30 DIAGNOSIS — R3912 Poor urinary stream: Secondary | ICD-10-CM | POA: Diagnosis not present

## 2022-09-30 DIAGNOSIS — Z125 Encounter for screening for malignant neoplasm of prostate: Secondary | ICD-10-CM

## 2022-09-30 DIAGNOSIS — N400 Enlarged prostate without lower urinary tract symptoms: Secondary | ICD-10-CM

## 2022-09-30 LAB — BLADDER SCAN AMB NON-IMAGING

## 2022-09-30 MED ORDER — TAMSULOSIN HCL 0.4 MG PO CAPS: 0.4000 mg | ORAL_CAPSULE | Freq: Every day | ORAL | 3 refills | Status: DC

## 2022-09-30 MED ORDER — FINASTERIDE 5 MG PO TABS: 5.0000 mg | ORAL_TABLET | Freq: Every day | ORAL | 3 refills | Status: DC

## 2022-09-30 NOTE — Progress Notes (Signed)
   09/30/2022 9:46 AM   Jackson [name] 1950-11-06 [id number]  Reason for visit: Follow up elevated PSA, BPH, incomplete bladder emptying  HPI: 71 year old male with a long history of mildly elevated PSA ranging from 3 - 4.9 over the last few years, and has been stable with a reassuring greater than 20% free, and DRE has been benign.  He has had worsening urinary symptoms with feeling of incomplete emptying and weak stream, and PVR has been elevated in the past greater than 250 mL.  He was started on Flomax with some improvement in the urinary symptoms, but PVRs improved slightly to 116m.  I recommended seriously considering an outlet procedure with his persistently elevated PVRs but he deferred.  Using shared decision making he was amenable to a yearly renal ultrasound and PVR to rule out upstream hydronephrosis and renal damage.  I personally viewed and interpreted the most recent ultrasound dated 09/01/2022 that shows no hydronephrosis, enlarged prostate measuring 80 g, and postvoid bladder residual of 3645m  Renal function remains normal with creatinine from July 2023 of 1.19, EGFR greater than 60.  PSA normal at 3.49 from July 2023 and stable from prior, PSA density very reassuring, borderline PSA likely secondary to BPH.  We reviewed the AUA guidelines that do not recommend routine PSA screening and 71.  He actually ran out of Flomax about a week ago and has noticed significant worsening in his urinary symptoms since that time with nocturia up to 6 times per night and some urgency.  PVR today in clinic elevated at 3387m We again discussed options for his suspected BPH with incomplete bladder emptying including continuing Flomax alone, addition of finasteride for maximal medical therapy, or consideration of an outlet procedure, most likely HOLEP based on his 80 g gland and incomplete emptying.  Risk discussed extensively including urinary retention, long-term bladder damage,  hydronephrosis, renal failure, UTIs.  I think he has a good understanding of these risks, but is not willing to accept the risks of surgery including bleeding/infection/incontinence, but was amenable to adding finasteride for maximal medical therapy.  Return precautions were discussed extensively.  Flomax refilled, added finasteride RTC 1 year PVR, renal/bladder ultrasound prior   BriBilley CoD  [address]antonC [id number]

## 2022-09-30 NOTE — Patient Instructions (Signed)

## 2022-10-08 ENCOUNTER — Encounter: Payer: Medicare Other | Admitting: Dermatology

## 2022-10-22 ENCOUNTER — Encounter: Payer: Self-pay | Admitting: Family Medicine

## 2022-11-17 ENCOUNTER — Encounter: Payer: Self-pay | Admitting: Dermatology

## 2022-11-17 ENCOUNTER — Ambulatory Visit (INDEPENDENT_AMBULATORY_CARE_PROVIDER_SITE_OTHER): Payer: Medicare Other | Admitting: Dermatology

## 2022-11-17 DIAGNOSIS — Z85828 Personal history of other malignant neoplasm of skin: Secondary | ICD-10-CM

## 2022-11-17 DIAGNOSIS — C44319 Basal cell carcinoma of skin of other parts of face: Secondary | ICD-10-CM

## 2022-11-17 DIAGNOSIS — D229 Melanocytic nevi, unspecified: Secondary | ICD-10-CM | POA: Diagnosis not present

## 2022-11-17 DIAGNOSIS — L814 Other melanin hyperpigmentation: Secondary | ICD-10-CM | POA: Diagnosis not present

## 2022-11-17 DIAGNOSIS — L578 Other skin changes due to chronic exposure to nonionizing radiation: Secondary | ICD-10-CM | POA: Diagnosis not present

## 2022-11-17 DIAGNOSIS — Z1283 Encounter for screening for malignant neoplasm of skin: Secondary | ICD-10-CM | POA: Diagnosis not present

## 2022-11-17 DIAGNOSIS — Z85820 Personal history of malignant melanoma of skin: Secondary | ICD-10-CM

## 2022-11-17 DIAGNOSIS — D492 Neoplasm of unspecified behavior of bone, soft tissue, and skin: Secondary | ICD-10-CM

## 2022-11-17 DIAGNOSIS — L820 Inflamed seborrheic keratosis: Secondary | ICD-10-CM | POA: Diagnosis not present

## 2022-11-17 DIAGNOSIS — L570 Actinic keratosis: Secondary | ICD-10-CM | POA: Diagnosis not present

## 2022-11-17 DIAGNOSIS — C4491 Basal cell carcinoma of skin, unspecified: Secondary | ICD-10-CM

## 2022-11-17 DIAGNOSIS — L821 Other seborrheic keratosis: Secondary | ICD-10-CM

## 2022-11-17 HISTORY — DX: Basal cell carcinoma of skin, unspecified: C44.91

## 2022-11-17 NOTE — Patient Instructions (Addendum)
If ok with Dr. Volanda Napoleon, recommend Niacinamide or Nicotinamide '500mg'$  twice per day to lower risk of non-melanoma skin cancer by approximately 25%. This is usually available at [employer]. There is a possibility it could affect blood sugar so if Dr. Volanda Napoleon is ok with you trying it, recommend monitoring closely when first starting the supplement.    Wound Care Instructions  Cleanse wound gently with soap and water once a day then pat dry with clean gauze. Apply a thin coat of Petrolatum (petroleum jelly, "Vaseline") over the wound (unless you have an allergy to this). We recommend that you use a new, sterile tube of Vaseline. Do not pick or remove scabs. Do not remove the yellow or white "healing tissue" from the base of the wound.  Cover the wound with fresh, clean, nonstick gauze and secure with paper tape. You may use Band-Aids in place of gauze and tape if the wound is small enough, but would recommend trimming much of the tape off as there is often too much. Sometimes Band-Aids can irritate the skin.  You should call the office for your biopsy report after 1 week if you have not already been contacted.  If you experience any problems, such as abnormal amounts of bleeding, swelling, significant bruising, significant pain, or evidence of infection, please call the office immediately.  FOR ADULT SURGERY PATIENTS: If you need something for pain relief you may take 1 extra strength Tylenol (acetaminophen) AND 2 Ibuprofen ('200mg'$  each) together every 4 hours as needed for pain. (do not take these if you are allergic to them or if you have a reason you should not take them.) Typically, you may only need pain medication for 1 to 3 days.   Recommend Niacinamide or Nicotinamide '500mg'$  twice per day to lower risk of non-melanoma skin cancer by approximately 25%. This is usually available at [employer].  Recommend taking Heliocare sun protection supplement daily in sunny weather for additional sun  protection. For maximum protection on the sunniest days, you can take up to 2 capsules of regular Heliocare OR take 1 capsule of Heliocare Ultra. For prolonged exposure (such as a full day in the sun), you can repeat your dose of the supplement 4 hours after your first dose. Heliocare can be purchased at [employer], at some [facility] or at [email or web address].    Melanoma ABCDEs  Melanoma is the most dangerous type of skin cancer, and is the leading cause of death from skin disease.  You are more likely to develop melanoma if you: Have light-colored skin, light-colored eyes, or red or blond hair Spend a lot of time in the sun Tan regularly, either outdoors or in a tanning bed Have had blistering sunburns, especially during childhood Have a close family member who has had a melanoma Have atypical moles or large birthmarks  Early detection of melanoma is key since treatment is typically straightforward and cure rates are extremely high if we catch it early.   The first sign of melanoma is often a change in a mole or a new dark spot.  The ABCDE system is a way of remembering the signs of melanoma.  A for asymmetry:  The two halves do not match. B for border:  The edges of the growth are irregular. C for color:  A mixture of colors are present instead of an even brown color. D for diameter:  Melanomas are usually (but not always) greater than 32m - the size of a pencil eraser. E for  evolution:  The spot keeps changing in size, shape, and color.  Please check your skin once per month between visits. You can use a small mirror in front and a large mirror behind you to keep an eye on the back side or your body.   If you see any new or changing lesions before your next follow-up, please call to schedule a visit.  Please continue daily skin protection including broad spectrum sunscreen SPF 30+ to sun-exposed areas, reapplying every 2 hours as needed when you're outdoors.    Due to recent  changes in healthcare laws, you may see results of your pathology and/or laboratory studies on MyChart before the doctors have had a chance to review them. We understand that in some cases there may be results that are confusing or concerning to you. Please understand that not all results are received at the same time and often the doctors may need to interpret multiple results in order to provide you with the best plan of care or course of treatment. Therefore, we ask that you please give [place] 2 business days to thoroughly review all your results before contacting the office for clarification. Should we see a critical lab result, you will be contacted sooner.   If You Need Anything After Your Visit  If you have any questions or concerns for your doctor, please call our main line at [phone] and press option 4 to reach your doctor's medical assistant. If no one answers, please leave a voicemail as directed and we will return your call as soon as possible. Messages left after 4 pm will be answered the following business day.   You may also send [place] a message via [place]. We typically respond to MyChart messages within 1-2 business days.  For prescription refills, please ask your pharmacy to contact our office. Our fax number is [phone].  If you have an urgent issue when the clinic is closed that cannot wait until the next business day, you can page your doctor at the number below.    Please note that while we do our best to be available for urgent issues outside of office hours, we are not available 24/7.   If you have an urgent issue and are unable to reach [place], you may choose to seek medical care at your doctor's office, retail clinic, urgent care center, or emergency room.  If you have a medical emergency, please immediately call 911 or go to the emergency department.  Pager Numbers  - Dr. Nehemiah Massed: [phone]  - Dr. Laurence Ferrari: [phone]  - Dr. Nicole Kindred: [phone]  In the event of  inclement weather, please call our main line at [phone] for an update on the status of any delays or closures.  Dermatology Medication Tips: Please keep the boxes that topical medications come in in order to help keep track of the instructions about where and how to use these. Pharmacies typically print the medication instructions only on the boxes and not directly on the medication tubes.   If your medication is too expensive, please contact our office at [phone] option 4 or send [place] a message through [place].   We are unable to tell what your co-pay for medications will be in advance as this is different depending on your insurance coverage. However, we may be able to find a substitute medication at lower cost or fill out paperwork to get insurance to cover a needed medication.   If a prior authorization is required to get your medication covered  by your insurance company, please allow [place] 1-2 business days to complete this process.  Drug prices often vary depending on where the prescription is filled and some pharmacies may offer cheaper prices.  The website www.goodrx.com contains coupons for medications through different pharmacies. The prices here do not account for what the cost may be with help from insurance (it may be cheaper with your insurance), but the website can give you the price if you did not use any insurance.  - You can print the associated coupon and take it with your prescription to the pharmacy.  - You may also stop by our office during regular business hours and pick up a GoodRx coupon card.  - If you need your prescription sent electronically to a different pharmacy, notify our office through [facility] or by phone at [phone] option 4.     Si Usted Necesita Algo Despus de Su Visita  Tambin puede enviarnos un mensaje a travs de Pharmacist, community. Por lo general respondemos a los mensajes de MyChart en el transcurso de 1 a 2 das hbiles.  Para renovar  recetas, por favor pida a su farmacia que se ponga en contacto con nuestra oficina. Harland Dingwall de fax es [place] [phone].  Si tiene un asunto urgente cuando la clnica est cerrada y que no puede esperar hasta el siguiente da hbil, puede llamar/localizar a su doctor(a) al nmero que aparece a continuacin.   Por favor, tenga en cuenta que aunque hacemos todo lo posible para estar disponibles para asuntos urgentes fuera del horario de [place], no estamos disponibles las 24 horas del da, los 7 das de la [place].   Si tiene un problema urgente y no puede comunicarse con nosotros, puede optar por buscar atencin mdica  en el consultorio de su doctor(a), en una clnica privada, en un centro de atencin urgente o en una sala de emergencias.  Si tiene Engineering geologist, por favor llame inmediatamente al 911 o vaya a la sala de emergencias.  Nmeros de bper  - Dr. Nehemiah Massed: [phone]  - Dra. Joydan Gretzinger: [phone]  - Dra. Nicole Kindred: [phone]  En caso de inclemencias del [place], por favor llame a Johnsie Kindred principal al [phone] para una actualizacin sobre el [place] de cualquier retraso o cierre.  Consejos para la medicacin en dermatologa: Por favor, guarde las cajas en las que vienen los medicamentos de uso tpico para ayudarle a seguir las instrucciones sobre dnde y cmo usarlos. Las farmacias generalmente imprimen las instrucciones del medicamento slo en las cajas y no directamente en los tubos del [place].   Si su medicamento es muy caro, por favor, pngase en contacto con Zigmund Daniel llamando al [phone] y presione la opcin 4 o envenos un mensaje a travs de Pharmacist, community.   No podemos decirle cul ser su copago por los medicamentos por adelantado ya que esto es diferente dependiendo de la cobertura de su seguro. Sin embargo, es posible que podamos encontrar un medicamento sustituto a Electrical engineer un formulario para que el seguro cubra el medicamento  que se considera necesario.   Si se requiere una autorizacin previa para que su compaa de seguros [place] su medicamento, por favor permtanos de 1 a 2 das hbiles para completar este proceso.  Los precios de los medicamentos varan con frecuencia dependiendo del Environmental consultant de dnde se surte la receta y alguna farmacias pueden ofrecer precios ms baratos.  El sitio web www.goodrx.com tiene cupones para medicamentos de Airline pilot. Los precios aqu no  en cuenta lo que podra costar con la ayuda del seguro (puede ser ms barato con su seguro), pero el sitio web puede darle el precio si no utiliz ningn seguro.  - Puede imprimir el cupn correspondiente y llevarlo con su receta a la farmacia.  - Tambin puede pasar por nuestra oficina durante el horario de atencin regular y recoger una tarjeta de cupones de GoodRx.  - Si necesita que su receta se enve electrnicamente a una farmacia diferente, informe a nuestra oficina a travs de MyChart de [place] o por telfono llamando al [phone] y presione la opcin 4.  

## 2022-11-17 NOTE — Progress Notes (Signed)
Follow-Up Visit   Subjective  Jackson [name] is a 72 y.o. male who presents for the following: FBSE (Hx AK's, BCC, melanoma. ).  The patient presents for Total-Body Skin Exam (TBSE) for skin cancer screening and mole check.  The patient has spots, moles and lesions to be evaluated, some may be new or changing and the patient has concerns that these could be cancer.  The following portions of the chart were reviewed this encounter and updated as appropriate:   Tobacco  Allergies  Meds  Problems  Med Hx  Surg Hx  Fam Hx      Review of Systems:  No other skin or systemic complaints except as noted in HPI or Assessment and Plan.  Objective  Well appearing patient in no apparent distress; mood and affect are within normal limits.  A full examination was performed including scalp, head, eyes, ears, nose, lips, neck, chest, axillae, abdomen, back, buttocks, bilateral upper extremities, bilateral lower extremities, hands, feet, fingers, toes, fingernails, and toenails. All findings within normal limits unless otherwise noted below.  Left Forearm x 1, R sup shoulder x 1, R crua x 1,  L antihelix x 1 (4) Erythematous thin papules/macules with gritty scale.   Right Thigh x 2 (2) Erythematous stuck-on, waxy papule or plaque  Left Forehead 0.9 cm thin pink papule     Left Mid Cheek       Assessment & Plan  AK (actinic keratosis) (4) Left Forearm x 1, R sup shoulder x 1, R crua x 1,  L antihelix x 1  Actinic keratoses are precancerous spots that appear secondary to cumulative UV radiation exposure/sun exposure over time. They are chronic with expected duration over 1 year. A portion of actinic keratoses will progress to squamous cell carcinoma of the skin. It is not possible to reliably predict which spots will progress to skin cancer and so treatment is recommended to prevent development of skin cancer.  Recommend daily broad spectrum sunscreen SPF 30+ to sun-exposed areas,  reapply every 2 hours as needed.  Recommend staying in the shade or wearing long sleeves, sun glasses (UVA+UVB protection) and wide brim hats (4-inch brim around the entire circumference of the hat). Call for new or changing lesions.  Prior to procedure, discussed risks of blister formation, small wound, skin dyspigmentation, or rare scar following cryotherapy. Recommend Vaseline ointment to treated areas while healing.  Destruction of lesion - Left Forearm x 1, R sup shoulder x 1, R crua x 1,  L antihelix x 1  Destruction method: cryotherapy   Informed consent: discussed and consent obtained   Lesion destroyed using liquid nitrogen: Yes   Cryotherapy cycles:  2 Outcome: patient tolerated procedure well with no complications   Post-procedure details: wound care instructions given    Inflamed seborrheic keratosis (2) Right Thigh x 2  Symptomatic, irritating, patient would like treated.  Prior to procedure, discussed risks of blister formation, small wound, skin dyspigmentation, or rare scar following cryotherapy. Recommend Vaseline ointment to treated areas while healing.  Benign-appearing.  Observation.  Call clinic for new or changing lesions.     Destruction of lesion - Right Thigh x 2  Destruction method: cryotherapy   Informed consent: discussed and consent obtained   Lesion destroyed using liquid nitrogen: Yes   Cryotherapy cycles:  2 Outcome: patient tolerated procedure well with no complications   Post-procedure details: wound care instructions given    Neoplasm of skin (2) Left Forehead  Skin / nail  biopsy Type of biopsy: tangential   Informed consent: discussed and consent obtained   Timeout: patient name, date of birth, surgical site, and procedure verified   Procedure prep:  Patient was prepped and draped in usual sterile fashion Prep type:  Isopropyl alcohol Anesthesia: the lesion was anesthetized in a standard fashion   Anesthetic:  1% lidocaine w/  epinephrine 1-100,000 buffered w/ 8.4% NaHCO3 Instrument used: flexible razor blade   Hemostasis achieved with: aluminum chloride   Outcome: patient tolerated procedure well   Post-procedure details: wound care instructions given   Additional details:  Petrolatum and a pressure bandage applied  Specimen 1 - Surgical pathology Differential Diagnosis: r/o BCC  Check Margins: No 0.9 cm thin pink papule  Left Mid Cheek  DDX AK > BCC at left mid cheek. Discussed biopsy versus trial of treatment of AK. Patient prefers to treat with Efudex. Recheck on follow up. Pt has 5FU at home and is familiar with expected reaction and treatment instructions.  If + for [facility] at left forehead, will refer [facility].    History of Basal Cell Carcinoma of the Skin - No evidence of recurrence today - Recommend regular full body skin exams - Recommend daily broad spectrum sunscreen SPF 30+ to sun-exposed areas, reapply every 2 hours as needed.  - Call if any new or changing lesions are noted between office visits - If ok with Dr. Volanda Napoleon, recommend Niacinamide or Nicotinamide '500mg'$  twice per day to lower risk of non-melanoma skin cancer by approximately 25%. This is usually available at [employer]. There is a possibility it could affect blood sugar so if Dr. Volanda Napoleon is ok with you trying it, recommend monitoring closely when first starting the supplement.    History of Melanoma - No evidence of recurrence today at Left forearm. Superficial spreading. Tumor thickness 0.45m, anatomic level III. Excised: 02/22/2019, margins free.  - No lymphadenopathy - Recommend regular full body skin exams - Recommend daily broad spectrum sunscreen SPF 30+ to sun-exposed areas, reapply every 2 hours as needed.  - Call if any new or changing lesions are noted between office visits  Lentigines - Scattered tan macules - Due to sun exposure - Benign-appearing, observe - Recommend daily broad spectrum  sunscreen SPF 30+ to sun-exposed areas, reapply every 2 hours as needed. - Call for any changes  Seborrheic Keratoses - Stuck-on, waxy, tan-brown papules and/or plaques  - Benign-appearing - Discussed benign etiology and prognosis. - Observe - Call for any changes  Melanocytic Nevi - Tan-brown and/or pink-flesh-colored symmetric macules and papules - Benign appearing on exam today - Observation - Call clinic for new or changing moles - Recommend daily use of broad spectrum spf 30+ sunscreen to sun-exposed areas.   Hemangiomas - Red papules - Discussed benign nature - Observe - Call for any changes  Actinic Damage - Chronic condition, secondary to cumulative UV/sun exposure - diffuse scaly erythematous macules with underlying dyspigmentation - Recommend daily broad spectrum sunscreen SPF 30+ to sun-exposed areas, reapply every 2 hours as needed.  - Staying in the shade or wearing long sleeves, sun glasses (UVA+UVB protection) and wide brim hats (4-inch brim around the entire circumference of the hat) are also recommended for sun protection.  - Call for new or changing lesions.  Skin cancer screening performed today.  Return in about 3 months (around 02/16/2023) for AK follow up, 6 month FBSE.  IGraciella Belton RMA, am acting as scribe for VForest Gleason MD .  Documentation: I have reviewed the above documentation for accuracy and completeness, and I agree with the above.  Forest Gleason, MD

## 2022-11-19 ENCOUNTER — Telehealth: Payer: Self-pay

## 2022-11-19 ENCOUNTER — Other Ambulatory Visit: Payer: Self-pay

## 2022-11-19 DIAGNOSIS — C44319 Basal cell carcinoma of skin of other parts of face: Secondary | ICD-10-CM

## 2022-11-19 NOTE — Telephone Encounter (Signed)
-----  Message from Alfonso Patten, MD sent at 11/19/2022  5:41 PM EST ----- Skin , left forehead BASAL CELL CARCINOMA, SUPERFICIAL AND NODULAR PATTERNS --> Mohs at [place]  MAs please call with results and refer. Please let me know if they have any questions. Thank you!

## 2022-11-19 NOTE — Telephone Encounter (Signed)
Discussed pathology results. Patient voiced understanding. Referral sent to [facility] for Mohs

## 2022-12-01 DIAGNOSIS — C44319 Basal cell carcinoma of skin of other parts of face: Secondary | ICD-10-CM | POA: Diagnosis not present

## 2022-12-09 ENCOUNTER — Telehealth: Payer: Self-pay

## 2022-12-09 NOTE — Telephone Encounter (Signed)
Updated specimen tacking and history from [facility] progress notes/photos.

## 2022-12-24 DIAGNOSIS — H2513 Age-related nuclear cataract, bilateral: Secondary | ICD-10-CM | POA: Diagnosis not present

## 2022-12-24 DIAGNOSIS — E119 Type 2 diabetes mellitus without complications: Secondary | ICD-10-CM | POA: Diagnosis not present

## 2022-12-24 DIAGNOSIS — H43813 Vitreous degeneration, bilateral: Secondary | ICD-10-CM | POA: Diagnosis not present

## 2022-12-24 LAB — HM DIABETES EYE EXAM

## 2023-01-29 ENCOUNTER — Other Ambulatory Visit: Payer: Self-pay

## 2023-01-29 ENCOUNTER — Telehealth: Payer: Self-pay | Admitting: Family Medicine

## 2023-01-29 DIAGNOSIS — E1165 Type 2 diabetes mellitus with hyperglycemia: Secondary | ICD-10-CM

## 2023-01-29 MED ORDER — FREESTYLE LIBRE 2 SENSOR MISC: 11 refills | Status: DC

## 2023-01-29 NOTE — Telephone Encounter (Signed)
Sent and notified patient.  Zayne Marovich,cma

## 2023-01-29 NOTE — Telephone Encounter (Signed)
Pt need refill on libre sensor sent to [facility]

## 2023-02-03 ENCOUNTER — Telehealth: Payer: Self-pay | Admitting: Family Medicine

## 2023-02-03 NOTE — Telephone Encounter (Signed)
Contacted Jackson [name] to schedule their annual wellness visit. Appointment made for 02/05/2023.  Thank you,  [facility] Support [facility] Direct dial  [phone]

## 2023-02-05 ENCOUNTER — Ambulatory Visit (INDEPENDENT_AMBULATORY_CARE_PROVIDER_SITE_OTHER): Payer: Medicare Other

## 2023-02-05 VITALS — Ht 73.0 in | Wt 238.0 lb

## 2023-02-05 DIAGNOSIS — Z0000 Encounter for general adult medical examination without abnormal findings: Secondary | ICD-10-CM

## 2023-02-05 NOTE — Patient Instructions (Signed)
Jackson [name] , Thank you for taking time to come for your Medicare Wellness Visit. I appreciate your ongoing commitment to your health goals. Please review the following plan we discussed and let me know if I can assist you in the future.   These are the goals we discussed:  Goals       Patient Stated     Increase physical activity (pt-stated)      Be more consistent with walking and treadmill while continuing to lose weight. Up to 150 minutes per week.        This is a list of the screening recommended for you and due dates:  Health Maintenance  Topic Date Due   Hemoglobin A1C  10/28/2022   COVID-19 Vaccine (4 - 2023-24 season) 02/21/2023*   Zoster (Shingles) Vaccine (1 of 2) 05/07/2023*   Yearly kidney function blood test for diabetes  04/28/2023   Yearly kidney health urinalysis for diabetes  04/28/2023   Flu Shot  05/27/2023   Complete foot exam   09/05/2023   Eye exam for diabetics  12/24/2023   Medicare Annual Wellness Visit  02/05/2024   Colon Cancer Screening  10/26/2025   DTaP/Tdap/Td vaccine (2 - Td or Tdap) 03/21/2028   Pneumonia Vaccine  Completed   HPV Vaccine  Aged Out  *Topic was postponed. The date shown is not the original due date.   Conditions/risks identified: none new  Next appointment: Follow up in one year for your annual wellness visit.   Preventive Care 53 Years and Older, Male  Preventive care refers to lifestyle choices and visits with your health care provider that can promote health and wellness. What does preventive care include? A yearly physical exam. This is also called an annual well check. Dental exams once or twice a year. Routine eye exams. Ask your health care provider how often you should have your eyes checked. Personal lifestyle choices, including: Daily care of your teeth and gums. Regular physical activity. Eating a healthy diet. Avoiding tobacco and drug use. Limiting alcohol use. Practicing safe sex. Taking low doses of  aspirin every day. Taking vitamin and mineral supplements as recommended by your health care provider. What happens during an annual well check? The services and screenings done by your health care provider during your annual well check will depend on your age, overall health, lifestyle risk factors, and family history of disease. Counseling  Your health care provider may ask you questions about your: Alcohol use. Tobacco use. Drug use. Emotional well-being. Home and relationship well-being. Sexual activity. Eating habits. History of falls. Memory and ability to understand (cognition). Work and work Astronomer. Screening  You may have the following tests or measurements: Height, weight, and BMI. Blood pressure. Lipid and cholesterol levels. These may be checked every 5 years, or more frequently if you are over 71 years old. Skin check. Lung cancer screening. You may have this screening every year starting at age 33 if you have a 30-pack-year history of smoking and currently smoke or have quit within the past 15 years. Fecal occult blood test (FOBT) of the stool. You may have this test every year starting at age 63. Flexible sigmoidoscopy or colonoscopy. You may have a sigmoidoscopy every 5 years or a colonoscopy every 10 years starting at age 29. Prostate cancer screening. Recommendations will vary depending on your family history and other risks. Hepatitis C blood test. Hepatitis B blood test. Sexually transmitted disease (STD) testing. Diabetes screening. This is done by checking your  blood sugar (glucose) after you have not eaten for a while (fasting). You may have this done every 1-3 years. Abdominal aortic aneurysm (AAA) screening. You may need this if you are a current or former smoker. Osteoporosis. You may be screened starting at age 55 if you are at high risk. Talk with your health care provider about your test results, treatment options, and if necessary, the need for more  tests. Vaccines  Your health care provider may recommend certain vaccines, such as: Influenza vaccine. This is recommended every year. Tetanus, diphtheria, and acellular pertussis (Tdap, Td) vaccine. You may need a Td booster every 10 years. Zoster vaccine. You may need this after age 71. Pneumococcal 13-valent conjugate (PCV13) vaccine. One dose is recommended after age 67. Pneumococcal polysaccharide (PPSV23) vaccine. One dose is recommended after age 49. Talk to your health care provider about which screenings and vaccines you need and how often you need them. This information is not intended to replace advice given to you by your health care provider. Make sure you discuss any questions you have with your health care provider. Document Released: 11/08/2015 Document Revised: 07/01/2016 Document Reviewed: 08/13/2015 Elsevier Interactive Patient Education  2017 [place] Prevention in the Home Falls can cause injuries. They can happen to people of all ages. There are many things you can do to make your home safe and to help prevent falls. What can I do on the outside of my home? Regularly fix the edges of walkways and driveways and fix any cracks. Remove anything that might make you trip as you walk through a door, such as a raised step or threshold. Trim any bushes or trees on the path to your home. Use bright outdoor lighting. Clear any walking paths of anything that might make someone trip, such as rocks or tools. Regularly check to see if handrails are loose or broken. Make sure that both sides of any steps have handrails. Any raised decks and porches should have guardrails on the edges. Have any leaves, snow, or ice cleared regularly. Use sand or salt on walking paths during winter. Clean up any spills in your garage right away. This includes oil or grease spills. What can I do in the bathroom? Use night lights. Install grab bars by the toilet and in the tub and shower.  Do not use towel bars as grab bars. Use non-skid mats or decals in the tub or shower. If you need to sit down in the shower, use a plastic, non-slip stool. Keep the floor dry. Clean up any water that spills on the floor as soon as it happens. Remove soap buildup in the tub or shower regularly. Attach bath mats securely with double-sided non-slip rug tape. Do not have throw rugs and other things on the floor that can make you trip. What can I do in the bedroom? Use night lights. Make sure that you have a light by your bed that is easy to reach. Do not use any sheets or blankets that are too big for your bed. They should not hang down onto the floor. Have a firm chair that has side arms. You can use this for support while you get dressed. Do not have throw rugs and other things on the floor that can make you trip. What can I do in the kitchen? Clean up any spills right away. Avoid walking on wet floors. Keep items that you use a lot in easy-to-reach places. If you need to reach something above  you, use a strong step stool that has a grab bar. Keep electrical cords out of the way. Do not use floor polish or wax that makes floors slippery. If you must use wax, use non-skid floor wax. Do not have throw rugs and other things on the floor that can make you trip. What can I do with my stairs? Do not leave any items on the stairs. Make sure that there are handrails on both sides of the stairs and use them. Fix handrails that are broken or loose. Make sure that handrails are as long as the stairways. Check any carpeting to make sure that it is firmly attached to the stairs. Fix any carpet that is loose or worn. Avoid having throw rugs at the top or bottom of the stairs. If you do have throw rugs, attach them to the floor with carpet tape. Make sure that you have a light switch at the top of the stairs and the bottom of the stairs. If you do not have them, ask someone to add them for you. What else  can I do to help prevent falls? Wear shoes that: Do not have high heels. Have rubber bottoms. Are comfortable and fit you well. Are closed at the toe. Do not wear sandals. If you use a stepladder: Make sure that it is fully opened. Do not climb a closed stepladder. Make sure that both sides of the stepladder are locked into place. Ask someone to hold it for you, if possible. Clearly mark and make sure that you can see: Any grab bars or handrails. First and last steps. Where the edge of each step is. Use tools that help you move around (mobility aids) if they are needed. These include: Canes. Walkers. Scooters. Crutches. Turn on the lights when you go into a dark area. Replace any light bulbs as soon as they burn out. Set up your furniture so you have a clear path. Avoid moving your furniture around. If any of your floors are uneven, fix them. If there are any pets around you, be aware of where they are. Review your medicines with your doctor. Some medicines can make you feel dizzy. This can increase your chance of falling. Ask your doctor what other things that you can do to help prevent falls. This information is not intended to replace advice given to you by your health care provider. Make sure you discuss any questions you have with your health care provider. Document Released: 08/08/2009 Document Revised: 03/19/2016 Document Reviewed: 11/16/2014 Elsevier Interactive Patient Education  2017 [employer].

## 2023-02-05 NOTE — Progress Notes (Signed)
Subjective:   KYSON KUPPER is a 72 y.o. male who presents for Medicare Annual/Subsequent preventive examination.  Review of Systems    No ROS.  Medicare Wellness Virtual Visit.  Visual/audio telehealth visit, UTA vital signs.   See social history for additional risk factors.   Cardiac Risk Factors include: advanced age (>72men, >24 women);male gender     Objective:    Today's Vitals   02/05/23 1049  Weight: 238 lb (108 kg)  Height:  (1.854 m)   Body mass index is 31.4 kg/m.     02/05/2023   10:51 AM 06/21/2022    3:42 PM 04/30/2021    3:33 PM 04/30/2021    1:00 PM 09/09/2020    9:16 AM 09/07/2019    9:21 AM 10/03/2018    1:43 PM  Advanced Directives  Does Patient Have a Medical Advance Directive? No No No No No No No  Does patient want to make changes to medical advance directive?      No - Patient declined   Would patient like information on creating a medical advance directive? No - Patient declined  No - Patient declined No - Patient declined No - Patient declined  No - Patient declined    Current Medications (verified) Outpatient Encounter Medications as of 02/05/2023  Medication Sig   azelastine (ASTELIN) 0.1 % nasal spray USE 1 SPRAY IN EACH NOSTRIL TWICE A DAY AS DIRECTED   cholecalciferol (VITAMIN D3) 25 MCG (1000 UT) tablet Take 1,000 Units by mouth daily.   Coenzyme Q10 (COQ10) 200 MG CAPS Take 200 mg by mouth daily.    Continuous Blood Gluc Sensor (FREESTYLE LIBRE 2 SENSOR) MISC USE TO CHECK BLOOD SUGAR EVERY 8 HOURS   Dulaglutide (TRULICITY) 0.75 MG/0.5ML SOPN INJECT 0.5 ML (0.75 MG TOTAL) UNDER THE SKIN ONCE A WEEK   fexofenadine (ALLEGRA ALLERGY) 60 MG tablet Take 1 tablet (60 mg total) by mouth 2 (two) times daily.   finasteride (PROSCAR) 5 MG tablet Take 1 tablet (5 mg total) by mouth daily.   metFORMIN (GLUCOPHAGE) 1000 MG tablet Take 1 tablet (1,000 mg total) by mouth daily. With food   metoprolol succinate (TOPROL-XL) 50 MG 24 hr tablet TAKE 1  TABLET DAILY WITH OR IMMEDIATELY FOLLOWING A MEAL (DOSE CHANGE)   Multiple Vitamins-Minerals (MULTIVITAMIN ADULT PO) Take 1 tablet by mouth daily.    Omega-3 Fatty Acids (FISH OIL) 1200 MG CPDR Take 1 tablet by mouth daily.   omeprazole (PRILOSEC) 40 MG capsule Take 1 capsule (40 mg total) by mouth daily. 30 min before food   tamsulosin (FLOMAX) 0.4 MG CAPS capsule Take 1 capsule (0.4 mg total) by mouth at bedtime.   triamcinolone ointment (KENALOG) 0.1 % Apply 1 application topically daily. As needed and cover with bandaid. Avoid face, groin, underarms.   vitamin C (ASCORBIC ACID) 500 MG tablet Take 1,000 mg by mouth daily.    Vitamin E 400 units TABS Take 400 Units by mouth daily.    No facility-administered encounter medications on file as of 02/05/2023.    Allergies (verified) Januvia [sitagliptin], Lipitor [atorvastatin], and Statins   History: Past Medical History:  Diagnosis Date   Actinic keratosis    Allergy    Anemia    Arthritis    Basal cell carcinoma    Basal cell carcinoma 11/17/2022   Left forehead. Superficial and nodular pattern. Mohs 12/01/2022   BCC (basal cell carcinoma) 06/03/2022   left forearm, EDC 06/23/22   BCC (basal cell  carcinoma) 06/03/2022   right forearm, EDC 06/23/22   Bradycardia 03/03/2018   Cancer    BASAL CELL-FACE   Cholecystitis    Complication of anesthesia    PT IS A RED HEAD AND STATES IT TAKES ALOT MORE ANESTHESIA TO SEDATE HIM   Diabetes mellitus without complication    Dysrhythmia    SVT   Elevated PSA 06/13/2019   Fatty liver    GERD (gastroesophageal reflux disease)    Hyperlipidemia    Melanoma 02/02/2019   Left forearm. Superficial spreading. Tumor thickness 0.63mm, anatomic level III. Excised: 02/22/2019, margins free.   Multiple thyroid nodules    Paroxysmal SVT (supraventricular tachycardia) 05/27/2016   Rheumatic fever    Sleep apnea 1997   Uses C-Pap machine   SVT (supraventricular tachycardia)    Thyroid nodule     Past Surgical History:  Procedure Laterality Date   APPENDECTOMY  1965   BICEPS TENDON REPAIR Right 05/2015   COLONOSCOPY  2016   LUMBAR FUSION  2015   L4/L5   RHINOPLASTY  1997   SHOULDER SURGERY Left    2001   TONSILLECTOMY  1956   VASECTOMY  1981   Family History  Problem Relation Age of Onset   Arthritis Mother    Lung cancer Mother    Hyperlipidemia Mother    Diabetes Mother    Cholelithiasis Mother        gb removed   Arthritis Father    Diabetes Father    Lung disease Father    Pancreatitis Father        died age 44 pancreatitis   Cholelithiasis Brother        GB removed   Hyperlipidemia Maternal Grandmother    Alcohol abuse Maternal Grandfather    Hyperlipidemia Maternal Grandfather    Drug abuse Cousin    Hyperlipidemia Other        maternal side of family    Cholelithiasis Daughter        GB removed   Social History   Socioeconomic History   Marital status: Married    Spouse name: Not on file   Number of children: Not on file   Years of education: Not on file   Highest education level: Not on file  Occupational History   Not on file  Tobacco Use   Smoking status: Never   Smokeless tobacco: Former    Types: Snuff  Vaping Use   Vaping Use: Never used  Substance and Sexual Activity   Alcohol use: Yes    Alcohol/week: 1.0 - 2.0 standard drink of alcohol    Types: 1 - 2 Cans of beer per week    Comment: OCC   Drug use: No   Sexual activity: Not Currently  Other Topics Concern   Not on file  Social History Narrative   Married    Former Hotel manager    2 daughters   Social Determinants of Health   Financial Resource Strain: Low Risk  (02/03/2023)   Overall Financial Resource Strain (CARDIA)    Difficulty of Paying Living Expenses: Not hard at all  Food Insecurity: No Food Insecurity (02/03/2023)   Hunger Vital Sign    Worried About Running Out of Food in the Last Year: Never true    Ran Out of Food in the Last Year: Never true   Transportation Needs: No Transportation Needs (02/03/2023)   PRAPARE - Administrator, Civil Service (Medical): No    Lack of Transportation (Non-Medical):  No  Physical Activity: Insufficiently Active (02/03/2023)   Exercise Vital Sign    Days of Exercise per Week: 3 days    Minutes of Exercise per Session: 30 min  Stress: No Stress Concern Present (02/03/2023)   [employer] of Occupational Health - Occupational Stress Questionnaire    Feeling of Stress : Not at all  Social Connections: Unknown (02/03/2023)   Social Connection and Isolation Panel [NHANES]    Frequency of Communication with Friends and Family: More than three times a week    Frequency of Social Gatherings with Friends and Family: More than three times a week    Attends Religious Services: Not on Marketing executive or Organizations: Yes    Attends Engineer, structural: More than 4 times per year    Marital Status: Married    Tobacco Counseling Counseling given: Not Answered   Clinical Intake:  Pre-visit preparation completed: Yes    Nutrition Risk Assessment: Has the patient had any N/V/D within the last 2 months?  No  Does the patient have any non-healing wounds?  No  Has the patient had any unintentional weight loss or weight gain?  No   Diabetes:  Is the patient diabetic?  Yes  If diabetic, was a CBG obtained today?  Yes , 149 Did the patient bring in their glucometer from home?  No  How often do you monitor your CBG's? 3-4 daily. Libre in use.   Financial Strains and Diabetes Management: Are you having any financial strains with the device, your supplies or your medication? No .  Does the patient want to be seen by Chronic Care Management for management of their diabetes?  No  Would the patient like to be referred to a Nutritionist or for Diabetic Management?  No       Diabetes: Yes (Followed by pcp)  How often do you need to have someone help you when you read  instructions, pamphlets, or other written materials from your doctor or pharmacy?: 1 - Never    Interpreter Needed?: No      Activities of Daily Living    02/03/2023   11:36 AM  In your present state of health, do you have any difficulty performing the following activities:  Hearing? 1  Vision? 0  Difficulty concentrating or making decisions? 0  Walking or climbing stairs? 0  Dressing or bathing? 0  Doing errands, shopping? 0  Preparing Food and eating ? N  Using the Toilet? N  In the past six months, have you accidently leaked urine? N  Do you have problems with loss of bowel control? N  Managing your Medications? N  Managing your Finances? N  Housekeeping or managing your Housekeeping? N    Patient Care Team: Dana Allan, MD as PCP - General (Family Medicine) Antonieta Iba, MD as PCP - Cardiology (Cardiology) Tommie Sams, DO as Consulting Physician (Family Medicine) Lemar Livings Merrily Pew, MD (General Surgery)  Indicate any recent Medical Services you may have received from other than [facility] providers in the past year (date may be approximate).     Assessment:   This is a routine wellness examination for Talbert.  Patient Medicare AWV questionnaire was completed by the patient on 02/03/23 I have confirmed that all information answered by patient is correct and no changes since this date.   I connected with  Gwinda Maine on 02/05/23 by a audio enabled telemedicine application and verified that I am speaking  with the correct person using two identifiers.  Patient Location: Home  Provider Location: Office/Clinic  I discussed the limitations of evaluation and management by telemedicine. The patient expressed understanding and agreed to proceed.   Hearing/Vision screen Hearing Screening - Comments:: Patient has difficulty hearing conversational tones. Hearing aids in use intermittently. Followed by [facility].   Vision Screening - Comments:: Followed by  [facility] Wears readers lenses and corrective sunglass  They have seen their ophthalmologist in the last 12 months.    Dietary issues and exercise activities discussed: Current Exercise Habits: Home exercise routine, Type of exercise: walking, Time (Minutes): 30, Frequency (Times/Week): 3, Weekly Exercise (Minutes/Week): 90, Intensity: Mild   Goals Addressed   None    Depression Screen    02/05/2023   10:50 AM 09/09/2022    2:20 PM 08/10/2022    1:57 PM 04/24/2022    2:35 PM 05/13/2021   10:12 AM 09/09/2020    9:06 AM 03/19/2020    3:09 PM  PHQ 2/9 Scores  PHQ - 2 Score 0 0 0 0 1 0 0    Fall Risk    02/03/2023   11:36 AM 09/09/2022    2:20 PM 08/10/2022    1:57 PM 04/24/2022    2:35 PM 05/13/2021   10:12 AM  Fall Risk   Falls in the past year? 1 0 0 0 1  Number falls in past yr: 0 0 0 0 1  Injury with Fall? 0 0 0 0 0  Risk for fall due to :  No Fall Risks No Fall Risks No Fall Risks History of fall(s)  Follow up Falls evaluation completed;Falls prevention discussed Falls evaluation completed Falls evaluation completed Falls evaluation completed Falls evaluation completed    FALL RISK PREVENTION PERTAINING TO THE HOME: Home free of loose throw rugs in walkways, pet beds, electrical cords, etc? Yes  Adequate lighting in your home to reduce risk of falls? Yes   ASSISTIVE DEVICES UTILIZED TO PREVENT FALLS: Life alert? No  Use of a cane, walker or w/c? No  Grab bars in the bathroom? No  Shower chair or bench in shower? No  Elevated toilet seat or a handicapped toilet? No   TIMED UP AND GO: Was the test performed? No .   Cognitive Function:    09/01/2018    9:52 AM  MMSE - Mini Mental State Exam  Orientation to time 5  Orientation to Place 5  Registration 3  Attention/ Calculation 5  Recall 3  Language- name 2 objects 2  Language- repeat 1  Language- follow 3 step command 3  Language- read & follow direction 1  Write a sentence 1  Copy design 1   Total score 30        02/05/2023   10:57 AM 09/07/2019    9:21 AM  6CIT Screen  What Year? 0 points 0 points  What month? 0 points 0 points  What time? 0 points 0 points  Count back from 20 0 points 0 points  Months in reverse 0 points 0 points  Repeat phrase 0 points 0 points  Total Score 0 points 0 points    Immunizations Immunization History  Administered Date(s) Administered   Fluad Quad(high Dose 65+) 08/09/2020   Hep A / Hep B 03/03/2018, 03/31/2018, 09/01/2018   Influenza, High Dose Seasonal PF 07/04/2018, 07/30/2019   Influenza,inj,Quad PF,6+ Mos 07/20/2017   Influenza-Unspecified 07/04/2016, 08/10/2019   PFIZER(Purple Top)SARS-COV-2 Vaccination 01/04/2020, 02/01/2020, 09/05/2020   Pneumococcal  Conjugate-13 01/20/2018   Pneumococcal Polysaccharide-23 12/18/2020   Tdap 03/21/2018    Shingrix Completed?: No.    Education has been provided regarding the importance of this vaccine. Patient has been advised to call insurance company to determine out of pocket expense if they have not yet received this vaccine. Advised may also receive vaccine at local pharmacy or Health Dept. Verbalized acceptance and understanding.Declined.  Screening Tests Health Maintenance  Topic Date Due   HEMOGLOBIN A1C  10/28/2022   COVID-19 Vaccine (4 - 2023-24 season) 02/21/2023 (Originally 06/26/2022)   Zoster Vaccines- Shingrix (1 of 2) 05/07/2023 (Originally 08/11/1970)   Diabetic kidney evaluation - eGFR measurement  04/28/2023   Diabetic kidney evaluation - Urine ACR  04/28/2023   INFLUENZA VACCINE  05/27/2023   FOOT EXAM  09/05/2023   OPHTHALMOLOGY EXAM  12/24/2023   Medicare Annual Wellness (AWV)  02/05/2024   COLONOSCOPY (Pts 45-28yrs Insurance coverage will need to be confirmed)  10/26/2025   DTaP/Tdap/Td (2 - Td or Tdap) 03/21/2028   Pneumonia Vaccine 65+ Years old  Completed   HPV VACCINES  Aged Out    Health Maintenance Health Maintenance Due  Topic Date Due   HEMOGLOBIN  A1C  10/28/2022   Lung Cancer Screening: (Low Dose CT Chest recommended if Age 40-80 years, 30 pack-year currently smoking OR have quit w/in 15years.) does not qualify.   Vision Screening: Recommended annual ophthalmology exams for early detection of glaucoma and other disorders of the eye.  Dental Screening: Recommended annual dental exams for proper oral hygiene.  Community Resource Referral / Chronic Care Management: CRR required this visit?  No   CCM required this visit?  No      Plan:     I have personally reviewed and noted the following in the patient's chart:   Medical and social history Use of alcohol, tobacco or illicit drugs  Current medications and supplements including opioid prescriptions. Patient is not currently taking opioid prescriptions. Functional ability and status Nutritional status Physical activity Advanced directives List of other physicians Hospitalizations, surgeries, and ER visits in previous 12 months Vitals Screenings to include cognitive, depression, and falls Referrals and appointments  In addition, I have reviewed and discussed with patient certain preventive protocols, quality metrics, and best practice recommendations. A written personalized care plan for preventive services as well as general preventive health recommendations were provided to patient.     Cathey Endow, LPN   07/09/7828

## 2023-02-17 ENCOUNTER — Encounter: Payer: Self-pay | Admitting: Dermatology

## 2023-02-17 ENCOUNTER — Ambulatory Visit (INDEPENDENT_AMBULATORY_CARE_PROVIDER_SITE_OTHER): Payer: Medicare Other | Admitting: Dermatology

## 2023-02-17 VITALS — BP 165/93 | HR 67

## 2023-02-17 DIAGNOSIS — L578 Other skin changes due to chronic exposure to nonionizing radiation: Secondary | ICD-10-CM

## 2023-02-17 DIAGNOSIS — L821 Other seborrheic keratosis: Secondary | ICD-10-CM

## 2023-02-17 DIAGNOSIS — L570 Actinic keratosis: Secondary | ICD-10-CM

## 2023-02-17 DIAGNOSIS — C44519 Basal cell carcinoma of skin of other part of trunk: Secondary | ICD-10-CM | POA: Diagnosis not present

## 2023-02-17 DIAGNOSIS — Z85828 Personal history of other malignant neoplasm of skin: Secondary | ICD-10-CM

## 2023-02-17 DIAGNOSIS — L309 Dermatitis, unspecified: Secondary | ICD-10-CM

## 2023-02-17 DIAGNOSIS — D485 Neoplasm of uncertain behavior of skin: Secondary | ICD-10-CM

## 2023-02-17 NOTE — Progress Notes (Signed)
Follow-Up Visit   Subjective  Jackson [name] is a 72 y.o. male who presents for the following: recheck BCC site at the L forehead, tx with Mohs in February. Recheck Aks Left Forearm x 1, R sup shoulder x 1, R crua x 1, L antihelix x 1. The patient has spots, moles and lesions to be evaluated, some may be new or changing.  The following portions of the chart were reviewed this encounter and updated as appropriate: medications, allergies, medical history  Review of Systems:  No other skin or systemic complaints except as noted in HPI or Assessment and Plan.  Objective  Well appearing patient in no apparent distress; mood and affect are within normal limits.   A focused examination was performed of the following areas: the face, ears, and upper body   Relevant exam findings are noted in the Assessment and Plan.  Right Upper Back 0.6 cm thin pink papule        Assessment & Plan   ACTINIC DAMAGE WITH PRECANCEROUS ACTINIC KERATOSES Counseling for Topical Chemotherapy Management: Patient exhibits: - Severe, confluent actinic changes with pre-cancerous actinic keratoses that is secondary to cumulative UV radiation exposure over time - Condition that is severe; chronic, not at goal. - diffuse scaly erythematous macules and papules with underlying dyspigmentation - Discussed Prescription "Field Treatment" topical Chemotherapy for Severe, Chronic Confluent Actinic Changes with Pre-Cancerous Actinic Keratoses Field treatment involves treatment of an entire area of skin that has confluent Actinic Changes (Sun/ Ultraviolet light damage) and PreCancerous Actinic Keratoses by method of PhotoDynamic Therapy (PDT) and/or prescription Topical Chemotherapy agents such as 5-fluorouracil, 5-fluorouracil/calcipotriene, and/or imiquimod.  The purpose is to decrease the number of clinically evident and subclinical PreCancerous lesions to prevent progression to development of skin cancer by chemically  destroying early precancer changes that may or may not be visible.  It has been shown to reduce the risk of developing skin cancer in the treated area. As a result of treatment, redness, scaling, crusting, and open sores may occur during treatment course. One or more than one of these methods may be used and may have to be used several times to control, suppress and eliminate the PreCancerous changes. Discussed treatment course, expected reaction, and possible side effects. - Recommend daily broad spectrum sunscreen SPF 30+ to sun-exposed areas, reapply every 2 hours as needed.  - Staying in the shade or wearing long sleeves, sun glasses (UVA+UVB protection) and wide brim hats (4-inch brim around the entire circumference of the hat) are also recommended. - Call for new or changing lesions. - Pt to spot treat AK on L ear helix, L forearm, and hands with 5FU/Calcipotriene BID x 5 days to ear and BID x 7 days to the L forearm and hands.  Reviewed course of treatment and expected reaction.  Patient advised to expect inflammation and crusting and advised that erosions are possible.  Patient advised to be diligent with sun protection during and after treatment. Handout with details of how to apply medication and what to expect provided. Counseled to keep medication out of reach of children and pets.   SEBORRHEIC KERATOSIS - Stuck-on, waxy, tan-brown papules and/or plaques  - Benign-appearing - Discussed benign etiology and prognosis. - Observe - Call for any changes  Neoplasm of uncertain behavior of skin Right Upper Back  Epidermal / dermal shaving  Lesion diameter (cm):  0.6 Informed consent: discussed and consent obtained   Timeout: patient name, date of birth, surgical site, and procedure verified  Procedure prep:  Patient was prepped and draped in usual sterile fashion Prep type:  Isopropyl alcohol Anesthesia: the lesion was anesthetized in a standard fashion   Anesthetic:  1% lidocaine w/  epinephrine 1-100,000 buffered w/ 8.4% NaHCO3 Instrument used: flexible razor blade   Hemostasis achieved with: pressure, aluminum chloride and electrodesiccation   Outcome: patient tolerated procedure well   Post-procedure details: sterile dressing applied and wound care instructions given   Dressing type: bandage and petrolatum    Destruction of lesion Complexity: extensive   Destruction method: electrodesiccation and curettage   Informed consent: discussed and consent obtained   Timeout:  patient name, date of birth, surgical site, and procedure verified Procedure prep:  Patient was prepped and draped in usual sterile fashion Prep type:  Isopropyl alcohol Anesthesia: the lesion was anesthetized in a standard fashion   Anesthetic:  1% lidocaine w/ epinephrine 1-100,000 buffered w/ 8.4% NaHCO3 Curettage performed in three different directions: Yes   Electrodesiccation performed over the curetted area: Yes   Final wound size (cm):  1.4 Hemostasis achieved with:  pressure, aluminum chloride and electrodesiccation Outcome: patient tolerated procedure well with no complications   Post-procedure details: sterile dressing applied and wound care instructions given   Dressing type: bandage and petrolatum    Specimen 1 - Surgical pathology Differential Diagnosis: D48.5 r/o BCC ED&C today  Check Margins: No  HISTORY OF BASAL CELL CARCINOMA OF THE SKIN - L forehead - No evidence of recurrence today - Recommend regular full body skin exams - Recommend daily broad spectrum sunscreen SPF 30+ to sun-exposed areas, reapply every 2 hours as needed.  - Call if any new or changing lesions are noted between office visits  Dermatitis secondary to 5FU/Calcipotriene mix   Exam: Scaly pink papules coalescing to plaques  Treatment Plan: Patient deferred treatment at this time.   Return for appointment as scheduled .  Maylene Roes, CMA, am acting as scribe for Darden Dates, MD  .  Documentation: I have reviewed the above documentation for accuracy and completeness, and I agree with the above.  Darden Dates, MD

## 2023-02-17 NOTE — Patient Instructions (Addendum)
If it does not give trouble with his blood sugar (he does continuous monitoring), recommend Niacinamide or Nicotinamide  twice per day to lower risk of non-melanoma skin cancer by approximately 25%. This is usually available at [employer].  Electrodesiccation and Curettage ("Scrape and Burn") Wound Care Instructions  Leave the original bandage on for 24 hours if possible.  If the bandage becomes soaked or soiled before that time, it is OK to remove it and examine the wound.  A small amount of post-operative bleeding is normal.  If excessive bleeding occurs, remove the bandage, place gauze over the site and apply continuous pressure (no peeking) over the area for 30 minutes. If this does not work, please call our clinic as soon as possible or page your doctor if it is after hours.   Once a day, cleanse the wound with soap and water. It is fine to shower. If a thick crust develops you may use a Q-tip dipped into dilute hydrogen peroxide (mix 1:1 with water) to dissolve it.  Hydrogen peroxide can slow the healing process, so use it only as needed.    After washing, apply petroleum jelly (Vaseline) or an antibiotic ointment if your doctor prescribed one for you, followed by a bandage.    For best healing, the wound should be covered with a layer of ointment at all times. If you are not able to keep the area covered with a bandage to hold the ointment in place, this may mean re-applying the ointment several times a day.  Continue this wound care until the wound has healed and is no longer open. It may take several weeks for the wound to heal and close.  Itching and mild discomfort is normal during the healing process.  If you have any discomfort, you can take Tylenol (acetaminophen) or ibuprofen as directed on the bottle. (Please do not take these if you have an allergy to them or cannot take them for another reason).  Some redness, tenderness and white or yellow material in the wound is normal  healing.  If the area becomes very sore and red, or develops a thick yellow-green material (pus), it may be infected; please notify us.    Wound healing continues for up to one year following surgery. It is not unusual to experience pain in the scar from time to time during the interval.  If the pain becomes severe or the scar thickens, you should notify the office.    A slight amount of redness in a scar is expected for the first six months.  After six months, the redness will fade and the scar will soften and fade.  The color difference becomes less noticeable with time.  If there are any problems, return for a post-op surgery check at your earliest convenience.  To improve the appearance of the scar, you can use silicone scar gel, cream, or sheets (such as Mederma or Serica) every night for up to one year. These are available over the counter (without a prescription).  Please call our office at [phone] for any questions or concerns.  Due to recent changes in healthcare laws, you may see results of your pathology and/or laboratory studies on MyChart before the doctors have had a chance to review them. We understand that in some cases there may be results that are confusing or concerning to you. Please understand that not all results are received at the same time and often the doctors may need to interpret multiple results in order  to provide you with the best plan of care or course of treatment. Therefore, we ask that you please give [place] 2 business days to thoroughly review all your results before contacting the office for clarification. Should we see a critical lab result, you will be contacted sooner.   If You Need Anything After Your Visit  If you have any questions or concerns for your doctor, please call our main line at [phone] and press option 4 to reach your doctor's medical assistant. If no one answers, please leave a voicemail as directed and we will return your call as soon as  possible. Messages left after 4 pm will be answered the following business day.   You may also send [place] a message via MyChart. We typically respond to MyChart messages within 1-2 business days.  For prescription refills, please ask your pharmacy to contact our office. Our fax number is [phone].  If you have an urgent issue when the clinic is closed that cannot wait until the next business day, you can page your doctor at the number below.    Please note that while we do our best to be available for urgent issues outside of office hours, we are not available 24/7.   If you have an urgent issue and are unable to reach [place], you may choose to seek medical care at your doctor's office, retail clinic, urgent care center, or emergency room.  If you have a medical emergency, please immediately call 911 or go to the emergency department.  Pager Numbers  - Dr. Gwen Pounds: [phone]  - Dr. Neale Burly: [phone]  - Dr. Roseanne Reno: [phone]  In the event of inclement weather, please call our main line at [phone] for an update on the status of any delays or closures.  Dermatology Medication Tips: Please keep the boxes that topical medications come in in order to help keep track of the instructions about where and how to use these. Pharmacies typically print the medication instructions only on the boxes and not directly on the medication tubes.   If your medication is too expensive, please contact our office at [phone] option 4 or send [place] a message through MyChart.   We are unable to tell what your co-pay for medications will be in advance as this is different depending on your insurance coverage. However, we may be able to find a substitute medication at lower cost or fill out paperwork to get insurance to cover a needed medication.   If a prior authorization is required to get your medication covered by your insurance company, please allow [place] 1-2 business days to complete this  process.  Drug prices often vary depending on where the prescription is filled and some pharmacies may offer cheaper prices.  The website www.goodrx.com contains coupons for medications through different pharmacies. The prices here do not account for what the cost may be with help from insurance (it may be cheaper with your insurance), but the website can give you the price if you did not use any insurance.  - You can print the associated coupon and take it with your prescription to the pharmacy.  - You may also stop by our office during regular business hours and pick up a GoodRx coupon card.  - If you need your prescription sent electronically to a different pharmacy, notify our office through [facility] or by phone at [phone] option 4.     Si Usted Necesita Algo Despus de Su Visita  Tambin puede enviarnos un  mensaje a travs de MyChart. Por lo general respondemos a los mensajes de MyChart en el transcurso de 1 a 2 das hbiles.  Para renovar recetas, por favor pida a su farmacia que se ponga en contacto con nuestra oficina. Annie Sable de fax es [place] [phone].  Si tiene un asunto urgente cuando la clnica est cerrada y que no puede esperar hasta el siguiente da hbil, puede llamar/localizar a su doctor(a) al nmero que aparece a continuacin.   Por favor, tenga en cuenta que aunque hacemos todo lo posible para estar disponibles para asuntos urgentes fuera del horario de [place], no estamos disponibles las 24 horas del da, los 7 [address]  [address] de [place].   Si tiene un problema urgente y no puede comunicarse con nosotros, puede optar por buscar atencin mdica  en el consultorio de su doctor(a), en una clnica privada, en un centro de atencin urgente o en una sala de emergencias.  Si tiene Engineer, drilling, por favor llame inmediatamente al 911 o vaya a la sala de emergencias.  Nmeros de bper  - Dr. Gwen Pounds: [phone]  - Dra. Moye: [phone]  - Dra.  Roseanne Reno: [phone]  En caso de inclemencias del [place], por favor llame a Lacy Duverney principal al [phone] para una actualizacin sobre el [place] de cualquier retraso o cierre.  Consejos para la medicacin en dermatologa: Por favor, guarde las cajas en las que vienen los medicamentos de uso tpico para ayudarle a seguir las instrucciones sobre dnde y cmo usarlos. Las farmacias generalmente imprimen las instrucciones del medicamento slo en las cajas y no directamente en los tubos del [place].   Si su medicamento es muy caro, por favor, pngase en contacto con Rolm Gala llamando al [phone] y presione la opcin 4 o envenos un mensaje a travs de Clinical cytogeneticist.   No podemos decirle cul ser su copago por los medicamentos por adelantado ya que esto es diferente dependiendo de la cobertura de su seguro. Sin embargo, es posible que podamos encontrar un medicamento sustituto a Audiological scientist un formulario para que el seguro cubra el medicamento que se considera necesario.   Si se requiere una autorizacin previa para que su compaa de seguros [place] su medicamento, por favor permtanos de 1 a 2 das hbiles para completar [address]  Los precios de los medicamentos varan con frecuencia dependiendo del Environmental consultant de dnde se surte la receta y alguna farmacias pueden ofrecer precios ms baratos.  El sitio web www.goodrx.com tiene cupones para medicamentos de Health and safety inspector. Los precios aqu no tienen en cuenta lo que podra costar con la ayuda del seguro (puede ser ms barato con su seguro), pero el sitio web puede darle el precio si no utiliz Tourist information centre manager.  - Puede imprimir el cupn correspondiente y llevarlo con su receta a la farmacia.  - Tambin puede pasar por nuestra oficina durante el horario de atencin regular y Education officer, museum una tarjeta de cupones de GoodRx.  - Si necesita que su receta se enve electrnicamente a una farmacia diferente, informe a nuestra oficina a  travs de MyChart de [place] o por telfono llamando al [phone] y presione la opcin 4.

## 2023-02-23 ENCOUNTER — Telehealth: Payer: Self-pay

## 2023-02-23 NOTE — Telephone Encounter (Signed)
Discussed pathology results. Patient voiced understanding. Recheck at follow up.  

## 2023-02-23 NOTE — Telephone Encounter (Signed)
-----   Message from Sandi Mealy, MD sent at 02/23/2023  1:33 PM EDT ----- Skin , right upper back SUPERFICIAL BASAL CELL CARCINOMA --> already treated with [facility]  MAs please call. Thank you!

## 2023-04-15 ENCOUNTER — Encounter: Payer: Self-pay | Admitting: Family Medicine

## 2023-04-16 ENCOUNTER — Other Ambulatory Visit: Payer: Self-pay | Admitting: Family Medicine

## 2023-04-16 DIAGNOSIS — I471 Supraventricular tachycardia, unspecified: Secondary | ICD-10-CM

## 2023-04-16 DIAGNOSIS — I152 Hypertension secondary to endocrine disorders: Secondary | ICD-10-CM

## 2023-04-16 MED ORDER — METOPROLOL SUCCINATE ER 50 MG PO TB24: ORAL_TABLET | ORAL | 3 refills | Status: DC

## 2023-04-16 NOTE — Telephone Encounter (Signed)
noted 

## 2023-05-20 ENCOUNTER — Encounter: Payer: Medicare Other | Admitting: Dermatology

## 2023-06-21 ENCOUNTER — Encounter: Payer: Medicare Other | Admitting: Dermatology

## 2023-06-22 DIAGNOSIS — E042 Nontoxic multinodular goiter: Secondary | ICD-10-CM | POA: Diagnosis not present

## 2023-06-29 DIAGNOSIS — E042 Nontoxic multinodular goiter: Secondary | ICD-10-CM | POA: Diagnosis not present

## 2023-07-05 ENCOUNTER — Ambulatory Visit (INDEPENDENT_AMBULATORY_CARE_PROVIDER_SITE_OTHER): Payer: Medicare Other | Admitting: Dermatology

## 2023-07-05 ENCOUNTER — Other Ambulatory Visit: Payer: Self-pay

## 2023-07-05 ENCOUNTER — Encounter: Payer: Self-pay | Admitting: Dermatology

## 2023-07-05 VITALS — BP 158/84 | HR 64

## 2023-07-05 DIAGNOSIS — Z85820 Personal history of malignant melanoma of skin: Secondary | ICD-10-CM

## 2023-07-05 DIAGNOSIS — D229 Melanocytic nevi, unspecified: Secondary | ICD-10-CM

## 2023-07-05 DIAGNOSIS — L570 Actinic keratosis: Secondary | ICD-10-CM

## 2023-07-05 DIAGNOSIS — L820 Inflamed seborrheic keratosis: Secondary | ICD-10-CM

## 2023-07-05 DIAGNOSIS — L738 Other specified follicular disorders: Secondary | ICD-10-CM

## 2023-07-05 DIAGNOSIS — L578 Other skin changes due to chronic exposure to nonionizing radiation: Secondary | ICD-10-CM

## 2023-07-05 DIAGNOSIS — W908XXA Exposure to other nonionizing radiation, initial encounter: Secondary | ICD-10-CM | POA: Diagnosis not present

## 2023-07-05 DIAGNOSIS — L814 Other melanin hyperpigmentation: Secondary | ICD-10-CM

## 2023-07-05 DIAGNOSIS — D2271 Melanocytic nevi of right lower limb, including hip: Secondary | ICD-10-CM

## 2023-07-05 DIAGNOSIS — L821 Other seborrheic keratosis: Secondary | ICD-10-CM | POA: Diagnosis not present

## 2023-07-05 DIAGNOSIS — D239 Other benign neoplasm of skin, unspecified: Secondary | ICD-10-CM

## 2023-07-05 DIAGNOSIS — K219 Gastro-esophageal reflux disease without esophagitis: Secondary | ICD-10-CM

## 2023-07-05 DIAGNOSIS — Z1283 Encounter for screening for malignant neoplasm of skin: Secondary | ICD-10-CM

## 2023-07-05 DIAGNOSIS — D1801 Hemangioma of skin and subcutaneous tissue: Secondary | ICD-10-CM | POA: Diagnosis not present

## 2023-07-05 DIAGNOSIS — D485 Neoplasm of uncertain behavior of skin: Secondary | ICD-10-CM

## 2023-07-05 DIAGNOSIS — Z85828 Personal history of other malignant neoplasm of skin: Secondary | ICD-10-CM | POA: Diagnosis not present

## 2023-07-05 HISTORY — DX: Other benign neoplasm of skin, unspecified: D23.9

## 2023-07-05 MED ORDER — OMEPRAZOLE 40 MG PO CPDR: 40.0000 mg | DELAYED_RELEASE_CAPSULE | Freq: Every day | ORAL | 3 refills | Status: DC

## 2023-07-05 NOTE — Telephone Encounter (Signed)
Refill sent in to [facility].

## 2023-07-05 NOTE — Telephone Encounter (Signed)
Patient states he is returning a call from [employer], CMA.  I read Kelly's message to patient.

## 2023-07-05 NOTE — Patient Instructions (Addendum)

## 2023-07-05 NOTE — Progress Notes (Signed)
Follow-Up Visit   Subjective  Jackson [name] is a 72 y.o. male who presents for the following: Skin Cancer Screening and Full Body Skin Exam  The patient presents for Total-Body Skin Exam (TBSE) for skin cancer screening and mole check. The patient has spots, moles and lesions to be evaluated, some may be new or changing and the patient may have concern these could be cancer.  The following portions of the chart were reviewed this encounter and updated as appropriate: medications, allergies, medical history  Review of Systems:  No other skin or systemic complaints except as noted in HPI or Assessment and Plan.  Objective  Well appearing patient in no apparent distress; mood and affect are within normal limits.  A full examination was performed including scalp, head, eyes, ears, nose, lips, neck, chest, axillae, abdomen, back, buttocks, bilateral upper extremities, bilateral lower extremities, hands, feet, fingers, toes, fingernails, and toenails. All findings within normal limits unless otherwise noted below.   Relevant physical exam findings are noted in the Assessment and Plan.  R mid cheek x 1, R sup lat forehead x 1, R temple x 1, R cutaneous lip near vermillion border x 1, nasal dorsum x 1, R antihelix x 1, R dorsal hand x 1, R forearm x 1, L forearm x 4, L eyebrow x 1, L temple x 1, L preauricular x 1, L antihelix x 1 (16) Erythematous thin papules/macules with gritty scale.   R lat thigh 0.6 cm pigmented macule        Assessment & Plan   SKIN CANCER SCREENING PERFORMED TODAY.  ACTINIC DAMAGE - Chronic condition, secondary to cumulative UV/sun exposure - diffuse scaly erythematous macules with underlying dyspigmentation - Recommend daily broad spectrum sunscreen SPF 30+ to sun-exposed areas, reapply every 2 hours as needed.  - Staying in the shade or wearing long sleeves, sun glasses (UVA+UVB protection) and wide brim hats (4-inch brim around the entire circumference of  the hat) are also recommended for sun protection.  - Call for new or changing lesions.  LENTIGINES, SEBORRHEIC KERATOSES, HEMANGIOMAS - Benign normal skin lesions - Benign-appearing - Call for any changes  MELANOCYTIC NEVI - Tan-brown and/or pink-flesh-colored symmetric macules and papules - Benign appearing on exam today - Observation - Call clinic for new or changing moles - Recommend daily use of broad spectrum spf 30+ sunscreen to sun-exposed areas.   HISTORY OF BASAL CELL CARCINOMA OF THE SKIN - L forehead, R upper back, R forearm, L forearm - No evidence of recurrence today - Recommend regular full body skin exams - Recommend daily broad spectrum sunscreen SPF 30+ to sun-exposed areas, reapply every 2 hours as needed.  - Call if any new or changing lesions are noted between office visits  HISTORY OF MELANOMA - Left forearm. Superficial spreading. Tumor thickness 0.53mm, anatomic level III. Excised: 02/22/2019, margins free.  - No evidence of recurrence today - No lymphadenopathy - Recommend regular full body skin exams - Recommend daily broad spectrum sunscreen SPF 30+ to sun-exposed areas, reapply every 2 hours as needed.  - Call if any new or changing lesions are noted between office visits  AK (actinic keratosis) (16) R mid cheek x 1, R sup lat forehead x 1, R temple x 1, R cutaneous lip near vermillion border x 1, nasal dorsum x 1, R antihelix x 1, R dorsal hand x 1, R forearm x 1, L forearm x 4, L eyebrow x 1, L temple x 1, L preauricular x  1, L antihelix x 1  Actinic keratoses are precancerous spots that appear secondary to cumulative UV radiation exposure/sun exposure over time. They are chronic with expected duration over 1 year. A portion of actinic keratoses will progress to squamous cell carcinoma of the skin. It is not possible to reliably predict which spots will progress to skin cancer and so treatment is recommended to prevent development of skin cancer.  Recommend  daily broad spectrum sunscreen SPF 30+ to sun-exposed areas, reapply every 2 hours as needed.  Recommend staying in the shade or wearing long sleeves, sun glasses (UVA+UVB protection) and wide brim hats (4-inch brim around the entire circumference of the hat). Call for new or changing lesions.   Destruction of lesion - R mid cheek x 1, R sup lat forehead x 1, R temple x 1, R cutaneous lip near vermillion border x 1, nasal dorsum x 1, R antihelix x 1, R dorsal hand x 1, R forearm x 1, L forearm x 4, L eyebrow x 1, L temple x 1, L preauricular x 1, L antihelix x 1 (16) Complexity: simple   Destruction method: cryotherapy   Informed consent: discussed and consent obtained   Timeout:  patient name, date of birth, surgical site, and procedure verified Lesion destroyed using liquid nitrogen: Yes   Region frozen until ice ball extended beyond lesion: Yes   Outcome: patient tolerated procedure well with no complications   Post-procedure details: wound care instructions given    Neoplasm of uncertain behavior of skin R lat thigh  Epidermal / dermal shaving  Lesion diameter (cm):  0.6 Informed consent: discussed and consent obtained   Timeout: patient name, date of birth, surgical site, and procedure verified   Procedure prep:  Patient was prepped and draped in usual sterile fashion Prep type:  Isopropyl alcohol Anesthesia: the lesion was anesthetized in a standard fashion   Anesthetic:  1% lidocaine w/ epinephrine 1-100,000 buffered w/ 8.4% NaHCO3 Instrument used: flexible razor blade   Hemostasis achieved with: pressure, aluminum chloride and electrodesiccation   Outcome: patient tolerated procedure well   Post-procedure details: sterile dressing applied and wound care instructions given   Dressing type: bandage and petrolatum    Specimen 1 - Surgical pathology Differential Diagnosis: D48.5 r/o MM Check Margins: Yes  Multiple benign nevi  Lentigines  Actinic elastosis  Seborrheic  keratoses  Cherry angioma  Inflamed seborrheic keratosis  Sebaceous hyperplasia   Return in about 6 months (around 01/02/2024) for TBSE.  Maylene Roes, CMA, am acting as scribe for Elie Goody, MD .  Documentation: I have reviewed the above documentation for accuracy and completeness, and I agree with the above.  Elie Goody, MD

## 2023-07-07 ENCOUNTER — Other Ambulatory Visit: Payer: Self-pay | Admitting: Family Medicine

## 2023-07-07 ENCOUNTER — Other Ambulatory Visit: Payer: Self-pay | Admitting: Family

## 2023-07-07 DIAGNOSIS — J309 Allergic rhinitis, unspecified: Secondary | ICD-10-CM

## 2023-07-08 LAB — SURGICAL PATHOLOGY

## 2023-07-12 ENCOUNTER — Telehealth: Payer: Self-pay

## 2023-07-12 NOTE — Telephone Encounter (Signed)
-----   Message from Anderson sent at 07/11/2023 11:29 AM EDT ----- Diagnosis: JUNCTIONAL DYSPLASTIC MELANOCYTIC NEVUS WITH MODERATE TO SEVERE ATYPIA, CLOSE TO MARGIN, SEE DESCRIPTION   Please call to share diagnosis and recommend excision.   Explanation: Your biopsy shows an atypical mole. It looks unusual enough that it cannot be distinguished from an early melanoma skin cancer. The safest course of action is to ensure it is fully removed with a surgery.  Treatment: you return for an hour long appointment where we perform a skin surgery. We numb the site and a safety margin of normal skin around it. We remove the full thickness of skin and close the wound with two layers of stitches. The sample is sent to the lab to check that it was fully removed. Return one week later to have the wound checked and surface stitches removed. Surgical wound leaves a line scar.

## 2023-07-13 NOTE — Telephone Encounter (Signed)
Patient advised and scheduled for surgery with Dr. Katrinka Blazing. aw

## 2023-07-21 ENCOUNTER — Telehealth: Payer: Self-pay | Admitting: Family Medicine

## 2023-07-21 ENCOUNTER — Other Ambulatory Visit: Payer: Self-pay

## 2023-07-21 DIAGNOSIS — E1165 Type 2 diabetes mellitus with hyperglycemia: Secondary | ICD-10-CM

## 2023-07-21 MED ORDER — METFORMIN HCL 1000 MG PO TABS: 1000.0000 mg | ORAL_TABLET | Freq: Every day | ORAL | 3 refills | Status: DC

## 2023-07-21 NOTE — Telephone Encounter (Signed)
Rx sent in

## 2023-07-21 NOTE — Telephone Encounter (Signed)
Patient needs refill on this medication metFORMIN (GLUCOPHAGE) 1000 MG tablet. The pharmacy he uses is [facility] - [place], [place] - [address], [place] [address] Phone: [phone]  Fax: [phone]  His number is [phone]

## 2023-07-27 ENCOUNTER — Other Ambulatory Visit: Payer: Self-pay

## 2023-07-27 DIAGNOSIS — E1165 Type 2 diabetes mellitus with hyperglycemia: Secondary | ICD-10-CM

## 2023-07-27 DIAGNOSIS — J309 Allergic rhinitis, unspecified: Secondary | ICD-10-CM

## 2023-07-27 MED ORDER — TRULICITY 0.75 MG/0.5ML ~~LOC~~ SOAJ
SUBCUTANEOUS | 11 refills | Status: DC
Start: 2023-07-27 — End: 2024-08-01

## 2023-07-27 MED ORDER — AZELASTINE HCL 0.1 % NA SOLN
1.0000 | Freq: Two times a day (BID) | NASAL | 3 refills | Status: DC
Start: 2023-07-27 — End: 2024-08-03

## 2023-07-27 NOTE — Telephone Encounter (Signed)
RX request sent to provider.

## 2023-08-04 ENCOUNTER — Ambulatory Visit: Payer: Medicare Other | Admitting: Dermatology

## 2023-08-04 ENCOUNTER — Encounter: Payer: Self-pay | Admitting: Dermatology

## 2023-08-04 VITALS — BP 137/79 | HR 59

## 2023-08-04 DIAGNOSIS — D2371 Other benign neoplasm of skin of right lower limb, including hip: Secondary | ICD-10-CM

## 2023-08-04 DIAGNOSIS — D239 Other benign neoplasm of skin, unspecified: Secondary | ICD-10-CM

## 2023-08-04 DIAGNOSIS — L988 Other specified disorders of the skin and subcutaneous tissue: Secondary | ICD-10-CM | POA: Diagnosis not present

## 2023-08-04 MED ORDER — MUPIROCIN 2 % EX OINT: 1.0000 | TOPICAL_OINTMENT | Freq: Every day | CUTANEOUS | 0 refills | Status: AC

## 2023-08-04 NOTE — Progress Notes (Signed)
   Follow-Up Visit   Subjective  Jackson [name] is a 72 y.o. male who presents for the following: Excision of right thigh   The following portions of the chart were reviewed this encounter and updated as appropriate: medications, allergies, medical history  Review of Systems:  No other skin or systemic complaints except as noted in HPI or Assessment and Plan.  Objective  Well appearing patient in no apparent distress; mood and affect are within normal limits.  A focused examination was performed of the following areas: Right thigh Relevant physical exam findings are noted in the Assessment and Plan.   right lateral thigh Healing bx site    Assessment & Plan   Dysplastic nevus right lateral thigh    Skin excision - right lateral thigh  Margin per side (cm):  0.5 Total excision diameter (cm):  2 Informed consent: discussed and consent obtained   Timeout: patient name, date of birth, surgical site, and procedure verified   Procedure prep:  Patient was prepped and draped in usual sterile fashion Prep type:  Chlorhexidine Anesthesia: the lesion was anesthetized in a standard fashion   Anesthetic:  1% lidocaine w/ epinephrine 1-100,000 buffered w/ 8.4% NaHCO3 (6 cc lido w/epi, 6 cc bupivicaine) Instrument used: #15 blade   Hemostasis achieved with: pressure   Outcome: patient tolerated procedure well with no complications   Additional details:  Tagged at superior   Skin repair - right lateral thigh Complexity:  Intermediate Final length (cm):  7.2 Informed consent: discussed and consent obtained   Timeout: patient name, date of birth, surgical site, and procedure verified   Procedure prep:  Patient was prepped and draped in usual sterile fashion Prep type:  Chlorhexidine Anesthesia: the lesion was anesthetized in a standard fashion   Anesthetic:  1% lidocaine w/ epinephrine 1-100,000 buffered w/ 8.4% NaHCO3 Reason for type of repair: reduce tension to allow closure,  reduce the risk of dehiscence, infection, and necrosis, reduce subcutaneous dead space and avoid a hematoma, allow closure of the large defect and preserve normal anatomy   Undermining: edges could be approximated without difficulty   Subcutaneous layers (deep stitches):  Suture size:  4-0 Suture type: Monocryl (poliglecaprone 25)   Stitches:  Buried vertical mattress Fine/surface layer approximation (top stitches):  Suture size:  5-0 Suture type: Prolene (polypropylene)   Stitches: simple running   Suture removal (days):  14 Hemostasis achieved with: suture, pressure and electrodesiccation Outcome: patient tolerated procedure well with no complications   Post-procedure details: sterile dressing applied and wound care instructions given   Dressing type: petrolatum, bandage and pressure dressing    Specimen 1 - Surgical pathology Differential Diagnosis: BX proven JUNCTIONAL DYSPLASTIC MELANOCYTIC NEVUS WITH MODERATE TO SEVERE ATYPIA  Check Margins: yes Healing bx site [id number]  Tagged superior     Return in about 2 weeks (around 08/18/2023) for suture removal.  Anise Salvo, RMA, am acting as scribe for Elie Goody, MD .   Documentation: I have reviewed the above documentation for accuracy and completeness, and I agree with the above.  Elie Goody, MD

## 2023-08-04 NOTE — Patient Instructions (Signed)

## 2023-08-06 LAB — SURGICAL PATHOLOGY

## 2023-08-10 ENCOUNTER — Telehealth: Payer: Self-pay

## 2023-08-10 NOTE — Telephone Encounter (Signed)
-----   Message from Tea sent at 08/06/2023  6:50 PM EDT ----- Diagnosis Skin (M), right lateral thigh :       EXCISION, NO RESIDUAL DYSPLASTIC NEVUS, MARGINS FREE    Please call to share that excision was clear of skin cancer and get update on surgical wound. Thank you.

## 2023-08-10 NOTE — Telephone Encounter (Signed)
Discussed pathology results. Patient voiced understanding. Reports surgical site is healing well, no issues. Keep schedule appointment for suture removal.

## 2023-08-19 ENCOUNTER — Ambulatory Visit: Payer: Medicare Other | Admitting: Dermatology

## 2023-08-19 ENCOUNTER — Encounter: Payer: Self-pay | Admitting: Dermatology

## 2023-08-19 DIAGNOSIS — Z4802 Encounter for removal of sutures: Secondary | ICD-10-CM

## 2023-08-19 NOTE — Patient Instructions (Signed)

## 2023-08-19 NOTE — Progress Notes (Signed)
   Follow-Up Visit   Subjective  Jackson [name] is a 72 y.o. male who presents for the following: Suture removal  Pathology showed no residual dysplastic nevus, margins free.   The following portions of the chart were reviewed this encounter and updated as appropriate: medications, allergies, medical history  Review of Systems:  No other skin or systemic complaints except as noted in HPI or Assessment and Plan.  Objective  Well appearing patient in no apparent distress; mood and affect are within normal limits.  Areas Examined: Right lateral thigh   Relevant physical exam findings are noted in the Assessment and Plan.    Assessment & Plan    Encounter for Removal of Sutures - Incision site is clean, dry and intact. - Wound cleansed, sutures removed, wound cleansed.  - Discussed pathology results showing no residual dysplastic nevus, margins free.  - Scars remodel for a full year. - Patient can apply over-the-counter silicone scar cream once to twice a day to help with scar remodeling if desired. - Patient advised to call with any concerns or if they notice any new or changing lesions.  Return for TBSE As Scheduled.  I, Lawson Radar, CMA, am acting as scribe for Elie Goody, MD.   Documentation: I have reviewed the above documentation for accuracy and completeness, and I agree with the above.  Elie Goody, MD

## 2023-08-30 ENCOUNTER — Other Ambulatory Visit (INDEPENDENT_AMBULATORY_CARE_PROVIDER_SITE_OTHER): Payer: Medicare Other

## 2023-08-30 DIAGNOSIS — E559 Vitamin D deficiency, unspecified: Secondary | ICD-10-CM | POA: Diagnosis not present

## 2023-08-30 DIAGNOSIS — E1159 Type 2 diabetes mellitus with other circulatory complications: Secondary | ICD-10-CM | POA: Diagnosis not present

## 2023-08-30 DIAGNOSIS — D509 Iron deficiency anemia, unspecified: Secondary | ICD-10-CM | POA: Diagnosis not present

## 2023-08-30 DIAGNOSIS — E785 Hyperlipidemia, unspecified: Secondary | ICD-10-CM

## 2023-08-30 DIAGNOSIS — R7989 Other specified abnormal findings of blood chemistry: Secondary | ICD-10-CM | POA: Diagnosis not present

## 2023-08-30 DIAGNOSIS — I152 Hypertension secondary to endocrine disorders: Secondary | ICD-10-CM

## 2023-08-30 DIAGNOSIS — E119 Type 2 diabetes mellitus without complications: Secondary | ICD-10-CM

## 2023-08-30 LAB — HEMOGLOBIN A1C: Hgb A1c MFr Bld: 6.8 % — ABNORMAL HIGH (ref 4.6–6.5)

## 2023-08-30 LAB — CBC WITH DIFFERENTIAL/PLATELET
Basophils Absolute: 0.1 10*3/uL (ref 0.0–0.1)
Basophils Relative: 0.6 % (ref 0.0–3.0)
Eosinophils Absolute: 0.2 10*3/uL (ref 0.0–0.7)
Eosinophils Relative: 2.4 % (ref 0.0–5.0)
HCT: 46.3 % (ref 39.0–52.0)
Hemoglobin: 15.4 g/dL (ref 13.0–17.0)
Lymphocytes Relative: 36.8 % (ref 12.0–46.0)
Lymphs Abs: 3.4 10*3/uL (ref 0.7–4.0)
MCHC: 33.3 g/dL (ref 30.0–36.0)
MCV: 92.6 fL (ref 78.0–100.0)
Monocytes Absolute: 0.6 10*3/uL (ref 0.1–1.0)
Monocytes Relative: 6.9 % (ref 3.0–12.0)
Neutro Abs: 5 10*3/uL (ref 1.4–7.7)
Neutrophils Relative %: 53.3 % (ref 43.0–77.0)
Platelets: 206 10*3/uL (ref 150.0–400.0)
RBC: 4.99 Mil/uL (ref 4.22–5.81)
RDW: 13.7 % (ref 11.5–15.5)
WBC: 9.4 10*3/uL (ref 4.0–10.5)

## 2023-08-30 LAB — LIPID PANEL
Cholesterol: 199 mg/dL (ref 0–200)
HDL: 31.2 mg/dL — ABNORMAL LOW (ref >39.00)
LDL Cholesterol: 143 mg/dL — ABNORMAL HIGH (ref 0–99)
NonHDL: 167.7
Total CHOL/HDL Ratio: 6
Triglycerides: 126 mg/dL (ref 0.0–149.0)
VLDL: 25.2 mg/dL (ref 0.0–40.0)

## 2023-08-30 LAB — COMPREHENSIVE METABOLIC PANEL
ALT: 36 U/L (ref 0–53)
AST: 24 U/L (ref 0–37)
Albumin: 4.3 g/dL (ref 3.5–5.2)
Alkaline Phosphatase: 42 U/L (ref 39–117)
BUN: 18 mg/dL (ref 6–23)
CO2: 30 meq/L (ref 19–32)
Calcium: 9.8 mg/dL (ref 8.4–10.5)
Chloride: 103 meq/L (ref 96–112)
Creatinine, Ser: 1.31 mg/dL (ref 0.40–1.50)
GFR: 54.94 mL/min — ABNORMAL LOW (ref >60.00)
Glucose, Bld: 105 mg/dL — ABNORMAL HIGH (ref 70–99)
Potassium: 4.4 meq/L (ref 3.5–5.1)
Sodium: 141 meq/L (ref 135–145)
Total Bilirubin: 0.6 mg/dL (ref 0.2–1.2)
Total Protein: 6.9 g/dL (ref 6.0–8.3)

## 2023-08-30 LAB — VITAMIN B12: Vitamin B-12: 1234 pg/mL — ABNORMAL HIGH (ref 211–911)

## 2023-08-30 LAB — VITAMIN D 25 HYDROXY (VIT D DEFICIENCY, FRACTURES): VITD: 69.48 ng/mL (ref 30.00–100.00)

## 2023-08-31 LAB — IRON,TIBC AND FERRITIN PANEL
%SAT: 16 % — ABNORMAL LOW (ref 20–48)
Ferritin: 137 ng/mL (ref 24–380)
Iron: 59 ug/dL (ref 50–180)
TIBC: 360 ug/dL (ref 250–425)

## 2023-09-04 ENCOUNTER — Encounter: Payer: Self-pay | Admitting: Family Medicine

## 2023-09-13 ENCOUNTER — Encounter: Payer: Medicare Other | Admitting: Family Medicine

## 2023-09-14 ENCOUNTER — Ambulatory Visit (INDEPENDENT_AMBULATORY_CARE_PROVIDER_SITE_OTHER): Payer: Medicare Other | Admitting: Family Medicine

## 2023-09-14 VITALS — BP 120/62 | HR 59 | Temp 98.2°F | Resp 16 | Ht 73.0 in | Wt 237.8 lb

## 2023-09-14 DIAGNOSIS — Z1211 Encounter for screening for malignant neoplasm of colon: Secondary | ICD-10-CM

## 2023-09-14 DIAGNOSIS — I152 Hypertension secondary to endocrine disorders: Secondary | ICD-10-CM | POA: Diagnosis not present

## 2023-09-14 DIAGNOSIS — E1159 Type 2 diabetes mellitus with other circulatory complications: Secondary | ICD-10-CM

## 2023-09-14 DIAGNOSIS — E785 Hyperlipidemia, unspecified: Secondary | ICD-10-CM | POA: Diagnosis not present

## 2023-09-14 DIAGNOSIS — E1169 Type 2 diabetes mellitus with other specified complication: Secondary | ICD-10-CM

## 2023-09-14 DIAGNOSIS — Z0000 Encounter for general adult medical examination without abnormal findings: Secondary | ICD-10-CM

## 2023-09-14 DIAGNOSIS — T148XXA Other injury of unspecified body region, initial encounter: Secondary | ICD-10-CM

## 2023-09-14 DIAGNOSIS — N400 Enlarged prostate without lower urinary tract symptoms: Secondary | ICD-10-CM | POA: Diagnosis not present

## 2023-09-14 DIAGNOSIS — E119 Type 2 diabetes mellitus without complications: Secondary | ICD-10-CM

## 2023-09-14 DIAGNOSIS — G720 Drug-induced myopathy: Secondary | ICD-10-CM

## 2023-09-14 DIAGNOSIS — T466X5A Adverse effect of antihyperlipidemic and antiarteriosclerotic drugs, initial encounter: Secondary | ICD-10-CM | POA: Diagnosis not present

## 2023-09-14 MED ORDER — BACLOFEN 5 MG PO TABS: 5.0000 mg | ORAL_TABLET | Freq: Every evening | ORAL | 0 refills | Status: DC | PRN

## 2023-09-14 MED ORDER — PREDNISONE 20 MG PO TABS: 20.0000 mg | ORAL_TABLET | Freq: Every day | ORAL | 0 refills | Status: AC

## 2023-09-14 NOTE — Patient Instructions (Addendum)
It was a pleasure meeting you today. Thank you for allowing me to take part in your health care.  Our goals for today as we discussed include:  Take Prednisone 20 mg daily for 5 days Take Baclofen 5 mg at night Tylenol 500 mg every 6 hours as needed Lidocaine patch every 12 hours as needed Diclofenac gel every 6 hours as needed Heat/Ice as needed Massage therapy Gentle neck stretching daily Can refer to physical therapy if no improvement   Cologuard has been placed  This is a list of the screening recommended for you and due dates:  Health Maintenance  Topic Date Due   Zoster (Shingles) Vaccine (1 of 2) Never done   Yearly kidney health urinalysis for diabetes  04/28/2023   COVID-19 Vaccine (4 - 2023-24 season) 06/27/2023   Complete foot exam   09/05/2023   Flu Shot  01/24/2024*   Eye exam for diabetics  12/24/2023   Medicare Annual Wellness Visit  02/05/2024   Hemoglobin A1C  02/27/2024   Yearly kidney function blood test for diabetes  08/29/2024   Colon Cancer Screening  10/26/2025   DTaP/Tdap/Td vaccine (2 - Td or Tdap) 03/21/2028   Pneumonia Vaccine  Completed   HPV Vaccine  Aged Out  *Topic was postponed. The date shown is not the original due date.     If you have any questions or concerns, please do not hesitate to call the office at [phone].  I look forward to our next visit and until then take care and stay safe.  Regards,   Dana Allan, MD   [facility]

## 2023-09-14 NOTE — Progress Notes (Signed)
SUBJECTIVE:   Chief Complaint  Patient presents with   Annual Exam   HPI Presents to clinic for annual physical  Discussed the use of AI scribe software for clinical note transcription with the patient, who gave verbal consent to proceed.  History of Present Illness The pain began approximately 10 days ago following a sneeze, and is localized to the trapezius muscle. The patient describes the pain as aching, and it is exacerbated by turning the neck to the left. The pain has been severe enough to disrupt sleep and cause stomach discomfort. The patient has attempted to manage the pain with Voltaren and ibuprofen, which have provided some relief. The patient also reports using heat and ice packs, with heat providing more relief.  The patient's diabetes is managed with Trulicity, and they have been monitoring their blood sugar levels. They recently made a dietary change, switching from cereal to eggs for breakfast, in an effort to reduce sugar intake and potentially impact their A1c levels.  They are under the care of a urologist and have an upcoming appointment.  The patient's hypertension is well-controlled, and they report no issues with their current medication regimen. They have not started any new medications recently.  The patient is due for a colonoscopy and reports no issues with bowel movements or blood in the stool. They have regular eye and ear check-ups, with the most recent ones being in February and August, respectively.  The patient's cholesterol management has been a topic of discussion, with the patient reporting that they have been cutting back on carbohydrates to manage their cholesterol levels. They have declined the use of Zetia, stating it does not have any effect. They have also discussed the use of Repatha with their cardiologist, who did not think it was warranted due to the absence of heart blockage or arterial sclerosis.  The patient has not yet decided whether to  receive the flu vaccine this year, citing dissatisfaction with the medical establishment's handling of the COVID-19 pandemic.   Review of Systems - Negative except above     PERTINENT PMH / PSH: As above  OBJECTIVE:  BP 120/62   Pulse (!) 59   Temp 98.2 F (36.8 C)   Resp 16   Ht 6\' 1"  (1.854 m)   Wt 237 lb 12 oz (107.8 kg)   SpO2 97%   BMI 31.37 kg/m    Physical Exam Vitals reviewed.  Constitutional:      Appearance: He is obese.  HENT:     Head: Normocephalic.     Right Ear: Tympanic membrane, ear canal and external ear normal.     Left Ear: Tympanic membrane, ear canal and external ear normal.     Nose: Nose normal.     Mouth/Throat:     Mouth: Mucous membranes are moist.  Eyes:     Conjunctiva/sclera: Conjunctivae normal.     Pupils: Pupils are equal, round, and reactive to light.  Neck:     Thyroid: No thyromegaly or thyroid tenderness.     Vascular: No carotid bruit.  Cardiovascular:     Rate and Rhythm: Normal rate and regular rhythm.     Pulses: Normal pulses.     Heart sounds: Normal heart sounds.  Pulmonary:     Effort: Pulmonary effort is normal.     Breath sounds: Normal breath sounds.  Abdominal:     General: Abdomen is flat. Bowel sounds are normal.     Palpations: Abdomen is soft.  Musculoskeletal:  General: Tenderness present. Normal range of motion.     Right shoulder: Normal.     Left shoulder: Normal.     Cervical back: Normal range of motion and neck supple. Spasms and tenderness present. No swelling, edema, erythema, signs of trauma, rigidity, torticollis or bony tenderness. Normal range of motion.     Thoracic back: Normal.     Right lower leg: No edema.     Left lower leg: No edema.  Lymphadenopathy:     Cervical: No cervical adenopathy.  Neurological:     Mental Status: He is alert.  Psychiatric:        Mood and Affect: Mood normal.        Behavior: Behavior normal.        Thought Content: Thought content normal.         Judgment: Judgment normal.        09/14/2023   11:15 AM 02/05/2023   10:50 AM 09/09/2022    2:20 PM 08/10/2022    1:57 PM 04/24/2022    2:35 PM  Depression screen PHQ 2/9  Decreased Interest 0 0 0 0 0  Down, Depressed, Hopeless 0 0 0 0 0  PHQ - 2 Score 0 0 0 0 0  Altered sleeping 2      Tired, decreased energy 2      Change in appetite 0      Feeling bad or failure about yourself  0      Trouble concentrating 0      Moving slowly or fidgety/restless 0      Suicidal thoughts 0      PHQ-9 Score 4      Difficult doing work/chores Not difficult at all          09/14/2023   11:16 AM 03/19/2020    3:09 PM  GAD 7 : Generalized Anxiety Score  Nervous, Anxious, on Edge 0 0  Control/stop worrying 0 0  Worry too much - different things 0 0  Trouble relaxing 1 0  Restless 0 0  Easily annoyed or irritable 0 0  Afraid - awful might happen 0 0  Total GAD 7 Score 1 0  Anxiety Difficulty Not difficult at all Not difficult at all    ASSESSMENT/PLAN:  Annual physical exam Assessment & Plan: -Order Cologuard for colorectal cancer screening. -Declined flu vaccine and notify if received outside of office. -Recommend Shingles vaccination -Follow up with audiology and ophthalmology as scheduled. -Reviewed recent labs and recommendations -Foot exam at next visit     Muscle strain Assessment & Plan: Acute onset following a sneeze 10 days ago, localized to the trapezius muscle, exacerbated by CPAP use and causing sleep disturbance. No associated numbness, tingling, or weakness. Partial relief with ibuprofen and Voltaren. -Start a short course of oral steroids (20mg  for 4-5 days) to reduce inflammation. -Start Baclofen at night for muscle relaxation. -Monitor for improvement and report if no relief.  Orders: -     Baclofen; Take 1 tablet (5 mg total) by mouth at bedtime as needed for muscle spasms.  Dispense: 30 tablet; Refill: 0 -     predniSONE; Take 1 tablet (20 mg total) by mouth  daily with breakfast for 5 days.  Dispense: 5 tablet; Refill: 0  Colon cancer screening -     Cologuard  Type 2 diabetes mellitus without complication, without long-term current use of insulin (HCC) Assessment & Plan: Stable with recent A1c within target range. Patient is monitoring blood sugars at  home and has made dietary changes. -Continue current management. -Monitor blood sugars during steroid course and notify if sugars rise above 200-250. -Follow up in 3 months to assess impact of dietary changes on A1c.  Orders: -     Microalbumin / creatinine urine ratio; Future  Hyperlipidemia associated with type 2 diabetes mellitus (HCC) Assessment & Plan: Patient unable to tolerate statins, has tried Zetia without effect, and is not a candidate for Repatha per cardiology. Patient is making dietary changes to reduce carbohydrate intake. -Continue current management.   Statin myopathy Assessment & Plan: Tried multiple statins in past all inducing myalgia. Not interested in restarting    Benign prostatic hyperplasia without lower urinary tract symptoms Assessment & Plan: Patient on Flomax, occasional urgency noted. -Continue current management and follow up with urology.   Hypertension associated with diabetes [facility]) Assessment & Plan: Managed on current medication. Currently on metoprolol 50 mg daily for previous history of SVT that remained stable     PDMP reviewed  Return in about 3 months (around 12/15/2023) for PCP, dm.  Dana Allan, MD

## 2023-09-17 ENCOUNTER — Other Ambulatory Visit: Payer: Self-pay | Admitting: Urology

## 2023-09-21 DIAGNOSIS — Z1211 Encounter for screening for malignant neoplasm of colon: Secondary | ICD-10-CM | POA: Diagnosis not present

## 2023-09-23 ENCOUNTER — Encounter: Payer: Self-pay | Admitting: Family Medicine

## 2023-09-23 DIAGNOSIS — Z0000 Encounter for general adult medical examination without abnormal findings: Secondary | ICD-10-CM | POA: Insufficient documentation

## 2023-09-23 DIAGNOSIS — T148XXA Other injury of unspecified body region, initial encounter: Secondary | ICD-10-CM | POA: Insufficient documentation

## 2023-09-23 DIAGNOSIS — E119 Type 2 diabetes mellitus without complications: Secondary | ICD-10-CM | POA: Insufficient documentation

## 2023-09-23 DIAGNOSIS — Z1211 Encounter for screening for malignant neoplasm of colon: Secondary | ICD-10-CM | POA: Insufficient documentation

## 2023-09-23 NOTE — Assessment & Plan Note (Signed)
-  Order Cologuard for colorectal cancer screening. -Declined flu vaccine and notify if received outside of office. -Recommend Shingles vaccination -Follow up with audiology and ophthalmology as scheduled. -Reviewed recent labs and recommendations -Foot exam at next visit

## 2023-09-23 NOTE — Assessment & Plan Note (Signed)
Acute onset following a sneeze 10 days ago, localized to the trapezius muscle, exacerbated by CPAP use and causing sleep disturbance. No associated numbness, tingling, or weakness. Partial relief with ibuprofen and Voltaren. -Start a short course of oral steroids (20mg  for 4-5 days) to reduce inflammation. -Start Baclofen at night for muscle relaxation. -Monitor for improvement and report if no relief.

## 2023-09-23 NOTE — Assessment & Plan Note (Signed)
Managed on current medication. Currently on metoprolol 50 mg daily for previous history of SVT that remained stable

## 2023-09-23 NOTE — Assessment & Plan Note (Signed)
Tried multiple statins in past all inducing myalgia. Not interested in restarting

## 2023-09-23 NOTE — Assessment & Plan Note (Signed)
Stable with recent A1c within target range. Patient is monitoring blood sugars at home and has made dietary changes. -Continue current management. -Monitor blood sugars during steroid course and notify if sugars rise above 200-250. -Follow up in 3 months to assess impact of dietary changes on A1c.

## 2023-09-23 NOTE — Assessment & Plan Note (Signed)
Patient unable to tolerate statins, has tried Zetia without effect, and is not a candidate for Repatha per cardiology. Patient is making dietary changes to reduce carbohydrate intake. -Continue current management.

## 2023-09-23 NOTE — Assessment & Plan Note (Signed)
Patient on Flomax, occasional urgency noted. -Continue current management and follow up with urology.

## 2023-09-29 ENCOUNTER — Ambulatory Visit: Payer: Medicare Other | Admitting: Urology

## 2023-09-29 ENCOUNTER — Encounter: Payer: Self-pay | Admitting: Family Medicine

## 2023-09-29 LAB — COLOGUARD: COLOGUARD: NEGATIVE

## 2023-09-30 ENCOUNTER — Encounter: Payer: Self-pay | Admitting: Urology

## 2023-09-30 ENCOUNTER — Ambulatory Visit: Payer: Medicare Other | Admitting: Urology

## 2023-09-30 VITALS — BP 149/74 | HR 76 | Ht 73.0 in | Wt 228.0 lb

## 2023-09-30 DIAGNOSIS — N1831 Chronic kidney disease, stage 3a: Secondary | ICD-10-CM | POA: Diagnosis not present

## 2023-09-30 DIAGNOSIS — N400 Enlarged prostate without lower urinary tract symptoms: Secondary | ICD-10-CM

## 2023-09-30 DIAGNOSIS — N401 Enlarged prostate with lower urinary tract symptoms: Secondary | ICD-10-CM

## 2023-09-30 LAB — BLADDER SCAN AMB NON-IMAGING

## 2023-09-30 MED ORDER — FINASTERIDE 5 MG PO TABS: 5.0000 mg | ORAL_TABLET | Freq: Every day | ORAL | 3 refills | Status: DC

## 2023-09-30 MED ORDER — TAMSULOSIN HCL 0.4 MG PO CAPS: 0.4000 mg | ORAL_CAPSULE | Freq: Every day | ORAL | 3 refills | Status: DC

## 2023-09-30 NOTE — Progress Notes (Signed)
   09/30/2023 2:30 PM   Jackson [name] 1951/09/09 [id number]  Reason for visit: Follow up BPH/LUTS, incomplete bladder emptying, history of elevated PSA  HPI: 72 year old male with a long history of incomplete bladder emptying with PVRs ranging from 151ml-400ml.  He had some improvement on Flomax, but deferred any consideration of outlet procedures and has opted for close monitoring.  Prostate measured 80 g on ultrasound.  At our visit in December 2023 he was amenable to adding finasteride.  He also has a history of mildly elevated PSA that normalized on recheck, last PSA was 3.5 in July 2023, stable from prior with reassuring PSA density, and he opted to discontinue the PSA screening per the guideline recommendations.  He denies any significant improvement on the finasteride.  Continues to have some weak stream, occasional urgency, nocturia 1 time overnight.  Overall he is minimally bothered by his urinary symptoms.  PVR remains significantly elevated to today.  Recent renal function slightly worse with creatinine 1.31, EGFR 55.  He remains opposed to considering any outlet procedures.  We discussed the differences between HOLEP and PAE.  I recommended renal/bladder ultrasound to evaluate for hydronephrosis, if there is hydronephrosis contributing to his CKD he would be willing to consider an outlet procedure. We discussed the risks and benefits of HoLEP at length.  The procedure requires general anesthesia and takes 1 to 2 hours, and a holmium laser is used to enucleate the prostate and push this tissue into the bladder.  A morcellator is then used to remove this tissue, which is sent for pathology.  The vast majority(>95%) of patients are able to discharge the same day with a catheter in place for 2 to 3 days, and will follow-up in clinic for a voiding trial.  We specifically discussed the risks of bleeding, infection, retrograde ejaculation, temporary urgency and urge incontinence, very low  risk of long-term incontinence, urethral stricture/bladder neck contracture, pathologic evaluation of prostate tissue and possible detection of prostate cancer or other malignancy, and possible need for additional procedures.  -Renal/bladder ultrasound to evaluate hydronephrosis, call with results.  If hydronephrosis present would again strongly recommend HOLEP or PAE -Flomax and finasteride refilled   Sondra Come, MD  [facility] [address], [address] [place], [place] [address] [phone]

## 2023-09-30 NOTE — Patient Instructions (Signed)
You have an enlarged prostate causing blockage and incomplete bladder emptying.  We need to make sure you are not having back pressure up to your kidneys which can cause worsening kidney function or permanent kidney damage.  We will contact you with the ultrasound results.  Options for relieving prostate blockage are either HOLEP or PAE.  See information about HOLEP below. PAE stands for prostate artery embolization and is done by a radiologist to block the blood vessels to the prostate and cause prostate shrinkage to improve urination.  Holmium Laser Enucleation of the Prostate (HoLEP)  HoLEP is a treatment for men with benign prostatic hyperplasia (BPH). The laser surgery removed blockages of urine flow, and is done without any incisions on the body.     What is HoLEP?  HoLEP is a type of laser surgery used to treat obstruction (blockage) of urine flow as a result of benign prostatic hyperplasia (BPH). In men with BPH, the prostate gland is not cancerous, but has become enlarged. An enlarged prostate can result in a number of urinary tract symptoms such as weak urinary stream, difficulty in starting urination, inability to urinate, frequent urination, or getting up at night to urinate.  HoLEP was developed in the 1990's as a more effective and less expensive surgical option for BPH, compared to other surgical options such as laser vaporization(PVP/greenlight laser), transurethral resection of the prostate(TURP), and open simple prostatectomy.   What happens during a HoLEP?  HoLEP requires general anesthesia ("asleep" throughout the procedure).   An antibiotic is given to reduce the risk of infection  A surgical instrument called a resectoscope is inserted through the urethra (the tube that carries urine from the bladder). The resectoscope has a camera that allows the surgeon to view the internal structure of the prostate gland, and to see where the incisions are being made during surgery.   The laser is inserted into the resectoscope and is used to enucleate (free up) the enlarged prostate tissue from the capsule (outer shell) and then to seal up any blood vessels. The tissue that has been removed is pushed back into the bladder.  A morcellator is placed through the resectoscope, and is used to suction out the prostate tissue that has been pushed into the bladder.  When the prostate tissue has been removed, the resectoscope is removed, and a foley catheter is placed to allow healing and drain the urine from the bladder.     What happens after a HoLEP?  More than 95% of patients go home the same day a few hours after surgery. Less than 5% will be admitted to the hospital overnight for observation to monitor the urine, or if they have other medical problems.  Fluid is flushed through the catheter for about 1 hour after surgery to clear any blood from the urine. It is normal to have some blood in the urine after surgery. The need for blood transfusion is extremely rare.  Eating and drinking are permitted after the procedure once the patient has fully awakened from anesthesia.  The catheter is usually removed 2-3 days after surgery- the patient will come to clinic to have the catheter removed and make sure they can urinate on their own.  It is very important to drink lots of fluids after surgery for one week to keep the bladder flushed.  At first, there may be some burning with urination, but this typically improved within a few hours to days. Most patients do not have a significant amount of  pain, and narcotic pain medications are rarely needed.  Symptoms of urinary frequency, urgency, and even leakage are NORMAL for the first few weeks after surgery as the bladder adjusts after having to work hard against blockage from the prostate for many years. This will improve, but can sometimes take several months.  The use of pelvic floor exercises (Kegel exercises) can help improve  problems with urinary incontinence.   After catheter removal, patients will be seen at 12 weeks and 6 months for symptom check  No heavy lifting for at least 2-3 weeks after surgery, however patients can walk and do light activities the first day after surgery. Return to work time depends on occupation.    What are the advantages of HoLEP?  HoLEP has been studied in many different parts of the world and has been shown to be a safe and effective procedure. Although there are many types of BPH surgeries available, HoLEP offers a unique advantage in being able to remove a large amount of tissue without any incisions on the body, even in very large prostates, while decreasing the risk of bleeding and providing tissue for pathology (to look for cancer). This decreases the need for blood transfusions during surgery, minimizes hospital stay, and reduces the risk of needing repeat treatment.  What are the side effects of HoLEP?  Temporary burning and bleeding during urination. Some blood may be seen in the urine for weeks after surgery and is part of the healing process.  Urinary incontinence (inability to control urine flow) is expected in all patients immediately after surgery and they should wear pads for the first few days/weeks. This typically improves over the course of several weeks. Performing Kegel exercises can help decrease leakage from stress maneuvers such as coughing, sneezing, or lifting. The rate of long term leakage is very low, 1-2%. Patients may also have leakage with urgency and this may be treated with medication. The risk of urge incontinence can be dependent on several factors including age, prostate size, symptoms, and other medical problems.  Retrograde ejaculation or "backwards ejaculation." In 80% of cases, the patient will not see any fluid during ejaculation after surgery.  Erectile function is generally not significantly affected.   What are the risks of HoLEP?  Injury  to the urethra or development of scar tissue at a later date  Injury to the capsule of the prostate (typically treated with longer catheterization).  Injury to the bladder or ureteral orifices (where the urine from the kidney drains out)  Infection of the bladder, testes, or kidneys (~4%)  Return of urinary obstruction at a later date requiring another operation (<2%)  Need for blood transfusion or re-operation due to bleeding  Failure to relieve all symptoms and/or need for prolonged catheterization after surgery  5-15% of patients are found to have previously undiagnosed prostate cancer in their specimen. Prostate cancer can be treated after HoLEP.  Standard risks of anesthesia including blood clots, heart attacks, etc ~1-2% risk of long term urinary incontinence (leakage)  When should I call my doctor?  Fever over 101.3 degrees  Inability to urinate, or large blood clots in the urine

## 2023-10-06 ENCOUNTER — Ambulatory Visit: Payer: Medicare Other | Admitting: Urology

## 2023-10-08 ENCOUNTER — Ambulatory Visit
Admission: RE | Admit: 2023-10-08 | Discharge: 2023-10-08 | Disposition: A | Discharge status: Home / Self Care | Payer: Medicare Other | Source: Ambulatory Visit | Attending: Urology

## 2023-10-08 DIAGNOSIS — N1831 Chronic kidney disease, stage 3a: Secondary | ICD-10-CM | POA: Diagnosis not present

## 2023-10-08 DIAGNOSIS — N2889 Other specified disorders of kidney and ureter: Secondary | ICD-10-CM | POA: Diagnosis not present

## 2023-10-08 DIAGNOSIS — N400 Enlarged prostate without lower urinary tract symptoms: Secondary | ICD-10-CM | POA: Diagnosis not present

## 2023-10-08 DIAGNOSIS — N401 Enlarged prostate with lower urinary tract symptoms: Secondary | ICD-10-CM | POA: Diagnosis not present

## 2023-10-08 DIAGNOSIS — R39198 Other difficulties with micturition: Secondary | ICD-10-CM | POA: Diagnosis not present

## 2023-10-11 ENCOUNTER — Encounter: Payer: Self-pay | Admitting: Family Medicine

## 2023-10-11 ENCOUNTER — Ambulatory Visit (INDEPENDENT_AMBULATORY_CARE_PROVIDER_SITE_OTHER): Payer: Medicare Other | Admitting: Family Medicine

## 2023-10-11 ENCOUNTER — Other Ambulatory Visit
Admission: RE | Admit: 2023-10-11 | Discharge: 2023-10-11 | Disposition: A | Discharge status: Home / Self Care | Payer: Medicare Other | Source: Ambulatory Visit | Attending: Family Medicine | Admitting: Family Medicine

## 2023-10-11 ENCOUNTER — Telehealth: Payer: Self-pay | Admitting: Family Medicine

## 2023-10-11 VITALS — BP 114/62 | HR 59 | Temp 97.9°F | Resp 18 | Ht 73.0 in | Wt 243.0 lb

## 2023-10-11 DIAGNOSIS — R079 Chest pain, unspecified: Secondary | ICD-10-CM

## 2023-10-11 DIAGNOSIS — R42 Dizziness and giddiness: Secondary | ICD-10-CM | POA: Diagnosis not present

## 2023-10-11 DIAGNOSIS — R0789 Other chest pain: Secondary | ICD-10-CM

## 2023-10-11 LAB — COMPREHENSIVE METABOLIC PANEL
ALT: 38 U/L (ref 0–44)
AST: 25 U/L (ref 15–41)
Albumin: 4.2 g/dL (ref 3.5–5.0)
Alkaline Phosphatase: 37 U/L — ABNORMAL LOW (ref 38–126)
Anion gap: 8 (ref 5–15)
BUN: 16 mg/dL (ref 8–23)
CO2: 26 mmol/L (ref 22–32)
Calcium: 8.8 mg/dL — ABNORMAL LOW (ref 8.9–10.3)
Chloride: 102 mmol/L (ref 98–111)
Creatinine, Ser: 1.18 mg/dL (ref 0.61–1.24)
GFR, Estimated: >60 mL/min (ref >60)
Glucose, Bld: 105 mg/dL — ABNORMAL HIGH (ref 70–99)
Potassium: 3.9 mmol/L (ref 3.5–5.1)
Sodium: 136 mmol/L (ref 135–145)
Total Bilirubin: 0.6 mg/dL (ref <1.2)
Total Protein: 7.1 g/dL (ref 6.5–8.1)

## 2023-10-11 LAB — CBC WITH DIFFERENTIAL/PLATELET
Abs Immature Granulocytes: 0.04 10*3/uL (ref 0.00–0.07)
Basophils Absolute: 0 10*3/uL (ref 0.0–0.1)
Basophils Relative: 1 %
Eosinophils Absolute: 0.2 10*3/uL (ref 0.0–0.5)
Eosinophils Relative: 2 %
HCT: 44.2 % (ref 39.0–52.0)
Hemoglobin: 15.3 g/dL (ref 13.0–17.0)
Immature Granulocytes: 1 %
Lymphocytes Relative: 32 %
Lymphs Abs: 2.7 10*3/uL (ref 0.7–4.0)
MCH: 30.8 pg (ref 26.0–34.0)
MCHC: 34.6 g/dL (ref 30.0–36.0)
MCV: 89.1 fL (ref 80.0–100.0)
Monocytes Absolute: 0.5 10*3/uL (ref 0.1–1.0)
Monocytes Relative: 6 %
Neutro Abs: 4.8 10*3/uL (ref 1.7–7.7)
Neutrophils Relative %: 58 %
Platelets: 218 10*3/uL (ref 150–400)
RBC: 4.96 MIL/uL (ref 4.22–5.81)
RDW: 13.4 % (ref 11.5–15.5)
WBC: 8.3 10*3/uL (ref 4.0–10.5)
nRBC: 0 % (ref 0.0–0.2)

## 2023-10-11 LAB — TROPONIN I (HIGH SENSITIVITY): Troponin I (High Sensitivity): 4 ng/L (ref <18)

## 2023-10-11 NOTE — Telephone Encounter (Signed)
Patient scheduled appointment through MyChart for 10/12/2023, chest pain.

## 2023-10-11 NOTE — Patient Instructions (Signed)
It was a pleasure meeting you today. Thank you for allowing me to take part in your health care.  Our goals for today as we discussed include:  Go to [place] [facility] to have xray and blood work  Will reach out to cardiologist for recommendations  Please call Dr Ethelene Hal office in the morning   If worsening chest pain follow up in emergency department  Follow up with PCP as needed   If you have any questions or concerns, please do not hesitate to call the office at [phone].  I look forward to our next visit and until then take care and stay safe.  Regards,   Dana Allan, MD   [facility]

## 2023-10-11 NOTE — Progress Notes (Signed)
SUBJECTIVE:   Chief Complaint  Patient presents with   Chest Pain    X 2 weeks all the time. More so when he wakes up    HPI Presents to clinic for acute visit  Discussed the use of AI scribe software for clinical note transcription with the patient, who gave verbal consent to proceed.  History of Present Illness The patient, with a history of rheumatic fever in childhood and currently on Flomax and Finasteride, presents with a two-week history of constant chest pain. The pain, described as a dull ache, is localized to the chest and does not radiate. The patient compares the sensation to a pulled muscle and denies any associated shortness of breath or tightness. The pain is reportedly worse in the morning and does not seem to be relieved or exacerbated by any specific factors.  In addition to the chest pain, the patient reports feeling tired and experiencing dizziness, particularly upon standing up in the morning. The patient denies any recent changes in medication, and all current medications have been stable for at least a year.  The patient denies any recent trauma or injury to the chest area but does mention a history of separated ribs and chondrocostitis. The patient has tried taking Advil for a separate neck issue, which seemed to lessen the overall discomfort but did not specifically target the chest pain.  The patient denies any changes in appetite or drinking habits and reports no abdominal pain. The chest pain is described as a constant ache that varies in intensity but is present all the time. The patient denies any palpitations or feelings of the heart racing.    PERTINENT PMH / PSH: As above  OBJECTIVE:  BP 114/62   Pulse (!) 59   Temp 97.9 F (36.6 C)   Resp 18   Ht 6\' 1"  (1.854 m)   Wt 243 lb (110.2 kg)   SpO2 98%   BMI 32.06 kg/m    Physical Exam Vitals reviewed.  Constitutional:      General: He is not in acute distress.    Appearance: Normal appearance.  He is normal weight. He is not ill-appearing, toxic-appearing or diaphoretic.  Eyes:     General:        Right eye: No discharge.        Left eye: No discharge.  Cardiovascular:     Rate and Rhythm: Regular rhythm. Bradycardia present.     Heart sounds: Normal heart sounds. Heart sounds not distant. No murmur heard. Pulmonary:     Effort: Pulmonary effort is normal.     Breath sounds: Normal breath sounds.  Chest:     Chest wall: No tenderness.  Abdominal:     General: Bowel sounds are normal.  Musculoskeletal:        General: Normal range of motion.     Cervical back: Normal range of motion.  Skin:    General: Skin is warm and dry.  Neurological:     Mental Status: He is alert and oriented to person, place, and time. Mental status is at baseline.  Psychiatric:        Mood and Affect: Mood normal.        Behavior: Behavior normal.        Thought Content: Thought content normal.        Judgment: Judgment normal.        09/14/2023   11:15 AM 02/05/2023   10:50 AM 09/09/2022    2:20 PM 08/10/2022  1:57 PM 04/24/2022    2:35 PM  Depression screen PHQ 2/9  Decreased Interest 0 0 0 0 0  Down, Depressed, Hopeless 0 0 0 0 0  PHQ - 2 Score 0 0 0 0 0  Altered sleeping 2      Tired, decreased energy 2      Change in appetite 0      Feeling bad or failure about yourself  0      Trouble concentrating 0      Moving slowly or fidgety/restless 0      Suicidal thoughts 0      PHQ-9 Score 4      Difficult doing work/chores Not difficult at all          09/14/2023   11:16 AM 03/19/2020    3:09 PM  GAD 7 : Generalized Anxiety Score  Nervous, Anxious, on Edge 0 0  Control/stop worrying 0 0  Worry too much - different things 0 0  Trouble relaxing 1 0  Restless 0 0  Easily annoyed or irritable 0 0  Afraid - awful might happen 0 0  Total GAD 7 Score 1 0  Anxiety Difficulty Not difficult at all Not difficult at all    ASSESSMENT/PLAN:  Atypical chest pain Assessment &  Plan: Constant, dull ache in the chest for two weeks, worse in the morning. No radiation of pain. EKG does not show any acute changes. Given the nature of the pain and the patient's history, there is a concern for possible cardiac etiology. -Recommend Troponin, CBC, CMET stat -Stat chest xray -EKG: EKG, unchanged from previous tracings, sinus bradycardia.  -Plan for cardiology follow up, have reached out to Dr Mariah Milling for recommendations    Addendum: No changes on EKG Troponins negative No further cardiac work up indicated Currently on PPI If continues to have pain follow up for further evaluation.  Orders: -     EKG 12-Lead -     DG Chest 2 View; Future -     CBC with Differential/Platelet; Future -     Comprehensive metabolic panel; Future -     Troponin I (High Sensitivity); Future  Vertigo Assessment & Plan: Reports of dizziness, particularly upon standing. No focal deficits on neuro exam -Orthostatics normal -Increase fluids     PDMP reviewed  Return if symptoms worsen or fail to improve, for PCP.  Dana Allan, MD

## 2023-10-12 ENCOUNTER — Ambulatory Visit: Payer: Medicare Other | Admitting: Family Medicine

## 2023-10-12 ENCOUNTER — Encounter: Payer: Self-pay | Admitting: Family Medicine

## 2023-10-12 ENCOUNTER — Encounter: Payer: Self-pay | Admitting: Cardiovascular Disease

## 2023-10-12 DIAGNOSIS — N400 Enlarged prostate without lower urinary tract symptoms: Secondary | ICD-10-CM

## 2023-10-12 DIAGNOSIS — N1831 Chronic kidney disease, stage 3a: Secondary | ICD-10-CM

## 2023-10-12 DIAGNOSIS — R339 Retention of urine, unspecified: Secondary | ICD-10-CM

## 2023-10-12 NOTE — Telephone Encounter (Signed)
Appointment scheduled. Scan ordered.

## 2023-10-13 NOTE — Telephone Encounter (Signed)
Noted call was made to pt by Vertis Kelch, CMA. Review result message

## 2023-10-19 ENCOUNTER — Encounter: Payer: Self-pay | Admitting: Family Medicine

## 2023-10-19 DIAGNOSIS — R0789 Other chest pain: Secondary | ICD-10-CM | POA: Insufficient documentation

## 2023-10-19 NOTE — Assessment & Plan Note (Signed)
Constant, dull ache in the chest for two weeks, worse in the morning. No radiation of pain. EKG does not show any acute changes. Given the nature of the pain and the patient's history, there is a concern for possible cardiac etiology. -Recommend Troponin, CBC, CMET stat -Stat chest xray -EKG: EKG, unchanged from previous tracings, sinus bradycardia.  -Plan for cardiology follow up, have reached out to Dr Mariah Milling for recommendations    Addendum: No changes on EKG Troponins negative No further cardiac work up indicated Currently on PPI If continues to have pain follow up for further evaluation.

## 2023-10-19 NOTE — Assessment & Plan Note (Signed)
Reports of dizziness, particularly upon standing. No focal deficits on neuro exam -Orthostatics normal -Increase fluids

## 2023-11-04 ENCOUNTER — Encounter: Payer: Self-pay | Admitting: Family Medicine

## 2023-11-04 DIAGNOSIS — E1165 Type 2 diabetes mellitus with hyperglycemia: Secondary | ICD-10-CM

## 2023-11-04 MED ORDER — FREESTYLE LIBRE 2 SENSOR MISC: 11 refills | Status: AC

## 2023-11-12 ENCOUNTER — Telehealth: Payer: Medicare Other | Admitting: Physician Assistant

## 2023-11-12 DIAGNOSIS — B9689 Other specified bacterial agents as the cause of diseases classified elsewhere: Secondary | ICD-10-CM | POA: Diagnosis not present

## 2023-11-12 DIAGNOSIS — J019 Acute sinusitis, unspecified: Secondary | ICD-10-CM | POA: Diagnosis not present

## 2023-11-12 MED ORDER — AMOXICILLIN-POT CLAVULANATE 875-125 MG PO TABS: 1.0000 | ORAL_TABLET | Freq: Two times a day (BID) | ORAL | 0 refills | Status: DC

## 2023-11-12 NOTE — Progress Notes (Signed)

## 2023-12-08 NOTE — Progress Notes (Unsigned)
Cardiology Office Note    Date:  12/09/2023   ID:  Jackson [name], Jackson [name] Mar 22, 1951, MRN [id number]  PCP:  Dana Allan, MD  Cardiologist:  Julien Nordmann, MD  Electrophysiologist:  None   Chief Complaint: Follow-up  History of Present Illness:   Jackson [name] is a 73 y.o. male with history of  SVT, DM2, HLD with statin intolerance, multinodular goiter, and sleep apnea who presents for follow-up of chest pain.   He previously underwent stress echo through [facility] in 2010, for preoperative cardiac risk stratification, with results being unavailable for review.  He established care with Dr. Mariah Milling in 2019 for cardiac risk stratification.  CT imaging at that time showed no evidence of coronary artery calcification or aortic atherosclerosis with minimal calcification of the aortic valve noted.  He had been maintained on metoprolol for SVT.  He was evaluated by his PCP on 04/24/2022 with shortness of breath and chest pain.  Echo demonstrated an EF of 55 to 60%, no regional wall motion abnormalities, grade 1 diastolic dysfunction, normal RV systolic function and ventricular cavity size, mildly dilated left atrium, aortic valve sclerosis without evidence of stenosis, and borderline dilatation of the aortic root and ascending aorta.  He was evaluated by our office on 05/01/2022 with continued progressive exertional shortness of breath, as reported by his wife.  The patient indicated he had not significantly noticed a change in his breathing.  He was able to ride a recumbent bike without symptoms of dyspnea.  No symptoms similar to his prior SVT.  He did note some intermittent chest tightness that was randomly occurring, though would also occur when carrying heavy objects.  Coronary CTA on 05/07/2022 showed no significant extracardiac findings.  Cardiac portion showed a calcium score of 0 with no evidence of CAD, as well as a normal size aorta.  He was last seen in the office in 05/2022 and was without symptoms of  angina or cardiac decompensation.  He was seen by his PCP in 09/2023 with a 2-week history of chest pain without radiation.  EKG at that time showed sinus bradycardia with a first-degree AV block.  High-sensitivity troponin negative x 1.  No further testing pursued.  He comes in doing well from a cardiac perspective he indicates back in December he developed symptoms of chest pain that were not associated with exertion.  At the time, he believed the pain was related to weather changes.  Symptoms did improve with Advil.  He has continued to have some intermittent chest discomfort, particularly with weather change, though not as significant as what he experienced back in December.  Symptoms are nonexertional.  No associated symptoms.  No palpitations, presyncope, or syncope.  No lower extremity swelling or progressive orthopnea.  He did have a mechanical fall this past summer, without head trauma or LOC.  He continues to note some right shoulder discomfort following this fall.  No hematochezia or melena.  Currently chest pain-free.   Labs independently reviewed: 09/2023 - Hgb 15.3, PLT 218, potassium 3.9, BUN 16, serum creatinine 1.18, albumin 4.2, AST/ALT normal 08/2023 - TC 199, TG 126, HDL 31, LDL 143, A1c 6.8 06/2023 - TSH normal  Past Medical History:  Diagnosis Date   Actinic keratosis    Allergy    Anemia    Arthritis    Basal cell carcinoma    Basal cell carcinoma 11/17/2022   Left forehead. Superficial and nodular pattern. Mohs 12/01/2022   Basal cell carcinoma 02/17/2023  Right upper back. Superficial. EDC   BCC (basal cell carcinoma) 06/03/2022   left forearm, EDC 06/23/22   BCC (basal cell carcinoma) 06/03/2022   right forearm, EDC 06/23/22   Bradycardia 03/03/2018   Cancer (HCC)    BASAL CELL-FACE   Cholecystitis    Complication of anesthesia    PT IS A RED HEAD AND STATES IT TAKES ALOT MORE ANESTHESIA TO SEDATE HIM   Diabetes mellitus without complication (HCC)    Dysplastic  nevus 07/05/2023   right lateral thigh, moderate to severe. Excised 08/04/2023, margins free.   Dysrhythmia    SVT   Elevated PSA 06/13/2019   Fatty liver    GERD (gastroesophageal reflux disease)    Hyperlipidemia    Melanoma (HCC) 02/02/2019   Left forearm. Superficial spreading. Tumor thickness 0.54mm, anatomic level III. Excised: 02/22/2019, margins free.   Multiple thyroid nodules    Paroxysmal SVT (supraventricular tachycardia) (HCC) 05/27/2016   Rheumatic fever    Sleep apnea 1997   Uses C-Pap machine   SVT (supraventricular tachycardia) (HCC)    Thyroid nodule     Past Surgical History:  Procedure Laterality Date   APPENDECTOMY  1965   BICEPS TENDON REPAIR Right 05/2015   CHOLECYSTECTOMY  03/2021   COLONOSCOPY  2016   HERNIA REPAIR  6/22   LUMBAR FUSION  2015   L4/L5   RHINOPLASTY  1997   SHOULDER SURGERY Left    2001   SPINE SURGERY  06/2013   Fused L4-L5   TONSILLECTOMY  1956   VASECTOMY  1981    Current Medications: Current Meds  Medication Sig   azelastine (ASTELIN) 0.1 % nasal spray Place 1 spray into both nostrils 2 (two) times daily. Use in each nostril as directed   cholecalciferol (VITAMIN D3) 25 MCG (1000 UT) tablet Take 1,000 Units by mouth daily.   Coenzyme Q10 (COQ10) 200 MG CAPS Take 200 mg by mouth daily.    Continuous Glucose Sensor (FREESTYLE LIBRE 2 SENSOR) MISC USE TO CHECK BLOOD SUGAR EVERY 8 HOURS   Dulaglutide (TRULICITY) 0.75 MG/0.5ML SOPN INJECT 0.5 ML (0.75 MG TOTAL) UNDER THE SKIN ONCE A WEEK   fexofenadine (ALLEGRA) 60 MG tablet TAKE 1 TABLET TWICE A DAY   finasteride (PROSCAR) 5 MG tablet Take 1 tablet (5 mg total) by mouth daily.   metoprolol succinate (TOPROL-XL) 50 MG 24 hr tablet TAKE 1 TABLET DAILY WITH OR IMMEDIATELY FOLLOWING A MEAL (DOSE CHANGE)   Multiple Vitamins-Minerals (MULTIVITAMIN ADULT PO) Take 1 tablet by mouth daily.    mupirocin ointment (BACTROBAN) 2 % Apply 1 Application topically daily.   Omega-3 Fatty Acids (FISH  OIL) 1200 MG CPDR Take 1 tablet by mouth daily.   omeprazole (PRILOSEC) 40 MG capsule Take 1 capsule (40 mg total) by mouth daily. 30 min before food   tamsulosin (FLOMAX) 0.4 MG CAPS capsule Take 1 capsule (0.4 mg total) by mouth at bedtime.   vitamin C (ASCORBIC ACID) 500 MG tablet Take 1,000 mg by mouth daily.    Vitamin E 400 units TABS Take 400 Units by mouth daily.     Allergies:   Januvia [sitagliptin], Lipitor [atorvastatin], and Statins   Social History   Socioeconomic History   Marital status: Married    Spouse name: Not on file   Number of children: Not on file   Years of education: Not on file   Highest education level: Master's degree (e.g., MA, MS, MEng, MEd, MSW, MBA)  Occupational History  Not on file  Tobacco Use   Smoking status: Never   Smokeless tobacco: Former    Types: Snuff  Vaping Use   Vaping status: Never Used  Substance and Sexual Activity   Alcohol use: Yes    Alcohol/week: 1.0 - 2.0 standard drink of alcohol    Types: 1 - 2 Cans of beer per week    Comment: 1-2 per week   Drug use: No   Sexual activity: Not Currently    Birth control/protection: Surgical    Comment: Vasectomy 1982  Other Topics Concern   Not on file  Social History Narrative   Married    Former Hotel manager    2 daughters   Social Drivers of Corporate investment banker Strain: Low Risk  (10/11/2023)   Overall Financial Resource Strain (CARDIA)    Difficulty of Paying Living Expenses: Not hard at all  Food Insecurity: No Food Insecurity (10/11/2023)   Hunger Vital Sign    Worried About Running Out of Food in the Last Year: Never true    Ran Out of Food in the Last Year: Never true  Transportation Needs: No Transportation Needs (10/11/2023)   PRAPARE - Administrator, Civil Service (Medical): No    Lack of Transportation (Non-Medical): No  Physical Activity: Insufficiently Active (10/11/2023)   Exercise Vital Sign    Days of Exercise per Week: 2 days     Minutes of Exercise per Session: 20 min  Stress: No Stress Concern Present (10/11/2023)   [employer] of Occupational Health - Occupational Stress Questionnaire    Feeling of Stress : Only a little  Social Connections: Moderately Integrated (10/11/2023)   Social Connection and Isolation Panel [NHANES]    Frequency of Communication with Friends and Family: More than three times a week    Frequency of Social Gatherings with Friends and Family: Once a week    Attends Religious Services: Never    Database administrator or Organizations: Yes    Attends Engineer, structural: More than 4 times per year    Marital Status: Married     Family History:  The patient's family history includes ADD / ADHD in his daughter; Alcohol abuse in his maternal grandfather; Arthritis in his father and mother; Cholelithiasis in his brother, daughter, and mother; Diabetes in his father and mother; Drug abuse in his cousin; Hyperlipidemia in his maternal grandfather, maternal grandmother, mother, and another family member; Lung cancer in his mother; Lung disease in his father; Pancreatitis in his father.  ROS:   12-point review of systems is negative unless otherwise noted in the HPI.   EKGs/Labs/Other Studies Reviewed:    Studies reviewed were summarized above. The additional studies were reviewed today:  2D echo 05/07/2022: 1. Left ventricular ejection fraction, by estimation, is 55 to 60%. The  left ventricle has normal function. The left ventricle has no regional  wall motion abnormalities. Left ventricular diastolic parameters are  consistent with Grade I diastolic  dysfunction (impaired relaxation).   2. Right ventricular systolic function is normal. The right ventricular  size is normal.   3. Left atrial size was mildly dilated.   4. There is no evidence of cardiac tamponade.   5. The mitral valve is normal in structure. No evidence of mitral valve  regurgitation. No evidence of mitral  stenosis.   6. The aortic valve has an indeterminant number of cusps. Aortic valve  regurgitation is not visualized. Aortic valve sclerosis is present,  with  no evidence of aortic valve stenosis.   7. There is borderline dilatation of the aortic root and of the ascending  aorta, measuring 37 mm.   8. The inferior vena cava is normal in size with greater than 50%  respiratory variability, suggesting right atrial pressure of 3 mmHg. __________   Coronary CTA 05/07/2022: FINDINGS: Aorta:  Normal size.  No calcifications.  No dissection.   Aortic Valve:  Trileaflet.  No calcifications.   Coronary Arteries:  Normal coronary origin.  Right dominance.   RCA is a dominant artery that gives rise to PDA and PLA. There is no plaque.   Left main gives rise to LAD and LCX arteries.  LM has no disease.   LAD has no plaque.   LCX is a non-dominant artery that gives rise to two obtuse marginal branches. There is no plaque.   Other findings:   Normal pulmonary vein drainage into the left atrium.   Normal left atrial appendage without a thrombus.   Normal size of the pulmonary artery.   IMPRESSION: 1. Normal coronary calcium score of 0. Patient is low risk for coronary events. 2. Normal coronary origin with right dominance. 3. No evidence of CAD. 4. CAD-RADS 0. Consider non-atherosclerotic causes of chest pain.   EKG:  EKG is ordered today.  The EKG ordered today demonstrates sinus bradycardia, 56 bpm, first-degree AV block, no acute ST-T changes  Recent Labs: 10/11/2023: ALT 38; BUN 16; Creatinine, Ser 1.18; Hemoglobin 15.3; Platelets 218; Potassium 3.9; Sodium 136  Recent Lipid Panel    Component Value Date/Time   CHOL 199 08/30/2023 0733   TRIG 126.0 08/30/2023 0733   HDL 31.20 (L) 08/30/2023 0733   CHOLHDL 6 08/30/2023 0733   VLDL 25.2 08/30/2023 0733   LDLCALC 143 (H) 08/30/2023 0733   LDLDIRECT 155.0 07/19/2020 0803    PHYSICAL EXAM:    VS:  BP 115/63 (BP Location:  Left Arm, Patient Position: Sitting, Cuff Size: Normal)   Pulse (!) 56   Ht 6\' 1"  (1.854 m)   Wt 234 lb 9.6 oz (106.4 kg)   SpO2 97%   BMI 30.95 kg/m   BMI: Body mass index is 30.95 kg/m.  Physical Exam Vitals reviewed.  Constitutional:      Appearance: He is well-developed.  HENT:     Head: Normocephalic and atraumatic.  Eyes:     General:        Right eye: No discharge.        Left eye: No discharge.  Cardiovascular:     Rate and Rhythm: Regular rhythm. Bradycardia present.     Pulses:          Posterior tibial pulses are 2+ on the right side and 2+ on the left side.     Heart sounds: Normal heart sounds, S1 normal and S2 normal. Heart sounds not distant. No midsystolic click and no opening snap. No murmur heard.    No friction rub.  Pulmonary:     Effort: Pulmonary effort is normal. No respiratory distress.     Breath sounds: Normal breath sounds. No decreased breath sounds, wheezing, rhonchi or rales.  Chest:     Chest wall: No tenderness.  Musculoskeletal:     Cervical back: Normal range of motion.     Right lower leg: No edema.     Left lower leg: No edema.  Skin:    General: Skin is warm and dry.     Nails: There is no clubbing.  Neurological:     Mental Status: He is alert and oriented to person, place, and time.  Psychiatric:        Speech: Speech normal.        Behavior: Behavior normal.        Thought Content: Thought content normal.        Judgment: Judgment normal.     Wt Readings from Last 3 Encounters:  12/09/23 234 lb 9.6 oz (106.4 kg)  10/11/23 243 lb (110.2 kg)  09/30/23 228 lb (103.4 kg)     ASSESSMENT & PLAN:   Precordial pain: Currently chest pain-free.  Coronary CTA in 04/2022 that calcium score of 0 and no evidence of CAD.  Echo in 2023 without significant structural abnormalities.  Had an episode of chest pain back in December 2024 that was felt to be atypical in etiology.  High-sensitivity troponin negative at that time.  For further risk  stratification, in the setting of intermittent episodes we will pursue myocardial PET/CT.  If this is reassuring, no further cardiac testing at this time.  SVT: Quiescent.  Remains on Toprol-XL 50 mg.  HLD with statin intolerance: LDL 143 in 08/2023.  Intolerant to low and high intensity statins.  Previously documented to not have significant lipid-lowering effect with ezetimibe.  Has been evaluated by our lipid clinic and previously not felt to be candidate for PCSK9 inhibitor or bempedoic acid.   Informed Consent   Shared Decision Making/Informed Consent{  The risks [chest pain, shortness of breath, cardiac arrhythmias, dizziness, blood pressure fluctuations, myocardial infarction, stroke/transient ischemic attack, nausea, vomiting, allergic reaction, radiation exposure, metallic taste sensation and life-threatening complications (estimated to be 1 in 10,000)], benefits (risk stratification, diagnosing coronary artery disease, treatment guidance) and alternatives of a cardiac PET stress test were discussed in detail with Mr. Stigger and he agrees to proceed.        Disposition: F/u with Dr. Mariah Milling or an APP in 12 months, sooner if needed based on symptoms or stress test results.   Medication Adjustments/Labs and Tests Ordered: Current medicines are reviewed at length with the patient today.  Concerns regarding medicines are outlined above. Medication changes, Labs and Tests ordered today are summarized above and listed in the Patient Instructions accessible in Encounters.   Signed, Eula Listen, PA-C 12/09/2023 4:36 PM      [facility] [address] [place], [place] [address] [phone]

## 2023-12-09 ENCOUNTER — Ambulatory Visit: Payer: Medicare Other | Attending: Physician Assistant | Admitting: Physician Assistant

## 2023-12-09 ENCOUNTER — Encounter: Payer: Self-pay | Admitting: Physician Assistant

## 2023-12-09 VITALS — BP 115/63 | HR 56 | Ht 73.0 in | Wt 234.6 lb

## 2023-12-09 DIAGNOSIS — R072 Precordial pain: Secondary | ICD-10-CM | POA: Diagnosis not present

## 2023-12-09 DIAGNOSIS — E782 Mixed hyperlipidemia: Secondary | ICD-10-CM | POA: Insufficient documentation

## 2023-12-09 DIAGNOSIS — I471 Supraventricular tachycardia, unspecified: Secondary | ICD-10-CM | POA: Insufficient documentation

## 2023-12-09 DIAGNOSIS — Z789 Other specified health status: Secondary | ICD-10-CM | POA: Insufficient documentation

## 2023-12-09 DIAGNOSIS — R079 Chest pain, unspecified: Secondary | ICD-10-CM | POA: Diagnosis not present

## 2023-12-09 NOTE — Patient Instructions (Signed)
Medication Instructions:  Your Physician recommend you continue on your current medication as directed.    *If you need a refill on your cardiac medications before your next appointment, please call your pharmacy*   Lab Work: None ordered at this time  If you have labs (blood work) drawn today and your tests are completely normal, you will receive your results only by: MyChart Message (if you have MyChart) OR A paper copy in the mail If you have any lab test that is abnormal or we need to change your treatment, we will call you to review the results.   Testing/Procedures: Please report to Radiology at [facility] Main Entrance, medical mall, 30 mins prior to your test.  [address]  [place], [place]  How to Prepare for Your Cardiac PET/CT Stress Test:  Nothing to eat or drink, except water, 3 hours prior to arrival time.  NO caffeine/decaffeinated products, or chocolate 12 hours prior to arrival. (Please note decaffeinated beverages (teas/coffees) still contain caffeine).  If you have caffeine within 12 hours prior, the test will need to be rescheduled.  Medication instructions: Do not take nitrates (isosorbide mononitrate, Ranexa) the day before or day of test Do not take tamsulosin the day before or morning of test Hold theophylline containing medications for 12 hours. Hold Dipyridamole 48 hours prior to the test.  Diabetic Preparation: If able to eat breakfast prior to 3 hour fasting, you may take all medications, including your insulin. Do not worry if you miss your breakfast dose of insulin - start at your next meal. If you do not eat prior to 3 hour fast-Hold all diabetes (oral and insulin) medications. Patients who wear a continuous glucose monitor MUST remove the device prior to scanning.  You may take your remaining medications with water.  NO perfume, cologne or lotion on chest or abdomen area.  Total time is 1 to 2 hours; you may want to  bring reading material for the waiting time.  IF YOU THINK YOU MAY BE PREGNANT, OR ARE NURSING PLEASE INFORM THE TECHNOLOGIST.  In preparation for your appointment, medication and supplies will be purchased.  Appointment availability is limited, so if you need to cancel or reschedule, please call the Radiology Department Scheduler at [phone] 24 hours in advance to avoid a cancellation fee of $100.00  What to Expect When you Arrive:  Once you arrive and check in for your appointment, you will be taken to a preparation room within the Radiology Department.  A technologist or Nurse will obtain your medical history, verify that you are correctly prepped for the exam, and explain the procedure.  Afterwards, an IV will be started in your arm and electrodes will be placed on your skin for EKG monitoring during the stress portion of the exam. Then you will be escorted to the PET/CT scanner.  There, staff will get you positioned on the scanner and obtain a blood pressure and EKG.  During the exam, you will continue to be connected to the EKG and blood pressure machines.  A small, safe amount of a radioactive tracer will be injected in your IV to obtain a series of pictures of your heart along with an injection of a stress agent.    After your Exam:  It is recommended that you eat a meal and drink a caffeinated beverage to counter act any effects of the stress agent.  Drink plenty of fluids for the remainder of the day and urinate frequently for  the first couple of hours after the exam.  Your doctor will inform you of your test results within 7-10 business days.  For more information and frequently asked questions, please visit our website: [email or web address]  For questions about your test or how to prepare for your test, please call: Cardiac Imaging Nurse Navigators Office: [phone]    Follow-Up: At [facility], you and your health needs are our priority.  As part of  our continuing mission to provide you with exceptional heart care, we have created designated Provider Care Teams.  These Care Teams include your primary Cardiologist (physician) and Advanced Practice Providers (APPs -  Physician Assistants and Nurse Practitioners) who all work together to provide you with the care you need, when you need it.  We recommend signing up for the patient portal called "MyChart".  Sign up information is provided on this After Visit Summary.  MyChart is used to connect with patients for Virtual Visits (Telemedicine).  Patients are able to view lab/test results, encounter notes, upcoming appointments, etc.  Non-urgent messages can be sent to your provider as well.   To learn more about what you can do with MyChart, go to [email or web address].    Your next appointment:   12 month(s)  Provider:   You may see Julien Nordmann, MD or one of the following Advanced Practice Providers on your designated Care Team:   Eula Listen, [place]

## 2023-12-15 ENCOUNTER — Ambulatory Visit: Payer: Medicare Other | Admitting: Family Medicine

## 2023-12-16 ENCOUNTER — Ambulatory Visit: Payer: Medicare Other | Admitting: Family Medicine

## 2023-12-16 ENCOUNTER — Ambulatory Visit: Payer: Medicare Other

## 2023-12-16 ENCOUNTER — Encounter: Payer: Self-pay | Admitting: Family Medicine

## 2023-12-16 VITALS — BP 108/64 | HR 58 | Temp 98.4°F | Resp 18 | Ht 73.0 in | Wt 234.4 lb

## 2023-12-16 DIAGNOSIS — R29898 Other symptoms and signs involving the musculoskeletal system: Secondary | ICD-10-CM

## 2023-12-16 DIAGNOSIS — E119 Type 2 diabetes mellitus without complications: Secondary | ICD-10-CM

## 2023-12-16 DIAGNOSIS — E1159 Type 2 diabetes mellitus with other circulatory complications: Secondary | ICD-10-CM | POA: Diagnosis not present

## 2023-12-16 DIAGNOSIS — R0789 Other chest pain: Secondary | ICD-10-CM

## 2023-12-16 DIAGNOSIS — I152 Hypertension secondary to endocrine disorders: Secondary | ICD-10-CM

## 2023-12-16 DIAGNOSIS — Z7985 Long-term (current) use of injectable non-insulin antidiabetic drugs: Secondary | ICD-10-CM

## 2023-12-16 DIAGNOSIS — M25511 Pain in right shoulder: Secondary | ICD-10-CM | POA: Diagnosis not present

## 2023-12-16 DIAGNOSIS — M19011 Primary osteoarthritis, right shoulder: Secondary | ICD-10-CM | POA: Diagnosis not present

## 2023-12-16 LAB — POCT GLYCOSYLATED HEMOGLOBIN (HGB A1C): Hemoglobin A1C: 7 % — AB (ref 4.0–5.6)

## 2023-12-16 NOTE — Progress Notes (Signed)
 SUBJECTIVE:   Chief Complaint  Patient presents with   Medical Management of Chronic Issues    3 month follow up   HPI Presents for follow up chronic disease management  Discussed the use of AI scribe software for clinical note transcription with the patient, who gave verbal consent to proceed.  History of Present Illness Jackson [name] "Jackson [name]" is a 73 year old male who presents with right shoulder pain following a fall and subsequent injury.  He has been experiencing right shoulder pain since a fall in August of the previous year, when he tripped while walking across a field and landed on his shoulder. The pain worsened in January after lifting luggage into an overhead compartment during a flight. He now has difficulty lifting his arm, especially overhead, and notes weakness in the shoulder. The pain occurs with arm movement, and he denies numbness or tingling but mentions an ache after prolonged use. His right hand is dominant. He has a history of shoulder surgery in 2001 and previously played competitive racquetball, which involved significant overhead activity.  He recently had a cardiology consultation where a PET scan was scheduled for late March to investigate potential small vessel blockages. A previous CT scan showed no significant blockages or calcifications, except for a minor spot attributed to rheumatic fever from the early 1960s. He has experienced chest pain in the past, initially thought to be heartburn, but he is currently taking Prilosec every other day without significant issues.  He is concerned about a previous mention of stage three kidney failure by another provider, but his review of lab results shows a GFR above 60. He has been avoiding NSAIDs to protect his kidney function.  He has been managing his blood sugar levels aggressively and has been off metformin for over a month. His recent blood sugar readings have been mostly below 150, and he continues to take  Trulicity 0.75 mg weekly. His last A1c was 6.8% in November.  His blood pressure is well-controlled on metoprolol 50 mg.  He has a history of GERD since 1992 and was previously on daily Prilosec, now taking it every other day without significant issues.      PERTINENT PMH / PSH: As above  OBJECTIVE:  BP 108/64   Pulse (!) 58   Temp 98.4 F (36.9 C)   Resp 18   Ht 6\' 1"  (1.854 m)   Wt 234 lb 6 oz (106.3 kg)   SpO2 97%   BMI 30.92 kg/m    Physical Exam Vitals reviewed.  Constitutional:      General: He is not in acute distress.    Appearance: Normal appearance. He is normal weight. He is not ill-appearing, toxic-appearing or diaphoretic.  Eyes:     General:        Right eye: No discharge.        Left eye: No discharge.  Cardiovascular:     Rate and Rhythm: Regular rhythm. Bradycardia present.     Heart sounds: Normal heart sounds.  Pulmonary:     Effort: Pulmonary effort is normal.     Breath sounds: Normal breath sounds.  Abdominal:     General: Bowel sounds are normal.  Musculoskeletal:     Right shoulder: Tenderness present. No swelling. Decreased range of motion. Decreased strength. Normal pulse.     Left shoulder: No swelling, deformity or tenderness. Normal range of motion. Normal strength. Normal pulse.     Cervical back: Normal range of motion.  Skin:  General: Skin is warm and dry.  Neurological:     Mental Status: He is alert and oriented to person, place, and time. Mental status is at baseline.  Psychiatric:        Mood and Affect: Mood normal.        Behavior: Behavior normal.        Thought Content: Thought content normal.        Judgment: Judgment normal.           12/16/2023    2:41 PM 09/14/2023   11:15 AM 02/05/2023   10:50 AM 09/09/2022    2:20 PM 08/10/2022    1:57 PM  Depression screen PHQ 2/9  Decreased Interest 0 0 0 0 0  Down, Depressed, Hopeless 0 0 0 0 0  PHQ - 2 Score 0 0 0 0 0  Altered sleeping 0 2     Tired, decreased  energy 0 2     Change in appetite 0 0     Feeling bad or failure about yourself  0 0     Trouble concentrating 0 0     Moving slowly or fidgety/restless 0 0     Suicidal thoughts 0 0     PHQ-9 Score 0 4     Difficult doing work/chores Not difficult at all Not difficult at all         12/16/2023    2:41 PM 09/14/2023   11:16 AM 03/19/2020    3:09 PM  GAD 7 : Generalized Anxiety Score  Nervous, Anxious, on Edge 0 0 0  Control/stop worrying 0 0 0  Worry too much - different things 0 0 0  Trouble relaxing 0 1 0  Restless 0 0 0  Easily annoyed or irritable 0 0 0  Afraid - awful might happen 0 0 0  Total GAD 7 Score 0 1 0  Anxiety Difficulty Not difficult at all Not difficult at all Not difficult at all    ASSESSMENT/PLAN:  Type 2 diabetes mellitus without complication, without long-term current use of insulin (HCC) Assessment & Plan: Patient has been managing sugars aggressively and has stopped Metformin for over a month. Currently on Trulicity 0.75mg  weekly. -Perform point of care A1C today to assess glycemic control since stopping Metformin.  Orders: -     Microalbumin / creatinine urine ratio -     POCT glycosylated hemoglobin (Hb A1C)  Weakness of right shoulder Assessment & Plan: Cannot rule out rotator cuff injury with weakness and decrease in ROM noted on examination. No numbness or tingling reported. Pain exacerbated by certain movements.  -Right shoulder xray today -Order MRI of right shoulder to further evaluate -Referral to orthopedics depending on MRI results.  Orders: -     DG Shoulder Right -     MR SHOULDER RIGHT WO CONTRAST  Hypertension associated with diabetes (HCC) Assessment & Plan: Well controlled on Metoprolol 50mg  daily. Blood pressure at visit was 108/64. -Continue current regimen.   Atypical chest pain Assessment & Plan: Patient has history of chest pain, previously evaluated by cardiology. Scheduled for PET scan on 01/20/2024 for further  evaluation. -Continue current management as directed by cardiology.     PDMP reviewed  Return in about 6 months (around 06/14/2024) for PCP, DM.  Dana Allan, MD

## 2023-12-16 NOTE — Patient Instructions (Addendum)
 It was a pleasure meeting you today. Thank you for allowing me to take part in your health care.  Our goals for today as we discussed include:  A1c today is 7.0. Consider Jardiance in the future  Foot exam at next visit  Xray right shoulder today.  Results can take up wit 14 days to return  MRI right shoulder ordered.  They will call with appointment  Can refer to Dr Teryl Lucy at [facility] in [place]. [address], [place], [place] [address] Phone: [phone]    This is a list of the screening recommended for you and due dates:  Health Maintenance  Topic Date Due   Yearly kidney health urinalysis for diabetes  04/28/2023   COVID-19 Vaccine (4 - 2024-25 season) 06/27/2023   Complete foot exam   09/05/2023   Medicare Annual Wellness Visit  02/05/2024   Flu Shot  01/24/2024*   Zoster (Shingles) Vaccine (1 of 2) 03/14/2024*   Eye exam for diabetics  12/24/2023   Hemoglobin A1C  06/14/2024   Yearly kidney function blood test for diabetes  10/10/2024   Cologuard (Stool DNA test)  09/20/2026   DTaP/Tdap/Td vaccine (2 - Td or Tdap) 03/21/2028   Pneumonia Vaccine  Completed   HPV Vaccine  Aged Out   Colon Cancer Screening  Discontinued  *Topic was postponed. The date shown is not the original due date.      If you have any questions or concerns, please do not hesitate to call the office at [phone].  I look forward to our next visit and until then take care and stay safe.  Regards,   Dana Allan, MD   [facility]

## 2023-12-17 LAB — MICROALBUMIN / CREATININE URINE RATIO
Creatinine,U: 73.8 mg/dL
Microalb Creat Ratio: 9.5 mg/g (ref 0.0–30.0)
Microalb, Ur: <0.7 mg/dL (ref 0.0–1.9)

## 2023-12-21 ENCOUNTER — Ambulatory Visit
Admission: RE | Admit: 2023-12-21 | Discharge: 2023-12-21 | Disposition: A | Discharge status: Home / Self Care | Payer: Medicare Other | Source: Ambulatory Visit | Attending: Family Medicine | Admitting: Family Medicine

## 2023-12-21 DIAGNOSIS — M25411 Effusion, right shoulder: Secondary | ICD-10-CM | POA: Diagnosis not present

## 2023-12-21 DIAGNOSIS — R29898 Other symptoms and signs involving the musculoskeletal system: Secondary | ICD-10-CM | POA: Diagnosis not present

## 2023-12-21 DIAGNOSIS — S46811A Strain of other muscles, fascia and tendons at shoulder and upper arm level, right arm, initial encounter: Secondary | ICD-10-CM | POA: Diagnosis not present

## 2023-12-21 DIAGNOSIS — M7581 Other shoulder lesions, right shoulder: Secondary | ICD-10-CM | POA: Diagnosis not present

## 2023-12-21 DIAGNOSIS — M25511 Pain in right shoulder: Secondary | ICD-10-CM | POA: Diagnosis not present

## 2023-12-23 ENCOUNTER — Encounter: Payer: Self-pay | Admitting: Family Medicine

## 2023-12-23 DIAGNOSIS — R29898 Other symptoms and signs involving the musculoskeletal system: Secondary | ICD-10-CM | POA: Insufficient documentation

## 2023-12-23 NOTE — Assessment & Plan Note (Signed)
 Well controlled on Metoprolol 50mg  daily. Blood pressure at visit was 108/64. -Continue current regimen.

## 2023-12-23 NOTE — Assessment & Plan Note (Signed)
 Patient has been managing sugars aggressively and has stopped Metformin for over a month. Currently on Trulicity 0.75mg  weekly. -Perform point of care A1C today to assess glycemic control since stopping Metformin.

## 2023-12-23 NOTE — Assessment & Plan Note (Signed)
 Patient has history of chest pain, previously evaluated by cardiology. Scheduled for PET scan on 01/20/2024 for further evaluation. -Continue current management as directed by cardiology.

## 2023-12-23 NOTE — Assessment & Plan Note (Addendum)
 Cannot rule out rotator cuff injury with weakness and decrease in ROM noted on examination. No numbness or tingling reported. Pain exacerbated by certain movements.  -Right shoulder xray today -Order MRI of right shoulder to further evaluate -Referral to orthopedics depending on MRI results.

## 2023-12-24 ENCOUNTER — Telehealth: Payer: Self-pay

## 2023-12-24 NOTE — Telephone Encounter (Signed)
 Copied from CRM [id number]. Topic: Clinical - Lab/Test Results >> Dec 24, 2023 10:11 AM Jackson [name] wrote: Reason for CRM: Patient is returning a call from [name] regarding MRI results.

## 2023-12-27 DIAGNOSIS — H43813 Vitreous degeneration, bilateral: Secondary | ICD-10-CM | POA: Diagnosis not present

## 2023-12-27 DIAGNOSIS — H2513 Age-related nuclear cataract, bilateral: Secondary | ICD-10-CM | POA: Diagnosis not present

## 2023-12-27 DIAGNOSIS — E119 Type 2 diabetes mellitus without complications: Secondary | ICD-10-CM | POA: Diagnosis not present

## 2023-12-30 ENCOUNTER — Other Ambulatory Visit: Payer: Self-pay | Admitting: Family Medicine

## 2023-12-30 DIAGNOSIS — R29898 Other symptoms and signs involving the musculoskeletal system: Secondary | ICD-10-CM

## 2023-12-30 NOTE — Telephone Encounter (Signed)
 Called patient reviewed all information and repeated back to me. Will call if any questions.  He stated that he would like to see Teryl Lucy, MD in [place].

## 2024-01-03 ENCOUNTER — Other Ambulatory Visit: Payer: Self-pay | Admitting: Family Medicine

## 2024-01-03 ENCOUNTER — Encounter: Payer: Self-pay | Admitting: Dermatology

## 2024-01-03 ENCOUNTER — Ambulatory Visit (INDEPENDENT_AMBULATORY_CARE_PROVIDER_SITE_OTHER): Payer: Medicare Other | Admitting: Dermatology

## 2024-01-03 DIAGNOSIS — L570 Actinic keratosis: Secondary | ICD-10-CM

## 2024-01-03 DIAGNOSIS — L814 Other melanin hyperpigmentation: Secondary | ICD-10-CM | POA: Diagnosis not present

## 2024-01-03 DIAGNOSIS — L820 Inflamed seborrheic keratosis: Secondary | ICD-10-CM | POA: Diagnosis not present

## 2024-01-03 DIAGNOSIS — L910 Hypertrophic scar: Secondary | ICD-10-CM | POA: Diagnosis not present

## 2024-01-03 DIAGNOSIS — L821 Other seborrheic keratosis: Secondary | ICD-10-CM

## 2024-01-03 DIAGNOSIS — L578 Other skin changes due to chronic exposure to nonionizing radiation: Secondary | ICD-10-CM

## 2024-01-03 DIAGNOSIS — W908XXA Exposure to other nonionizing radiation, initial encounter: Secondary | ICD-10-CM

## 2024-01-03 DIAGNOSIS — Z85820 Personal history of malignant melanoma of skin: Secondary | ICD-10-CM | POA: Diagnosis not present

## 2024-01-03 DIAGNOSIS — Z85828 Personal history of other malignant neoplasm of skin: Secondary | ICD-10-CM | POA: Diagnosis not present

## 2024-01-03 DIAGNOSIS — Z1283 Encounter for screening for malignant neoplasm of skin: Secondary | ICD-10-CM | POA: Diagnosis not present

## 2024-01-03 DIAGNOSIS — D1801 Hemangioma of skin and subcutaneous tissue: Secondary | ICD-10-CM

## 2024-01-03 DIAGNOSIS — R29898 Other symptoms and signs involving the musculoskeletal system: Secondary | ICD-10-CM

## 2024-01-03 DIAGNOSIS — Z86018 Personal history of other benign neoplasm: Secondary | ICD-10-CM | POA: Diagnosis not present

## 2024-01-03 DIAGNOSIS — D229 Melanocytic nevi, unspecified: Secondary | ICD-10-CM

## 2024-01-03 MED ORDER — TRIAMCINOLONE ACETONIDE 40 MG/ML IJ SUSP
20.0000 mg | Freq: Once | INTRAMUSCULAR | Status: AC
  Administered 2024-01-03: 20 mg via INTRADERMAL

## 2024-01-03 NOTE — Progress Notes (Signed)
 Follow-Up Visit   Subjective  Jackson [name] is a 73 y.o. male who presents for the following: Skin Cancer Screening and Full Body Skin Exam. Hx of BCCs. Hx of dysplastic nevus with moderate to severe atypia. Hx of MM.   The patient presents for Total-Body Skin Exam (TBSE) for skin cancer screening and mole check. The patient has spots, moles and lesions to be evaluated, some may be new or changing and the patient may have concern these could be cancer.    The following portions of the chart were reviewed this encounter and updated as appropriate: medications, allergies, medical history  Review of Systems:  No other skin or systemic complaints except as noted in HPI or Assessment and Plan.  Objective  Well appearing patient in no apparent distress; mood and affect are within normal limits.  A full examination was performed including scalp, head, eyes, ears, nose, lips, neck, chest, axillae, abdomen, back, buttocks, bilateral upper extremities, bilateral lower extremities, hands, feet, fingers, toes, fingernails, and toenails. All findings within normal limits unless otherwise noted below.   Relevant physical exam findings are noted in the Assessment and Plan.  L temple x1, L cheek x1, L sup helix x1, nasal dorsum x1, L forearm x1, R forearm x1 (6) Erythematous thin papules/macules with gritty scale.  L sup forehead x1, L forearm x1 (2) Erythematous keratotic or waxy stuck-on papule or plaque.  Assessment & Plan   SKIN CANCER SCREENING PERFORMED TODAY.  HISTORY OF BASAL CELL CARCINOMA OF THE SKIN - L forehead, R upper back, R forearm, L forearm - No evidence of recurrence today - Recommend regular full body skin exams - Recommend daily broad spectrum sunscreen SPF 30+ to sun-exposed areas, reapply every 2 hours as needed.  - Call if any new or changing lesions are noted between office visits    HISTORY OF MELANOMA - Left forearm. Superficial spreading. Tumor thickness 0.57mm,  anatomic level III. Excised: 02/22/2019, margins free.  - No evidence of recurrence today - No lymphadenopathy - Recommend regular full body skin exams - Recommend daily broad spectrum sunscreen SPF 30+ to sun-exposed areas, reapply every 2 hours as needed.  - Call if any new or changing lesions are noted between office visits   HISTORY OF DYSPLASTIC NEVUS. Right lateral thigh, moderate to severe. Excised 08/04/2023, margins free  No evidence of recurrence today Recommend regular full body skin exams Recommend daily broad spectrum sunscreen SPF 30+ to sun-exposed areas, reapply every 2 hours as needed.  Call if any new or changing lesions are noted between office visits   ACTINIC DAMAGE - Chronic condition, secondary to cumulative UV/sun exposure - diffuse scaly erythematous macules with underlying dyspigmentation - Recommend daily broad spectrum sunscreen SPF 30+ to sun-exposed areas, reapply every 2 hours as needed.  - Staying in the shade or wearing long sleeves, sun glasses (UVA+UVB protection) and wide brim hats (4-inch brim around the entire circumference of the hat) are also recommended for sun protection.  - Call for new or changing lesions.  LENTIGINES, SEBORRHEIC KERATOSES, HEMANGIOMAS - Benign normal skin lesions - Benign-appearing - Call for any changes  MELANOCYTIC NEVI - Tan-brown and/or pink-flesh-colored symmetric macules and papules - Benign appearing on exam today - Observation - Call clinic for new or changing moles - Recommend daily use of broad spectrum spf 30+ sunscreen to sun-exposed areas.    HYPERTROPHIC SCAR   Related Medications triamcinolone acetonide (KENALOG-40) injection 20 mg  AK (ACTINIC KERATOSIS) (6) L temple  x1, L cheek x1, L sup helix x1, nasal dorsum x1, L forearm x1, R forearm x1 (6) Actinic keratoses are precancerous spots that appear secondary to cumulative UV radiation exposure/sun exposure over time. They are chronic with expected  duration over 1 year. A portion of actinic keratoses will progress to squamous cell carcinoma of the skin. It is not possible to reliably predict which spots will progress to skin cancer and so treatment is recommended to prevent development of skin cancer.  Recommend daily broad spectrum sunscreen SPF 30+ to sun-exposed areas, reapply every 2 hours as needed.  Recommend staying in the shade or wearing long sleeves, sun glasses (UVA+UVB protection) and wide brim hats (4-inch brim around the entire circumference of the hat). Call for new or changing lesions. Destruction of lesion - L temple x1, L cheek x1, L sup helix x1, nasal dorsum x1, L forearm x1, R forearm x1 (6) Complexity: simple   Destruction method: cryotherapy   Informed consent: discussed and consent obtained   Timeout:  patient name, date of birth, surgical site, and procedure verified Lesion destroyed using liquid nitrogen: Yes   Region frozen until ice ball extended beyond lesion: Yes   Cryo cycles: 1 or 2. Outcome: patient tolerated procedure well with no complications   Post-procedure details: wound care instructions given   Additional details:  Prior to procedure, discussed risks of blister formation, small wound, skin dyspigmentation, or rare scar following cryotherapy. Recommend Vaseline ointment to treated areas while healing.  INFLAMED SEBORRHEIC KERATOSIS (2) L sup forehead x1, L forearm x1 (2) Symptomatic, irritating, patient would like treated. Destruction of lesion - L sup forehead x1, L forearm x1 (2) Complexity: simple   Destruction method: cryotherapy   Informed consent: discussed and consent obtained   Timeout:  patient name, date of birth, surgical site, and procedure verified Lesion destroyed using liquid nitrogen: Yes   Region frozen until ice ball extended beyond lesion: Yes   Cryo cycles: 1 or 2. Outcome: patient tolerated procedure well with no complications   Post-procedure details: wound care  instructions given   Additional details:  Prior to procedure, discussed risks of blister formation, small wound, skin dyspigmentation, or rare scar following cryotherapy. Recommend Vaseline ointment to treated areas while healing.  MULTIPLE BENIGN NEVI   LENTIGINES   ACTINIC ELASTOSIS   SEBORRHEIC KERATOSES   CHERRY ANGIOMA    HYPERTROPHIC SCAR, secondary to EDC of BCC Exam: Pink indurated plaque at right upper back  Symptomatic, irritating, patient would like treated.  Benign-appearing.  Call clinic for new or changing lesions.   Treatment Plan:  Treat with intralesional triamcinolone.   Procedure Note Intralesional Injection Informed Consent: Discussed risks (infection, pain, bleeding, bruising, thinning of the skin, loss of skin pigment, lack of resolution, and recurrence of lesion) and benefits of the procedure, as well as the alternatives. Informed consent was obtained. Preparation: The area was prepared a standard fashion. Anesthesia: none Procedure Details: An intralesional injection was performed with Kenalog 20 mg/cc. 0.2 cc in total were injected. NDC #: [id number] Exp: 01/23/2025 Lot: [id number] Location: Right upper back  Total number of injections: 1 The patient was instructed on post-op care. Recommend OTC analgesia as needed for pain.   Intralesional steroid injection side effects were reviewed including thinning of the skin and discoloration, such as redness, lightening or darkening.   Return in about 6 months (around 07/05/2024) for TBSE, HxMM, HxBCC, HxDN.  I, Lawson Radar, CMA, am acting as scribe for Elie Goody, MD.  Documentation: I have reviewed the above documentation for accuracy and completeness, and I agree with the above.  Elie Goody, MD

## 2024-01-03 NOTE — Patient Instructions (Addendum)
 Cryotherapy Aftercare  Wash gently with soap and water everyday.   Apply Vaseline Jelly daily until healed.     Intralesional steroid injection side effects were reviewed including thinning of the skin and discoloration, such as redness, lightening or darkening.     Recommend daily broad spectrum sunscreen SPF 30+ to sun-exposed areas, reapply every 2 hours as needed. Call for new or changing lesions.  Staying in the shade or wearing long sleeves, sun glasses (UVA+UVB protection) and wide brim hats (4-inch brim around the entire circumference of the hat) are also recommended for sun protection.       Melanoma ABCDEs  Melanoma is the most dangerous type of skin cancer, and is the leading cause of death from skin disease.  You are more likely to develop melanoma if you: Have light-colored skin, light-colored eyes, or red or blond hair Spend a lot of time in the sun Tan regularly, either outdoors or in a tanning bed Have had blistering sunburns, especially during childhood Have a close family member who has had a melanoma Have atypical moles or large birthmarks  Early detection of melanoma is key since treatment is typically straightforward and cure rates are extremely high if we catch it early.   The first sign of melanoma is often a change in a mole or a new dark spot.  The ABCDE system is a way of remembering the signs of melanoma.  A for asymmetry:  The two halves do not match. B for border:  The edges of the growth are irregular. C for color:  A mixture of colors are present instead of an even brown color. D for diameter:  Melanomas are usually (but not always) greater than 6mm - the size of a pencil eraser. E for evolution:  The spot keeps changing in size, shape, and color.  Please check your skin once per month between visits. You can use a small mirror in front and a large mirror behind you to keep an eye on the back side or your body.   If you see any new or changing  lesions before your next follow-up, please call to schedule a visit.  Please continue daily skin protection including broad spectrum sunscreen SPF 30+ to sun-exposed areas, reapplying every 2 hours as needed when you're outdoors.   Staying in the shade or wearing long sleeves, sun glasses (UVA+UVB protection) and wide brim hats (4-inch brim around the entire circumference of the hat) are also recommended for sun protection.       Due to recent changes in healthcare laws, you may see results of your pathology and/or laboratory studies on MyChart before the doctors have had a chance to review them. We understand that in some cases there may be results that are confusing or concerning to you. Please understand that not all results are received at the same time and often the doctors may need to interpret multiple results in order to provide you with the best plan of care or course of treatment. Therefore, we ask that you please give [place] 2 business days to thoroughly review all your results before contacting the office for clarification. Should we see a critical lab result, you will be contacted sooner.   If You Need Anything After Your Visit  If you have any questions or concerns for your doctor, please call our main line at [phone] and press option 4 to reach your doctor's medical assistant. If no one answers, please leave a voicemail as directed and we  will return your call as soon as possible. Messages left after 4 pm will be answered the following business day.   You may also send [place] a message via MyChart. We typically respond to MyChart messages within 1-2 business days.  For prescription refills, please ask your pharmacy to contact our office. Our fax number is [phone].  If you have an urgent issue when the clinic is closed that cannot wait until the next business day, you can page your doctor at the number below.    Please note that while we do our best to be available for urgent  issues outside of office hours, we are not available 24/7.   If you have an urgent issue and are unable to reach [place], you may choose to seek medical care at your doctor's office, retail clinic, urgent care center, or emergency room.  If you have a medical emergency, please immediately call 911 or go to the emergency department.  Pager Numbers  - Dr. Gwen Pounds: [phone]  - Dr. Roseanne Reno: [phone]  - Dr. Katrinka Blazing: [phone]   In the event of inclement weather, please call our main line at [phone] for an update on the status of any delays or closures.  Dermatology Medication Tips: Please keep the boxes that topical medications come in in order to help keep track of the instructions about where and how to use these. Pharmacies typically print the medication instructions only on the boxes and not directly on the medication tubes.   If your medication is too expensive, please contact our office at [phone] option 4 or send [place] a message through MyChart.   We are unable to tell what your co-pay for medications will be in advance as this is different depending on your insurance coverage. However, we may be able to find a substitute medication at lower cost or fill out paperwork to get insurance to cover a needed medication.   If a prior authorization is required to get your medication covered by your insurance company, please allow [place] 1-2 business days to complete this process.  Drug prices often vary depending on where the prescription is filled and some pharmacies may offer cheaper prices.  The website www.goodrx.com contains coupons for medications through different pharmacies. The prices here do not account for what the cost may be with help from insurance (it may be cheaper with your insurance), but the website can give you the price if you did not use any insurance.  - You can print the associated coupon and take it with your prescription to the pharmacy.  - You may also stop  by our office during regular business hours and pick up a GoodRx coupon card.  - If you need your prescription sent electronically to a different pharmacy, notify our office through [facility] or by phone at [phone] option 4.     Si Usted Necesita Algo Despus de Su Visita  Tambin puede enviarnos un mensaje a travs de Clinical cytogeneticist. Por lo general respondemos a los mensajes de MyChart en el transcurso de 1 a 2 das hbiles.  Para renovar recetas, por favor pida a su farmacia que se ponga en contacto con nuestra oficina. Annie Sable de fax es [place] [phone].  Si tiene un asunto urgente cuando la clnica est cerrada y que no puede esperar hasta el siguiente da hbil, puede llamar/localizar a su doctor(a) al nmero que aparece a continuacin.   Por favor, tenga en cuenta que aunque hacemos todo lo posible para estar disponibles  para asuntos urgentes fuera del horario de [place], no estamos disponibles las 24 horas del da, los 7 [address]  [address] de la [place].   Si tiene un problema urgente y no puede comunicarse con nosotros, puede optar por buscar atencin mdica  en el consultorio de su doctor(a), en una clnica privada, en un centro de atencin urgente o en una sala de emergencias.  Si tiene Engineer, drilling, por favor llame inmediatamente al 911 o vaya a la sala de emergencias.  Nmeros de bper  - Dr. Gwen Pounds: [phone]  - Dra. Roseanne Reno: [phone]  - Dr. Katrinka Blazing: [phone]   En caso de inclemencias del tiempo, por favor llame a Lacy Duverney principal al [phone] para una actualizacin sobre el [place] de cualquier retraso o cierre.  Consejos para la medicacin en dermatologa: Por favor, guarde las cajas en las que vienen los medicamentos de uso tpico para ayudarle a seguir las instrucciones sobre dnde y cmo usarlos. Las farmacias generalmente imprimen las instrucciones del medicamento slo en las cajas y no directamente en los tubos del [place].   Si su  medicamento es muy caro, por favor, pngase en contacto con Rolm Gala llamando al [phone] y presione la opcin 4 o envenos un mensaje a travs de Clinical cytogeneticist.   No podemos decirle cul ser su copago por los medicamentos por adelantado ya que esto es diferente dependiendo de la cobertura de su seguro. Sin embargo, es posible que podamos encontrar un medicamento sustituto a Audiological scientist un formulario para que el seguro cubra el medicamento que se considera necesario.   Si se requiere una autorizacin previa para que su compaa de seguros [place] su medicamento, por favor permtanos de 1 a 2 das hbiles para completar [address]  Los precios de los medicamentos varan con frecuencia dependiendo del Environmental consultant de dnde se surte la receta y alguna farmacias pueden ofrecer precios ms baratos.  El sitio web www.goodrx.com tiene cupones para medicamentos de Health and safety inspector. Los precios aqu no tienen en cuenta lo que podra costar con la ayuda del seguro (puede ser ms barato con su seguro), pero el sitio web puede darle el precio si no utiliz Tourist information centre manager.  - Puede imprimir el cupn correspondiente y llevarlo con su receta a la farmacia.  - Tambin puede pasar por nuestra oficina durante el horario de atencin regular y Education officer, museum una tarjeta de cupones de GoodRx.  - Si necesita que su receta se enve electrnicamente a una farmacia diferente, informe a nuestra oficina a travs de MyChart de [place] o por telfono llamando al [phone] y presione la opcin 4.

## 2024-01-04 NOTE — Telephone Encounter (Signed)
 Noted.

## 2024-01-06 ENCOUNTER — Encounter: Payer: Self-pay | Admitting: Sleep Medicine

## 2024-01-06 ENCOUNTER — Ambulatory Visit: Admitting: Sleep Medicine

## 2024-01-06 VITALS — BP 122/74 | HR 77 | Resp 18 | Ht 73.0 in | Wt 237.6 lb

## 2024-01-06 DIAGNOSIS — G4733 Obstructive sleep apnea (adult) (pediatric): Secondary | ICD-10-CM | POA: Diagnosis not present

## 2024-01-06 DIAGNOSIS — Z6831 Body mass index (BMI) 31.0-31.9, adult: Secondary | ICD-10-CM

## 2024-01-06 DIAGNOSIS — E119 Type 2 diabetes mellitus without complications: Secondary | ICD-10-CM | POA: Diagnosis not present

## 2024-01-06 DIAGNOSIS — E66811 Obesity, class 1: Secondary | ICD-10-CM

## 2024-01-06 NOTE — Progress Notes (Signed)
 Name:Jackson [name] MRN: [id number] DOB: June 18, 1951   CHIEF COMPLAINT:  ESTABLISH CARE FOR OSA   HISTORY OF PRESENT ILLNESS:  [name] is a 73 y.o. w/ a h/o OSA, DMII, GERD and obesity who presents to establish care for OSA. Reports that he was initially diagnosed with OSA around 30 years ago and subsequently started on CPAP therapy. Reports using CPAP therapy every night, which is confirmed by compliance data. He is currently using the Quattro FX FFM. Denies any significant weight changes. Denies snoring with CPAP therapy. Reports feeling unrefreshed upon awakening with CPAP therapy and frequently falls asleep during the day. Reports nocturnal awakenings due to nocturia, however does not have difficulty falling back to sleep. Admits to dry mouth. Denies morning headaches, night sweats, RLS symptoms, dream enactment, cataplexy, hypnagogic or hypnapompic hallucinations. Denies a family history of sleep apnea. Denies drowsy driving. Drinks 1-2 energy drinks daily, occasional alcohol use, denies tobacco or illicit drug use.   Bedtime 1 am Sleep onset 15 mins Rise time 11 am   EPWORTH SLEEP SCORE 13    01/06/2024    3:01 PM  Results of the Epworth flowsheet  Sitting and reading 2  Watching TV 2  Sitting, inactive in a public place (e.g. a theatre or a meeting) 1  As a passenger in a car for an hour without a break 2  Lying down to rest in the afternoon when circumstances permit 3  Sitting and talking to someone 1  Sitting quietly after a lunch without alcohol 2  In a car, while stopped for a few minutes in traffic 0  Total score 13     PAST MEDICAL HISTORY :   has a past medical history of Actinic keratosis, Allergy, Anemia, Arthritis, Basal cell carcinoma, Basal cell carcinoma (11/17/2022), Basal cell carcinoma (02/17/2023), BCC (basal cell carcinoma) (06/03/2022), BCC (basal cell carcinoma) (06/03/2022), Bradycardia (03/03/2018), Cancer (HCC), Cholecystitis, Complication  of anesthesia, Diabetes mellitus without complication (HCC), Dysplastic nevus (07/05/2023), Dysrhythmia, Elevated PSA (06/13/2019), Fatty liver, GERD (gastroesophageal reflux disease), Hyperlipidemia, Melanoma (HCC) (02/02/2019), Multiple thyroid nodules, Paroxysmal SVT (supraventricular tachycardia) (HCC) (05/27/2016), Rheumatic fever, Sleep apnea (1997), SVT (supraventricular tachycardia) (HCC), and Thyroid nodule.  has a past surgical history that includes Appendectomy (1965); Tonsillectomy (1956); Biceps tendon repair (Right, 05/2015); Rhinoplasty (1997); Vasectomy (1981); Lumbar fusion (2015); Colonoscopy (2016); Shoulder surgery (Left); Spine surgery (06/2013); Cholecystectomy (03/2021); and Hernia repair (6/22). Prior to Admission medications   Medication Sig Start Date End Date Taking? Authorizing Provider  azelastine (ASTELIN) 0.1 % nasal spray Place 1 spray into both nostrils 2 (two) times daily. Use in each nostril as directed 07/27/23  Yes Dana Allan, MD  Coenzyme Q10 (COQ10) 200 MG CAPS Take 200 mg by mouth daily.    Yes [provider]  Continuous Glucose Sensor (FREESTYLE LIBRE 2 SENSOR) MISC USE TO CHECK BLOOD SUGAR EVERY 8 HOURS 11/04/23  Yes Dana Allan, MD  Dulaglutide (TRULICITY) 0.75 MG/0.5ML SOPN INJECT 0.5 ML (0.75 MG TOTAL) UNDER THE SKIN ONCE A WEEK 07/27/23  Yes Dana Allan, MD  fexofenadine (ALLEGRA) 60 MG tablet TAKE 1 TABLET TWICE A DAY 07/08/23  Yes Dana Allan, MD  finasteride (PROSCAR) 5 MG tablet Take 1 tablet (5 mg total) by mouth daily. 09/30/23  Yes Sondra Come, MD  metoprolol succinate (TOPROL-XL) 50 MG 24 hr tablet TAKE 1 TABLET DAILY WITH OR IMMEDIATELY FOLLOWING A MEAL (DOSE CHANGE) 04/16/23  Yes Dana Allan, MD  Multiple Vitamins-Minerals (MULTIVITAMIN ADULT  PO) Take 1 tablet by mouth daily.    Yes [provider]  mupirocin ointment (BACTROBAN) 2 % Apply 1 Application topically daily. 08/04/23  Yes Elie Goody, MD  Omega-3 Fatty Acids  (FISH OIL) 1200 MG CPDR Take 1 tablet by mouth daily.   Yes [provider]  omeprazole (PRILOSEC) 40 MG capsule Take 1 capsule (40 mg total) by mouth daily. 30 min before food 07/05/23  Yes Dana Allan, MD  tamsulosin (FLOMAX) 0.4 MG CAPS capsule Take 1 capsule (0.4 mg total) by mouth at bedtime. 09/30/23  Yes Sondra Come, MD  vitamin C (ASCORBIC ACID) 500 MG tablet Take 1,000 mg by mouth daily.    Yes [provider]  Vitamin E 400 units TABS Take 400 Units by mouth daily.    Yes [provider]   Allergies  Allergen Reactions   Januvia [Sitagliptin]     Kidney pain    Lipitor [Atorvastatin]     10 mg ?myalgias   Statins Other (See Comments)    Myalgias; atorvastatin 10 mg, pravastatin 10 mg, rosuvastatin w/ unknown dose.     FAMILY HISTORY:  family history includes ADD / ADHD in his daughter; Alcohol abuse in his maternal grandfather; Arthritis in his father and mother; Cholelithiasis in his brother, daughter, and mother; Diabetes in his father and mother; Drug abuse in his cousin; Hyperlipidemia in his maternal grandfather, maternal grandmother, mother, and another family member; Lung cancer in his mother; Lung disease in his father; Pancreatitis in his father. SOCIAL HISTORY:  reports that he has never smoked. He has quit using smokeless tobacco.  His smokeless tobacco use included snuff. He reports current alcohol use of about 1.0 - 2.0 standard drink of alcohol per week. He reports that he does not use drugs.   Review of Systems:  Gen:  Denies  fever, sweats, chills weight loss  HEENT: Denies blurred vision, double vision, ear pain, eye pain, hearing loss, nose bleeds, sore throat Cardiac:  No dizziness, chest pain or heaviness, chest tightness,edema, No JVD Resp:   No cough, -sputum production, -shortness of breath,-wheezing, -hemoptysis,  Gi: Denies swallowing difficulty, stomach pain, nausea or vomiting, diarrhea, constipation, bowel  incontinence Gu:  Denies bladder incontinence, burning urine Ext:   Denies Joint pain, stiffness or swelling Skin: Denies  skin rash, easy bruising or bleeding or hives Endoc:  Denies polyuria, polydipsia , polyphagia or weight change Psych:   Denies depression, insomnia or hallucinations  Other:  All other systems negative  VITAL SIGNS: BP 122/74   Pulse 77   Resp 18   Ht 6\' 1"  (1.854 m)   Wt 237 lb 9.6 oz (107.8 kg)   SpO2 96%   BMI 31.35 kg/m    Physical Examination:   General Appearance: No distress  EYES PERRLA, EOM intact.   NECK Supple, No JVD Pulmonary: normal breath sounds, No wheezing.  CardiovascularNormal S1,S2.  No m/r/g.   Abdomen: Benign, Soft, non-tender. Skin:   warm, no rashes, no ecchymosis  Extremities: normal, no cyanosis, clubbing. Neuro:without focal findings,  speech normal  PSYCHIATRIC: Mood, affect within normal limits.   ASSESSMENT AND PLAN  OSA Patient is using and benefiting from CPAP therapy. However, due to mild residual sleep apnea and hypersomnia with CPAP therapy, will complete CPAP/BIPAP titration study. Discussed the consequences of untreated sleep apnea. Advised not to drive drowsy for safety of patient and others. Will follow up to review CPAP/BIPAP titration study and follow up to review results.  DMII Stable, on current management.    Obesity Counseled patient on diet and lifestyle modification.    MEDICATION ADJUSTMENTS/LABS AND TESTS ORDERED: Recommend CPAP Titration Study   Patient  satisfied with Plan of action and management. All questions answered  I spent a total of 48 minutes reviewing chart data, face-to-face evaluation with the patient, counseling and coordination of care as detailed above.    Tempie Hoist, M.D.  [facility]

## 2024-01-06 NOTE — Patient Instructions (Signed)

## 2024-01-07 DIAGNOSIS — S46811A Strain of other muscles, fascia and tendons at shoulder and upper arm level, right arm, initial encounter: Secondary | ICD-10-CM | POA: Diagnosis not present

## 2024-01-11 ENCOUNTER — Ambulatory Visit: Attending: Otolaryngology

## 2024-01-11 DIAGNOSIS — G4761 Periodic limb movement disorder: Secondary | ICD-10-CM | POA: Insufficient documentation

## 2024-01-11 DIAGNOSIS — I493 Ventricular premature depolarization: Secondary | ICD-10-CM | POA: Insufficient documentation

## 2024-01-11 DIAGNOSIS — G4719 Other hypersomnia: Secondary | ICD-10-CM | POA: Insufficient documentation

## 2024-01-11 DIAGNOSIS — G4733 Obstructive sleep apnea (adult) (pediatric): Secondary | ICD-10-CM | POA: Insufficient documentation

## 2024-01-18 ENCOUNTER — Encounter (HOSPITAL_COMMUNITY): Payer: Self-pay

## 2024-01-18 ENCOUNTER — Telehealth: Payer: Self-pay | Admitting: Sleep Medicine

## 2024-01-18 DIAGNOSIS — G4733 Obstructive sleep apnea (adult) (pediatric): Secondary | ICD-10-CM

## 2024-01-18 NOTE — Telephone Encounter (Signed)
 Patient received a call for another sleep study but he just had one earlier this month. He is wondering if  a new sleep study is needed. Please call and advise.

## 2024-01-18 NOTE — Telephone Encounter (Signed)
 See second telephone encounter from today(3/25).  Closing this encounter.

## 2024-01-18 NOTE — Telephone Encounter (Signed)
 Has sent a mychart message asking about a new sleep study.

## 2024-01-18 NOTE — Telephone Encounter (Signed)
 I spoke with the patient. He said he has was suppose to have a CPAP Titration study done on 3/18. He went in but they did not put in on a CPAP. He said they are now calling him to do another study.   Can you verify that he needs to have another study done? I have the sleep study results at my desk.

## 2024-01-20 ENCOUNTER — Ambulatory Visit
Admission: RE | Admit: 2024-01-20 | Discharge: 2024-01-20 | Disposition: A | Discharge status: Home / Self Care | Payer: Medicare Other | Source: Ambulatory Visit | Attending: Physician Assistant | Admitting: Physician Assistant

## 2024-01-20 DIAGNOSIS — I7121 Aneurysm of the ascending aorta, without rupture: Secondary | ICD-10-CM | POA: Insufficient documentation

## 2024-01-20 DIAGNOSIS — M6281 Muscle weakness (generalized): Secondary | ICD-10-CM | POA: Diagnosis not present

## 2024-01-20 DIAGNOSIS — M25611 Stiffness of right shoulder, not elsewhere classified: Secondary | ICD-10-CM | POA: Diagnosis not present

## 2024-01-20 DIAGNOSIS — R079 Chest pain, unspecified: Secondary | ICD-10-CM

## 2024-01-20 DIAGNOSIS — M47814 Spondylosis without myelopathy or radiculopathy, thoracic region: Secondary | ICD-10-CM | POA: Diagnosis not present

## 2024-01-20 DIAGNOSIS — M75121 Complete rotator cuff tear or rupture of right shoulder, not specified as traumatic: Secondary | ICD-10-CM | POA: Diagnosis not present

## 2024-01-20 LAB — NM PET CT CARDIAC PERFUSION MULTI W/ABSOLUTE BLOODFLOW
LV dias vol: 107 mL (ref 62–150)
MBFR: 3.11
Nuc Rest EF: 58 %
Nuc Stress EF: 65 %
Peak HR: 78 {beats}/min
Rest HR: 57 {beats}/min
Rest MBF: 0.56 ml/g/min
Rest Nuclear Isotope Dose: 25 mCi
Rest perfusion cavity size (mL): 107 mL
SRS: 1
SSS: 0
ST Depression (mm): 0 mm
Stress MBF: 1.74 ml/g/min
Stress Nuclear Isotope Dose: 24.1 mCi
Stress perfusion cavity size (mL): 118 mL
TID: 1.12

## 2024-01-20 MED ORDER — REGADENOSON 0.4 MG/5ML IV SOLN
0.4000 mg | Freq: Once | INTRAVENOUS | Status: AC
  Administered 2024-01-20: 0.4 mg via INTRAVENOUS
  Filled 2024-01-20: qty 5

## 2024-01-20 MED ORDER — RUBIDIUM RB82 GENERATOR (RUBYFILL)
25.1000 | PACK | Freq: Once | INTRAVENOUS | Status: AC
Start: 2024-01-20 — End: 2024-01-20
  Administered 2024-01-20: 25.1 via INTRAVENOUS

## 2024-01-20 MED ORDER — RUBIDIUM RB82 GENERATOR (RUBYFILL)
24.9900 | PACK | Freq: Once | INTRAVENOUS | Status: AC
  Administered 2024-01-20: 24.99 via INTRAVENOUS

## 2024-01-20 MED ORDER — REGADENOSON 0.4 MG/5ML IV SOLN
INTRAVENOUS | Status: AC
  Filled 2024-01-20: qty 5

## 2024-01-20 NOTE — Telephone Encounter (Signed)
 I have notified the patient. He is ok with the plan. He said he does need CPAP supplies ordered.   Okay to place an order for CPAP Supplies?

## 2024-01-21 DIAGNOSIS — G4733 Obstructive sleep apnea (adult) (pediatric): Secondary | ICD-10-CM

## 2024-01-21 NOTE — Telephone Encounter (Signed)
Order has been placed and I have notified the patient.  Nothing further needed.

## 2024-01-24 ENCOUNTER — Telehealth (INDEPENDENT_AMBULATORY_CARE_PROVIDER_SITE_OTHER): Payer: Self-pay | Admitting: Sleep Medicine

## 2024-01-24 ENCOUNTER — Other Ambulatory Visit: Payer: Self-pay | Admitting: Emergency Medicine

## 2024-01-24 DIAGNOSIS — Q254 Congenital malformation of aorta unspecified: Secondary | ICD-10-CM

## 2024-01-24 NOTE — Telephone Encounter (Signed)
 PSG revealed moderate OSA (AHI 28, O2 nadir 87%).

## 2024-02-01 DIAGNOSIS — M25611 Stiffness of right shoulder, not elsewhere classified: Secondary | ICD-10-CM | POA: Diagnosis not present

## 2024-02-01 DIAGNOSIS — M75121 Complete rotator cuff tear or rupture of right shoulder, not specified as traumatic: Secondary | ICD-10-CM | POA: Diagnosis not present

## 2024-02-01 DIAGNOSIS — M6281 Muscle weakness (generalized): Secondary | ICD-10-CM | POA: Diagnosis not present

## 2024-02-08 DIAGNOSIS — M75121 Complete rotator cuff tear or rupture of right shoulder, not specified as traumatic: Secondary | ICD-10-CM | POA: Diagnosis not present

## 2024-02-08 DIAGNOSIS — M6281 Muscle weakness (generalized): Secondary | ICD-10-CM | POA: Diagnosis not present

## 2024-02-08 DIAGNOSIS — M25611 Stiffness of right shoulder, not elsewhere classified: Secondary | ICD-10-CM | POA: Diagnosis not present

## 2024-02-15 DIAGNOSIS — M75121 Complete rotator cuff tear or rupture of right shoulder, not specified as traumatic: Secondary | ICD-10-CM | POA: Diagnosis not present

## 2024-02-15 DIAGNOSIS — M6281 Muscle weakness (generalized): Secondary | ICD-10-CM | POA: Diagnosis not present

## 2024-02-15 DIAGNOSIS — M25611 Stiffness of right shoulder, not elsewhere classified: Secondary | ICD-10-CM | POA: Diagnosis not present

## 2024-02-24 ENCOUNTER — Other Ambulatory Visit: Payer: Self-pay | Admitting: Family Medicine

## 2024-02-24 DIAGNOSIS — I471 Supraventricular tachycardia, unspecified: Secondary | ICD-10-CM

## 2024-02-24 DIAGNOSIS — M25611 Stiffness of right shoulder, not elsewhere classified: Secondary | ICD-10-CM | POA: Diagnosis not present

## 2024-02-24 DIAGNOSIS — M6281 Muscle weakness (generalized): Secondary | ICD-10-CM | POA: Diagnosis not present

## 2024-02-24 DIAGNOSIS — M75121 Complete rotator cuff tear or rupture of right shoulder, not specified as traumatic: Secondary | ICD-10-CM | POA: Diagnosis not present

## 2024-02-24 DIAGNOSIS — E1159 Type 2 diabetes mellitus with other circulatory complications: Secondary | ICD-10-CM

## 2024-02-29 DIAGNOSIS — M6281 Muscle weakness (generalized): Secondary | ICD-10-CM | POA: Diagnosis not present

## 2024-02-29 DIAGNOSIS — M25611 Stiffness of right shoulder, not elsewhere classified: Secondary | ICD-10-CM | POA: Diagnosis not present

## 2024-02-29 DIAGNOSIS — M75121 Complete rotator cuff tear or rupture of right shoulder, not specified as traumatic: Secondary | ICD-10-CM | POA: Diagnosis not present

## 2024-03-03 DIAGNOSIS — M75121 Complete rotator cuff tear or rupture of right shoulder, not specified as traumatic: Secondary | ICD-10-CM | POA: Diagnosis not present

## 2024-04-05 DIAGNOSIS — M75121 Complete rotator cuff tear or rupture of right shoulder, not specified as traumatic: Secondary | ICD-10-CM | POA: Diagnosis not present

## 2024-04-06 ENCOUNTER — Telehealth: Payer: Self-pay | Admitting: Family Medicine

## 2024-04-06 NOTE — Telephone Encounter (Signed)
 Printed out and placed in provider folder

## 2024-04-06 NOTE — Telephone Encounter (Signed)
 PreOp risk assessment is upfront in Dr Freeda Jerry color folder, also sent copy to S drive.

## 2024-04-10 ENCOUNTER — Telehealth: Payer: Self-pay

## 2024-04-10 NOTE — Telephone Encounter (Signed)
 Called pt to get him scheduled for surgical clearance. Please schedule with a provider as his PCP will be leaving this week.

## 2024-04-13 NOTE — Telephone Encounter (Signed)
 Called pt and left a message to call the office back to schedule an pre-op appointment.

## 2024-05-18 ENCOUNTER — Other Ambulatory Visit: Payer: Self-pay

## 2024-05-18 ENCOUNTER — Ambulatory Visit: Payer: Self-pay | Admitting: Physician Assistant

## 2024-05-18 ENCOUNTER — Ambulatory Visit
Admission: RE | Admit: 2024-05-18 | Discharge: 2024-05-18 | Disposition: A | Discharge status: Home / Self Care | Source: Ambulatory Visit | Attending: Physician Assistant | Admitting: Physician Assistant

## 2024-05-18 DIAGNOSIS — Q254 Congenital malformation of aorta unspecified: Secondary | ICD-10-CM | POA: Diagnosis not present

## 2024-05-18 DIAGNOSIS — K219 Gastro-esophageal reflux disease without esophagitis: Secondary | ICD-10-CM

## 2024-05-18 LAB — POCT I-STAT CREATININE: Creatinine, Ser: 1.2 mg/dL (ref 0.61–1.24)

## 2024-05-18 MED ORDER — OMEPRAZOLE 40 MG PO CPDR: 40.0000 mg | DELAYED_RELEASE_CAPSULE | Freq: Every day | ORAL | 3 refills | Status: AC

## 2024-05-18 MED ORDER — IOHEXOL 350 MG/ML SOLN
75.0000 mL | Freq: Once | INTRAVENOUS | Status: AC | PRN
Start: 2024-05-18 — End: 2024-05-18
  Administered 2024-05-18: 75 mL via INTRAVENOUS

## 2024-05-23 ENCOUNTER — Ambulatory Visit: Payer: Self-pay | Admitting: Physician Assistant

## 2024-05-23 ENCOUNTER — Encounter: Payer: Self-pay | Admitting: Cardiovascular Disease

## 2024-05-23 DIAGNOSIS — I3139 Other pericardial effusion (noninflammatory): Secondary | ICD-10-CM

## 2024-05-30 ENCOUNTER — Encounter: Payer: Self-pay | Admitting: Dermatology

## 2024-05-30 ENCOUNTER — Ambulatory Visit: Admitting: Dermatology

## 2024-05-30 DIAGNOSIS — L820 Inflamed seborrheic keratosis: Secondary | ICD-10-CM | POA: Diagnosis not present

## 2024-05-30 NOTE — Progress Notes (Signed)
   Follow-Up Visit   Subjective  Jackson [name] is a 73 y.o. male who presents for the following: Spots on left scalp and back that patient is concerned about; itchy but not painful. Hx of BCC and Melanoma.   The following portions of the chart were reviewed this encounter and updated as appropriate: medications, allergies, medical history  Review of Systems:  No other skin or systemic complaints except as noted in HPI or Assessment and Plan.  Objective  Well appearing patient in no apparent distress; mood and affect are within normal limits.   A focused examination was performed of the following areas: Face and back.   Relevant exam findings are noted in the Assessment and Plan.  R superior forehead x5, L temple/scalp x3, R chest x1 (9) Erythematous stuck-on, waxy papule or plaque  Assessment & Plan   SEBORRHEIC KERATOSIS - Stuck-on, waxy, tan-brown papules and/or plaques  - Benign-appearing - Discussed benign etiology and prognosis. - Observe - Call for any changes INFLAMED SEBORRHEIC KERATOSIS (9) R superior forehead x5, L temple/scalp x3, R chest x1 (9) Destruction of lesion - R superior forehead x5, L temple/scalp x3, R chest x1 (9) Complexity: simple   Destruction method: cryotherapy   Informed consent: discussed and consent obtained   Timeout:  patient name, date of birth, surgical site, and procedure verified Lesion destroyed using liquid nitrogen: Yes   Region frozen until ice ball extended beyond lesion: Yes   Cryo cycles: 1 or 2. Outcome: patient tolerated procedure well with no complications   Post-procedure details: wound care instructions given     Return if symptoms worsen or fail to improve.  I, Emerick Ege, CMA am acting as scribe for Boneta Sharps, MD.   Documentation: I have reviewed the above documentation for accuracy and completeness, and I agree with the above.  Boneta Sharps, MD

## 2024-05-30 NOTE — Patient Instructions (Signed)

## 2024-06-02 ENCOUNTER — Other Ambulatory Visit: Payer: Self-pay

## 2024-06-02 ENCOUNTER — Emergency Department
Admission: EM | Admit: 2024-06-02 | Discharge: 2024-06-02 | Disposition: A | Discharge status: Home / Self Care | Attending: Emergency Medicine | Admitting: Emergency Medicine

## 2024-06-02 ENCOUNTER — Emergency Department

## 2024-06-02 DIAGNOSIS — R059 Cough, unspecified: Secondary | ICD-10-CM | POA: Insufficient documentation

## 2024-06-02 DIAGNOSIS — R0789 Other chest pain: Secondary | ICD-10-CM | POA: Diagnosis not present

## 2024-06-02 DIAGNOSIS — E119 Type 2 diabetes mellitus without complications: Secondary | ICD-10-CM | POA: Insufficient documentation

## 2024-06-02 DIAGNOSIS — R918 Other nonspecific abnormal finding of lung field: Secondary | ICD-10-CM | POA: Diagnosis not present

## 2024-06-02 LAB — TROPONIN I (HIGH SENSITIVITY): Troponin I (High Sensitivity): 5 ng/L (ref <18)

## 2024-06-02 LAB — CBC
HCT: 44.1 % (ref 39.0–52.0)
Hemoglobin: 15.3 g/dL (ref 13.0–17.0)
MCH: 31 pg (ref 26.0–34.0)
MCHC: 34.7 g/dL (ref 30.0–36.0)
MCV: 89.3 fL (ref 80.0–100.0)
Platelets: 193 K/uL (ref 150–400)
RBC: 4.94 MIL/uL (ref 4.22–5.81)
RDW: 13.2 % (ref 11.5–15.5)
WBC: 8.6 K/uL (ref 4.0–10.5)
nRBC: 0 % (ref 0.0–0.2)

## 2024-06-02 LAB — BASIC METABOLIC PANEL WITH GFR
Anion gap: 11 (ref 5–15)
BUN: 23 mg/dL (ref 8–23)
CO2: 21 mmol/L — ABNORMAL LOW (ref 22–32)
Calcium: 9.1 mg/dL (ref 8.9–10.3)
Chloride: 106 mmol/L (ref 98–111)
Creatinine, Ser: 1.16 mg/dL (ref 0.61–1.24)
GFR, Estimated: >60 mL/min (ref >60)
Glucose, Bld: 190 mg/dL — ABNORMAL HIGH (ref 70–99)
Potassium: 4.2 mmol/L (ref 3.5–5.1)
Sodium: 138 mmol/L (ref 135–145)

## 2024-06-02 MED ORDER — MELOXICAM 15 MG PO TABS: 15.0000 mg | ORAL_TABLET | Freq: Every day | ORAL | 2 refills | Status: AC

## 2024-06-02 NOTE — ED Triage Notes (Signed)
 Pt to ed from home via POV for Chest wall pain. Pt states I coughed hard on Wednesday and have been in pain since. Pt is caox4, in no acute distress and ambulatory in triage. Pt is worried about a possible AAA that he had on one CT and then a repeat CT he didn't have it anymore. Pt is scheduled for an echo soon.

## 2024-06-02 NOTE — ED Provider Notes (Signed)
 [facility] Provider Note    Event Date/Time   First MD Initiated Contact with Patient 06/02/24 1947     (approximate)   History   Chest Pain   HPI  Jackson [name] is a 73 y.o. male with history of diabetes, hyperlipidemia who presents with complaints of chest discomfort.  He notes he coughed very hard on Wednesday and immediately thereafter had discomfort in the left lower chest, that has continued to be uncomfortable for him.  He reports it does not hurt if he takes a deep breath but if he coughs it does hurt.  No shortness of breath.  No fevers or chills.  No nausea or vomiting     Physical Exam   Triage Vital Signs: ED Triage Vitals  Encounter Vitals Group     BP 06/02/24 1732 (!) 146/79     Girls Systolic BP Percentile --      Girls Diastolic BP Percentile --      Boys Systolic BP Percentile --      Boys Diastolic BP Percentile --      Pulse Rate 06/02/24 1732 89     Resp 06/02/24 1732 18     Temp 06/02/24 1732 98.4 F (36.9 C)     Temp Source 06/02/24 1732 Oral     SpO2 06/02/24 1732 97 %     Weight --      Height 06/02/24 1728 1.854 m (6' 1)     Head Circumference --      Peak Flow --      Pain Score 06/02/24 1728 5     Pain Loc --      Pain Education --      Exclude from Growth Chart --     Most recent vital signs: Vitals:   06/02/24 1732 06/02/24 2000  BP: (!) 146/79 138/79  Pulse: 89 66  Resp: 18 18  Temp: 98.4 F (36.9 C)   SpO2: 97% 98%     General: Awake, no distress.  CV:  Good peripheral perfusion.  Patient has point tenderness just lateral of center in the lower chest wall.  This seems to replicate his pain exactly Resp:  Normal effort.  Auscultation bilaterally Abd:  No distention.  Soft, nontender, no epigastric tenderness. Other:  No calf pain or swelling   ED Results / Procedures / Treatments   Labs (all labs ordered are listed, but only abnormal results are displayed) Labs Reviewed  BASIC METABOLIC  PANEL WITH GFR - Abnormal; Notable for the following components:      Result Value   CO2 21 (*)    Glucose, Bld 190 (*)    All other components within normal limits  CBC  TROPONIN I (HIGH SENSITIVITY)  TROPONIN I (HIGH SENSITIVITY)     EKG  ED ECG REPORT I, Lamar Price, the attending physician, personally viewed and interpreted this ECG.  Date: 06/02/2024  Rhythm: normal sinus rhythm QRS Axis: normal Intervals: normal ST/T Wave abnormalities: normal Narrative Interpretation: no evidence of acute ischemia    RADIOLOGY Chest x-ray viewed interpret by me, no acute abnormality, confirmed radiology    PROCEDURES:  Critical Care performed:   Procedures   MEDICATIONS ORDERED IN ED: Medications - No data to display   IMPRESSION / MDM / ASSESSMENT AND PLAN / ED COURSE  I reviewed the triage vital signs and the nursing notes. Patient's presentation is most consistent with acute presentation with potential threat to life or bodily function.  Patient resents with chest pain as detailed above, given that this occurred after coughing suspect musculoskeletal chest pain however pneumothorax is certainly a possibility, ACS is on the differential as  EKG, high sensitive troponin are normal  Chest x-ray without evidence of pneumothorax or rib fracture or pneumonia  Given exam this appears to be consistent with chest wall pain, will treat the patient with meloxicam , outpatient follow-up, strict return precautions, he and his wife agree with his plan        FINAL CLINICAL IMPRESSION(S) / ED DIAGNOSES   Final diagnoses:  Chest wall pain     Rx / DC Orders   ED Discharge Orders          Ordered    meloxicam  (MOBIC ) 15 MG tablet  Daily        06/02/24 2007             Note:  This document was prepared using Dragon voice recognition software and may include unintentional dictation errors.   Arlander Charleston, MD 06/02/24 2330

## 2024-06-08 ENCOUNTER — Encounter: Payer: Self-pay | Admitting: Urology

## 2024-06-12 ENCOUNTER — Ambulatory Visit

## 2024-06-14 ENCOUNTER — Ambulatory Visit: Payer: Medicare Other | Admitting: Family Medicine

## 2024-06-14 ENCOUNTER — Encounter

## 2024-06-22 ENCOUNTER — Other Ambulatory Visit

## 2024-06-29 DIAGNOSIS — Z1331 Encounter for screening for depression: Secondary | ICD-10-CM | POA: Diagnosis not present

## 2024-06-29 DIAGNOSIS — E042 Nontoxic multinodular goiter: Secondary | ICD-10-CM | POA: Diagnosis not present

## 2024-07-06 ENCOUNTER — Encounter: Admitting: Dermatology

## 2024-07-18 DIAGNOSIS — E042 Nontoxic multinodular goiter: Secondary | ICD-10-CM | POA: Diagnosis not present

## 2024-07-24 ENCOUNTER — Ambulatory Visit (INDEPENDENT_AMBULATORY_CARE_PROVIDER_SITE_OTHER): Admitting: Dermatology

## 2024-07-24 ENCOUNTER — Encounter: Payer: Self-pay | Admitting: Dermatology

## 2024-07-24 DIAGNOSIS — L820 Inflamed seborrheic keratosis: Secondary | ICD-10-CM

## 2024-07-24 DIAGNOSIS — D229 Melanocytic nevi, unspecified: Secondary | ICD-10-CM

## 2024-07-24 DIAGNOSIS — L578 Other skin changes due to chronic exposure to nonionizing radiation: Secondary | ICD-10-CM | POA: Diagnosis not present

## 2024-07-24 DIAGNOSIS — B079 Viral wart, unspecified: Secondary | ICD-10-CM

## 2024-07-24 DIAGNOSIS — W908XXA Exposure to other nonionizing radiation, initial encounter: Secondary | ICD-10-CM | POA: Diagnosis not present

## 2024-07-24 DIAGNOSIS — Z86018 Personal history of other benign neoplasm: Secondary | ICD-10-CM | POA: Diagnosis not present

## 2024-07-24 DIAGNOSIS — L821 Other seborrheic keratosis: Secondary | ICD-10-CM

## 2024-07-24 DIAGNOSIS — L814 Other melanin hyperpigmentation: Secondary | ICD-10-CM

## 2024-07-24 DIAGNOSIS — Z85820 Personal history of malignant melanoma of skin: Secondary | ICD-10-CM | POA: Diagnosis not present

## 2024-07-24 DIAGNOSIS — Z85828 Personal history of other malignant neoplasm of skin: Secondary | ICD-10-CM | POA: Diagnosis not present

## 2024-07-24 DIAGNOSIS — D1801 Hemangioma of skin and subcutaneous tissue: Secondary | ICD-10-CM

## 2024-07-24 DIAGNOSIS — Z1283 Encounter for screening for malignant neoplasm of skin: Secondary | ICD-10-CM | POA: Diagnosis not present

## 2024-07-24 DIAGNOSIS — L570 Actinic keratosis: Secondary | ICD-10-CM | POA: Diagnosis not present

## 2024-07-24 NOTE — Progress Notes (Signed)
 Follow-Up Visit   Subjective  Jackson [name] is a 73 y.o. male who presents for the following: Skin Cancer Screening and Full Body Skin Exam; hx of melanoma, BCC, dysplastic nevus. Patient has areas of concern on his neck and his back.   The patient presents for Total-Body Skin Exam (TBSE) for skin cancer screening and mole check. The patient has spots, moles and lesions to be evaluated, some may be new or changing and the patient may have concern these could be cancer.    The following portions of the chart were reviewed this encounter and updated as appropriate: medications, allergies, medical history  Review of Systems:  No other skin or systemic complaints except as noted in HPI or Assessment and Plan.  Objective  Well appearing patient in no apparent distress; mood and affect are within normal limits.  A full examination was performed including scalp, head, eyes, ears, nose, lips, neck, chest, axillae, abdomen, back, buttocks, bilateral upper extremities, bilateral lower extremities, hands, feet, fingers, toes, fingernails, and toenails. All findings within normal limits unless otherwise noted below.   Relevant physical exam findings are noted in the Assessment and Plan.  Right Lower Back x1, Left jawline x1, left post aricular x1, right post auricular x1, Left lateral shoulder x1 (5) Stuck on waxy paps with erythema Left temple x1 Pink scaly macules Left Anterior Neck Verrucous papules     Assessment & Plan   SKIN CANCER SCREENING PERFORMED TODAY.  ACTINIC DAMAGE - Chronic condition, secondary to cumulative UV/sun exposure - diffuse scaly erythematous macules with underlying dyspigmentation - Recommend daily broad spectrum sunscreen SPF 30+ to sun-exposed areas, reapply every 2 hours as needed.  - Staying in the shade or wearing long sleeves, sun glasses (UVA+UVB protection) and wide brim hats (4-inch brim around the entire circumference of the hat) are also recommended  for sun protection.  - Call for new or changing lesions.  LENTIGINES, SEBORRHEIC KERATOSES, HEMANGIOMAS - Benign normal skin lesions - Benign-appearing - Call for any changes  MELANOCYTIC NEVI - Tan-brown and/or pink-flesh-colored symmetric macules and papules - Benign appearing on exam today - Observation - Call clinic for new or changing moles - Recommend daily use of broad spectrum spf 30+ sunscreen to sun-exposed areas.   HISTORY OF MELANOMA - No evidence of recurrence today - No lymphadenopathy - Recommend regular full body skin exams - Recommend daily broad spectrum sunscreen SPF 30+ to sun-exposed areas, reapply every 2 hours as needed.  - Call if any new or changing lesions are noted between office visits  HISTORY OF BASAL CELL CARCINOMA OF THE SKIN - No evidence of recurrence today - Recommend regular full body skin exams - Recommend daily broad spectrum sunscreen SPF 30+ to sun-exposed areas, reapply every 2 hours as needed.  - Call if any new or changing lesions are noted between office visits  HISTORY OF DYSPLASTIC NEVUS No evidence of recurrence today Recommend regular full body skin exams Recommend daily broad spectrum sunscreen SPF 30+ to sun-exposed areas, reapply every 2 hours as needed.  Call if any new or changing lesions are noted between office visits  INFLAMED SEBORRHEIC KERATOSIS (5) Right Lower Back x1, Left jawline x1, left post aricular x1, right post auricular x1, Left lateral shoulder x1 (5) Symptomatic, irritating, patient would like treated. Destruction of lesion - Right Lower Back x1, Left jawline x1, left post aricular x1, right post auricular x1, Left lateral shoulder x1 (5) Complexity: simple   Destruction method: cryotherapy  Informed consent: discussed and consent obtained   Timeout:  patient name, date of birth, surgical site, and procedure verified Lesion destroyed using liquid nitrogen: Yes   Region frozen until ice ball extended  beyond lesion: Yes   Cryo cycles: 1 or 2. Outcome: patient tolerated procedure well with no complications   Post-procedure details: wound care instructions given   Additional details:  Prior to procedure, discussed risks of blister formation, small wound, skin dyspigmentation, or rare scar following cryotherapy. Recommend Vaseline ointment to treated areas while healing.   ACTINIC KERATOSIS Left temple x1 Actinic keratoses are precancerous spots that appear secondary to cumulative UV radiation exposure/sun exposure over time. They are chronic with expected duration over 1 year. A portion of actinic keratoses will progress to squamous cell carcinoma of the skin. It is not possible to reliably predict which spots will progress to skin cancer and so treatment is recommended to prevent development of skin cancer.  Recommend daily broad spectrum sunscreen SPF 30+ to sun-exposed areas, reapply every 2 hours as needed.  Recommend staying in the shade or wearing long sleeves, sun glasses (UVA+UVB protection) and wide brim hats (4-inch brim around the entire circumference of the hat). Call for new or changing lesions. Destruction of lesion - Left temple x1 Complexity: simple   Destruction method: cryotherapy   Informed consent: discussed and consent obtained   Timeout:  patient name, date of birth, surgical site, and procedure verified Lesion destroyed using liquid nitrogen: Yes   Region frozen until ice ball extended beyond lesion: Yes   Cryo cycles: 1 or 2. Outcome: patient tolerated procedure well with no complications   Post-procedure details: wound care instructions given   Additional details:  Prior to procedure, discussed risks of blister formation, small wound, skin dyspigmentation, or rare scar following cryotherapy. Recommend Vaseline ointment to treated areas while healing.   VERRUCA Left Anterior Neck Destruction of lesion - Left Anterior Neck Complexity: simple   Destruction method:  cryotherapy   Informed consent: discussed and consent obtained   Timeout:  patient name, date of birth, surgical site, and procedure verified Lesion destroyed using liquid nitrogen: Yes   Region frozen until ice ball extended beyond lesion: Yes   Cryo cycles: 1 or 2. Outcome: patient tolerated procedure well with no complications   Post-procedure details: wound care instructions given   Additional details:  Prior to procedure, discussed risks of blister formation, small wound, skin dyspigmentation, or rare scar following cryotherapy. Recommend Vaseline ointment to treated areas while healing.   MULTIPLE BENIGN NEVI   LENTIGINES   ACTINIC ELASTOSIS   SEBORRHEIC KERATOSES   CHERRY ANGIOMA   Return in about 6 months (around 01/21/2025) for TBSE.  I, Emerick Ege, CMA am acting as scribe for Boneta Sharps, MD.   Documentation: I have reviewed the above documentation for accuracy and completeness, and I agree with the above.  Boneta Sharps, MD

## 2024-07-24 NOTE — Patient Instructions (Addendum)
 Cryotherapy Aftercare  Wash gently with soap and water everyday.   Apply Vaseline and Band-Aid daily until healed.    Recommend daily broad spectrum sunscreen SPF 30+ to sun-exposed areas, reapply every 2 hours as needed. Call for new or changing lesions.  Staying in the shade or wearing long sleeves, sun glasses (UVA+UVB protection) and wide brim hats (4-inch brim around the entire circumference of the hat) are also recommended for sun protection.       Melanoma ABCDEs  Melanoma is the most dangerous type of skin cancer, and is the leading cause of death from skin disease.  You are more likely to develop melanoma if you: Have light-colored skin, light-colored eyes, or red or blond hair Spend a lot of time in the sun Tan regularly, either outdoors or in a tanning bed Have had blistering sunburns, especially during childhood Have a close family member who has had a melanoma Have atypical moles or large birthmarks  Early detection of melanoma is key since treatment is typically straightforward and cure rates are extremely high if we catch it early.   The first sign of melanoma is often a change in a mole or a new dark spot.  The ABCDE system is a way of remembering the signs of melanoma.  A for asymmetry:  The two halves do not match. B for border:  The edges of the growth are irregular. C for color:  A mixture of colors are present instead of an even brown color. D for diameter:  Melanomas are usually (but not always) greater than 6mm - the size of a pencil eraser. E for evolution:  The spot keeps changing in size, shape, and color.  Please check your skin once per month between visits. You can use a small mirror in front and a large mirror behind you to keep an eye on the back side or your body.   If you see any new or changing lesions before your next follow-up, please call to schedule a visit.  Please continue daily skin protection including broad spectrum sunscreen SPF 30+ to  sun-exposed areas, reapplying every 2 hours as needed when you're outdoors.      Due to recent changes in healthcare laws, you may see results of your pathology and/or laboratory studies on MyChart before the doctors have had a chance to review them. We understand that in some cases there may be results that are confusing or concerning to you. Please understand that not all results are received at the same time and often the doctors may need to interpret multiple results in order to provide you with the best plan of care or course of treatment. Therefore, we ask that you please give us  2 business days to thoroughly review all your results before contacting the office for clarification. Should we see a critical lab result, you will be contacted sooner.   If You Need Anything After Your Visit  If you have any questions or concerns for your doctor, please call our main line at [phone] and press option 4 to reach your doctor's medical assistant. If no one answers, please leave a voicemail as directed and we will return your call as soon as possible. Messages left after 4 pm will be answered the following business day.   You may also send us  a message via MyChart. We typically respond to MyChart messages within 1-2 business days.  For prescription refills, please ask your pharmacy to contact our office. Our fax number is [phone].  If you have an urgent issue when the clinic is closed that cannot wait until the next business day, you can page your doctor at the number below.    Please note that while we do our best to be available for urgent issues outside of office hours, we are not available 24/7.   If you have an urgent issue and are unable to reach us , you may choose to seek medical care at your doctor's office, retail clinic, urgent care center, or emergency room.  If you have a medical emergency, please immediately call 911 or go to the emergency department.  Pager Numbers  - Dr.  Hester: [phone]  - Dr. Jackquline: [phone]  - Dr. Claudene: [phone]   - Dr. Raymund: [phone]  In the event of inclement weather, please call our main line at [phone] for an update on the status of any delays or closures.  Dermatology Medication Tips: Please keep the boxes that topical medications come in in order to help keep track of the instructions about where and how to use these. Pharmacies typically print the medication instructions only on the boxes and not directly on the medication tubes.   If your medication is too expensive, please contact our office at [phone] option 4 or send us  a message through MyChart.   We are unable to tell what your co-pay for medications will be in advance as this is different depending on your insurance coverage. However, we may be able to find a substitute medication at lower cost or fill out paperwork to get insurance to cover a needed medication.   If a prior authorization is required to get your medication covered by your insurance company, please allow us  1-2 business days to complete this process.  Drug prices often vary depending on where the prescription is filled and some pharmacies may offer cheaper prices.  The website www.goodrx.com contains coupons for medications through different pharmacies. The prices here do not account for what the cost may be with help from insurance (it may be cheaper with your insurance), but the website can give you the price if you did not use any insurance.  - You can print the associated coupon and take it with your prescription to the pharmacy.  - You may also stop by our office during regular business hours and pick up a GoodRx coupon card.  - If you need your prescription sent electronically to a different pharmacy, notify our office through [facility] or by phone at [phone] option 4.     Si Usted Necesita Algo Despus de Su Visita  Tambin puede enviarnos un mensaje  a travs de Clinical cytogeneticist. Por lo general respondemos a los mensajes de MyChart en el transcurso de 1 a 2 das hbiles.  Para renovar recetas, por favor pida a su farmacia que se ponga en contacto con nuestra oficina. Randi lakes de fax es [place] [phone].  Si tiene un asunto urgente cuando la clnica est cerrada y que no puede esperar hasta el siguiente da hbil, puede llamar/localizar a su doctor(a) al nmero que aparece a continuacin.   Por favor, tenga en cuenta que aunque hacemos todo lo posible para estar disponibles para asuntos urgentes fuera del horario de [place], no estamos disponibles las 24 horas del da, los 7 [address]  [address] de la [place].   Si tiene un problema urgente y no puede comunicarse con nosotros, puede optar por buscar atencin mdica  en el consultorio de su doctor(a), en una clnica privada, en un  centro de atencin urgente o en una sala de emergencias.  Si tiene Engineer, drilling, por favor llame inmediatamente al 911 o vaya a la sala de emergencias.  Nmeros de bper  - Dr. Hester: [phone]  - Dra. Jackquline: [phone]  - Dr. Claudene: [phone]  - Dra. Kitts: [phone]  En caso de inclemencias del [place], por favor llame a nuestra lnea principal al [phone] para una actualizacin sobre el estado de cualquier retraso o cierre.  Consejos para la medicacin en dermatologa: Por favor, guarde las cajas en las que vienen los medicamentos de uso tpico para ayudarle a seguir las instrucciones sobre dnde y cmo usarlos. Las farmacias generalmente imprimen las instrucciones del medicamento slo en las cajas y no directamente en los tubos del [place].   Si su medicamento es muy caro, por favor, pngase en contacto con landry rieger llamando al [phone] y presione la opcin 4 o envenos un mensaje a travs de Clinical cytogeneticist.   No podemos decirle cul ser su copago por los medicamentos por adelantado ya que esto es diferente dependiendo de la cobertura de  su seguro. Sin embargo, es posible que podamos encontrar un medicamento sustituto a Audiological scientist un formulario para que el seguro cubra el medicamento que se considera necesario.   Si se requiere una autorizacin previa para que su compaa de seguros malta su medicamento, por favor permtanos de 1 a 2 das hbiles para completar este proceso.  Los precios de los medicamentos varan con frecuencia dependiendo del Environmental consultant de dnde se surte la receta y alguna farmacias pueden ofrecer precios ms baratos.  El sitio web www.goodrx.com tiene cupones para medicamentos de Health and safety inspector. Los precios aqu no tienen en cuenta lo que podra costar con la ayuda del seguro (puede ser ms barato con su seguro), pero el sitio web puede darle el precio si no utiliz Tourist information centre manager.  - Puede imprimir el cupn correspondiente y llevarlo con su receta a la farmacia.  - Tambin puede pasar por nuestra oficina durante el horario de atencin regular y Education officer, museum una tarjeta de cupones de GoodRx.  - Si necesita que su receta se enve electrnicamente a una farmacia diferente, informe a nuestra oficina a travs de MyChart de [place] o por telfono llamando al [phone] y presione la opcin 4.

## 2024-07-27 ENCOUNTER — Ambulatory Visit: Attending: Physician Assistant

## 2024-07-27 DIAGNOSIS — I3139 Other pericardial effusion (noninflammatory): Secondary | ICD-10-CM | POA: Diagnosis not present

## 2024-07-27 LAB — ECHOCARDIOGRAM COMPLETE
AR max vel: 4.89 cm2
AV Area VTI: 4.76 cm2
AV Area mean vel: 4.58 cm2
AV Mean grad: 4 mmHg
AV Peak grad: 7.4 mmHg
Ao pk vel: 1.36 m/s
Area-P 1/2: 3.6 cm2
S' Lateral: 2.89 cm

## 2024-07-28 ENCOUNTER — Ambulatory Visit: Payer: Self-pay | Admitting: Physician Assistant

## 2024-08-03 ENCOUNTER — Ambulatory Visit: Payer: Self-pay

## 2024-08-03 ENCOUNTER — Ambulatory Visit (INDEPENDENT_AMBULATORY_CARE_PROVIDER_SITE_OTHER)

## 2024-08-03 ENCOUNTER — Encounter

## 2024-08-03 VITALS — BP 120/70 | HR 57 | Temp 98.1°F | Ht 73.0 in | Wt 247.6 lb

## 2024-08-03 DIAGNOSIS — E042 Nontoxic multinodular goiter: Secondary | ICD-10-CM

## 2024-08-03 DIAGNOSIS — E785 Hyperlipidemia, unspecified: Secondary | ICD-10-CM

## 2024-08-03 DIAGNOSIS — E118 Type 2 diabetes mellitus with unspecified complications: Secondary | ICD-10-CM

## 2024-08-03 DIAGNOSIS — I152 Hypertension secondary to endocrine disorders: Secondary | ICD-10-CM | POA: Diagnosis not present

## 2024-08-03 DIAGNOSIS — Z0189 Encounter for other specified special examinations: Secondary | ICD-10-CM

## 2024-08-03 DIAGNOSIS — E1159 Type 2 diabetes mellitus with other circulatory complications: Secondary | ICD-10-CM | POA: Diagnosis not present

## 2024-08-03 DIAGNOSIS — J309 Allergic rhinitis, unspecified: Secondary | ICD-10-CM

## 2024-08-03 DIAGNOSIS — G720 Drug-induced myopathy: Secondary | ICD-10-CM | POA: Diagnosis not present

## 2024-08-03 DIAGNOSIS — R0789 Other chest pain: Secondary | ICD-10-CM

## 2024-08-03 DIAGNOSIS — I8393 Asymptomatic varicose veins of bilateral lower extremities: Secondary | ICD-10-CM

## 2024-08-03 DIAGNOSIS — N1831 Chronic kidney disease, stage 3a: Secondary | ICD-10-CM | POA: Insufficient documentation

## 2024-08-03 DIAGNOSIS — T466X5A Adverse effect of antihyperlipidemic and antiarteriosclerotic drugs, initial encounter: Secondary | ICD-10-CM | POA: Diagnosis not present

## 2024-08-03 DIAGNOSIS — Z7984 Long term (current) use of oral hypoglycemic drugs: Secondary | ICD-10-CM | POA: Diagnosis not present

## 2024-08-03 DIAGNOSIS — G4733 Obstructive sleep apnea (adult) (pediatric): Secondary | ICD-10-CM

## 2024-08-03 DIAGNOSIS — K219 Gastro-esophageal reflux disease without esophagitis: Secondary | ICD-10-CM

## 2024-08-03 DIAGNOSIS — E119 Type 2 diabetes mellitus without complications: Secondary | ICD-10-CM

## 2024-08-03 DIAGNOSIS — I471 Supraventricular tachycardia, unspecified: Secondary | ICD-10-CM

## 2024-08-03 DIAGNOSIS — L570 Actinic keratosis: Secondary | ICD-10-CM

## 2024-08-03 DIAGNOSIS — E1165 Type 2 diabetes mellitus with hyperglycemia: Secondary | ICD-10-CM | POA: Insufficient documentation

## 2024-08-03 DIAGNOSIS — N400 Enlarged prostate without lower urinary tract symptoms: Secondary | ICD-10-CM

## 2024-08-03 LAB — COMPREHENSIVE METABOLIC PANEL WITH GFR
ALT: 33 U/L (ref 0–53)
AST: 22 U/L (ref 0–37)
Albumin: 4.3 g/dL (ref 3.5–5.2)
Alkaline Phosphatase: 40 U/L (ref 39–117)
BUN: 17 mg/dL (ref 6–23)
CO2: 28 meq/L (ref 19–32)
Calcium: 8.8 mg/dL (ref 8.4–10.5)
Chloride: 102 meq/L (ref 96–112)
Creatinine, Ser: 1.24 mg/dL (ref 0.40–1.50)
GFR: 58.3 mL/min — ABNORMAL LOW (ref >60.00)
Glucose, Bld: 178 mg/dL — ABNORMAL HIGH (ref 70–99)
Potassium: 4.4 meq/L (ref 3.5–5.1)
Sodium: 137 meq/L (ref 135–145)
Total Bilirubin: 0.5 mg/dL (ref 0.2–1.2)
Total Protein: 6.3 g/dL (ref 6.0–8.3)

## 2024-08-03 LAB — LIPID PANEL
Cholesterol: 193 mg/dL (ref 0–200)
HDL: 31 mg/dL — ABNORMAL LOW (ref >39.00)
LDL Cholesterol: 114 mg/dL — ABNORMAL HIGH (ref 0–99)
NonHDL: 162.17
Total CHOL/HDL Ratio: 6
Triglycerides: 239 mg/dL — ABNORMAL HIGH (ref 0.0–149.0)
VLDL: 47.8 mg/dL — ABNORMAL HIGH (ref 0.0–40.0)

## 2024-08-03 LAB — HEMOGLOBIN A1C: Hgb A1c MFr Bld: 7.6 % — ABNORMAL HIGH (ref 4.6–6.5)

## 2024-08-03 MED ORDER — FEXOFENADINE HCL 60 MG PO TABS: 60.0000 mg | ORAL_TABLET | Freq: Two times a day (BID) | ORAL | 3 refills | Status: AC | PRN

## 2024-08-03 MED ORDER — METFORMIN HCL 1000 MG PO TABS: 1000.0000 mg | ORAL_TABLET | Freq: Two times a day (BID) | ORAL | 3 refills | Status: AC

## 2024-08-03 MED ORDER — AZELASTINE HCL 0.1 % NA SOLN
1.0000 | Freq: Two times a day (BID) | NASAL | 3 refills | Status: AC | PRN
Start: 2024-08-03 — End: ?

## 2024-08-03 MED ORDER — METFORMIN HCL 1000 MG PO TABS: 1000.0000 mg | ORAL_TABLET | Freq: Every day | ORAL | 3 refills | Status: DC

## 2024-08-03 MED ORDER — FREESTYLE LIBRE 3 SENSOR MISC: 3 refills | Status: DC

## 2024-08-03 MED ORDER — TRULICITY 0.75 MG/0.5ML ~~LOC~~ SOAJ: SUBCUTANEOUS | 11 refills | Status: AC

## 2024-08-03 MED ORDER — METOPROLOL SUCCINATE ER 50 MG PO TB24: ORAL_TABLET | ORAL | 3 refills | Status: AC

## 2024-08-03 NOTE — Assessment & Plan Note (Signed)
 Symptoms stable on prn Azelastine  nasal spray BID and Fexofenadine  60 mg BID when symptoms are worse. Continue. Also discussed using nasal Flonase 2 puffs in each nostril daily as needed as first line treatment and prn addition of Azelastine , Fexofenadine  if symptoms are not improved with nasal Flonase.

## 2024-08-03 NOTE — Assessment & Plan Note (Signed)
 Diabetic foot exam completed today. Normal. Counseled on diabetic foot care.

## 2024-08-03 NOTE — Assessment & Plan Note (Addendum)
 Chronic, complications: intermittent hypoglycemia, hyperlipidemia.  Check A1c, CMP.  Continue Metformin  1000 daily, if A1c above 7% increase it to 1000 mg BID. Continue  Trulicity  0.75mg  weekly, dose not increased due to h/o intermittent hypoglycemia in the past. Recommend CGM to monitor blood glucose, prescription on Libre 3 sensor sent to the pharmacy.  Also recommend patient to update singles, COVID-19, influenza immunization. He plans on updating influenza immunization through local pharmacy and politely declines shingles, covid-19 immunization.  Diabetic foot exam completes.

## 2024-08-03 NOTE — Assessment & Plan Note (Signed)
 BP goal <130/80 mmHg, has a h/o SVT and is on Metoprolol  XL 50 mg once daily, continue.

## 2024-08-03 NOTE — Assessment & Plan Note (Signed)
 Established with Dr. Jess, uses CPAP regularly.

## 2024-08-03 NOTE — Assessment & Plan Note (Signed)
 Urology Dr. Francisca (BPH on Tamsulosin , Finasteride  last visit 09/30/2023, 1 yr follow up recommended. Patient plans on annual f/u with him.

## 2024-08-03 NOTE — Assessment & Plan Note (Signed)
 On Omeprazole  40 mg daily, does not need refill on this.

## 2024-08-03 NOTE — Patient Instructions (Addendum)
 You qualify for shingles, influenza, covid-19 vaccine.  Diet: Emphasize whole grains, lean proteins, fruits, and vegetables. Limit processed foods and sugary drinks. Exercise: Aim for 150 minutes of moderate aerobic activity weekly plus strength training twice a week. Weight Loss: Target 5% body weight loss.

## 2024-08-03 NOTE — Assessment & Plan Note (Signed)
 Dr. Cherilyn  [facility] clinic, multinodular goiter, last visit on 06/29/24, 1 yr follow up recommended. Patient asymptomatic. Continue annual f/u with endocrinologist.

## 2024-08-03 NOTE — Assessment & Plan Note (Signed)
 B/L varicose veins, left slightly worse than right with mild pitting edema b/l noted. Recommend wearing compression stocking 20-25 mmHg pressure, elevating legs, regular exercise and f/u and reevaluation if worsening edema or symptoms.

## 2024-08-03 NOTE — Assessment & Plan Note (Signed)
 The 10-year ASCVD risk score (Arnett DK, et al., 2019) is: 36.6%   Values used to calculate the score:     Age: 73 years     Clincally relevant sex: Male     Is Non-Hispanic African American: No     Diabetic: Yes     Tobacco smoker: No     Systolic Blood Pressure: 120 mmHg     Is BP treated: No     HDL Cholesterol: 31.2 mg/dL     Total Cholesterol: 199 mg/dL Patient previously treated with statin and declines restarting statin despite counseling (increased risk of MI, stroke). Patient prefers not to take statin, verbalizes understanding that not taking statin increases ASCVD risk due to h/o muscle pain, perceived history muscle loss on statin. Repeat lipid panel.

## 2024-08-03 NOTE — Assessment & Plan Note (Signed)
 Plan per hyperlipidemia.

## 2024-08-03 NOTE — Progress Notes (Signed)
 Established Patient Office Visit TOC from Dr. Hope  Dermatologist Dr. Claudene (h/o multiple SK, nevus) Cardiologist: Dr. Perla and  Bernardino Bring, PA  Endocrinologist Dr. Cherilyn  [facility], multinodular goiter, last visit on 06/29/24, 1 yr follow up recommended)  Sleep, OSA (dr. Jess)  Urology Dr. Francisca (BPH on Tamsulosin , Finasteride  last visit 09/30/2023, 1 yr follow up recommended)     Subjective  Patient ID: Jackson [name], male    DOB: 03/10/1951  Age: 73 y.o. MRN: [id number]  Chief Complaint  Patient presents with   Establish Care    He  has a past medical history of Actinic keratosis, Allergy, Anemia, Arthritis, Basal cell carcinoma (11/17/2022), Basal cell carcinoma (02/17/2023), BCC (basal cell carcinoma) (06/03/2022), BCC (basal cell carcinoma) (06/03/2022), Bradycardia (03/03/2018), Cancer (HCC), Complication of anesthesia, Dysplastic nevus (07/05/2023), Elevated PSA (06/13/2019), Fatty liver, GERD (gastroesophageal reflux disease), Hyperlipidemia, Melanoma (HCC) (02/02/2019), Paroxysmal SVT (supraventricular tachycardia) (05/27/2016), Rheumatic fever, Sleep apnea (1997), and Thyroid  nodule.  HPI Discussed the use of AI scribe software for clinical note transcription with the patient, who gave verbal consent to proceed.  History of Present Illness Jackson [name] is a 73 year old male with supraventricular tachycardia and diabetes who presents for a routine follow-up.  He has a history of supraventricular tachycardia (SVT) that began in the early 2000s, with episodes involving his pulse increasing to 190 bpm, notably during a bike ride in 2009. Initially managed with Toprol , starting at 100 mg and increasing to 200 mg, he has since tapered down to 50 mg. No recent episodes of SVT, chest pain, or palpitations are reported, and he no longer engages in heavy exercise like biking.  He has diabetes with an A1c of 7% as of February 2025. Occasional hypoglycemic  episodes occur, with the lowest recorded blood sugar being 45 mg/dL. He is on Trulicity  0.75 mg weekly and metformin  1000 mg daily, with blood sugar levels averaging between 120-130 mg/dL. Both parents had type 2 diabetes.  He experiences seasonal allergies and takes fexofenadine  once daily and azelastine  nasal spray as needed. He has tried [employer] but prefers azelastine  for better symptom control.  He has sleep apnea and has been using a CPAP machine since 1997, reporting 7-8 hours of sleep per night.  He has thyroid  nodules and follows up annually with an endocrinologist.  He takes tamsulosin  and finasteride  for urological issues and sees his urologist annually.  He has high cholesterol but has experienced muscle pain with statins in the past and is not currently on any statin therapy. His LDL was 143 mg/dL at the last check.  He engages in regular physical activity, primarily walking, and does some light weightlifting. His diet includes eggs, chicken, beef, fish, and occasional pizza, with an effort to limit processed foods.  ROS As per HPI    Objective:     BP 120/70 (BP Location: Right Arm, Patient Position: Sitting, Cuff Size: Normal)   Pulse (!) 57   Temp 98.1 F (36.7 C) (Oral)   Ht 6' 1 (1.854 m)   Wt 247 lb 9.6 oz (112.3 kg)   SpO2 98%   BMI 32.67 kg/m      08/03/2024    9:05 AM 12/16/2023    2:41 PM 09/14/2023   11:15 AM  Depression screen PHQ 2/9  Decreased Interest 0 0 0  Down, Depressed, Hopeless 0 0 0  PHQ - 2 Score 0 0 0  Altered sleeping 1 0 2  Tired, decreased energy  1 0 2  Change in appetite 0 0 0  Feeling bad or failure about yourself  0 0 0  Trouble concentrating 0 0 0  Moving slowly or fidgety/restless 0 0 0  Suicidal thoughts 0 0 0  PHQ-9 Score 2 0 4  Difficult doing work/chores Not difficult at all Not difficult at all Not difficult at all      08/03/2024    9:06 AM 12/16/2023    2:41 PM 09/14/2023   11:16 AM 03/19/2020    3:09 PM  GAD 7 :  Generalized Anxiety Score  Nervous, Anxious, on Edge 0 0 0 0  Control/stop worrying 0 0 0 0  Worry too much - different things 0 0 0 0  Trouble relaxing 0 0 1 0  Restless 0 0 0 0  Easily annoyed or irritable 0 0 0 0  Afraid - awful might happen 0 0 0 0  Total GAD 7 Score 0 0 1 0  Anxiety Difficulty Not difficult at all Not difficult at all Not difficult at all Not difficult at all      08/03/2024    9:05 AM 12/16/2023    2:41 PM 09/14/2023   11:15 AM  Depression screen PHQ 2/9  Decreased Interest 0 0 0  Down, Depressed, Hopeless 0 0 0  PHQ - 2 Score 0 0 0  Altered sleeping 1 0 2  Tired, decreased energy 1 0 2  Change in appetite 0 0 0  Feeling bad or failure about yourself  0 0 0  Trouble concentrating 0 0 0  Moving slowly or fidgety/restless 0 0 0  Suicidal thoughts 0 0 0  PHQ-9 Score 2 0 4  Difficult doing work/chores Not difficult at all Not difficult at all Not difficult at all      08/03/2024    9:06 AM 12/16/2023    2:41 PM 09/14/2023   11:16 AM 03/19/2020    3:09 PM  GAD 7 : Generalized Anxiety Score  Nervous, Anxious, on Edge 0 0 0 0  Control/stop worrying 0 0 0 0  Worry too much - different things 0 0 0 0  Trouble relaxing 0 0 1 0  Restless 0 0 0 0  Easily annoyed or irritable 0 0 0 0  Afraid - awful might happen 0 0 0 0  Total GAD 7 Score 0 0 1 0  Anxiety Difficulty Not difficult at all Not difficult at all Not difficult at all Not difficult at all   SDOH Screenings   Food Insecurity: No Food Insecurity (07/31/2024)  Housing: Low Risk  (07/31/2024)  Transportation Needs: No Transportation Needs (07/31/2024)  Utilities: Not At Risk (02/03/2023)  Alcohol Screen: Low Risk  (07/31/2024)  Depression (PHQ2-9): Low Risk  (08/03/2024)  Financial Resource Strain: Low Risk  (07/31/2024)  Physical Activity: Sufficiently Active (07/31/2024)  Social Connections: Moderately Integrated (07/31/2024)  Stress: No Stress Concern Present (07/31/2024)  Tobacco Use: Medium Risk  (08/03/2024)     Physical Exam Constitutional:      Appearance: Normal appearance.  HENT:     Head: Normocephalic and atraumatic.     Right Ear: Tympanic membrane and external ear normal. There is no impacted cerumen.     Left Ear: Tympanic membrane and external ear normal. There is no impacted cerumen.     Mouth/Throat:     Mouth: Mucous membranes are moist.  Neck:     Thyroid : No thyroid  mass or thyroid  tenderness.  Cardiovascular:  Rate and Rhythm: Normal rate and regular rhythm.     Heart sounds: No murmur heard. Pulmonary:     Effort: Pulmonary effort is normal.     Breath sounds: Normal breath sounds. No wheezing.  Abdominal:     General: Abdomen is protuberant. Bowel sounds are normal.     Palpations: Abdomen is soft.     Tenderness: There is no guarding.  Musculoskeletal:     Cervical back: Neck supple. No rigidity.     Right lower leg: Edema present.     Left lower leg: Edema present.     Comments: B/L mild pitting edema and varicose veins noted without ulcerations   Skin:    General: Skin is warm.  Neurological:     Mental Status: He is alert and oriented to person, place, and time.  Psychiatric:        Mood and Affect: Mood normal.        Behavior: Behavior normal.      Diabetic foot exam was performed with the following findings:   No deformities, ulcerations, or other skin breakdown Normal sensation of 10g monofilament Intact posterior tibialis and dorsalis pedis pulses     No results found for any visits on 08/03/24.  The 10-year ASCVD risk score (Arnett DK, et al., 2019) is: 36.6%     Assessment & Plan:   Type II diabetes mellitus with complication [facility]) Assessment & Plan: Chronic, complications: intermittent hypoglycemia, hyperlipidemia.  Check A1c, CMP.  Continue Metformin  1000 daily, if A1c above 7% increase it to 1000 mg BID. Continue  Trulicity  0.75mg  weekly, dose not increased due to h/o intermittent hypoglycemia in the past.  Recommend CGM to monitor blood glucose, prescription on Libre 3 sensor sent to the pharmacy.  Also recommend patient to update singles, COVID-19, influenza immunization. He plans on updating influenza immunization through local pharmacy and politely declines shingles, covid-19 immunization.  Diabetic foot exam completes.   Orders: -     Comprehensive metabolic panel with GFR -     Hemoglobin A1c -     FreeStyle Libre 3 Sensor; Place 1 sensor on the skin every 14 days. Use to check glucose continuously, has a history of hypoglycemia with BG as low as 45 in the past. He is currently on Trulicity  and Metformin  for type II DM  Dispense: 3 each; Refill: 3 -     metFORMIN  HCl; Take 1 tablet (1,000 mg total) by mouth daily.  Dispense: 90 tablet; Refill: 3  Encounter for diabetic foot exam [facility]) Assessment & Plan: Diabetic foot exam completed today. Normal. Counseled on diabetic foot care.      Hyperlipidemia, unspecified hyperlipidemia type Assessment & Plan: The 10-year ASCVD risk score (Arnett DK, et al., 2019) is: 36.6%   Values used to calculate the score:     Age: 3 years     Clincally relevant sex: Male     Is Non-Hispanic African American: No     Diabetic: Yes     Tobacco smoker: No     Systolic Blood Pressure: 120 mmHg     Is BP treated: No     HDL Cholesterol: 31.2 mg/dL     Total Cholesterol: 199 mg/dL Patient previously treated with statin and declines restarting statin despite counseling (increased risk of MI, stroke). Patient prefers not to take statin, verbalizes understanding that not taking statin increases ASCVD risk due to h/o muscle pain, perceived history muscle loss on statin. Repeat lipid panel.   Orders: -  Lipid panel  Statin myopathy Assessment & Plan: Plan per hyperlipidemia    Hypertension associated with diabetes (HCC) Assessment & Plan: BP goal <130/80 mmHg, has a h/o SVT and is on Metoprolol  XL 50 mg once daily, continue.   Orders: -      Metoprolol  Succinate ER; TAKE 1 TABLET DAILY WITH OR IMMEDIATELY FOLLOWING A MEAL  Dispense: 90 tablet; Refill: 3  Allergic rhinitis, unspecified seasonality, unspecified trigger Assessment & Plan: Symptoms stable on prn Azelastine  nasal spray BID and Fexofenadine  60 mg BID when symptoms are worse. Continue. Also discussed using nasal Flonase 2 puffs in each nostril daily as needed as first line treatment and prn addition of Azelastine , Fexofenadine  if symptoms are not improved with nasal Flonase.   Orders: -     Fexofenadine  HCl; Take 1 tablet (60 mg total) by mouth 2 (two) times daily as needed for allergies or rhinitis.  Dispense: 180 tablet; Refill: 3 -     Azelastine  HCl; Place 1 spray into both nostrils 2 (two) times daily as needed for rhinitis. Use in each nostril as directed  Dispense: 90 mL; Refill: 3  AK (actinic keratosis)  Benign prostatic hyperplasia without lower urinary tract symptoms Assessment & Plan: Urology Dr. Francisca (BPH on Tamsulosin , Finasteride  last visit 09/30/2023, 1 yr follow up recommended. Patient plans on annual f/u with him.    Gastroesophageal reflux disease without esophagitis Assessment & Plan: On Omeprazole  40 mg daily, does not need refill on this.    Multinodular goiter Assessment & Plan:  Dr. Cherilyn  [facility] clinic, multinodular goiter, last visit on 06/29/24, 1 yr follow up recommended. Patient asymptomatic. Continue annual f/u with endocrinologist.    Asymptomatic varicose veins of both lower extremities Assessment & Plan: B/L varicose veins, left slightly worse than right with mild pitting edema b/l noted. Recommend wearing compression stocking 20-25 mmHg pressure, elevating legs, regular exercise and f/u and reevaluation if worsening edema or symptoms.    OSA (obstructive sleep apnea) Assessment & Plan: Established with Dr. Jess, uses CPAP regularly.    Other orders -     Trulicity ; INJECT 0.5 ML (0.75 MG TOTAL) UNDER THE SKIN ONCE  A WEEK  Dispense: 6 mL; Refill: 11   I personally spent a total of 45 minutes in the care of the patient today including preparing to see the patient, performing a medically appropriate exam/evaluation, counseling and educating, placing orders, documenting clinical information in the EHR, independently interpreting results, and communicating results.   Return in about 6 months (around 02/01/2025) for chronic follow up, labs 2 days before appointment.   Luke Shade, MD

## 2024-08-04 MED ORDER — ROSUVASTATIN CALCIUM 5 MG PO TABS: 5.0000 mg | ORAL_TABLET | ORAL | 3 refills | Status: AC

## 2024-08-04 NOTE — Telephone Encounter (Signed)
 1. Stage 3a chronic kidney disease (HCC) (Primary) - Basic Metabolic Panel (BMET); Future - Hepatic function panel; Future  2. Type II diabetes mellitus with complication (HCC) - HgB A1c; Future - metFORMIN  (GLUCOPHAGE ) 1000 MG tablet; Take 1 tablet (1,000 mg total) by mouth 2 (two) times daily with a meal.  Dispense: 180 tablet; Refill: 3  3. Hyperlipidemia, unspecified hyperlipidemia type - Hepatic function panel; Future  Luke Shade, MD

## 2024-08-09 ENCOUNTER — Other Ambulatory Visit (HOSPITAL_COMMUNITY): Payer: Self-pay

## 2024-08-09 ENCOUNTER — Telehealth: Payer: Self-pay

## 2024-08-09 DIAGNOSIS — E118 Type 2 diabetes mellitus with unspecified complications: Secondary | ICD-10-CM

## 2024-08-10 NOTE — Telephone Encounter (Signed)
 Pt requesting for [employer]

## 2024-08-16 ENCOUNTER — Other Ambulatory Visit: Payer: Self-pay | Admitting: Urology

## 2024-08-16 DIAGNOSIS — N400 Enlarged prostate without lower urinary tract symptoms: Secondary | ICD-10-CM

## 2024-08-18 ENCOUNTER — Other Ambulatory Visit (HOSPITAL_COMMUNITY): Payer: Self-pay

## 2024-09-04 DIAGNOSIS — E118 Type 2 diabetes mellitus with unspecified complications: Secondary | ICD-10-CM

## 2024-09-05 MED ORDER — FREESTYLE LIBRE 3 SENSOR MISC
3 refills | Status: AC
Start: 2024-09-05 — End: ?

## 2024-09-05 NOTE — Telephone Encounter (Signed)
 Can we please follow up with the pharmacy on metformin  prescription. It was last sent from me on 08/03/24. I sent libre 3 sensor to his preferred pharmacy.   1. Type II diabetes mellitus with complication (HCC) - Continuous Glucose Sensor (FREESTYLE LIBRE 3 SENSOR) MISC; Place 1 sensor on the skin every 14 days. Use to check glucose continuously, has a history of hypoglycemia with BG as low as 45 in the past. He is currently on Trulicity  and Metformin  for type II DM  Dispense: 3 each; Refill: 3  Dywane Peruski, MD

## 2024-09-12 NOTE — Telephone Encounter (Signed)
 open in error

## 2024-09-25 ENCOUNTER — Ambulatory Visit
Admission: RE | Admit: 2024-09-25 | Discharge: 2024-09-25 | Disposition: A | Source: Ambulatory Visit | Attending: Urology

## 2024-09-25 DIAGNOSIS — N1831 Chronic kidney disease, stage 3a: Secondary | ICD-10-CM | POA: Insufficient documentation

## 2024-09-25 DIAGNOSIS — N400 Enlarged prostate without lower urinary tract symptoms: Secondary | ICD-10-CM | POA: Insufficient documentation

## 2024-09-25 DIAGNOSIS — N1330 Unspecified hydronephrosis: Secondary | ICD-10-CM | POA: Diagnosis not present

## 2024-10-03 ENCOUNTER — Ambulatory Visit: Payer: Self-pay | Admitting: Urology

## 2024-10-11 ENCOUNTER — Encounter: Payer: Self-pay | Admitting: Urology

## 2024-10-11 ENCOUNTER — Ambulatory Visit: Admitting: Urology

## 2024-10-11 VITALS — BP 127/82 | HR 84 | Wt 235.0 lb

## 2024-10-11 DIAGNOSIS — N400 Enlarged prostate without lower urinary tract symptoms: Secondary | ICD-10-CM | POA: Diagnosis not present

## 2024-10-11 DIAGNOSIS — R339 Retention of urine, unspecified: Secondary | ICD-10-CM

## 2024-10-11 DIAGNOSIS — N1330 Unspecified hydronephrosis: Secondary | ICD-10-CM | POA: Diagnosis not present

## 2024-10-11 DIAGNOSIS — N320 Bladder-neck obstruction: Secondary | ICD-10-CM

## 2024-10-11 LAB — BLADDER SCAN AMB NON-IMAGING

## 2024-10-11 MED ORDER — TAMSULOSIN HCL 0.4 MG PO CAPS: 0.4000 mg | ORAL_CAPSULE | Freq: Every day | ORAL | 3 refills | Status: AC

## 2024-10-11 MED ORDER — FINASTERIDE 5 MG PO TABS: 5.0000 mg | ORAL_TABLET | Freq: Every day | ORAL | 3 refills | Status: AC

## 2024-10-11 NOTE — Patient Instructions (Signed)
 Please call [phone] or [phone] to schedule your imaging prior to your appointment.

## 2024-10-11 NOTE — Progress Notes (Signed)
° °  10/11/2024 2:41 PM   Jackson [name] [birth year] [id number]  Reason for visit: Follow up BPH, incomplete bladder emptying  History: Long history of chronic incomplete emptying with PVRs ranging from 150ml-400ml.  Minimally symptomatic, has deferred outlet procedures.  Normal renal function Continues on Flomax  and finasteride  PSA screening discontinued per guideline recommendations based on age, last PSA July 2023 normal at 3.49 Prostate measured 80g on ultrasound 2023  Physical Exam: BP 127/82 (BP Location: Left Arm, Patient Position: Sitting, Cuff Size: Large)   Pulse 84   Wt 235 lb (106.6 kg)   SpO2 96%   BMI 31.00 kg/m   Imaging/labs: Renal function stable, creatinine 1.24, eGFR 58 from October 2025, stable over the last few years I personally viewed and interpreted the renal ultrasound from December 2025 showing no hydronephrosis-PVR was documented at but he reports he voided a large volume right after leaving ultrasound  Today: Overall doing well with no significant change in urination.  Occasionally has some urgency, rare urge incontinence, nocturia 0-1 time at night.  Overall not bothered by urinary symptoms PVR today remains elevated at Remains resistant to other evaluation or outlet procedures at this time and would like to continue medication alone  Plan:   BPH/incomplete emptying: Minimally symptomatic, PVRs elevated but renal ultrasound normal and renal function stable.  Defers outlet procedures.  Continue Flomax  and finasteride .  Return precautions discussed including gross hematuria, UTIs, retention, worsening renal function and hydronephrosis. Flomax  and finasteride  refilled, RTC 1 year PVR, renal ultrasound prior   Jackson JAYSON Burnet, MD  [facility] [address], [address] [place], [place] [address] [phone]

## 2024-10-12 ENCOUNTER — Ambulatory Visit: Payer: Self-pay | Admitting: Urology

## 2024-11-24 ENCOUNTER — Other Ambulatory Visit: Payer: Self-pay

## 2024-11-24 DIAGNOSIS — G4733 Obstructive sleep apnea (adult) (pediatric): Secondary | ICD-10-CM

## 2024-11-28 ENCOUNTER — Encounter: Payer: Self-pay | Admitting: Sleep Medicine

## 2024-12-01 ENCOUNTER — Encounter

## 2025-01-18 ENCOUNTER — Ambulatory Visit: Admitting: Dermatology

## 2025-02-01 ENCOUNTER — Other Ambulatory Visit

## 2025-02-05 ENCOUNTER — Ambulatory Visit

## 2025-02-07 ENCOUNTER — Ambulatory Visit

## 2025-10-08 ENCOUNTER — Ambulatory Visit

## 2025-10-11 ENCOUNTER — Ambulatory Visit: Admitting: Urology
